# Patient Record
Sex: Female | Born: 1955
Health system: Southern US, Community
[De-identification: ages and names within clinical notes are randomized; demographics above are authoritative.]

## PROBLEM LIST (undated history)

## (undated) DIAGNOSIS — H409 Unspecified glaucoma: Secondary | ICD-10-CM

## (undated) DIAGNOSIS — E785 Hyperlipidemia, unspecified: Secondary | ICD-10-CM

## (undated) DIAGNOSIS — G4730 Sleep apnea, unspecified: Secondary | ICD-10-CM

## (undated) DIAGNOSIS — M2550 Pain in unspecified joint: Secondary | ICD-10-CM

## (undated) DIAGNOSIS — K219 Gastro-esophageal reflux disease without esophagitis: Secondary | ICD-10-CM

## (undated) DIAGNOSIS — I469 Cardiac arrest, cause unspecified: Secondary | ICD-10-CM

## (undated) DIAGNOSIS — R011 Cardiac murmur, unspecified: Secondary | ICD-10-CM

## (undated) DIAGNOSIS — F329 Major depressive disorder, single episode, unspecified: Secondary | ICD-10-CM

## (undated) DIAGNOSIS — Z9289 Personal history of other medical treatment: Secondary | ICD-10-CM

## (undated) DIAGNOSIS — I509 Heart failure, unspecified: Secondary | ICD-10-CM

## (undated) DIAGNOSIS — M1990 Unspecified osteoarthritis, unspecified site: Secondary | ICD-10-CM

## (undated) DIAGNOSIS — K5900 Constipation, unspecified: Secondary | ICD-10-CM

## (undated) DIAGNOSIS — R002 Palpitations: Secondary | ICD-10-CM

## (undated) DIAGNOSIS — I219 Acute myocardial infarction, unspecified: Secondary | ICD-10-CM

## (undated) DIAGNOSIS — Z8674 Personal history of sudden cardiac arrest: Secondary | ICD-10-CM

## (undated) DIAGNOSIS — F32A Depression, unspecified: Secondary | ICD-10-CM

## (undated) DIAGNOSIS — R609 Edema, unspecified: Secondary | ICD-10-CM

## (undated) DIAGNOSIS — I10 Essential (primary) hypertension: Secondary | ICD-10-CM

## (undated) DIAGNOSIS — M549 Dorsalgia, unspecified: Secondary | ICD-10-CM

## (undated) HISTORY — PX: BACK SURGERY: SHX140

## (undated) HISTORY — DX: Palpitations: R00.2

## (undated) HISTORY — PX: FOOT SURGERY: SHX648

## (undated) HISTORY — DX: Dorsalgia, unspecified: M54.9

## (undated) HISTORY — DX: Edema, unspecified: R60.9

## (undated) HISTORY — DX: Constipation, unspecified: K59.00

## (undated) HISTORY — PX: KNEE SURGERY: SHX244

## (undated) HISTORY — PX: HERNIA REPAIR: SHX51

## (undated) HISTORY — DX: Cardiac arrest, cause unspecified: I46.9

## (undated) HISTORY — DX: Pain in unspecified joint: M25.50

## (undated) HISTORY — DX: Personal history of other medical treatment: Z92.89

## (undated) HISTORY — DX: Heart failure, unspecified: I50.9

## (undated) HISTORY — PX: PARTIAL HYSTERECTOMY: SHX80

---

## 1998-02-01 ENCOUNTER — Encounter: Admission: RE | Admit: 1998-02-01 | Discharge: 1998-02-01 | Payer: Self-pay | Admitting: Hematology and Oncology

## 1998-05-24 ENCOUNTER — Emergency Department (HOSPITAL_COMMUNITY): Admission: EM | Admit: 1998-05-24 | Discharge: 1998-05-24 | Payer: Self-pay | Admitting: Emergency Medicine

## 1999-09-10 ENCOUNTER — Emergency Department (HOSPITAL_COMMUNITY): Admission: EM | Admit: 1999-09-10 | Discharge: 1999-09-10 | Payer: Self-pay | Admitting: Emergency Medicine

## 2004-03-07 ENCOUNTER — Encounter (HOSPITAL_COMMUNITY): Admission: RE | Admit: 2004-03-07 | Discharge: 2004-06-05 | Payer: Self-pay | Admitting: *Deleted

## 2004-04-16 ENCOUNTER — Emergency Department (HOSPITAL_COMMUNITY): Admission: EM | Admit: 2004-04-16 | Discharge: 2004-04-16 | Payer: Self-pay | Admitting: Emergency Medicine

## 2004-05-11 ENCOUNTER — Encounter: Admission: RE | Admit: 2004-05-11 | Discharge: 2004-07-19 | Payer: Self-pay | Admitting: Orthopaedic Surgery

## 2004-06-23 ENCOUNTER — Encounter: Admission: RE | Admit: 2004-06-23 | Discharge: 2004-06-23 | Payer: Self-pay | Admitting: Family Medicine

## 2004-07-04 ENCOUNTER — Encounter: Admission: RE | Admit: 2004-07-04 | Discharge: 2004-07-04 | Payer: Self-pay | Admitting: Gastroenterology

## 2004-07-12 ENCOUNTER — Encounter: Admission: RE | Admit: 2004-07-12 | Discharge: 2004-07-12 | Payer: Self-pay | Admitting: Gastroenterology

## 2004-08-14 ENCOUNTER — Observation Stay (HOSPITAL_COMMUNITY): Admission: RE | Admit: 2004-08-14 | Discharge: 2004-08-15 | Payer: Self-pay | Admitting: General Surgery

## 2004-08-14 ENCOUNTER — Encounter (INDEPENDENT_AMBULATORY_CARE_PROVIDER_SITE_OTHER): Payer: Self-pay | Admitting: *Deleted

## 2004-10-06 ENCOUNTER — Encounter: Admission: RE | Admit: 2004-10-06 | Discharge: 2005-01-04 | Payer: Self-pay | Admitting: Family Medicine

## 2004-11-07 ENCOUNTER — Ambulatory Visit (HOSPITAL_COMMUNITY): Admission: RE | Admit: 2004-11-07 | Discharge: 2004-11-07 | Payer: Self-pay | Admitting: *Deleted

## 2005-01-16 ENCOUNTER — Ambulatory Visit (HOSPITAL_COMMUNITY): Admission: RE | Admit: 2005-01-16 | Discharge: 2005-01-16 | Payer: Self-pay | Admitting: Family Medicine

## 2005-06-07 ENCOUNTER — Encounter: Admission: RE | Admit: 2005-06-07 | Discharge: 2005-06-07 | Payer: Self-pay | Admitting: Family Medicine

## 2005-06-27 ENCOUNTER — Encounter: Admission: RE | Admit: 2005-06-27 | Discharge: 2005-06-27 | Payer: Self-pay | Admitting: Family Medicine

## 2005-08-10 ENCOUNTER — Encounter (HOSPITAL_COMMUNITY): Admission: RE | Admit: 2005-08-10 | Discharge: 2005-08-30 | Payer: Self-pay | Admitting: *Deleted

## 2005-08-15 ENCOUNTER — Encounter: Admission: RE | Admit: 2005-08-15 | Discharge: 2005-08-15 | Payer: Self-pay | Admitting: *Deleted

## 2005-08-20 ENCOUNTER — Ambulatory Visit (HOSPITAL_COMMUNITY): Admission: RE | Admit: 2005-08-20 | Discharge: 2005-08-21 | Payer: Self-pay | Admitting: *Deleted

## 2006-04-25 ENCOUNTER — Encounter (INDEPENDENT_AMBULATORY_CARE_PROVIDER_SITE_OTHER): Payer: Self-pay | Admitting: Specialist

## 2006-04-25 ENCOUNTER — Ambulatory Visit (HOSPITAL_COMMUNITY): Admission: RE | Admit: 2006-04-25 | Discharge: 2006-04-25 | Payer: Self-pay | Admitting: Gastroenterology

## 2006-09-19 ENCOUNTER — Encounter: Admission: RE | Admit: 2006-09-19 | Discharge: 2006-09-19 | Payer: Self-pay | Admitting: Family Medicine

## 2008-02-05 ENCOUNTER — Ambulatory Visit (HOSPITAL_COMMUNITY): Admission: RE | Admit: 2008-02-05 | Discharge: 2008-02-05 | Payer: Self-pay | Admitting: Family Medicine

## 2008-02-05 ENCOUNTER — Encounter (INDEPENDENT_AMBULATORY_CARE_PROVIDER_SITE_OTHER): Payer: Self-pay | Admitting: Family Medicine

## 2008-02-05 ENCOUNTER — Ambulatory Visit: Payer: Self-pay | Admitting: Vascular Surgery

## 2008-09-04 ENCOUNTER — Emergency Department (HOSPITAL_COMMUNITY): Admission: EM | Admit: 2008-09-04 | Discharge: 2008-09-04 | Payer: Self-pay | Admitting: Family Medicine

## 2009-04-22 ENCOUNTER — Emergency Department (HOSPITAL_COMMUNITY): Admission: EM | Admit: 2009-04-22 | Discharge: 2009-04-23 | Payer: Self-pay | Admitting: Emergency Medicine

## 2009-09-02 ENCOUNTER — Emergency Department (HOSPITAL_COMMUNITY): Admission: EM | Admit: 2009-09-02 | Discharge: 2009-09-02 | Payer: Self-pay | Admitting: Emergency Medicine

## 2010-10-21 ENCOUNTER — Encounter: Payer: Self-pay | Admitting: *Deleted

## 2011-01-07 LAB — POCT CARDIAC MARKERS
CKMB, poc: 1.1 ng/mL (ref 1.0–8.0)
CKMB, poc: <1 ng/mL — ABNORMAL LOW (ref 1.0–8.0)
Myoglobin, poc: 22.4 ng/mL (ref 12–200)
Myoglobin, poc: 33.6 ng/mL (ref 12–200)
Troponin i, poc: <0.05 ng/mL (ref 0.00–0.09)
Troponin i, poc: <0.05 ng/mL (ref 0.00–0.09)

## 2011-01-07 LAB — POCT I-STAT, CHEM 8
BUN: 10 mg/dL (ref 6–23)
Calcium, Ion: 1.15 mmol/L (ref 1.12–1.32)
Chloride: 105 mEq/L (ref 96–112)
Creatinine, Ser: 0.5 mg/dL (ref 0.4–1.2)
Glucose, Bld: 149 mg/dL — ABNORMAL HIGH (ref 70–99)
HCT: 38 % (ref 36.0–46.0)
Hemoglobin: 12.9 g/dL (ref 12.0–15.0)
Potassium: 3.7 mEq/L (ref 3.5–5.1)
Sodium: 139 mEq/L (ref 135–145)
TCO2: 26 mmol/L (ref 0–100)

## 2011-02-16 NOTE — Op Note (Signed)
Whitney [name], Whitney [name]                ACCOUNT NO.:  [id number]   MEDICAL RECORD NO.:  [id number]          PATIENT TYPE:  AMB   LOCATION:  ENDO                         FACILITY:  [facility]   PHYSICIAN:  Petra Kuba, M.D.    DATE OF BIRTH:  24-Nov-1955   DATE OF PROCEDURE:  04/25/2006  DATE OF DISCHARGE:                                 OPERATIVE REPORT   PROCEDURE:  Colonoscopy with biopsy.   INDICATIONS:  Patient due for colonic screening.  Abdominal pain,  questionable etiology.  Mild anemia.  Consent was signed after risks,  benefits, methods, options were thoroughly discussed in the office on  multiple occasions.   MEDICINES USED PER ANESTHESIA:  1.  Diprivan in low dose.  2.  Fentanyl.  3.  Versed.   PROCEDURE:  Rectal inspection was pertinent for external hemorrhoids, small.  Digital exam was negative.  Video colonoscope was inserted, easily advanced  around the colon to the cecum.  This did not require any abdominal pressure  or any position changes.  No obvious abnormality was seen on insertion.  The  cecum was identified by the appendiceal orifice and the ileocecal valve.  In  fact, the scope was inserted a short ways in the terminal ileum, which was  normal.  Photodocumentation was obtained.  The scope was slowly withdrawn.  Prep was adequate.  There was some liquid stool that required suctioning  only.  On slow withdraw through the colon, other than a tiny descending  polyp which was cold biopsied x2 and put in the first container, and another  tiny polyp seen on retroflexion which was cold biopsied x1 and put in a  separate container, and the internal hemorrhoids seen on retroflexion, no  other abnormalities were seen.  Anorectal pullthrough and retroflexion did  confirm the small hemorrhoids.  The scope was straightened, readvanced a  short ways up the left side of the colon, air was suctioned, and the scope  removed.  The patient tolerated the procedure well.  There was no  obvious  immediate complication.   ENDOSCOPIC FINDINGS AND ASSESSMENT:  1.  Internal and external hemorrhoids, small.  2.  Two tiny polyps, cold biopsied, one in the rectum on retroflexion, one      in the descending mid.  3.  Otherwise within normal limits to the terminal ileum.   PLAN:  Await pathology to determine future colonic screening.  Continue  workup with a one-time EGD.  Please see that dictation for further workup,  recommendation, and plans.           ______________________________  Petra Kuba, M.D.     MEM/MEDQ  D:  04/25/2006  T:  04/25/2006  Job:  [id number]   cc:   Duncan Dull, M.D.  Fax: [phone]   Meade Maw, M.D.  Fax: [phone]

## 2011-02-16 NOTE — Op Note (Signed)
NAMELILLI, [name] NO.:  [id number]   MEDICAL RECORD NO.:  [id number]          PATIENT TYPE:  OBV   LOCATION:  0457                         [facility]ACILITY:  [facility]   PHYSICIAN:  Anselm Pancoast. Weatherly, M.D.DATE OF BIRTH:  December 11, 1955   DATE OF PROCEDURE:  08/14/2004  DATE OF DISCHARGE:                                 OPERATIVE REPORT   PREOPERATIVE DIAGNOSES:  1.  Umbilical hernia.  2.  Exogenous obesity.  3.  History of mechanical back problems.   POSTOPERATIVE DIAGNOSES:  1.  Umbilical hernia.  2.  Exogenous obesity.  3.  History of mechanical back problems.   PROCEDURE:  Repair of umbilical hernia.   ANESTHESIA:  General.   SURGEON:  Anselm Pancoast. Zachery Dakins, M.D.   ASSISTANT:  Gita Kudo, M.D.   INDICATIONS FOR PROCEDURE:  Whitney [name] is a 55 year old extremely  overweight female who weighs about 350 and has had an abdominal hysterectomy  through a  lower midline incision years ago but has an umbilical hernia that  is becoming more symptomatic and is not reducible.  I saw her in the office,  and she is medically disabled because of her obesity and back problems and  has limited activity.  I could not reduce the hernia.  Preoperatively her  lab studies are unremarkable.  Her O2 is at about 90% on room air.  Her EKG  and chest x-rays were unremarkable.  She does have a problem with sleep  apnea secondary to her obesity.  I recommended that we repair the hernia and  possibly use mesh which will be decided at surgery.   DESCRIPTION OF PROCEDURE:  Preoperatively, she was taken to the operative  suite for induction of general anesthesia and endotracheal tube.  PAS  stockings were not placed.  A Foley catheter.  The abdomen was then prepped  with Betadine surgical solution and draped in a sterile manner.   I opened up the top part of her lower midline incision and extended the  little curved portion around to the left and then above the umbilicus, and  then with the __________ for retraction, dissected out the hernia sac  circumferentially.  The hernia sac was about the size of an orange, but  fortunately, the fascia defect was only about a 50 cent size.  We basically  opened the hernia sac and freed up the chronic adhesions that were  preventing it from being reducible.  The little pedicles were ligated with 2-  0 Vicryl, and we then reduced the fatty contents and then basically closed  the peritoneum.  There was a little area that was incarcerated up just above  the fascia to the left that was probably what was giving her the discomfort,  and this was freed.  I then elected not to put the mesh in but just  basically closed with large #1 Novofil sutures, taking big bites starting  lateral on both sides.  About eight or nine sutures all total were placed.  Since the defect was not all that large, the fascia came together without  any excessive  tension.  The subcutaneous tissue was thoroughly irrigated.  I  placed a little Betadine solution in it and washed this out and then  actually closed the subcutaneous tissue vertically with 3-0 Vicryl and then  closed the skin with staples.   The patient tolerated the procedure nicely.  I did put about 15 cc of  Marcaine in the fascia immediately to hopefully minimize the postoperative  pain.  We will have her up in a chair this afternoon in the PAS stockings,  and hopefully she will be able to void in the morning after removal of the  Foley and then hopefully discharged tomorrow.  I am planning on giving her  another two doses of antibiotics, as the deep umbilicus possibly was not  cleansed as well as possible because of its depth.  A sterile occlusive  dressing was applied.      WJW/MEDQ  D:  08/14/2004  T:  08/14/2004  Job:  [id number]   cc:   Duncan Dull, M.D.  [address]  [place]  [place] [address]  Fax: [phone]

## 2011-02-16 NOTE — Op Note (Signed)
Whitney [name], Whitney [name]                ACCOUNT NO.:  [id number]   MEDICAL RECORD NO.:  [id number]          PATIENT TYPE:  AMB   LOCATION:  ENDO                         [facility]AC[facility]   PHYSICIAN:  Petra Kuba, M.D.    DATE OF BIRTH:  Jun 16, 1956   DATE OF PROCEDURE:  04/25/2006  DATE OF DISCHARGE:                                 OPERATIVE REPORT   PROCEDURE:  Esophagogastroduodenoscopy.   INDICATIONS:  Abdominal pain and anemia, nondiagnostic colonoscopy. Consent  was signed after risks, benefits, methods and options were thoroughly  discussed in the office and prior to any premeds given.   MEDICATIONS USE PER ANESTHESIA:  Diprivan, low dose fentanyl and Versed.   DESCRIPTION OF PROCEDURE:  The video endoscope was inserted by direct  vision.  Esophagus was normal.  She did have a small hiatal hernia with a  widely patent fibrous ring.  Scope passed into the stomach and advanced  through normal antrum, normal pylorus into a normal duodenal bulb and around  the C loop to normal second portion of the duodenum.  Scope was withdrawn  back into the bulb and a good look there ruled out abnormalities in that  location.  Scope was withdrawn back into the stomach and retroflexed.  Cardia, fundus, angularis, lesser and greater curve were evaluated on  retroflexed and then straight visualization without any obvious  abnormalities seen.  Air was suctioned, scope was slowly withdrawn again, a  good look at the esophagus was normal.  Scope was removed.  The patient  tolerated the procedure well.  There were no obvious immediate  complications.   ENDOSCOPIC DIAGNOSES:  1.  Small hiatal hernia with a widely patent ring.  2.  Otherwise normal esophagogastroduodenoscopy.   PLAN:  Per Dr. Kevan Ny.  Consider CAT scan or small bowel series to continue  workup if pain continues.  Happy to see back p.r.n..  Otherwise, see colon  dictation for other recommendations, workup and plans.     ______________________________  Petra Kuba, M.D.     MEM/MEDQ  D:  04/25/2006  T:  04/25/2006  Job:  [id number]   cc:   Duncan Dull, M.D.  Fax: [phone]   Meade Maw, M.D.  Fax: [phone]

## 2011-02-16 NOTE — Cardiovascular Report (Signed)
Whitney, [name] NO.:  [id number]   MEDICAL RECORD NO.:  [id number]          PATIENT TYPE:  OIB   LOCATION:  6524                         FACILITY:  [facility]   PHYSICIAN:  Meade Maw, M.D.    DATE OF BIRTH:  [birth year]   DATE OF PROCEDURE:  08/20/2005  DATE OF DISCHARGE:                              CARDIAC CATHETERIZATION   REFERRING PHYSICIAN:  [facility].   INDICATIONS FOR PROCEDURE:  Ongoing chest pain.   HISTORY:  Whitney [name] is a very morbidly obese female who has been difficult to  obtain a noninvasive study. Recent stress Cardiolite demonstrating possible  anterior ischemia versus breast artifact.   PROCEDURE:  After obtaining written informed consent, the patient was  brought to the cardiac catheterization lab in the post absorptive state.  Preoperative sedation was achieved using Versed 2 milligrams IV. The patient  was also given Solu-Medrol 125 milligrams IV x1 and Benadryl 25 milligrams  p.o. for possible IV contrast allergies.   The right groin was prepped and draped in the usual sterile fashion. Local  anesthesia was achieved using 1% Xylocaine. A 6-French hemostasis sheath was  placed into the right femoral artery using modified Seldinger technique.  Selective coronary angiography was performed using JL-4, JR-4 Judkins  catheter. All catheter exchanges were made over guidewire. The hemostasis  sheath was flushed following each catheter exchange. Of note, a Smart needle  was used to obtain access into the right femoral artery. There was no  immediate complications. The patient was given labetalol 10 milligrams IV x2  for blood pressure control.   FINDINGS:  The aortic pressure was 186/90, LV pressure is 188/9. The EDP was  12. Single-plane ventriculogram revealed mild hypokinesis, ejection fraction  of 50-55%.   CORONARY ANGIOGRAPHY:  1.  Left main coronary artery bifurcates into the left anterior descending      and  circumflex vessel. There was no disease noted in the left main      coronary artery.  2.  Left anterior descending:  Left anterior descending gives rise to a      small to moderate D1, small D2, large D3. It goes on to end as an apical      branch. There was no disease noted in the left anterior descending or      its branches.  3.  Circumflex vessel:  Circumflex vessel was large-caliber vessel giving      rise to a large OM-1 and a large OM-2. There was no disease noted in the      circumflex or its branches.  4.  The right coronary artery:  The right coronary artery is a large artery      dominant for the posterior circulation. It gives rise to two RV      marginals, PDA and PL branch. There is no significant disease noted in      the right coronary artery or its branches.   FINAL IMPRESSION:  Normal coronary angiography and normal single-plane  ventriculogram.   RECOMMENDATIONS:  To consider other etiologies for her chest pain.  Meade Maw, M.D.  Electronically Signed     HP/MEDQ  D:  08/20/2005  T:  08/21/2005  Job:  [id number]   cc:   Duncan Dull, M.D.  Fax: [phone]

## 2011-02-16 NOTE — H&P (Signed)
NAMEIMONI, [name]:  [id number]   MEDICAL RECORD NO.:  [id number]          PATIENT TYPE:  OBV   LOCATION:  0098                         [facility]ACILITY:  [facility]   PHYSICIAN:  Anselm Pancoast. Weatherly, M.D.DATE OF BIRTH:  [birth year]   DATE OF ADMISSION:  08/14/2004  DATE OF DISCHARGE:                                HISTORY & PHYSICAL   CHIEF COMPLAINT:  Umbilical hernia.   HISTORY OF PRESENT ILLNESS:  Whitney [name] is a 55 year old medically  disabled because of obesity and back problems female who was referred to our  office by Shaune Pollack for a symptomatic umbilical hernia.  The patient has  had a previous lower abdominal incision for hysterectomy years ago and has  had previous back surgeries for herniated disks.  She has a history of  hypertension.  She used to work in data entry but would have episodes of  kind of falling asleep which was thought to be related to sleep apnea.  For  this reason, her back problems and knees, she was medically disable.  She  has tried to loose weight.  She possibly has lost about 20 pounds.   CURRENT MEDICATIONS:  1.  Lipitor 10 mg a day.  2.  Reserpine 1 mg a day.  3.  Hydrochlorothiazide 25 mg.  4.  Protonix 50 mg.  5.  Baby aspirin.   FAMILY HISTORY:  Her father is deceased.  Her mother is living with diabetes  and is also overweight.  She has brothers and sisters who have had strokes  and cardiac problems and then one sister who is living and in good health.   SOCIAL HISTORY:  The patient is separated from her husband.  She has no  children.  She is actually living now with her mother.   REVIEW OF SYSTEMS:  Please refer to the history and physical from the office  on the nurse's records for other review of symptoms.   ALLERGIES:  NONE NOTED.   PHYSICAL EXAMINATION:  GENERAL:  She is a pleasant but extremely overweight  female, about 5 feet 3 inches, in no acute distress.  VITAL SIGNS:  Blood pressure today is 146/63, 5  feet 3 inches, weight 320  pounds, heart rate 77, temperature 97.2, respirations 20.  HEENT:  No acute problems, well-hydrated.  NECK:  No cervical or supraclavicular adenopathy.  LUNGS:  Good breath sounds bilaterally.  CARDIAC:  Normal sinus rhythm.  BREASTS:  Negative.  ABDOMEN:  Very protuberant abdomen.  Lower midline incision.  About an  orange-size mass at the umbilicus that protrudes when she strains.  Rectal  examination was unremarkable in the office.  EXTREMITIES:  Extremely overweight but no evidence of any obvious pedal  edema or tenderness in the legs.  BACK:  I did not do a complete neurologic exam.  She is ambulatory.  Whether  it is her back or her weight that makes her limited to activity is in  question.  SKIN:  Unremarkable.   ADMISSION IMPRESSION:  1.  Umbilical hernia.  2.  Obesity.  3.  History of hypertension.  4.  Mechanical back problems.   PLAN:  The patient will have repair of this umbilical hernia under general  anesthesia and will be kept overnight because of her sleep apnea, etc., but  hopefully she can be released in the morning.  We will resume her blood  pressure medications postoperatively.      WJW/MEDQ  D:  08/14/2004  T:  08/14/2004  Job:  [id number]

## 2011-07-06 LAB — POCT INFECTIOUS MONO SCREEN: Mono Screen: NEGATIVE

## 2011-10-02 DIAGNOSIS — I219 Acute myocardial infarction, unspecified: Secondary | ICD-10-CM

## 2011-10-02 HISTORY — DX: Acute myocardial infarction, unspecified: I21.9

## 2011-10-25 DIAGNOSIS — I8310 Varicose veins of unspecified lower extremity with inflammation: Secondary | ICD-10-CM | POA: Diagnosis not present

## 2011-11-02 DIAGNOSIS — Z8674 Personal history of sudden cardiac arrest: Secondary | ICD-10-CM

## 2011-11-02 DIAGNOSIS — H4011X0 Primary open-angle glaucoma, stage unspecified: Secondary | ICD-10-CM | POA: Diagnosis not present

## 2011-11-02 DIAGNOSIS — H409 Unspecified glaucoma: Secondary | ICD-10-CM | POA: Diagnosis not present

## 2011-11-02 HISTORY — DX: Personal history of sudden cardiac arrest: Z86.74

## 2011-11-05 ENCOUNTER — Inpatient Hospital Stay (HOSPITAL_COMMUNITY)
Admission: EM | Admit: 2011-11-05 | Discharge: 2011-11-15 | DRG: 208 | Disposition: A | Discharge status: Other Acute Inpatient Hospital | Payer: Medicare Other | Attending: Internal Medicine | Admitting: Internal Medicine

## 2011-11-05 ENCOUNTER — Emergency Department (HOSPITAL_COMMUNITY): Payer: Medicare Other

## 2011-11-05 ENCOUNTER — Other Ambulatory Visit: Payer: Self-pay

## 2011-11-05 ENCOUNTER — Encounter (HOSPITAL_COMMUNITY)
Admission: EM | Disposition: A | Discharge status: Nursing Home (Includes ICF) | Payer: Self-pay | Source: Home / Self Care | Attending: Pulmonary Disease

## 2011-11-05 ENCOUNTER — Ambulatory Visit (HOSPITAL_COMMUNITY): Admit: 2011-11-05 | Payer: Self-pay | Admitting: Cardiology

## 2011-11-05 ENCOUNTER — Encounter (HOSPITAL_COMMUNITY): Payer: Self-pay | Admitting: *Deleted

## 2011-11-05 DIAGNOSIS — E872 Acidosis, unspecified: Secondary | ICD-10-CM | POA: Diagnosis not present

## 2011-11-05 DIAGNOSIS — D72829 Elevated white blood cell count, unspecified: Secondary | ICD-10-CM

## 2011-11-05 DIAGNOSIS — R079 Chest pain, unspecified: Secondary | ICD-10-CM | POA: Diagnosis not present

## 2011-11-05 DIAGNOSIS — E876 Hypokalemia: Secondary | ICD-10-CM | POA: Diagnosis not present

## 2011-11-05 DIAGNOSIS — IMO0001 Reserved for inherently not codable concepts without codable children: Secondary | ICD-10-CM | POA: Diagnosis present

## 2011-11-05 DIAGNOSIS — L89899 Pressure ulcer of other site, unspecified stage: Secondary | ICD-10-CM | POA: Diagnosis present

## 2011-11-05 DIAGNOSIS — I469 Cardiac arrest, cause unspecified: Secondary | ICD-10-CM | POA: Diagnosis not present

## 2011-11-05 DIAGNOSIS — L8992 Pressure ulcer of unspecified site, stage 2: Secondary | ICD-10-CM | POA: Diagnosis present

## 2011-11-05 DIAGNOSIS — IMO0002 Reserved for concepts with insufficient information to code with codable children: Secondary | ICD-10-CM | POA: Diagnosis present

## 2011-11-05 DIAGNOSIS — I5023 Acute on chronic systolic (congestive) heart failure: Secondary | ICD-10-CM | POA: Diagnosis present

## 2011-11-05 DIAGNOSIS — R0902 Hypoxemia: Secondary | ICD-10-CM | POA: Diagnosis present

## 2011-11-05 DIAGNOSIS — R5381 Other malaise: Secondary | ICD-10-CM | POA: Diagnosis not present

## 2011-11-05 DIAGNOSIS — Z09 Encounter for follow-up examination after completed treatment for conditions other than malignant neoplasm: Secondary | ICD-10-CM | POA: Diagnosis not present

## 2011-11-05 DIAGNOSIS — K7200 Acute and subacute hepatic failure without coma: Secondary | ICD-10-CM | POA: Diagnosis not present

## 2011-11-05 DIAGNOSIS — L89009 Pressure ulcer of unspecified elbow, unspecified stage: Secondary | ICD-10-CM | POA: Diagnosis present

## 2011-11-05 DIAGNOSIS — I2789 Other specified pulmonary heart diseases: Secondary | ICD-10-CM | POA: Diagnosis present

## 2011-11-05 DIAGNOSIS — E662 Morbid (severe) obesity with alveolar hypoventilation: Secondary | ICD-10-CM | POA: Diagnosis present

## 2011-11-05 DIAGNOSIS — J810 Acute pulmonary edema: Secondary | ICD-10-CM | POA: Diagnosis not present

## 2011-11-05 DIAGNOSIS — L8991 Pressure ulcer of unspecified site, stage 1: Secondary | ICD-10-CM | POA: Diagnosis present

## 2011-11-05 DIAGNOSIS — I10 Essential (primary) hypertension: Secondary | ICD-10-CM | POA: Diagnosis present

## 2011-11-05 DIAGNOSIS — G4733 Obstructive sleep apnea (adult) (pediatric): Secondary | ICD-10-CM | POA: Diagnosis present

## 2011-11-05 DIAGNOSIS — G9349 Other encephalopathy: Secondary | ICD-10-CM | POA: Diagnosis present

## 2011-11-05 DIAGNOSIS — J90 Pleural effusion, not elsewhere classified: Secondary | ICD-10-CM | POA: Diagnosis not present

## 2011-11-05 DIAGNOSIS — J9692 Respiratory failure, unspecified with hypercapnia: Secondary | ICD-10-CM | POA: Diagnosis present

## 2011-11-05 DIAGNOSIS — Z7982 Long term (current) use of aspirin: Secondary | ICD-10-CM

## 2011-11-05 DIAGNOSIS — Z79899 Other long term (current) drug therapy: Secondary | ICD-10-CM

## 2011-11-05 DIAGNOSIS — R918 Other nonspecific abnormal finding of lung field: Secondary | ICD-10-CM | POA: Diagnosis not present

## 2011-11-05 DIAGNOSIS — J9620 Acute and chronic respiratory failure, unspecified whether with hypoxia or hypercapnia: Principal | ICD-10-CM | POA: Diagnosis present

## 2011-11-05 DIAGNOSIS — E1165 Type 2 diabetes mellitus with hyperglycemia: Secondary | ICD-10-CM | POA: Diagnosis present

## 2011-11-05 DIAGNOSIS — Z6841 Body Mass Index (BMI) 40.0 and over, adult: Secondary | ICD-10-CM

## 2011-11-05 DIAGNOSIS — I509 Heart failure, unspecified: Secondary | ICD-10-CM | POA: Diagnosis present

## 2011-11-05 HISTORY — DX: Essential (primary) hypertension: I10

## 2011-11-05 LAB — POCT I-STAT, CHEM 8
BUN: 15 mg/dL (ref 6–23)
Calcium, Ion: 1.26 mmol/L (ref 1.12–1.32)
Creatinine, Ser: 0.6 mg/dL (ref 0.50–1.10)
Glucose, Bld: 369 mg/dL — ABNORMAL HIGH (ref 70–99)
Glucose, Bld: 388 mg/dL — ABNORMAL HIGH (ref 70–99)
HCT: 45 % (ref 36.0–46.0)
Hemoglobin: 15.3 g/dL — ABNORMAL HIGH (ref 12.0–15.0)
Hemoglobin: 15.6 g/dL — ABNORMAL HIGH (ref 12.0–15.0)
Potassium: 3.8 mEq/L (ref 3.5–5.1)
TCO2: 22 mmol/L (ref 0–100)

## 2011-11-05 LAB — URINALYSIS, ROUTINE W REFLEX MICROSCOPIC
Bilirubin Urine: NEGATIVE
Ketones, ur: NEGATIVE mg/dL
Nitrite: NEGATIVE
Specific Gravity, Urine: 1.021 (ref 1.005–1.030)
Urobilinogen, UA: 1 mg/dL (ref 0.0–1.0)

## 2011-11-05 LAB — POCT I-STAT 3, ART BLOOD GAS (G3+)
Acid-base deficit: 18 mmol/L — ABNORMAL HIGH (ref 0.0–2.0)
Patient temperature: 98

## 2011-11-05 LAB — CBC
HCT: 34.7 % — ABNORMAL LOW (ref 36.0–46.0)
Hemoglobin: 11.2 g/dL — ABNORMAL LOW (ref 12.0–15.0)
MCV: 81.8 fL (ref 78.0–100.0)
WBC: 14.9 10*3/uL — ABNORMAL HIGH (ref 4.0–10.5)

## 2011-11-05 LAB — COMPREHENSIVE METABOLIC PANEL
AST: 45 U/L — ABNORMAL HIGH (ref 0–37)
BUN: 14 mg/dL (ref 6–23)
CO2: 29 mEq/L (ref 19–32)
Chloride: 105 mEq/L (ref 96–112)
Creatinine, Ser: 0.52 mg/dL (ref 0.50–1.10)
GFR calc Af Amer: >90 mL/min (ref >90)
GFR calc non Af Amer: >90 mL/min (ref >90)
Glucose, Bld: 201 mg/dL — ABNORMAL HIGH (ref 70–99)
Total Bilirubin: 0.8 mg/dL (ref 0.3–1.2)

## 2011-11-05 LAB — DIFFERENTIAL
Basophils Absolute: 0 10*3/uL (ref 0.0–0.1)
Lymphocytes Relative: 15 % (ref 12–46)
Lymphs Abs: 2.2 10*3/uL (ref 0.7–4.0)
Monocytes Absolute: 0.8 10*3/uL (ref 0.1–1.0)
Monocytes Relative: 5 % (ref 3–12)
Neutro Abs: 11.8 10*3/uL — ABNORMAL HIGH (ref 1.7–7.7)

## 2011-11-05 LAB — POCT I-STAT TROPONIN I: Troponin i, poc: 0.05 ng/mL (ref 0.00–0.08)

## 2011-11-05 LAB — CK TOTAL AND CKMB (NOT AT ARMC): CK, MB: 1.7 ng/mL (ref 0.3–4.0)

## 2011-11-05 LAB — APTT: aPTT: 34 seconds (ref 24–37)

## 2011-11-05 SURGERY — LEFT HEART CATHETERIZATION WITH CORONARY ANGIOGRAM
Anesthesia: LOCAL

## 2011-11-05 MED ORDER — IOHEXOL 300 MG/ML  SOLN
100.0000 mL | Freq: Once | INTRAMUSCULAR | Status: AC | PRN
  Administered 2011-11-05: 100 mL via INTRAVENOUS

## 2011-11-05 MED ORDER — FUROSEMIDE 10 MG/ML IJ SOLN
80.0000 mg | Freq: Once | INTRAMUSCULAR | Status: AC
  Administered 2011-11-05: 40 mg via INTRAVENOUS

## 2011-11-05 MED ORDER — ASPIRIN 300 MG RE SUPP
300.0000 mg | Freq: Once | RECTAL | Status: AC
  Administered 2011-11-05: 300 mg via RECTAL

## 2011-11-05 MED ORDER — VECURONIUM BROMIDE 10 MG IV SOLR
INTRAVENOUS | Status: AC
  Filled 2011-11-05: qty 10

## 2011-11-05 MED ORDER — MIDAZOLAM HCL 5 MG/ML IJ SOLN
1.0000 mg/h | INTRAMUSCULAR | Status: DC
  Administered 2011-11-05: 2 mg/h via INTRAVENOUS
  Filled 2011-11-05: qty 10

## 2011-11-05 MED ORDER — NITROGLYCERIN IN D5W 200-5 MCG/ML-% IV SOLN
INTRAVENOUS | Status: AC
  Administered 2011-11-05: 10 ug/min
  Filled 2011-11-05: qty 250

## 2011-11-05 MED ORDER — FUROSEMIDE 10 MG/ML IJ SOLN
INTRAMUSCULAR | Status: AC
  Administered 2011-11-05: 40 mg via INTRAVENOUS
  Filled 2011-11-05: qty 8

## 2011-11-05 MED ORDER — MIDAZOLAM HCL 2 MG/2ML IJ SOLN: 2.0000 mg | Freq: Once | INTRAMUSCULAR | Status: DC

## 2011-11-05 MED ORDER — FENTANYL CITRATE 0.05 MG/ML IJ SOLN: 50.0000 ug | INTRAMUSCULAR | Status: DC | PRN

## 2011-11-05 MED ORDER — FUROSEMIDE 10 MG/ML IJ SOLN
40.0000 mg | Freq: Once | INTRAMUSCULAR | Status: AC
  Administered 2011-11-05: 40 mg via INTRAVENOUS

## 2011-11-05 MED ORDER — ASPIRIN 300 MG RE SUPP
325.0000 mg | Freq: Once | RECTAL | Status: DC
  Filled 2011-11-05: qty 1

## 2011-11-05 MED ORDER — EPINEPHRINE HCL 0.1 MG/ML IJ SOLN
INTRAMUSCULAR | Status: DC | PRN
  Administered 2011-11-05 (×2): 1 mg via INTRAVENOUS

## 2011-11-05 MED ORDER — FENTANYL CITRATE 0.05 MG/ML IJ SOLN
10.0000 ug/h | INTRAMUSCULAR | Status: DC
  Administered 2011-11-05: 40 ug/h via INTRAVENOUS
  Filled 2011-11-05: qty 50

## 2011-11-05 MED ORDER — MIDAZOLAM HCL 2 MG/2ML IJ SOLN: 2.0000 mg | INTRAMUSCULAR | Status: DC | PRN

## 2011-11-05 MED ORDER — SODIUM CHLORIDE 0.9 % IV SOLN
Freq: Once | INTRAVENOUS | Status: AC
  Administered 2011-11-05: 23:00:00 via INTRAVENOUS

## 2011-11-05 MED ORDER — SODIUM CHLORIDE 0.9 % IV SOLN
0.5000 ug/kg/min | INTRAVENOUS | Status: DC
  Administered 2011-11-05: 1 ug/kg/min via INTRAVENOUS
  Filled 2011-11-05: qty 20

## 2011-11-05 MED ORDER — MIDAZOLAM HCL 2 MG/2ML IJ SOLN
INTRAMUSCULAR | Status: AC
  Filled 2011-11-05: qty 2

## 2011-11-05 MED ORDER — VECURONIUM BROMIDE 10 MG IV SOLR
10.0000 mg | Freq: Once | INTRAVENOUS | Status: AC
  Administered 2011-11-05: 10 mg via INTRAVENOUS

## 2011-11-05 NOTE — ED Notes (Signed)
Pt c/o increasing SOB over the past couple days.  States today noticed increasing SOB, unable to walk far without feeling SOB.  Was seen at clinic today and states O2 sats were 81% on RA.  Sent over for evaluation of PE.  Also states has had vein surgery in left leg in the "past couple months".  C/o some pain in left leg and in chest currently.

## 2011-11-05 NOTE — ED Provider Notes (Addendum)
History     CSN: [id number]  Arrival date & time 11/05/11  2035   First MD Initiated Contact with Patient 11/05/11 2056      Chief Complaint  Patient presents with  . r/o pe     HPI Patient presents to emergency room with complaints of chest pain and shortness of breath. Patient states she had a pain injection on January 24 2 left leg. Few days after that she started noticing she was having some congestion in her chest and difficulty with her breathing. She is also having some pain in her chest as well that would increase with deep breathing. Patient noted that whenever she tried to exert herself she would begin feeling short of breath and felt like she couldn't even do her simple activities of going to the store without getting very dyspneic. Patient has not had any fevers. She has had a cough. She has no known history of heart disease. She does have borderline  diabetes associated with her obesity but does not take any medication. Patient saw her doctor today and had a chest x-ray. She was told to come emergently make sure she does not have a pulmonary embolism. Past Medical History  Diagnosis Date  . Diabetes mellitus   . Hypertension     History reviewed. No pertinent past surgical history.  History reviewed. No pertinent family history.  History  Substance Use Topics  . Smoking status: Never Smoker   . Smokeless tobacco: Not on file  . Alcohol Use: No    OB History    Grav Para Term Preterm Abortions TAB SAB Ect Mult Living                  Review of Systems  All other systems reviewed and are negative.    Allergies  Review of patient's allergies indicates not on file.  Home Medications  No current outpatient prescriptions on file.  BP 163/108  Pulse 107  Resp 28  SpO2 96%  Physical Exam  Nursing note and vitals reviewed. Constitutional: No distress.       Obese  HENT:  Head: Normocephalic and atraumatic.  Right Ear: External ear normal.  Left Ear:  External ear normal.  Eyes: Conjunctivae are normal. Right eye exhibits no discharge. Left eye exhibits no discharge. No scleral icterus.  Neck: Neck supple. No tracheal deviation present.  Cardiovascular: Normal rate, regular rhythm and intact distal pulses.   Pulmonary/Chest: Effort normal. No stridor. No respiratory distress. She has wheezes. She has rales (few wheezes and rales on the right side).  Abdominal: Soft. Bowel sounds are normal. She exhibits no distension. There is no tenderness. There is no rebound and no guarding.  Musculoskeletal: She exhibits edema. She exhibits no tenderness.  Neurological: She is alert. She has normal strength. No cranial nerve deficit ( no gross defecits noted) or sensory deficit. She exhibits normal muscle tone. She displays no seizure activity. Coordination normal.  Skin: Skin is warm and dry. No rash noted.  Psychiatric: She has a normal mood and affect.    ED Course  Procedures (including critical care time)  Date: 11/05/2011  Rate: 99  Rhythm: normal sinus rhythm  QRS Axis: normal  Intervals: normal except prolonged QT  ST/T Wave abnormalities: nonspecific T wave changes  Conduction Disutrbances:none  Narrative Interpretation: Left ventricular hypertrophy  Old EKG Reviewed: changes noted nonspecific T wave changes and prolonged QT are new   Labs Reviewed  CBC - Abnormal; Notable for the following:  WBC 14.9 (*)    Hemoglobin 11.2 (*)    HCT 34.7 (*)    All other components within normal limits  DIFFERENTIAL - Abnormal; Notable for the following:    Neutrophils Relative 79 (*)    Neutro Abs 11.8 (*)    All other components within normal limits  COMPREHENSIVE METABOLIC PANEL - Abnormal; Notable for the following:    Glucose, Bld 201 (*)    AST 45 (*)    ALT 59 (*)    Alkaline Phosphatase 150 (*)    All other components within normal limits  POCT I-STAT, CHEM 8 - Abnormal; Notable for the following:    Glucose, Bld 388 (*)     Hemoglobin 15.3 (*)    All other components within normal limits  POCT I-STAT 3, BLOOD GAS (G3+) - Abnormal; Notable for the following:    pH, Arterial 6.882 (*)    pCO2 arterial 93.9 (*)    pO2, Arterial 58.0 (*)    Bicarbonate 17.8 (*)    Acid-base deficit 18.0 (*)    All other components within normal limits  POCT I-STAT, CHEM 8 - Abnormal; Notable for the following:    Potassium 3.4 (*)    Glucose, Bld 369 (*)    Hemoglobin 15.6 (*)    All other components within normal limits  CK TOTAL AND CKMB  PROTIME-INR  APTT  TROPONIN I  POCT I-STAT TROPONIN I  PRO B NATRIURETIC PEPTIDE  URINALYSIS, ROUTINE W REFLEX MICROSCOPIC   Ct Angio Chest W/cm &/or Wo Cm  11/05/2011  *RADIOLOGY REPORT*  Clinical Data: Chest pain, shortness of breath  CT ANGIOGRAPHY CHEST  Technique:  Multidetector CT imaging of the chest using the standard protocol during bolus administration of intravenous contrast. Multiplanar reconstructed images including MIPs were obtained and reviewed to evaluate the vascular anatomy.  Contrast: OMNIPAQUE IOHEXOL 300 MG/ML IV SOLN . After contrast administration the patient became   severely short of breath, dizzy, code called, CPR started, the patient transferred to the ED.  Comparison: 01/16/2005  Findings: There is fairly good contrast opacification of pulmonary artery branches.  Images are degraded by body habitus and patient breathing during the acquisition.  No convincing filling defects are identified to suggest acute PE.  There is minimal contrast opacification of the thoracic aorta.  There are small pleural effusions, right greater than left.  No pericardial effusion. There is reflux of contrast from the right atrium into the hepatic veins suggesting right heart failure.  No hilar or mediastinal adenopathy.  Calcified left thyroid lesion is noted.  There are extensive ground-glass and airspace opacities throughout both lungs in a predominately perihilar distribution.   Flowing osteophytes in the mid and lower thoracic spine.  IMPRESSION:  1.  Extensive bilateral airspace edema or infiltrates. 2.  Small bilateral pleural effusions. 3.  No definite pulmonary emboli, with the exam limitations as above.  I telephoned the critical test results to Dr. Lynelle Doctor at the time of interpretation.  Original Report Authenticated By: Osa Craver, M.D.   Dg Chest Port 1 View  11/05/2011  *RADIOLOGY REPORT*  Clinical Data: CPR, intubated  PORTABLE CHEST - 1 VIEW  Comparison: Early exam the same day  Findings: An endotracheal tube has been placed 2 cm into the right mainstem bronchus.  There are extensive bilateral airspace opacities with air bronchograms seen centrally.  Cannot exclude pleural effusions.  Heart size difficult to assess due to adjacent opacities.  Regional bones unremarkable.  IMPRESSION:  1.  Low position of the endotracheal tube into the right mainstem bronchus, should be retracted. I telephoned the critical test results to Dr. Lynelle Doctor at the time of interpretation. 2.  Extensive bilateral airspace edema or infiltrates.  Original Report Authenticated By: Osa Craver, M.D.     1. Cardiac arrest      MDM  Pt sent over to rule out PE.  Pt had been having shortness of breath.  While getting her CT the patient went into cardiac arrest.  Pt underwent 10 minutes of CPR.  She was given 2 doses of epi and intubated.   Intubation peformed by Dr Loretha Stapler.  I was at the bedside.  She now is in a sinus rhythm.  Please see flow sheet for additional info.  Cardiology and critical care have been called.  We will initiate cooling protocol.  Preliminary CT report no PE.    11:37 PM Dr. [place] has been down to the emergency room in to evaluate the patient. Her EKG following arrest showed ST elevation in lead 1 and aVL but the followup EKG shows resolution of that. This time we'll continue to manage her and stabilize her foot there is no plans for emergent cardiac cath  at this time.  Patient continues significant pulmonary edema an ET tube.   Patient has been given Lasix IV and a nitroglycerin drip has been initiated. Patient admitted to pulmonary critical care.  CRITICAL CARE Performed by: Celene Kras   Total critical care time: 45  Critical care time was exclusive of separately billable procedures and treating other patients.  Critical care was necessary to treat or prevent imminent or life-threatening deterioration.  Critical care was time spent personally by me on the following activities: development of treatment plan with patient and/or surrogate as well as nursing, discussions with consultants, evaluation of patient's response to treatment, examination of patient, obtaining history from patient or surrogate, ordering and performing treatments and interventions, ordering and review of laboratory studies, ordering and review of radiographic studies, pulse oximetry and re-evaluation of patient's condition.    11:42 PM the ET tube was retracted following the portable chest x-ray    Celene Kras, MD 11/05/11 7846  Celene Kras, MD 11/05/11 2342

## 2011-11-05 NOTE — Code Documentation (Signed)
Chaplain called.  

## 2011-11-05 NOTE — Consult Note (Signed)
Cardiology Consult Note  No ref. provider found Reason for consult: s/p cardiopulmonary arrest  History of Present Illness (and review of medical records): Whitney [name] is a 56 y.o. female who presents for evaluation of shortness of breath.  She initally presented to urgent care center and was transferred here for further evaluation.  There was an initial concern for PE.  She was able to undergo CT.  While in ED patient had witnessed arrest.  Appeared to be PEA and ACLS was initated.  This continued for approx until ROSC.  She was intubated at that time.  As there was concern for ekg changes at the time of arrest, cardiology was consulted for further assistance with management.   Past Medical History  Diagnosis Date  . Diabetes mellitus   . Hypertension     History reviewed. No pertinent past surgical history.   (Not in a hospital admission) No Known Allergies  History  Substance Use Topics  . Smoking status: Never Smoker   . Smokeless tobacco: Not on file  . Alcohol Use: No    History reviewed. No pertinent family history.   Review of Systems Unobtainable secondary to patient mental status  Objective:  Patient Vitals for the past 8 hrs:  BP Temp Temp src Pulse Resp SpO2 Weight  11/05/11 2325 - - - 130  28  27 % -  11/05/11 2316 181/91 mmHg - - 135  28  52 % -  11/05/11 2300 - - - 123  26  46 % 100 kg (220 lb 7.4 oz)  11/05/11 2215 166/96 mmHg - - 89  - 96 % -  11/05/11 2200 169/112 mmHg - - 79  24  99 % -  11/05/11 2155 110/59 mmHg - - 82  29  98 % -  11/05/11 2145 64/42 mmHg - - 84  33  97 % -  11/05/11 2139 - - - - - 98 % -  11/05/11 2117 173/92 mmHg 98 F (36.7 C) Oral - 15  95 % -  11/05/11 2050 163/108 mmHg - - - - 96 % -  11/05/11 2046 - - - 107  28  91 % -  11/05/11 2043 - - - - 70  - -   General Appearance:    Morbidly obese female, intubated   Head:    Normocephalic, without obvious abnormality, atraumatic  Eyes:     PERRL, EOMI, anicteric sclerae       Lungs:     Coarse breath sounds  Heart:    Distant heart sounds, tachycardic  Abdomen:     Large, obese,  Extremities:   Chronic venous statis changes  Pulses:   1+     Neurologic:   sedated   Results for orders placed during the hospital encounter of 11/05/11 (from the past 48 hour(s))  CBC     Status: Abnormal   Collection Time   11/05/11  9:01 PM      Component Value Range Comment   WBC 14.9 (*) 4.0 - 10.5 (K/uL)    RBC 4.24  3.87 - 5.11 (MIL/uL)    Hemoglobin 11.2 (*) 12.0 - 15.0 (g/dL)    HCT 16.1 (*) 09.6 - 46.0 (%)    MCV 81.8  78.0 - 100.0 (fL)    MCH 26.4  26.0 - 34.0 (pg)    MCHC 32.3  30.0 - 36.0 (g/dL)    RDW 04.5  40.9 - 81.1 (%)    Platelets 299  150 - 400 (K/uL)   DIFFERENTIAL     Status: Abnormal   Collection Time   11/05/11  9:01 PM      Component Value Range Comment   Neutrophils Relative 79 (*) 43 - 77 (%)    Neutro Abs 11.8 (*) 1.7 - 7.7 (K/uL)    Lymphocytes Relative 15  12 - 46 (%)    Lymphs Abs 2.2  0.7 - 4.0 (K/uL)    Monocytes Relative 5  3 - 12 (%)    Monocytes Absolute 0.8  0.1 - 1.0 (K/uL)    Eosinophils Relative 0  0 - 5 (%)    Eosinophils Absolute 0.0  0.0 - 0.7 (K/uL)    Basophils Relative 0  0 - 1 (%)    Basophils Absolute 0.0  0.0 - 0.1 (K/uL)   COMPREHENSIVE METABOLIC PANEL     Status: Abnormal   Collection Time   11/05/11  9:01 PM      Component Value Range Comment   Sodium 141  135 - 145 (mEq/L)    Potassium 3.8  3.5 - 5.1 (mEq/L)    Chloride 105  96 - 112 (mEq/L)    CO2 29  19 - 32 (mEq/L)    Glucose, Bld 201 (*) 70 - 99 (mg/dL)    BUN 14  6 - 23 (mg/dL)    Creatinine, Ser 1.61  0.50 - 1.10 (mg/dL)    Calcium 9.5  8.4 - 10.5 (mg/dL)    Total Protein 7.3  6.0 - 8.3 (g/dL)    Albumin 3.5  3.5 - 5.2 (g/dL)    AST 45 (*) 0 - 37 (U/L)    ALT 59 (*) 0 - 35 (U/L)    Alkaline Phosphatase 150 (*) 39 - 117 (U/L)    Total Bilirubin 0.8  0.3 - 1.2 (mg/dL)    GFR calc non Af Amer >90  >90 (mL/min)    GFR calc Af Amer >90  >90 (mL/min)    PROTIME-INR     Status: Normal   Collection Time   11/05/11  9:01 PM      Component Value Range Comment   Prothrombin Time 13.5  11.6 - 15.2 (seconds)    INR 1.01  0.00 - 1.49    APTT     Status: Normal   Collection Time   11/05/11  9:01 PM      Component Value Range Comment   aPTT 34  24 - 37 (seconds)   CK TOTAL AND CKMB     Status: Normal   Collection Time   11/05/11  9:02 PM      Component Value Range Comment   Total CK 63  7 - 177 (U/L)    CK, MB 1.7  0.3 - 4.0 (ng/mL)    Relative Index RELATIVE INDEX IS INVALID  0.0 - 2.5    TROPONIN I     Status: Normal   Collection Time   11/05/11  9:02 PM      Component Value Range Comment   Troponin I <0.30  <0.30 (ng/mL)   POCT I-STAT TROPONIN I     Status: Normal   Collection Time   11/05/11  9:17 PM      Component Value Range Comment   Troponin i, poc 0.01  0.00 - 0.08 (ng/mL)    Comment 3            POCT I-STAT, CHEM 8     Status: Abnormal   Collection Time  11/05/11 11:03 PM      Component Value Range Comment   Sodium 142  135 - 145 (mEq/L)    Potassium 3.8  3.5 - 5.1 (mEq/L)    Chloride 106  96 - 112 (mEq/L)    BUN 15  6 - 23 (mg/dL)    Creatinine, Ser 1.61  0.50 - 1.10 (mg/dL)    Glucose, Bld 096 (*) 70 - 99 (mg/dL)    Calcium, Ion 0.45  1.12 - 1.32 (mmol/L)    TCO2 20  0 - 100 (mmol/L)    Hemoglobin 15.3 (*) 12.0 - 15.0 (g/dL)    HCT 40.9  81.1 - 91.4 (%)   POCT I-STAT 3, BLOOD GAS (G3+)     Status: Abnormal   Collection Time   11/05/11 11:03 PM      Component Value Range Comment   pH, Arterial 6.882 (*) 7.350 - 7.400     pCO2 arterial 93.9 (*) 35.0 - 45.0 (mmHg)    pO2, Arterial 58.0 (*) 80.0 - 100.0 (mmHg)    Bicarbonate 17.8 (*) 20.0 - 24.0 (mEq/L)    TCO2 21  0 - 100 (mmol/L)    O2 Saturation 65.0      Acid-base deficit 18.0 (*) 0.0 - 2.0 (mmol/L)    Patient temperature 98.0 F      Sample type ARTERIAL      Comment NOTIFIED PHYSICIAN     POCT I-STAT, CHEM 8     Status: Abnormal   Collection Time   11/05/11 11:30 PM       Component Value Range Comment   Sodium 141  135 - 145 (mEq/L)    Potassium 3.4 (*) 3.5 - 5.1 (mEq/L)    Chloride 105  96 - 112 (mEq/L)    BUN 15  6 - 23 (mg/dL)    Creatinine, Ser 7.82  0.50 - 1.10 (mg/dL)    Glucose, Bld 956 (*) 70 - 99 (mg/dL)    Calcium, Ion 2.13  1.12 - 1.32 (mmol/L)    TCO2 22  0 - 100 (mmol/L)    Hemoglobin 15.6 (*) 12.0 - 15.0 (g/dL)    HCT 08.6  57.8 - 46.9 (%)    Ct Angio Chest W/cm &/or Wo Cm  11/05/2011  *RADIOLOGY REPORT*  Clinical Data: Chest pain, shortness of breath  CT ANGIOGRAPHY CHEST  Technique:  Multidetector CT imaging of the chest using the standard protocol during bolus administration of intravenous contrast. Multiplanar reconstructed images including MIPs were obtained and reviewed to evaluate the vascular anatomy.  Contrast: OMNIPAQUE IOHEXOL 300 MG/ML IV SOLN . After contrast administration the patient became   severely short of breath, dizzy, code called, CPR started, the patient transferred to the ED.  Comparison: 01/16/2005  Findings: There is fairly good contrast opacification of pulmonary artery branches.  Images are degraded by body habitus and patient breathing during the acquisition.  No convincing filling defects are identified to suggest acute PE.  There is minimal contrast opacification of the thoracic aorta.  There are small pleural effusions, right greater than left.  No pericardial effusion. There is reflux of contrast from the right atrium into the hepatic veins suggesting right heart failure.  No hilar or mediastinal adenopathy.  Calcified left thyroid lesion is noted.  There are extensive ground-glass and airspace opacities throughout both lungs in a predominately perihilar distribution.  Flowing osteophytes in the mid and lower thoracic spine.  IMPRESSION:  1.  Extensive bilateral airspace edema or infiltrates. 2.  Small bilateral pleural effusions. 3.  No definite pulmonary emboli, with the exam limitations as above.  I telephoned  the critical test results to Dr. Lynelle Doctor at the time of interpretation.  Original Report Authenticated By: Osa Craver, M.D.   Dg Chest Port 1 View  11/05/2011  *RADIOLOGY REPORT*  Clinical Data: CPR, intubated  PORTABLE CHEST - 1 VIEW  Comparison: Early exam the same day  Findings: An endotracheal tube has been placed 2 cm into the right mainstem bronchus.  There are extensive bilateral airspace opacities with air bronchograms seen centrally.  Cannot exclude pleural effusions.  Heart size difficult to assess due to adjacent opacities.  Regional bones unremarkable.  IMPRESSION:  1.  Low position of the endotracheal tube into the right mainstem bronchus, should be retracted. I telephoned the critical test results to Dr. Lynelle Doctor at the time of interpretation. 2.  Extensive bilateral airspace edema or infiltrates.  Original Report Authenticated By: Osa Craver, M.D.    ECG:  1. Sinus rhythm, LVH, nonspecific twave changes  2. Post arrest, ST elevation in I, AVL, diffuse ST depression 3. Follow up shows ST elevation resolved with diffuse ST depression   Impression: S/p cardiopulmonary arrest Acute Respiratory Failure Respiratory Acidosis Pulmonary edema Hypertension, uncontrolled Patient did have ecg changes post arrest, which resolved shortly thereafter.  Code Stemi was cancelled.  No immediate invasive evaluation planned at this time as not felt to be acute coronary syndrome.  Recommendations/Plan: --Agree with Hypothermia protocol --Daily ecg, continous telemetry --Serial biomarkers --ASA 325mg  --Agree with IV Lasix bolus and Nitro gtt for elevated blood pressure and pulmonary edema --Strict I/Os, daily weights --Consider TTE in am assess LV function, wall motion --Further recommendations to be made depending on clinical course, prognosis guarded at this time.

## 2011-11-05 NOTE — Progress Notes (Signed)
Brought family to the family room and was present with them when the doctor came in to tell them that pt was in critical condition.  Pt's cousin and her husband were present.  They called pt's mother who is in fragile health and have gone over to be with her in person.  They also called her sister who lives in [place] and who is also not in good health.  Family will come back to see her this evening.  Provided emotional support and prayer for family.  Thornell Sartorius Pager, [phone] 11:47 PM   11/05/11 2300  Clinical Encounter Type  Visited With Family  Visit Type Critical Care;Spiritual support  Spiritual Encounters  Spiritual Needs Prayer

## 2011-11-05 NOTE — Code Documentation (Signed)
X-ray at bedside

## 2011-11-05 NOTE — ED Notes (Signed)
The pt was sent here from [facility] walk-in to r/o a pe.  The pt is currently hyperventilating.  She has had sob since last pm  And she has sle chest pain and some lt leg pain.   She had a vein injection on the 24th of January to her lt leg

## 2011-11-05 NOTE — ED Notes (Signed)
The pt respirations slowed down almost immediately when the nasal cannula was placed on her nose

## 2011-11-06 ENCOUNTER — Inpatient Hospital Stay (HOSPITAL_COMMUNITY): Payer: Medicare Other

## 2011-11-06 ENCOUNTER — Other Ambulatory Visit: Payer: Self-pay

## 2011-11-06 DIAGNOSIS — Z8674 Personal history of sudden cardiac arrest: Secondary | ICD-10-CM | POA: Diagnosis not present

## 2011-11-06 DIAGNOSIS — I5021 Acute systolic (congestive) heart failure: Secondary | ICD-10-CM | POA: Diagnosis not present

## 2011-11-06 DIAGNOSIS — L8991 Pressure ulcer of unspecified site, stage 1: Secondary | ICD-10-CM | POA: Diagnosis present

## 2011-11-06 DIAGNOSIS — J811 Chronic pulmonary edema: Secondary | ICD-10-CM | POA: Diagnosis not present

## 2011-11-06 DIAGNOSIS — R109 Unspecified abdominal pain: Secondary | ICD-10-CM | POA: Diagnosis not present

## 2011-11-06 DIAGNOSIS — J810 Acute pulmonary edema: Secondary | ICD-10-CM

## 2011-11-06 DIAGNOSIS — E876 Hypokalemia: Secondary | ICD-10-CM | POA: Diagnosis not present

## 2011-11-06 DIAGNOSIS — H409 Unspecified glaucoma: Secondary | ICD-10-CM | POA: Diagnosis not present

## 2011-11-06 DIAGNOSIS — L89899 Pressure ulcer of other site, unspecified stage: Secondary | ICD-10-CM | POA: Diagnosis present

## 2011-11-06 DIAGNOSIS — Z09 Encounter for follow-up examination after completed treatment for conditions other than malignant neoplasm: Secondary | ICD-10-CM | POA: Diagnosis not present

## 2011-11-06 DIAGNOSIS — Z9119 Patient's noncompliance with other medical treatment and regimen: Secondary | ICD-10-CM | POA: Diagnosis not present

## 2011-11-06 DIAGNOSIS — L8992 Pressure ulcer of unspecified site, stage 2: Secondary | ICD-10-CM | POA: Diagnosis present

## 2011-11-06 DIAGNOSIS — E662 Morbid (severe) obesity with alveolar hypoventilation: Secondary | ICD-10-CM | POA: Diagnosis present

## 2011-11-06 DIAGNOSIS — Z6841 Body Mass Index (BMI) 40.0 and over, adult: Secondary | ICD-10-CM | POA: Diagnosis not present

## 2011-11-06 DIAGNOSIS — J9600 Acute respiratory failure, unspecified whether with hypoxia or hypercapnia: Secondary | ICD-10-CM

## 2011-11-06 DIAGNOSIS — D72829 Elevated white blood cell count, unspecified: Secondary | ICD-10-CM | POA: Diagnosis present

## 2011-11-06 DIAGNOSIS — L89009 Pressure ulcer of unspecified elbow, unspecified stage: Secondary | ICD-10-CM | POA: Diagnosis present

## 2011-11-06 DIAGNOSIS — I5023 Acute on chronic systolic (congestive) heart failure: Secondary | ICD-10-CM | POA: Diagnosis not present

## 2011-11-06 DIAGNOSIS — J9620 Acute and chronic respiratory failure, unspecified whether with hypoxia or hypercapnia: Secondary | ICD-10-CM | POA: Diagnosis present

## 2011-11-06 DIAGNOSIS — I2789 Other specified pulmonary heart diseases: Secondary | ICD-10-CM | POA: Diagnosis present

## 2011-11-06 DIAGNOSIS — R0902 Hypoxemia: Secondary | ICD-10-CM | POA: Diagnosis present

## 2011-11-06 DIAGNOSIS — I10 Essential (primary) hypertension: Secondary | ICD-10-CM | POA: Diagnosis not present

## 2011-11-06 DIAGNOSIS — IMO0001 Reserved for inherently not codable concepts without codable children: Secondary | ICD-10-CM | POA: Diagnosis not present

## 2011-11-06 DIAGNOSIS — I469 Cardiac arrest, cause unspecified: Secondary | ICD-10-CM | POA: Diagnosis not present

## 2011-11-06 DIAGNOSIS — I2510 Atherosclerotic heart disease of native coronary artery without angina pectoris: Secondary | ICD-10-CM | POA: Diagnosis not present

## 2011-11-06 DIAGNOSIS — K7200 Acute and subacute hepatic failure without coma: Secondary | ICD-10-CM | POA: Diagnosis present

## 2011-11-06 DIAGNOSIS — J9610 Chronic respiratory failure, unspecified whether with hypoxia or hypercapnia: Secondary | ICD-10-CM | POA: Diagnosis not present

## 2011-11-06 DIAGNOSIS — G9349 Other encephalopathy: Secondary | ICD-10-CM | POA: Diagnosis present

## 2011-11-06 DIAGNOSIS — I509 Heart failure, unspecified: Secondary | ICD-10-CM | POA: Diagnosis present

## 2011-11-06 DIAGNOSIS — M6281 Muscle weakness (generalized): Secondary | ICD-10-CM | POA: Diagnosis not present

## 2011-11-06 DIAGNOSIS — R5381 Other malaise: Secondary | ICD-10-CM | POA: Diagnosis not present

## 2011-11-06 DIAGNOSIS — I517 Cardiomegaly: Secondary | ICD-10-CM | POA: Diagnosis not present

## 2011-11-06 DIAGNOSIS — E872 Acidosis: Secondary | ICD-10-CM | POA: Diagnosis not present

## 2011-11-06 DIAGNOSIS — G894 Chronic pain syndrome: Secondary | ICD-10-CM | POA: Diagnosis not present

## 2011-11-06 DIAGNOSIS — G4733 Obstructive sleep apnea (adult) (pediatric): Secondary | ICD-10-CM | POA: Diagnosis not present

## 2011-11-06 DIAGNOSIS — R918 Other nonspecific abnormal finding of lung field: Secondary | ICD-10-CM | POA: Diagnosis not present

## 2011-11-06 DIAGNOSIS — Z79899 Other long term (current) drug therapy: Secondary | ICD-10-CM | POA: Diagnosis not present

## 2011-11-06 DIAGNOSIS — E785 Hyperlipidemia, unspecified: Secondary | ICD-10-CM | POA: Diagnosis not present

## 2011-11-06 DIAGNOSIS — I5022 Chronic systolic (congestive) heart failure: Secondary | ICD-10-CM | POA: Diagnosis not present

## 2011-11-06 DIAGNOSIS — R0602 Shortness of breath: Secondary | ICD-10-CM | POA: Diagnosis not present

## 2011-11-06 DIAGNOSIS — J90 Pleural effusion, not elsewhere classified: Secondary | ICD-10-CM | POA: Diagnosis not present

## 2011-11-06 DIAGNOSIS — Z7982 Long term (current) use of aspirin: Secondary | ICD-10-CM | POA: Diagnosis not present

## 2011-11-06 DIAGNOSIS — R079 Chest pain, unspecified: Secondary | ICD-10-CM | POA: Diagnosis not present

## 2011-11-06 LAB — GLUCOSE, CAPILLARY
Glucose-Capillary: 293 mg/dL — ABNORMAL HIGH (ref 70–99)
Glucose-Capillary: 176 mg/dL — ABNORMAL HIGH (ref 70–99)
Glucose-Capillary: 153 mg/dL — ABNORMAL HIGH (ref 70–99)
Glucose-Capillary: 158 mg/dL — ABNORMAL HIGH (ref 70–99)
Glucose-Capillary: 164 mg/dL — ABNORMAL HIGH (ref 70–99)
Glucose-Capillary: 177 mg/dL — ABNORMAL HIGH (ref 70–99)
Glucose-Capillary: 149 mg/dL — ABNORMAL HIGH (ref 70–99)

## 2011-11-06 LAB — BASIC METABOLIC PANEL
BUN: 17 mg/dL (ref 6–23)
BUN: 17 mg/dL (ref 6–23)
CO2: 23 mEq/L (ref 19–32)
CO2: 20 mEq/L (ref 19–32)
CO2: 20 mEq/L (ref 19–32)
CO2: 20 mEq/L (ref 19–32)
Calcium: 8.9 mg/dL (ref 8.4–10.5)
Chloride: 105 mEq/L (ref 96–112)
Chloride: 106 mEq/L (ref 96–112)
Chloride: 105 mEq/L (ref 96–112)
Creatinine, Ser: 0.48 mg/dL — ABNORMAL LOW (ref 0.50–1.10)
GFR calc Af Amer: >90 mL/min (ref >90)
GFR calc non Af Amer: >90 mL/min (ref >90)
GFR calc non Af Amer: >90 mL/min (ref >90)
Glucose, Bld: 224 mg/dL — ABNORMAL HIGH (ref 70–99)
Glucose, Bld: 213 mg/dL — ABNORMAL HIGH (ref 70–99)
Glucose, Bld: 234 mg/dL — ABNORMAL HIGH (ref 70–99)
Potassium: 3 mEq/L — ABNORMAL LOW (ref 3.5–5.1)
Potassium: 2.9 mEq/L — ABNORMAL LOW (ref 3.5–5.1)
Potassium: 3.8 mEq/L (ref 3.5–5.1)
Potassium: 4 mEq/L (ref 3.5–5.1)
Sodium: 140 mEq/L (ref 135–145)
Sodium: 140 mEq/L (ref 135–145)
Sodium: 141 mEq/L (ref 135–145)

## 2011-11-06 LAB — CARDIAC PANEL(CRET KIN+CKTOT+MB+TROPI)
Relative Index: INVALID (ref 0.0–2.5)
Relative Index: INVALID (ref 0.0–2.5)
Total CK: 89 U/L (ref 7–177)
Total CK: 87 U/L (ref 7–177)
Troponin I: 0.85 ng/mL (ref <0.30)
Troponin I: 0.46 ng/mL (ref <0.30)

## 2011-11-06 LAB — PROTIME-INR
INR: 1.17 (ref 0.00–1.49)
Prothrombin Time: 14.5 seconds (ref 11.6–15.2)
Prothrombin Time: 15.1 seconds (ref 11.6–15.2)

## 2011-11-06 LAB — POCT I-STAT 3, ART BLOOD GAS (G3+)
Acid-base deficit: 2 mmol/L (ref 0.0–2.0)
Bicarbonate: 19.8 mEq/L — ABNORMAL LOW (ref 20.0–24.0)
Bicarbonate: 21.5 mEq/L (ref 20.0–24.0)
Bicarbonate: 23.5 mEq/L (ref 20.0–24.0)
O2 Saturation: 89 %
TCO2: 23 mmol/L (ref 0–100)
pCO2 arterial: 26.5 mmHg — ABNORMAL LOW (ref 35.0–45.0)
pCO2 arterial: 32.7 mmHg — ABNORMAL LOW (ref 35.0–45.0)
pCO2 arterial: 42.1 mmHg (ref 35.0–45.0)
pH, Arterial: 7.113 — CL (ref 7.350–7.400)
pH, Arterial: 7.449 — ABNORMAL HIGH (ref 7.350–7.400)
pH, Arterial: 7.485 — ABNORMAL HIGH (ref 7.350–7.400)
pO2, Arterial: 33 mmHg — CL (ref 80.0–100.0)
pO2, Arterial: 426 mmHg — ABNORMAL HIGH (ref 80.0–100.0)
pO2, Arterial: 60 mmHg — ABNORMAL LOW (ref 80.0–100.0)
pO2, Arterial: 64 mmHg — ABNORMAL LOW (ref 80.0–100.0)

## 2011-11-06 LAB — POCT I-STAT, CHEM 8
Calcium, Ion: 1.08 mmol/L — ABNORMAL LOW (ref 1.12–1.32)
Chloride: 111 mEq/L (ref 96–112)
HCT: 41 % (ref 36.0–46.0)
Sodium: 143 mEq/L (ref 135–145)

## 2011-11-06 LAB — LEGIONELLA ANTIGEN, URINE

## 2011-11-06 LAB — PRO B NATRIURETIC PEPTIDE: Pro B Natriuretic peptide (BNP): 1797 pg/mL — ABNORMAL HIGH (ref 0–125)

## 2011-11-06 LAB — COMPREHENSIVE METABOLIC PANEL
ALT: 89 U/L — ABNORMAL HIGH (ref 0–35)
AST: 69 U/L — ABNORMAL HIGH (ref 0–37)
Albumin: 2.9 g/dL — ABNORMAL LOW (ref 3.5–5.2)
Alkaline Phosphatase: 156 U/L — ABNORMAL HIGH (ref 39–117)
CO2: 17 mEq/L — ABNORMAL LOW (ref 19–32)
Chloride: 105 mEq/L (ref 96–112)
GFR calc non Af Amer: >90 mL/min (ref >90)
Potassium: 3.8 mEq/L (ref 3.5–5.1)
Total Bilirubin: 1.2 mg/dL (ref 0.3–1.2)

## 2011-11-06 LAB — MRSA PCR SCREENING: MRSA by PCR: NEGATIVE

## 2011-11-06 LAB — APTT: aPTT: 29 seconds (ref 24–37)

## 2011-11-06 MED ORDER — ASPIRIN 300 MG RE SUPP
300.0000 mg | RECTAL | Status: AC
  Administered 2011-11-06: 300 mg via RECTAL
  Filled 2011-11-06: qty 1

## 2011-11-06 MED ORDER — LEVOFLOXACIN IN D5W 750 MG/150ML IV SOLN
750.0000 mg | INTRAVENOUS | Status: DC
  Administered 2011-11-06 – 2011-11-08 (×3): 750 mg via INTRAVENOUS
  Filled 2011-11-06 (×3): qty 150

## 2011-11-06 MED ORDER — DEXTROSE 5 % IV SOLN
0.5000 ug/min | INTRAVENOUS | Status: DC
  Administered 2011-11-06: 5 ug/min via INTRAVENOUS
  Filled 2011-11-06 (×4): qty 4

## 2011-11-06 MED ORDER — POTASSIUM CHLORIDE 10 MEQ/50ML IV SOLN
10.0000 meq | INTRAVENOUS | Status: AC
  Administered 2011-11-06 (×4): 10 meq via INTRAVENOUS

## 2011-11-06 MED ORDER — BUMETANIDE 0.25 MG/ML IJ SOLN
4.0000 mg | INTRAMUSCULAR | Status: AC
  Administered 2011-11-06: 4 mg via INTRAVENOUS
  Filled 2011-11-06: qty 16

## 2011-11-06 MED ORDER — SODIUM CHLORIDE 0.9 % IV SOLN
INTRAVENOUS | Status: DC
  Administered 2011-11-06: 20 mL/h via INTRAVENOUS
  Administered 2011-11-06 – 2011-11-07 (×2): via INTRAVENOUS

## 2011-11-06 MED ORDER — FENTANYL CITRATE 0.05 MG/ML IJ SOLN: 100.0000 ug | Freq: Once | INTRAMUSCULAR | Status: DC

## 2011-11-06 MED ORDER — ACETAMINOPHEN 160 MG/5ML PO SOLN
650.0000 mg | Freq: Four times a day (QID) | ORAL | Status: DC | PRN
  Administered 2011-11-06 – 2011-11-08 (×3): 650 mg
  Filled 2011-11-06 (×4): qty 20.3

## 2011-11-06 MED ORDER — SODIUM CHLORIDE 0.9 % IV SOLN: 2000.0000 mL | Freq: Once | INTRAVENOUS | Status: DC

## 2011-11-06 MED ORDER — INSULIN ASPART 100 UNIT/ML ~~LOC~~ SOLN
0.0000 [IU] | SUBCUTANEOUS | Status: DC
  Administered 2011-11-06: 4 [IU] via SUBCUTANEOUS
  Administered 2011-11-06: 3 [IU] via SUBCUTANEOUS
  Administered 2011-11-07 – 2011-11-08 (×8): 1 [IU] via SUBCUTANEOUS
  Administered 2011-11-08 – 2011-11-10 (×8): 3 [IU] via SUBCUTANEOUS
  Administered 2011-11-10: 4 [IU] via SUBCUTANEOUS
  Administered 2011-11-10 (×2): 1 [IU] via SUBCUTANEOUS
  Administered 2011-11-10 (×2): 3 [IU] via SUBCUTANEOUS
  Administered 2011-11-11 (×2): 1 [IU] via SUBCUTANEOUS
  Filled 2011-11-06: qty 3

## 2011-11-06 MED ORDER — SODIUM CHLORIDE 0.9 % IV SOLN
INTRAVENOUS | Status: DC
  Administered 2011-11-06: 20 mL/h via INTRAVENOUS
  Administered 2011-11-07 – 2011-11-11 (×3): via INTRAVENOUS

## 2011-11-06 MED ORDER — DEXTROSE 5 % IV SOLN
4.0000 mg | INTRAVENOUS | Status: DC
  Filled 2011-11-06: qty 16

## 2011-11-06 MED ORDER — MIDAZOLAM BOLUS VIA INFUSION
2.0000 mg | INTRAVENOUS | Status: DC | PRN
  Filled 2011-11-06 (×2): qty 2

## 2011-11-06 MED ORDER — PANTOPRAZOLE SODIUM 40 MG IV SOLR
40.0000 mg | INTRAVENOUS | Status: DC
  Administered 2011-11-06 – 2011-11-11 (×6): 40 mg via INTRAVENOUS
  Filled 2011-11-06 (×7): qty 40

## 2011-11-06 MED ORDER — SODIUM CHLORIDE 0.9 % IV SOLN
25.0000 ug/h | INTRAVENOUS | Status: DC
  Administered 2011-11-06: 200 ug/h via INTRAVENOUS
  Administered 2011-11-06: 350 ug/h via INTRAVENOUS
  Administered 2011-11-06: 400 ug/h via INTRAVENOUS
  Administered 2011-11-07: 350 ug/h via INTRAVENOUS
  Administered 2011-11-07: 300 ug/h via INTRAVENOUS
  Administered 2011-11-07: 350 ug/h via INTRAVENOUS
  Filled 2011-11-06 (×6): qty 50

## 2011-11-06 MED ORDER — SODIUM CHLORIDE 0.9 % IV SOLN
INTRAVENOUS | Status: DC
  Filled 2011-11-06: qty 1

## 2011-11-06 MED ORDER — SODIUM CHLORIDE 0.9 % IV SOLN
1.0000 mg/h | INTRAVENOUS | Status: DC
  Administered 2011-11-06: 6 mg/h via INTRAVENOUS
  Administered 2011-11-06: 4 mg/h via INTRAVENOUS
  Administered 2011-11-06: 6 mg/h via INTRAVENOUS
  Administered 2011-11-06: 2 mg/h via INTRAVENOUS
  Administered 2011-11-07 (×3): 6 mg/h via INTRAVENOUS
  Filled 2011-11-06 (×6): qty 10

## 2011-11-06 MED ORDER — FENTANYL BOLUS VIA INFUSION
50.0000 ug | INTRAVENOUS | Status: DC | PRN
  Filled 2011-11-06: qty 50

## 2011-11-06 MED ORDER — SODIUM CHLORIDE 0.9 % IV SOLN
1.0000 ug/kg/min | INTRAVENOUS | Status: DC
  Administered 2011-11-06: 1.5 ug/kg/min via INTRAVENOUS
  Filled 2011-11-06 (×4): qty 20

## 2011-11-06 MED ORDER — MIDAZOLAM HCL 2 MG/2ML IJ SOLN: 2.0000 mg | Freq: Once | INTRAMUSCULAR | Status: DC | PRN

## 2011-11-06 MED ORDER — SODIUM CHLORIDE 0.9 % IV SOLN
1.0000 g | Freq: Once | INTRAVENOUS | Status: AC
  Administered 2011-11-06: 1 g via INTRAVENOUS
  Filled 2011-11-06: qty 10

## 2011-11-06 MED ORDER — CISATRACURIUM BOLUS VIA INFUSION
0.0500 mg/kg | Freq: Once | INTRAVENOUS | Status: DC | PRN
  Filled 2011-11-06: qty 8

## 2011-11-06 MED ORDER — ARTIFICIAL TEARS OP OINT
1.0000 "application " | TOPICAL_OINTMENT | Freq: Three times a day (TID) | OPHTHALMIC | Status: DC
  Administered 2011-11-06 – 2011-11-07 (×5): 1 via OPHTHALMIC
  Filled 2011-11-06: qty 3.5

## 2011-11-06 MED ORDER — HEPARIN SODIUM (PORCINE) 5000 UNIT/ML IJ SOLN
5000.0000 [IU] | Freq: Three times a day (TID) | INTRAMUSCULAR | Status: DC
  Administered 2011-11-06 – 2011-11-15 (×29): 5000 [IU] via SUBCUTANEOUS
  Filled 2011-11-06 (×31): qty 1

## 2011-11-06 MED ORDER — POTASSIUM CHLORIDE 20 MEQ/15ML (10%) PO LIQD
ORAL | Status: AC
  Filled 2011-11-06: qty 30

## 2011-11-06 MED ORDER — DEXTROSE 10 % IV SOLN: INTRAVENOUS | Status: DC

## 2011-11-06 MED ORDER — FENTANYL CITRATE 0.05 MG/ML IJ SOLN: 100.0000 ug | Freq: Once | INTRAMUSCULAR | Status: DC | PRN

## 2011-11-06 MED ORDER — CHLORHEXIDINE GLUCONATE 0.12 % MT SOLN
15.0000 mL | Freq: Two times a day (BID) | OROMUCOSAL | Status: DC
  Administered 2011-11-06 – 2011-11-10 (×9): 15 mL via OROMUCOSAL
  Filled 2011-11-06 (×9): qty 15

## 2011-11-06 MED ORDER — BIOTENE DRY MOUTH MT LIQD
15.0000 mL | Freq: Four times a day (QID) | OROMUCOSAL | Status: DC
  Administered 2011-11-06 – 2011-11-12 (×23): 15 mL via OROMUCOSAL

## 2011-11-06 MED ORDER — CISATRACURIUM BESYLATE 2 MG/ML IV SOLN
0.1000 mg/kg | Freq: Once | INTRAVENOUS | Status: DC
  Filled 2011-11-06: qty 7.5

## 2011-11-06 MED ORDER — FUROSEMIDE 10 MG/ML IJ SOLN
40.0000 mg | Freq: Four times a day (QID) | INTRAMUSCULAR | Status: AC
  Administered 2011-11-06 (×2): 40 mg via INTRAVENOUS
  Filled 2011-11-06 (×2): qty 4

## 2011-11-06 MED ORDER — POTASSIUM CHLORIDE 10 MEQ/50ML IV SOLN
INTRAVENOUS | Status: AC
  Filled 2011-11-06: qty 200

## 2011-11-06 MED ORDER — POTASSIUM CHLORIDE 10 MEQ/50ML IV SOLN
10.0000 meq | INTRAVENOUS | Status: AC
  Administered 2011-11-06 (×4): 10 meq via INTRAVENOUS
  Filled 2011-11-06: qty 200

## 2011-11-06 MED ORDER — CISATRACURIUM BESYLATE 2 MG/ML IV SOLN
0.1000 mg/kg | Freq: Once | INTRAVENOUS | Status: DC | PRN
  Filled 2011-11-06: qty 7.5

## 2011-11-06 MED ORDER — DEXTROSE 5 % IV SOLN
2.0000 g | INTRAVENOUS | Status: DC
  Administered 2011-11-06 – 2011-11-08 (×3): 2 g via INTRAVENOUS
  Filled 2011-11-06 (×3): qty 2

## 2011-11-06 MED ORDER — POTASSIUM CHLORIDE 20 MEQ/15ML (10%) PO LIQD
40.0000 meq | Freq: Once | ORAL | Status: AC
  Administered 2011-11-06: 40 meq
  Filled 2011-11-06: qty 30

## 2011-11-06 MED ORDER — MIDAZOLAM HCL 2 MG/2ML IJ SOLN: 2.0000 mg | Freq: Once | INTRAMUSCULAR | Status: DC

## 2011-11-06 MED FILL — Medication: Qty: 1 | Status: AC

## 2011-11-06 NOTE — Progress Notes (Signed)
Pt received from ED sedated and paralyzed. Dr Herma Carson @ bedside to place central line. Arctic pads not sticking well to pt's body surface and open body surface areas noted and pt also not cooling , pads rearranged for total of 6pads (x multiple attempts). Ice added to under arms. Elink MD notified, new order obtained. [employer] Rep Misty Stanley) notified, gave some suggestions and elink MD re notified, new orders received. Will monitor.  Pt's sister and Brother in law educated on [place] therapy and pertinent information. They vu, saw [pt and went home.  Misty Stanley [facility] Rep) called to recheck 1hr later and update given re cooling machine. Cooling blanket applied and pt temp trending towards cooling with temp < 36.5.  Unable to complete Pt's history @ this time as family has gone home to get some rest. Will endorse to am RN for completion.

## 2011-11-06 NOTE — Progress Notes (Signed)
Subjective:  56 year old with witnessed PEA arrest in ED with acute respiratory failure, HTN, DM on hypothermia protocol.  Sedate on vent. ECHO is being done. EF appears severely decreased at ~30%. CXR and CT shows diffuse bilateral patchy airspace disease (possible edema).  ECG's on EPIC were reviewed and latest at 8:15 show no ST changes or Q waves.  Objective:  Vital Signs in the last 24 hours: Temp:  [96.3 F (35.7 C)-99.9 F (37.7 C)] 96.3 F (35.7 C) (02/05 0800) Pulse Rate:  [70-135] 73  (02/05 0800) Resp:  [0-70] 31  (02/05 0800) BP: (60-197)/(42-112) 128/98 mmHg (02/05 0800) SpO2:  [27 %-100 %] 97 % (02/05 0800) Arterial Line BP: (105-162)/(61-128) 136/128 mmHg (02/05 0800) FiO2 (%):  [100 %] 100 % (02/05 0800) Weight:  [100 kg (220 lb 7.4 oz)-170 kg (374 lb 12.5 oz)] 150.11 kg (330 lb 14.9 oz) (02/05 0123)  Intake/Output from previous day: 02/04 0701 - 02/05 0700 In: 469.8 [I.V.:269.8; IV Piggyback:200] Out: 2250 [Urine:2250]   Physical Exam: General: Well developed, well nourished, on vent. Head:  Normocephalic and atraumatic. Lungs: Diffuse rhonci. Heart: Normal S1 and S2.  No murmur, rubs or gallops.  Pulses: Pulses normal in all 4 extremities. Abdomen: obese, soft, non-tender, positive bowel sounds. Extremities: Chronic edema. Neurologic: Sedate    Lab Results:  Basename 11/05/11 2330 11/05/11 2303 11/05/11 2101  WBC -- -- 14.9*  HGB 15.6* 15.3* --  PLT -- -- 299    Basename 11/06/11 0751 11/06/11 0505  NA 141 140  K 3.8 2.9*  CL 106 105  CO2 23 20  GLUCOSE 213* 224*  BUN 17 17  CREATININE 0.51 0.54    Basename 11/05/11 2102  TROPONINI <0.30   Hepatic Function Panel  Basename 11/05/11 2101  PROT 7.3  ALBUMIN 3.5  AST 45*  ALT 59*  ALKPHOS 150*  BILITOT 0.8  BILIDIR --  IBILI --   Imaging: Ct Angio Chest W/cm &/or Wo Cm  11/05/2011  *RADIOLOGY REPORT*  Clinical Data: Chest pain, shortness of breath  CT ANGIOGRAPHY CHEST  Technique:   Multidetector CT imaging of the chest using the standard protocol during bolus administration of intravenous contrast. Multiplanar reconstructed images including MIPs were obtained and reviewed to evaluate the vascular anatomy.  Contrast: OMNIPAQUE IOHEXOL 300 MG/ML IV SOLN . After contrast administration the patient became   severely short of breath, dizzy, code called, CPR started, the patient transferred to the ED.  Comparison: 01/16/2005  Findings: There is fairly good contrast opacification of pulmonary artery branches.  Images are degraded by body habitus and patient breathing during the acquisition.  No convincing filling defects are identified to suggest acute PE.  There is minimal contrast opacification of the thoracic aorta.  There are small pleural effusions, right greater than left.  No pericardial effusion. There is reflux of contrast from the right atrium into the hepatic veins suggesting right heart failure.  No hilar or mediastinal adenopathy.  Calcified left thyroid lesion is noted.  There are extensive ground-glass and airspace opacities throughout both lungs in a predominately perihilar distribution.  Flowing osteophytes in the mid and lower thoracic spine.  IMPRESSION:  1.  Extensive bilateral airspace edema or infiltrates. 2.  Small bilateral pleural effusions. 3.  No definite pulmonary emboli, with the exam limitations as above.  I telephoned the critical test results to Dr. Lynelle Doctor at the time of interpretation.  Original Report Authenticated By: Osa Craver, M.D.   Dg Chest  Port 1 View  11/06/2011  *RADIOLOGY REPORT*  Clinical Data: Central line placement  PORTABLE CHEST - 1 VIEW  Comparison: 11/06/2011  Findings: Interval right IJ catheter placement with tip projecting over the mid SVC.  No pneumothorax.  Otherwise no interval change.  IMPRESSION: Interval right IJ catheter placement, tip projecting over the mid SVC.  No pneumothorax.  Original Report Authenticated By: Waneta Martins, M.D.   Dg Chest Port 1 View  11/06/2011  *RADIOLOGY REPORT*  Clinical Data: Central line placement  PORTABLE CHEST - 1 VIEW  Comparison: 11/06/2011  Findings: Interval retraction of the left IJ catheter, with tip now projecting over the left brachiocephalic vein.  Otherwise, no significant interval change.  Endotracheal tube tip is positioned 1.5 cm proximal the carina.  NG tube descends into the abdomen, tip not visualized.  Cardiomegaly.  Central vascular congestion. Bilateral airspace opacities and likely layering pleural effusions. No pneumothorax.  No acute osseous abnormality.  IMPRESSION: Interval left IJ catheter retraction with tip now projecting over the left brachiocephalic vein.  Endotracheal tube tip positioned 1.5 cm proximal to the carina.  Cardiomegaly.  Bilateral airspace opacities/advanced edema pattern and probable effusions, similar to prior.  Original Report Authenticated By: Waneta Martins, M.D.   Dg Chest Port 1 View  11/06/2011  *RADIOLOGY REPORT*  Clinical Data: Central line placement  PORTABLE CHEST - 1 VIEW  Comparison: 11/05/2011  Findings: Interval left IJ catheter placement, with catheter crosses midline and tip projecting over the right brachiocephalic vein.  Endotracheal tube tip is positioned 1 cm proximal to the carina.  Cardiomegaly.  Bilateral opacities and effusions.  No pneumothorax. NG tube descends into the abdomen, tip not visualized.  IMPRESSION: Left IJ catheter crosses midline, with tip positioned over the right brachiocephalic vein.  Endotracheal tube tip positioned 1 cm proximal to the carina.  Bilateral airspace opacities and effusions.  Original Report Authenticated By: Waneta Martins, M.D.   Dg Chest Port 1 View  11/05/2011  *RADIOLOGY REPORT*  Clinical Data: CPR, intubated  PORTABLE CHEST - 1 VIEW  Comparison: Early exam the same day  Findings: An endotracheal tube has been placed 2 cm into the right mainstem bronchus.  There are extensive  bilateral airspace opacities with air bronchograms seen centrally.  Cannot exclude pleural effusions.  Heart size difficult to assess due to adjacent opacities.  Regional bones unremarkable.  IMPRESSION:  1.  Low position of the endotracheal tube into the right mainstem bronchus, should be retracted. I telephoned the critical test results to Dr. Lynelle Doctor at the time of interpretation. 2.  Extensive bilateral airspace edema or infiltrates.  Original Report Authenticated By: Osa Craver, M.D.   Personally viewed.   Telemetry: No VT Personally viewed.   EKG:  This am, no ST changes. Last evening, NSSTW changes.   Cardiac Studies:  ECHO prelim - EF 25-30%  Assessment/Plan:   56 year old with witnessed PEA arrest in ED with acute respiratory failure, HTN, DM, morbid obesity on hypothermia protocol with cardiomyopathy EF 25-30% with LVH likely hypertensive cardiomyopathy.   -Continue aggressive IV lasix (so far -1.7liters) -Control BP, when able ACE-I, Bb. Bidil may be helpful in future. NTG gtt ok if needed.  -Keep K >4, Mag > 2 -Cardiac biomarkers are reasonable but are expected to be elevated in this setting.  -Further cardiac testing based on neurologic status in future.  -On broad spectrum abx per CCM.  -Hypothermia protocol.   Critical care time -  discussed case with nursing, critical care team (who spoke to brother while I was present) review of records, labs.   Nickolis Diel 11/06/2011, 9:05 AM

## 2011-11-06 NOTE — Procedures (Signed)
Name:  KESHONNA VALVO MRN:  [id number] DOB:  03-19-1956  PROCEDURE NOTE  Procedure:  Ultrasound-guided central venous catheter placement.  Indications:  Need for intravenous access and hemodynamic monitoring.  Consent:  Consent was implied due to the emergency nature of the procedure.  Anesthesia:  A total of 10 mL of 1% Lidocaine was used for local infiltration anesthesia.  Procedure summary:  Appropriate equipment was assembled.  The patient was identified as Virgel Manifold and safety timeout was performed. The patient was placed in Trendelenburg position.  Sterile technique was used. The patient's left neck region was prepped using chlorhexidine / alcohol scrub and the field was draped in usual sterile fashion with full body drape. The left internal jugular vein and the left carotid artery were identified by ultrasound, the patency was evaluated and images were documented. After the adequate anesthesia was achieved, the vein was cannulated with the introducer needle under sonographic guidance with minor difficulty. A guide wire was advanced through the introducer needle, which was then withdrawn. A small skin incision was made at the point of wire entry, the dilator was inserted over the guide wire and appropriate dilation was obtained. The dilator was removed and a triple-lumen catheter was advanced over the guide wire, which was then removed.  All ports were aspirated and flushed with normal saline without difficulty. The catheter was secured into place with sutures with sutures.  Antibiotic patch was placed and sterile dressing was applied. Post-procedure chest x-ray was ordered.  Complications:  No immediate complications were noted.  Hemodynamic parameters and oxygenation remained stable throughout the procedure.  Estimated blood loss:  Less then 5 mL.  Orlean Bradford, M.D. Pulmonary and Critical Care Medicine [facility] Cell: [phone] Pager: [phone]  11/06/2011,  3:24 AM

## 2011-11-06 NOTE — Progress Notes (Signed)
eLink Physician-Brief Progress Note Patient Name: Whitney [name] DOB: 06-03-1956 MRN: [id number]  Date of Service  11/06/2011   HPI/Events of Note  Currently receiving potassium for K of 3.0 now with K of 2.9 in the setting of K runs   eICU Interventions  Plan: Additional potassium ordered   Intervention Category Intermediate Interventions: Electrolyte abnormality - evaluation and management  Calliope Delangel 11/06/2011, 6:33 AM

## 2011-11-06 NOTE — Procedures (Signed)
Arterial Catheter Insertion Procedure Note Whitney [name] [id number] 01-Nov-1955  Procedure: Insertion of Arterial Catheter  Indications: Blood pressure monitoring and Frequent blood sampling  Procedure Details Consent: Unable to obtain consent because of emergent medical necessity. Time Out: Verified patient identification, verified procedure, site/side was marked, verified correct patient position, special equipment/implants available, medications/allergies/relevent history reviewed, required imaging and test results available.  Performed  Maximum sterile technique was used including antiseptics, cap, gloves, gown, hand hygiene and mask. Skin prep: Chlorhexidine; local anesthetic administered 22 gauge catheter was inserted into left radial artery using the Seldinger technique.  Evaluation Blood flow good; BP tracing good. Complications: No apparent complications.   Fredrich Birks 11/06/2011

## 2011-11-06 NOTE — H&P (Signed)
Name: Whitney [name] MRN: [id number] DOB: [birth year]    LOS: 1  PCCM ADMISSION NOTE  History of Present Illness: 56 yo AAF with recent history of dyspnea, chest pain and left leg pain sent to [facility] ED to r/o PE.  Suffered PEA cardiac arrest in CT scanner, was intubated and successfully resuscitated.  Subsequently developed hypertensive emergency associated with pulmonary edema and severe hypoxia.  Improved with Lasix / Nitroglycerine.  Brought to 2900 for hypothermia protocol.  Lines / Drains: 2/4  OETT 2/4  OGT 2/4  Foley 2/5  R IJ TLC  Cultures: 2/5  Blood 2/5  Sputum  Antibiotics: 2/5  Ceftriaxone 2/5  Levofloxacin  Tests / Events: 2/4  Chest CTA>>>Bilateral airspace disease, bilateral effusions, no PE  The patient is sedated, intubated and unable to provide history, which was obtained for available medical records.    Past Medical History  Diagnosis Date  . Diabetes mellitus   . Hypertension    History reviewed. No pertinent past surgical history. Prior to Admission medications   Medication Sig Start Date End Date Taking? Authorizing Provider  acetaminophen (TYLENOL) 500 MG tablet Take 500 mg by mouth every 6 (six) hours as needed. For pain   Yes Historical Provider, MD  aspirin 325 MG EC tablet Take 325 mg by mouth every 6 (six) hours as needed. For pain   Yes Historical Provider, MD  lisinopril (PRINIVIL,ZESTRIL) 20 MG tablet Take 20 mg by mouth daily.   Yes Historical Provider, MD  omeprazole (PRILOSEC) 40 MG capsule Take 40 mg by mouth 2 (two) times daily.   Yes Historical Provider, MD  oxybutynin (DITROPAN-XL) 5 MG 24 hr tablet Take 5 mg by mouth daily.   Yes Historical Provider, MD  simvastatin (ZOCOR) 10 MG tablet Take 10 mg by mouth at bedtime.   Yes Historical Provider, MD  traMADol (ULTRAM) 50 MG tablet Take 50 mg by mouth every 6 (six) hours as needed. For pain   Yes Historical Provider, MD   Allergies No Known Allergies  Family History History reviewed. No  pertinent family history.  Social History  reports that she has never smoked. She does not have any smokeless tobacco history on file. She reports that she does not drink alcohol. Her drug history not on file.  Review Of Systems  Patient unable to provide  Vital Signs: Temp:  [98 F (36.7 C)-99.9 F (37.7 C)] 99.9 F (37.7 C) (02/05 0244) Pulse Rate:  [79-135] 84  (02/05 0244) Resp:  [15-70] 35  (02/05 0244) BP: (60-197)/(42-112) 83/67 mmHg (02/05 0216) SpO2:  [27 %-99 %] 91 % (02/05 0244) FiO2 (%):  [100 %] 100 % (02/05 0244) Weight:  [100 kg (220 lb 7.4 oz)-170 kg (374 lb 12.5 oz)] 150.11 kg (330 lb 14.9 oz) (02/05 0123)    Physical Examination: General:  Intubated, mechanically ventilated, morbidly obese, no distress Neuro:  Sedated, synchronous, nonfocal HEENT:  PERRL, pink conjunctivae, moist membranes Neck:  Cannot assess JVD Cardiovascular:  RRR, no M/R/G Lungs:  Bilateral very diminished air entry, no W/R/R, pink frothy secretions via ETT  Abdomen:  Obese, bowel sounds present Musculoskeletal:  Chronic stasis  / edema  Skin:  No rash  Ventilator settings: Vent Mode:  [-] PRVC FiO2 (%):  [100 %] 100 % Set Rate:  [35 bmp] 35 bmp Vt Set:  [460 mL] 460 mL PEEP:  [16 cmH20-20 cmH20] 20 cmH20 Plateau Pressure:  [39 cmH20] 39 cmH20  Labs and Imaging:  Reviewed.  Please refer  to the Assessment and Plan section for relevant results.  ASSESSMENT AND PLAN  NEUROLOGIC A:  Encephalopathy secondary to sedation.  Possible anoxia.   P: -->  Reassess neurological status while off hypothermia protocol -->  Versed / Fentanyl / Nimbex  PULMONARY  Lab 11/06/11 11/05/11 2303  PHART 7.113* 6.882*  PCO2ART 62.0* 93.9*  PO2ART 33.0* 58.0*  HCO3 19.8* 17.8*  O2SAT 43.0 65.0   A:  Hypercarbic hypoxemic acute respiratory failure, secondary to acute pulmonary edema.  Suspect baseline OSA / OHS. P: -->  Full mechanical support -->  Repeat ABG  CARDIOVASCULAR  Lab 11/05/11  2102  TROPONINI <0.30  LATICACIDVEN --   A:  Status post PEA arrest, etiology is not clear, possibly severe hypoxia.  Hypertensive emergency with flash pulmonary edema.  Responded to Nitroglycerine / Lasix / Bumex.  Now hypotensive. P: -->  Hold preadmission PO medications and diuretics due to hypotension -->  Levophed per hypothermia protocol -->  TTE  RENAL  Lab 11/05/11 2330 11/05/11 2303 11/05/11 2101  NA 141 142 141  K 3.4* 3.8 --  CL 105 106 105  CO2 -- -- 29  GLUCOSE 369* 388* 201*  BUN 15 15 14   CREATININE 0.60 0.60 0.52  CALCIUM -- -- 9.5  MG -- -- --  PHOS -- -- --   A:  Normal renal function.  Hypokalemia. P: -->  Replace -->  Repeat BMP  GASTROINTESTINAL  Lab 11/05/11 2101  AST 45*  ALT 59*  ALKPHOS 150*  BILITOT 0.8  PROT 7.3  ALBUMIN 3.5   A:  Mildly elevated liver enzymes, possible shocked liver. P: -->  Recheck LFTs  HEMATOLOGIC  Lab 11/05/11 2330 11/05/11 2303 11/05/11 2101  HGB 15.6* 15.3* 11.2*  HCT 46.0 45.0 34.7*  PLT -- -- 299  INR -- -- 1.01  APTT -- -- 34   A:  No active issues P: -->  Monitor  INFECTIOUS  Lab 11/05/11 2101  WBC 14.9*  PROCALCITON --   A:  Mildly elevated WBC.  No clear source of infection.  Pneumonia is a possibility though cardiogenic edema is more likely. P: -->  Ceftriaxone / Levofloxacin -->  Sputum / blood cultures -->  Urine Strep / Legionella, PCT -->  Repeat CBC  ENDOCRINE No results found for this basename: GLUCAP:5 in the last 168 hours A:  Diabetes / Hyperglycemia P: -->  Insulin gtt  BEST PRACTICE / DISPOSITION -->  ICU status under PCCM -->  Full code -->  NPO -->  Heparin [place] for DVT Px -->  Protonix IV for GI Px -->  Ventilator bundle -->  Family updated  The patient is critically ill with multiple organ systems failure and requires high complexity decision making for assessment and support, frequent evaluation and titration of therapies, application of advanced monitoring  technologies and extensive interpretation of multiple databases. Critical Care Time devoted to patient care services described in this note is 60 minutes.  Orlean Bradford, M.D. Pulmonary and Critical Care Medicine [facility] Cell: [phone] Pager: [phone]  11/06/2011, 2:56 AM

## 2011-11-06 NOTE — Progress Notes (Signed)
  Echocardiogram 2D Echocardiogram has been performed.  Jorje Guild [facility] 11/06/2011, 9:51 AM

## 2011-11-06 NOTE — Progress Notes (Signed)
Name: Whitney [name] MRN: [id number] DOB: September 25, 1956    LOS: 1  PCCM ADMISSION NOTE  History of Present Illness: 56 yo AAF with recent history of dyspnea, chest pain and left leg pain sent to [facility] ED to r/o PE.  Suffered PEA cardiac arrest in CT scanner, was intubated and successfully resuscitated.  Subsequently developed hypertensive emergency associated with pulmonary edema and severe hypoxia.  Improved with Lasix / Nitroglycerine.  Brought to 2900 for hypothermia protocol.  Lines / Drains: 2/4  OETT 2/4  OGT 2/4  Foley 2/5  R IJ TLC  Cultures: 2/5  Blood 2/5  Sputum  Antibiotics: 2/5  Ceftriaxone 2/5  Levofloxacin  Tests / Events: 2/4  Chest CTA>>>Bilateral airspace disease, bilateral effusions, no PE  OVERNIGHT: Difficult to cool, multiple ice packs and cooling blanket applied  Vital Signs: Temp:  [96.3 F (35.7 C)-99.9 F (37.7 C)] 96.3 F (35.7 C) (02/05 0800) Pulse Rate:  [70-135] 73  (02/05 0800) Resp:  [0-70] 31  (02/05 0800) BP: (60-197)/(42-112) 128/98 mmHg (02/05 0800) SpO2:  [27 %-100 %] 97 % (02/05 0800) Arterial Line BP: (105-162)/(61-128) 136/128 mmHg (02/05 0800) FiO2 (%):  [100 %] 100 % (02/05 0800) Weight:  [100 kg (220 lb 7.4 oz)-170 kg (374 lb 12.5 oz)] 150.11 kg (330 lb 14.9 oz) (02/05 0123) I/O last 3 completed shifts: In: 469.8 [I.V.:269.8; IV Piggyback:200] Out: 2250 [Urine:2250]  Physical Examination: General:  Intubated, mechanically ventilated, morbidly obese, no distress Neuro:  Sedated, synchronous, nonfocal HEENT:  PERRL, pink conjunctivae, moist membranes Neck:  Cannot assess JVD Cardiovascular:  RRR, no M/R/G Lungs:  Bilateral very diminished air entry, no W/R/R, pink frothy secretions via ETT  Abdomen:  Obese, bowel sounds present Musculoskeletal:  Chronic stasis  / edema  Skin:  No rash  Ventilator settings: Vent Mode:  [-] PRVC FiO2 (%):  [100 %] 100 % Set Rate:  [35 bmp] 35 bmp Vt Set:  [460 mL] 460 mL PEEP:  [16 cmH20-20  cmH20] 20 cmH20 Plateau Pressure:  [39 cmH20-42 cmH20] 42 cmH20  Labs and Imaging:    CBC    Component Value Date/Time   WBC 14.9* 11/05/2011 2101   RBC 4.24 11/05/2011 2101   HGB 15.6* 11/05/2011 2330   HCT 46.0 11/05/2011 2330   PLT 299 11/05/2011 2101   MCV 81.8 11/05/2011 2101   MCH 26.4 11/05/2011 2101   MCHC 32.3 11/05/2011 2101   RDW 14.6 11/05/2011 2101   LYMPHSABS 2.2 11/05/2011 2101   MONOABS 0.8 11/05/2011 2101   EOSABS 0.0 11/05/2011 2101   BASOSABS 0.0 11/05/2011 2101    BMET    Component Value Date/Time   NA 141 11/06/2011 0751   K 3.8 11/06/2011 0751   CL 106 11/06/2011 0751   CO2 23 11/06/2011 0751   GLUCOSE 213* 11/06/2011 0751   BUN 17 11/06/2011 0751   CREATININE 0.51 11/06/2011 0751   CALCIUM 8.7 11/06/2011 0751   GFRNONAA >90 11/06/2011 0751   GFRAA >90 11/06/2011 0751      ASSESSMENT AND PLAN  56 y/o female s/p respiratory arrest after coming to the [facility] ED for evaluation of dyspnea, chest pain and leg pain.  Had PEA arrest requiring CPR for 10 minutes, hypertensive afterwards.    NEUROLOGIC A:  Encephalopathy secondary to sedation.  Possible anoxia.   P: -->  Reassess neurological status while off hypothermia protocol -->  Versed / Fentanyl / Nimbex  PULMONARY  Lab 11/06/11 0347 11/06/11 11/05/11 2303  PHART 7.320* 7.113*  6.882*  PCO2ART 42.1 62.0* 93.9*  PO2ART 64.0* 33.0* 58.0*  HCO3 21.5 19.8* 17.8*  O2SAT 89.0 43.0 65.0   A:  Hypercarbic hypoxemic acute respiratory failure, most likely due to acute pulmonary edema.  Suspect baseline OSA / OHS. P: -->  Full mechanical support -->  Repeat ABG -->  ARDS protocol ordered this morning  CARDIOVASCULAR  Lab 11/05/11 2102  TROPONINI <0.30  LATICACIDVEN --   A:  Status post PEA arrest, etiology is not clear, possibly severe hypoxia.  Hypertensive emergency with flash pulmonary edema.  Responded to Nitroglycerine / Lasix / Bumex.  Briefly hypotensive overnight. UOP excellent.  P: -->  Hold preadmission PO medications and  diuretics due to hypotension -->  Monitor BP , goal SBP <150 -->  TTE --> appreciate cardiology consult --> cycle enzymes --> lasix x2 today now that normotensive  RENAL  Lab 11/06/11 0505 11/06/11 0244 11/05/11 2330 11/05/11 2303 11/05/11 2101  NA 140 140 141 142 141  K 2.9* 3.0* -- -- --  CL 105 105 105 106 105  CO2 20 23 -- -- 29  GLUCOSE 224* 231* 369* 388* 201*  BUN 17 18 15 15 14   CREATININE 0.54 0.62 0.60 0.60 0.52  CALCIUM 8.9 8.6 -- -- 9.5  MG -- -- -- -- --  PHOS -- -- -- -- --   A:  Normal renal function.  Hypokalemia. P: -->  Replace K -->  Repeat BMP at 1400 today  GASTROINTESTINAL  Lab 11/05/11 2101  AST 45*  ALT 59*  ALKPHOS 150*  BILITOT 0.8  PROT 7.3  ALBUMIN 3.5   A:  Mildly elevated liver enzymes, possible shocked liver. P: -->  Recheck LFTs today with 1400 BMET  HEMATOLOGIC  Lab 11/06/11 0244 11/05/11 2330 11/05/11 2303 11/05/11 2101  HGB -- 15.6* 15.3* 11.2*  HCT -- 46.0 45.0 34.7*  PLT -- -- -- 299  INR 1.11 -- -- 1.01  APTT 29 -- -- 34   A:  No active issues P: -->  Monitor cbc  INFECTIOUS  Lab 11/06/11 0329 11/05/11 2101  WBC -- 14.9*  PROCALCITON 3.76 --   A:  Mildly elevated WBC.  No clear source of infection.  Pneumonia is a possibility though cardiogenic edema is more likely. P: -->  Ceftriaxone / Levofloxacin until cultures back, likely d/c on 2/6 -->  Sputum / blood cultures -->  Urine Strep / Legionella, PCT -->  Repeat CBC  ENDOCRINE  Lab 11/06/11 0718 11/06/11 0617 11/06/11 0510 11/06/11 0335 11/06/11 0308  GLUCAP 192* 176* 181* 190* 261*   A:  Diabetes / Hyperglycemia P: -->  Insulin gtt  BEST PRACTICE / DISPOSITION -->  ICU status under PCCM -->  Full code -->  NPO -->  Heparin [place] for DVT Px -->  Protonix IV for GI Px -->  Ventilator bundle -->  Family Lucius Conn, brother) notified by phone by me this morning.  The patient is critically ill with multiple organ systems failure and requires high  complexity decision making for assessment and support, frequent evaluation and titration of therapies, application of advanced monitoring technologies and extensive interpretation of multiple databases. Critical Care Time devoted to patient care services described in this note is 45 minutes.  Max Fickle Pulmonary and Critical Care Medicine [facility] Pager: [phone]  11/06/2011, 8:19 AM

## 2011-11-06 NOTE — Procedures (Signed)
Arterial Catheter Insertion Procedure Note Whitney [name] [id number] 1956/01/08  Procedure: Insertion of Arterial Catheter  Indications: Blood pressure monitoring and Frequent blood sampling  Procedure Details Consent: Unable to obtain consent because of emergent medical necessity. Time Out: Verified patient identification, verified procedure, site/side was marked, verified correct patient position, special equipment/implants available, medications/allergies/relevent history reviewed, required imaging and test results available.  Performed  Maximum sterile technique was used including antiseptics, cap, gloves, gown, hand hygiene, mask and sheet. Skin prep: Chlorhexidine; local anesthetic administered 22 gauge catheter was inserted into right radial artery using the Seldinger technique.  Evaluation Blood flow good; BP tracing good. Complications: No apparent complications .   Shelia Media [facility] 11/06/2011

## 2011-11-06 NOTE — ED Provider Notes (Signed)
I was present the entire time with the resident for direct supervision of the intubation procedure.  Celene Kras, MD 11/06/11 1044

## 2011-11-06 NOTE — Progress Notes (Signed)
eLink Physician-Brief Progress Note Patient Name: Whitney [name] DOB: 11/01/1955 MRN: [id number]  Date of Service  11/06/2011   HPI/Events of Note  Hypokalemia   eICU Interventions  Potassium replaced   Intervention Category Intermediate Interventions: Electrolyte abnormality - evaluation and management  DETERDING,ELIZABETH 11/06/2011, 4:26 AM

## 2011-11-06 NOTE — ED Notes (Signed)
Report called pt. To be transferred via stretcher with vent. RT, cardiac monitor and RN,

## 2011-11-06 NOTE — ED Notes (Signed)
ArcticSun started at this time

## 2011-11-06 NOTE — Plan of Care (Signed)
Problem: Phase I Progression Outcomes Goal: Cool target temp reached 2 hrs from start Outcome: Not Met (add Reason) Poss. Arctic pads not sticking well to body, ? fever

## 2011-11-06 NOTE — ED Provider Notes (Signed)
History     CSN: [id number]  Arrival date & time 11/05/11  2035   First MD Initiated Contact with Patient 11/05/11 2056      Chief Complaint  Patient presents with  . r/o pe     (Consider location/radiation/quality/duration/timing/severity/associated sxs/prior treatment) HPI  Past Medical History  Diagnosis Date  . Diabetes mellitus   . Hypertension     History reviewed. No pertinent past surgical history.  History reviewed. No pertinent family history.  History  Substance Use Topics  . Smoking status: Never Smoker   . Smokeless tobacco: Not on file  . Alcohol Use: No    OB History    Grav Para Term Preterm Abortions TAB SAB Ect Mult Living                  Review of Systems  Allergies  Review of patient's allergies indicates no known allergies.  Home Medications   Current Outpatient Rx  Name Route Sig Dispense Refill  . ACETAMINOPHEN 500 MG PO TABS Oral Take 500 mg by mouth every 6 (six) hours as needed. For pain    . ASPIRIN 325 MG PO TBEC Oral Take 325 mg by mouth every 6 (six) hours as needed. For pain    . LISINOPRIL 20 MG PO TABS Oral Take 20 mg by mouth daily.    Marland Kitchen OMEPRAZOLE 40 MG PO CPDR Oral Take 40 mg by mouth 2 (two) times daily.    . OXYBUTYNIN CHLORIDE ER 5 MG PO TB24 Oral Take 5 mg by mouth daily.    Marland Kitchen SIMVASTATIN 10 MG PO TABS Oral Take 10 mg by mouth at bedtime.    . TRAMADOL HCL 50 MG PO TABS Oral Take 50 mg by mouth every 6 (six) hours as needed. For pain      BP 173/93  Pulse 113  Temp(Src) 98 F (36.7 C) (Oral)  Resp 34  Wt 220 lb 7.4 oz (100 kg)  SpO2 74%  Physical Exam  ED Course  INTUBATION Date/Time: 11/06/2011 11:00 PM Performed by: Warnell Forester Authorized by: Linwood Dibbles R Consent: The procedure was performed in an emergent situation. Indications: respiratory failure and airway protection (Cardiopulmonary arrest) Intubation method: video-assisted Patient status: unconscious Preoxygenation: BVM Tube size: 7.5  mm Tube type: cuffed Number of attempts: 1 Cords visualized: yes Post-procedure assessment: chest rise and CO2 detector Breath sounds: equal Cuff inflated: yes Tube secured with: ETT holder Chest x-ray findings: endotracheal tube too low Tube repositioned successfully: repositioned. Patient tolerance: Patient tolerated the procedure well with no immediate complications.   (including critical care time)  Labs Reviewed  CBC - Abnormal; Notable for the following:    WBC 14.9 (*)    Hemoglobin 11.2 (*)    HCT 34.7 (*)    All other components within normal limits  DIFFERENTIAL - Abnormal; Notable for the following:    Neutrophils Relative 79 (*)    Neutro Abs 11.8 (*)    All other components within normal limits  COMPREHENSIVE METABOLIC PANEL - Abnormal; Notable for the following:    Glucose, Bld 201 (*)    AST 45 (*)    ALT 59 (*)    Alkaline Phosphatase 150 (*)    All other components within normal limits  URINALYSIS, ROUTINE W REFLEX MICROSCOPIC - Abnormal; Notable for the following:    APPearance CLOUDY (*)    Glucose, UA 100 (*)    Protein, ur 100 (*)    Leukocytes, UA TRACE (*)  All other components within normal limits  POCT I-STAT, CHEM 8 - Abnormal; Notable for the following:    Glucose, Bld 388 (*)    Hemoglobin 15.3 (*)    All other components within normal limits  POCT I-STAT 3, BLOOD GAS (G3+) - Abnormal; Notable for the following:    pH, Arterial 6.882 (*)    pCO2 arterial 93.9 (*)    pO2, Arterial 58.0 (*)    Bicarbonate 17.8 (*)    Acid-base deficit 18.0 (*)    All other components within normal limits  POCT I-STAT, CHEM 8 - Abnormal; Notable for the following:    Potassium 3.4 (*)    Glucose, Bld 369 (*)    Hemoglobin 15.6 (*)    All other components within normal limits  URINE MICROSCOPIC-ADD ON - Abnormal; Notable for the following:    Squamous Epithelial / LPF MANY (*)    Bacteria, UA FEW (*)    All other components within normal limits  POCT  I-STAT 3, BLOOD GAS (G3+) - Abnormal; Notable for the following:    pH, Arterial 7.113 (*)    pCO2 arterial 62.0 (*)    pO2, Arterial 33.0 (*)    Bicarbonate 19.8 (*)    Acid-base deficit 11.0 (*)    All other components within normal limits  CK TOTAL AND CKMB  PROTIME-INR  APTT  TROPONIN I  POCT I-STAT TROPONIN I  POCT I-STAT TROPONIN I  PRO B NATRIURETIC PEPTIDE   Ct Angio Chest W/cm &/or Wo Cm  11/05/2011  *RADIOLOGY REPORT*  Clinical Data: Chest pain, shortness of breath  CT ANGIOGRAPHY CHEST  Technique:  Multidetector CT imaging of the chest using the standard protocol during bolus administration of intravenous contrast. Multiplanar reconstructed images including MIPs were obtained and reviewed to evaluate the vascular anatomy.  Contrast: OMNIPAQUE IOHEXOL 300 MG/ML IV SOLN . After contrast administration the patient became   severely short of breath, dizzy, code called, CPR started, the patient transferred to the ED.  Comparison: 01/16/2005  Findings: There is fairly good contrast opacification of pulmonary artery branches.  Images are degraded by body habitus and patient breathing during the acquisition.  No convincing filling defects are identified to suggest acute PE.  There is minimal contrast opacification of the thoracic aorta.  There are small pleural effusions, right greater than left.  No pericardial effusion. There is reflux of contrast from the right atrium into the hepatic veins suggesting right heart failure.  No hilar or mediastinal adenopathy.  Calcified left thyroid lesion is noted.  There are extensive ground-glass and airspace opacities throughout both lungs in a predominately perihilar distribution.  Flowing osteophytes in the mid and lower thoracic spine.  IMPRESSION:  1.  Extensive bilateral airspace edema or infiltrates. 2.  Small bilateral pleural effusions. 3.  No definite pulmonary emboli, with the exam limitations as above.  I telephoned the critical test results  to Dr. Lynelle Doctor at the time of interpretation.  Original Report Authenticated By: Osa Craver, M.D.   Dg Chest Port 1 View  11/05/2011  *RADIOLOGY REPORT*  Clinical Data: CPR, intubated  PORTABLE CHEST - 1 VIEW  Comparison: Early exam the same day  Findings: An endotracheal tube has been placed 2 cm into the right mainstem bronchus.  There are extensive bilateral airspace opacities with air bronchograms seen centrally.  Cannot exclude pleural effusions.  Heart size difficult to assess due to adjacent opacities.  Regional bones unremarkable.  IMPRESSION:  1.  Low position of the endotracheal tube into the right mainstem bronchus, should be retracted. I telephoned the critical test results to Dr. Lynelle Doctor at the time of interpretation. 2.  Extensive bilateral airspace edema or infiltrates.  Original Report Authenticated By: Osa Craver, M.D.     1. Cardiac arrest   2. Flash pulmonary edema       MDM          Warnell Forester, MD 11/06/11 [phone]

## 2011-11-06 NOTE — Progress Notes (Signed)
UR Completed.  Whitney [name] [name] [phone] 11/06/2011  

## 2011-11-06 NOTE — Progress Notes (Signed)
Guyla's sister and her sister's husband came to hospital just as pt was being transferred up to 2900.  Waited with them while they were getting pt situated. Family was anxious to be able to see Bernetta, but they were grateful for updates from the staff and for all that the staff were doing to care for [facility].  I provided emotional and spiritual support and pastoral presence while we waited.  Thornell Sartorius Pager, [phone] 3:07 AM   11/06/11 0300  Clinical Encounter Type  Visited With Family  Visit Type Critical Care

## 2011-11-06 NOTE — Progress Notes (Signed)
eLink Physician-Brief Progress Note Patient Name: Whitney [name] DOB: 1956/06/05 MRN: [id number]  Date of Service  11/06/2011   HPI/Events of Note   k 3.0 ionnized calcium 10.08 Sugars - not on ICU hyoperglycemia protocol  eICU Interventions  Correct K Correct calciuim Check mag and phos 11/07/11 Start ICU hyperglycemia protocol     Intervention Category Intermediate Interventions: Other:;Electrolyte abnormality - evaluation and management  [name] 11/06/2011, 3:50 PM

## 2011-11-06 NOTE — Progress Notes (Signed)
RT removed patients Rt radial aline because it would draw back and was pleth was flat. RT placed new aline RT radial per T.V.O.  DR Bard Herbert

## 2011-11-06 NOTE — Procedures (Signed)
Name:  KALIANNE FETTING MRN:  [id number] DOB:  August 11, 1956  PROCEDURE NOTE  Procedure:  Ultrasound-guided central venous catheter placement.  Indications:  Need for intravenous access and hemodynamic monitoring.  Consent:  Consent was implied due to the emergency nature of the procedure.  Anesthesia:  A total of 10 mL of 1% Lidocaine was used for local infiltration anesthesia.  Procedure summary:  Appropriate equipment was assembled.  The patient was identified as Virgel Manifold and safety timeout was performed. The patient was placed in Trendelenburg position.  Sterile technique was used. The patient's right neck region was prepped using chlorhexidine / alcohol scrub and the field was draped in usual sterile fashion with full body drape. The right internal jugular vein and the right carotid artery were identified by ultrasound, the patency was evaluated and images were documented. After the adequate anesthesia was achieved, the vein was cannulated with the introducer needle under sonographic guidance without difficulty. A guide wire was advanced through the introducer needle, which was then withdrawn. A small skin incision was made at the point of wire entry, the dilator was inserted over the guide wire and appropriate dilation was obtained. The dilator was removed and a triple-lumen catheter was advanced over the guide wire, which was then removed.  All ports were aspirated and flushed with normal saline without difficulty. The catheter was secured into place with sutures. Antibiotic patch was placed and sterile dressing was applied. Post-procedure chest x-ray was ordered.  Complications:  No immediate complications were noted.  Hemodynamic parameters and oxygenation remained stable throughout the procedure.  Estimated blood loss:  Less then 5 mL.  Orlean Bradford, M.D. Pulmonary and Critical Care Medicine [facility] Cell: [phone] Pager: [phone]  11/06/2011, 3:26 AM

## 2011-11-06 NOTE — Progress Notes (Signed)
RT called to room about Whitney [name] not reading correctly. RT attempted to zero and flush with no success. Removed tubing from catheter to see return of blood flow. Unsuccessful at attempts to save Whitney [name]. Order to re-insert Whitney [name] at this time.

## 2011-11-07 ENCOUNTER — Inpatient Hospital Stay (HOSPITAL_COMMUNITY): Payer: Medicare Other

## 2011-11-07 DIAGNOSIS — J9600 Acute respiratory failure, unspecified whether with hypoxia or hypercapnia: Secondary | ICD-10-CM | POA: Diagnosis not present

## 2011-11-07 DIAGNOSIS — I469 Cardiac arrest, cause unspecified: Secondary | ICD-10-CM | POA: Diagnosis not present

## 2011-11-07 DIAGNOSIS — Z09 Encounter for follow-up examination after completed treatment for conditions other than malignant neoplasm: Secondary | ICD-10-CM | POA: Diagnosis not present

## 2011-11-07 DIAGNOSIS — R918 Other nonspecific abnormal finding of lung field: Secondary | ICD-10-CM | POA: Diagnosis not present

## 2011-11-07 DIAGNOSIS — J810 Acute pulmonary edema: Secondary | ICD-10-CM | POA: Diagnosis not present

## 2011-11-07 DIAGNOSIS — R0602 Shortness of breath: Secondary | ICD-10-CM | POA: Diagnosis not present

## 2011-11-07 DIAGNOSIS — I5021 Acute systolic (congestive) heart failure: Secondary | ICD-10-CM | POA: Diagnosis not present

## 2011-11-07 DIAGNOSIS — I10 Essential (primary) hypertension: Secondary | ICD-10-CM | POA: Diagnosis not present

## 2011-11-07 LAB — POCT I-STAT 3, ART BLOOD GAS (G3+)
Acid-base deficit: 3 mmol/L — ABNORMAL HIGH (ref 0.0–2.0)
Bicarbonate: 21.4 mEq/L (ref 20.0–24.0)
pCO2 arterial: 30.3 mmHg — ABNORMAL LOW (ref 35.0–45.0)
pO2, Arterial: 67 mmHg — ABNORMAL LOW (ref 80.0–100.0)

## 2011-11-07 LAB — MAGNESIUM
Magnesium: 1.6 mg/dL (ref 1.5–2.5)
Magnesium: 1.6 mg/dL (ref 1.5–2.5)

## 2011-11-07 LAB — BASIC METABOLIC PANEL
BUN: 14 mg/dL (ref 6–23)
BUN: 17 mg/dL (ref 6–23)
BUN: 17 mg/dL (ref 6–23)
BUN: 18 mg/dL (ref 6–23)
BUN: 18 mg/dL (ref 6–23)
CO2: 22 mEq/L (ref 19–32)
CO2: 22 mEq/L (ref 19–32)
CO2: 21 mEq/L (ref 19–32)
CO2: 22 mEq/L (ref 19–32)
CO2: 24 mEq/L (ref 19–32)
Calcium: 8.9 mg/dL (ref 8.4–10.5)
Calcium: 9 mg/dL (ref 8.4–10.5)
Chloride: 107 mEq/L (ref 96–112)
Chloride: 109 mEq/L (ref 96–112)
Chloride: 110 mEq/L (ref 96–112)
Chloride: 110 mEq/L (ref 96–112)
Chloride: 110 mEq/L (ref 96–112)
Chloride: 112 mEq/L (ref 96–112)
Creatinine, Ser: 0.51 mg/dL (ref 0.50–1.10)
Creatinine, Ser: 0.52 mg/dL (ref 0.50–1.10)
Creatinine, Ser: 0.53 mg/dL (ref 0.50–1.10)
GFR calc Af Amer: >90 mL/min (ref >90)
GFR calc Af Amer: >90 mL/min (ref >90)
GFR calc Af Amer: >90 mL/min (ref >90)
GFR calc non Af Amer: >90 mL/min (ref >90)
Glucose, Bld: 168 mg/dL — ABNORMAL HIGH (ref 70–99)
Glucose, Bld: 174 mg/dL — ABNORMAL HIGH (ref 70–99)
Glucose, Bld: 176 mg/dL — ABNORMAL HIGH (ref 70–99)
Glucose, Bld: 208 mg/dL — ABNORMAL HIGH (ref 70–99)
Glucose, Bld: 212 mg/dL — ABNORMAL HIGH (ref 70–99)
Potassium: 3.5 mEq/L (ref 3.5–5.1)
Potassium: 3.6 mEq/L (ref 3.5–5.1)
Potassium: 3.6 mEq/L (ref 3.5–5.1)
Potassium: 3.6 mEq/L (ref 3.5–5.1)
Potassium: 3.6 mEq/L (ref 3.5–5.1)
Potassium: 3.8 mEq/L (ref 3.5–5.1)
Potassium: 3.8 mEq/L (ref 3.5–5.1)
Sodium: 142 mEq/L (ref 135–145)
Sodium: 142 mEq/L (ref 135–145)
Sodium: 144 mEq/L (ref 135–145)
Sodium: 143 mEq/L (ref 135–145)
Sodium: 143 mEq/L (ref 135–145)

## 2011-11-07 LAB — GLUCOSE, CAPILLARY
Glucose-Capillary: 193 mg/dL — ABNORMAL HIGH (ref 70–99)
Glucose-Capillary: 159 mg/dL — ABNORMAL HIGH (ref 70–99)
Glucose-Capillary: 132 mg/dL — ABNORMAL HIGH (ref 70–99)
Glucose-Capillary: 126 mg/dL — ABNORMAL HIGH (ref 70–99)

## 2011-11-07 LAB — PHOSPHORUS: Phosphorus: 3.3 mg/dL (ref 2.3–4.6)

## 2011-11-07 MED ORDER — NITROGLYCERIN IN D5W 200-5 MCG/ML-% IV SOLN: 2.0000 ug/min | INTRAVENOUS | Status: DC

## 2011-11-07 MED ORDER — FUROSEMIDE 10 MG/ML IJ SOLN
40.0000 mg | Freq: Three times a day (TID) | INTRAMUSCULAR | Status: DC
  Filled 2011-11-07 (×2): qty 4

## 2011-11-07 MED ORDER — FUROSEMIDE 10 MG/ML IJ SOLN
80.0000 mg | Freq: Three times a day (TID) | INTRAMUSCULAR | Status: AC
  Administered 2011-11-07 (×2): 80 mg via INTRAVENOUS
  Filled 2011-11-07 (×2): qty 8

## 2011-11-07 MED ORDER — FENTANYL CITRATE 0.05 MG/ML IJ SOLN
50.0000 ug | INTRAMUSCULAR | Status: DC | PRN
  Administered 2011-11-08 – 2011-11-10 (×8): 100 ug via INTRAVENOUS
  Filled 2011-11-07 (×8): qty 2

## 2011-11-07 MED ORDER — MIDAZOLAM HCL 2 MG/2ML IJ SOLN
2.0000 mg | INTRAMUSCULAR | Status: DC | PRN
  Administered 2011-11-08 (×2): 2 mg via INTRAVENOUS
  Filled 2011-11-07 (×3): qty 2

## 2011-11-07 NOTE — Progress Notes (Signed)
Pt core temp 33.5 celsius. Tylenol given per OG tube for possible underlying fever. Will continue to monitor pt.  Perkins, [place] Elizabeth

## 2011-11-07 NOTE — Progress Notes (Signed)
Name: Whitney [name] MRN: [id number] DOB: 24-Jan-1956    LOS: 2  PCCM ADMISSION NOTE  History of Present Illness: 56 yo AAF with recent history of dyspnea, chest pain and left leg pain sent to [facility] ED to r/o PE.  Suffered PEA cardiac arrest in CT scanner, was intubated and successfully resuscitated.  Subsequently developed hypertensive emergency associated with pulmonary edema and severe hypoxia.  Improved with Lasix / Nitroglycerine.  Brought to 2900 for hypothermia protocol.  Lines / Drains: 2/4  OETT 2/4  OGT 2/4  Foley 2/5  R IJ TLC  Cultures: 2/5  Blood 2/5  Sputum 2/5 Ur str/leg ag >> neg  Antibiotics: 2/5  Ceftriaxone 2/5  Levofloxacin  Tests / Events: 2/4  Chest CTA>>>Bilateral airspace disease, bilateral effusions, no PE 2/4 2D TTE >> LVEF 30-35%, LVH, RV mildly dilated, RAE   OVERNIGHT: Blood pressure has been very labile, off and on levophed and intermittently hypertensive  Vital Signs: Temp:  [90.1 F (32.3 C)-96.3 F (35.7 C)] 91.6 F (33.1 C) (02/06 0700) Pulse Rate:  [40-84] 45  (02/06 0700) Resp:  [4-35] 35  (02/06 0700) BP: (128-145)/(63-98) 134/76 mmHg (02/06 0343) SpO2:  [92 %-100 %] 100 % (02/06 0700) Arterial Line BP: (108-163)/(65-128) 128/65 mmHg (02/06 0700) FiO2 (%):  [49.9 %-100 %] 50 % (02/06 0700) Weight:  [149.1 kg (328 lb 11.3 oz)] 149.1 kg (328 lb 11.3 oz) (02/06 0600) I/O last 3 completed shifts: In: 3287.4 [I.V.:2577.4; IV Piggyback:710] Out: 6615 [Urine:6615]  Physical Examination: General:  Intubated, mechanically ventilated, morbidly obese, no distress Neuro:  Sedated, synchronous, nonfocal HEENT:  PERRL, pink conjunctivae, moist membranes Neck:  Cannot assess JVD Cardiovascular:  RRR, no M/R/G Lungs:  Bilateral very diminished air entry, no W/R/R, pink frothy secretions via ETT  Abdomen:  Obese, bowel sounds present Musculoskeletal:  Chronic stasis  / edema  Skin:  No rash  Ventilator settings: Vent Mode:  [-] PRVC FiO2 (%):   [49.9 %-100 %] 50 % Set Rate:  [35 bmp] 35 bmp Vt Set:  [320 mL-460 mL] 320 mL PEEP:  [8 cmH20-20 cmH20] 8 cmH20 Plateau Pressure:  [26 cmH20-40 cmH20] 26 cmH20  Labs and Imaging:    CBC    Component Value Date/Time   WBC 14.9* 11/05/2011 2101   RBC 4.24 11/05/2011 2101   HGB 13.9 11/06/2011 1536   HCT 41.0 11/06/2011 1536   PLT 299 11/05/2011 2101   MCV 81.8 11/05/2011 2101   MCH 26.4 11/05/2011 2101   MCHC 32.3 11/05/2011 2101   RDW 14.6 11/05/2011 2101   LYMPHSABS 2.2 11/05/2011 2101   MONOABS 0.8 11/05/2011 2101   EOSABS 0.0 11/05/2011 2101   BASOSABS 0.0 11/05/2011 2101    BMET    Component Value Date/Time   NA 141 11/07/2011 0355   K 4.0 11/07/2011 0355   CL 110 11/07/2011 0355   CO2 22 11/07/2011 0355   GLUCOSE 211* 11/07/2011 0355   BUN 18 11/07/2011 0355   CREATININE 0.52 11/07/2011 0355   CALCIUM 8.9 11/07/2011 0355   GFRNONAA >90 11/07/2011 0355   GFRAA >90 11/07/2011 0355      ASSESSMENT AND PLAN  56 y/o female s/p respiratory arrest after coming to the [facility] ED for evaluation of dyspnea, chest pain and leg pain.  Had PEA arrest requiring CPR for 10 minutes, hypertensive afterwards.    NEUROLOGIC A:  High risk for anoxic brain injury.   P: -->  Start re-warming today at 1000 -->  Versed /  Fentanyl -->  wua tomorrow  PULMONARY  Lab 11/07/11 0349 11/06/11 2356 11/06/11 1441 11/06/11 0347 11/06/11  PHART 7.436* 7.449* 7.485* 7.320* 7.113*  PCO2ART 30.3* 32.7* 26.5* 42.1 62.0*  PO2ART 67.0* 60.0* 426.0* 64.0* 33.0*  HCO3 21.4 23.5 20.5 21.5 19.8*  O2SAT 97.0 95.0 100.0 89.0 43.0   A:  Hypercarbic hypoxemic acute respiratory failure, most likely due to acute pulmonary edema.  Suspect baseline OSA / OHS. Improved oxygenation today with diuresis P: -->  Full mechanical support -->  Daily ABG --> retract ETT 3cm -->  ARDS protocol  --> continue diuresis  CARDIOVASCULAR  Lab 11/06/11 1955 11/06/11 1345 11/06/11 0935 11/05/11 2102  TROPONINI 0.46* 0.60* 0.85* <0.30  LATICACIDVEN -- -- --  --   A:  Status post PEA arrest, etiology is not clear, likely hypertensive emergency with flash pulmonary edema.  Initially hypertensive post arrest.  Pressure has been labile on hypothermia protocol but renal function stable and oxygenation improving with diuresis.  P: -->  Consider nitroglycerine if BP remains elevated today -->  suspect hypotension is related to CHF and hypothermia, rewarming starts this morning -->  Monitor BP , goal SBP <150 --> appreciate cardiology consult -->diurese again today --> start lisinopril 10mg  today if persistently hypertensive  RENAL  Lab 11/07/11 0355 11/07/11 0110 11/06/11 1955 11/06/11 1536 11/06/11 1345 11/06/11 0935  NA 141 142 141 143 139 --  K 4.0 3.6 -- -- -- --  CL 110 112 110 111 105 --  CO2 22 22 20  -- 17* 20  GLUCOSE 211* 206* 234* 268* 245* --  BUN 18 17 17 16 18  --  CREATININE 0.52 0.51 0.55 0.60 0.49* --  CALCIUM 8.9 8.6 8.9 -- 8.6 8.5  MG 1.6 -- -- -- -- --  PHOS 3.3 -- -- -- -- --   A:  Normal renal function.  Hypokalemia. P: -->  Replace K --> check Mg -->  Repeat BMP at 1400 today  GASTROINTESTINAL  Lab 11/06/11 1345 11/05/11 2101  AST 69* 45*  ALT 89* 59*  ALKPHOS 156* 150*  BILITOT 1.2 0.8  PROT 6.7 7.3  ALBUMIN 2.9* 3.5   A:  Mildly elevated liver enzymes, possible shocked liver.  Improving. P: -->  monitor  HEMATOLOGIC  Lab 11/06/11 1536 11/06/11 1230 11/06/11 0244 11/05/11 2330 11/05/11 2303 11/05/11 2101  HGB 13.9 -- -- 15.6* 15.3* 11.2*  HCT 41.0 -- -- 46.0 45.0 34.7*  PLT -- -- -- -- -- 299  INR -- 1.17 1.11 -- -- 1.01  APTT -- 28 29 -- -- 34   A:  No active issues P: -->  Monitor cbc  INFECTIOUS  Lab 11/06/11 0329 11/05/11 2101  WBC -- 14.9*  PROCALCITON 3.76 --   A:  Mildly elevated WBC.  No clear source of infection.  Pneumonia is a possibility though cardiogenic edema is more likely. P: -->  Ceftriaxone / Levofloxacin until cultures back, likely d/c on 2/7 (no WBC this AM) -->   Sputum / blood cultures -->  Repeat CBC  ENDOCRINE  Lab 11/07/11 0717 11/07/11 0354 11/06/11 2326 11/06/11 2233 11/06/11 1942  GLUCAP 159* 193* 149* 168* 190*   A:  Diabetes / Hyperglycemia P: -->  Insulin gtt  BEST PRACTICE / DISPOSITION -->  ICU status under PCCM -->  Full code -->  Start tube feeding after rewarming -->  Heparin [place] for DVT Px -->  Protonix IV for GI Px -->  Ventilator bundle -->  Family Lucius Conn, brother)  notified by phone by me this morning.  The patient is critically ill with multiple organ systems failure and requires high complexity decision making for assessment and support, frequent evaluation and titration of therapies, application of advanced monitoring technologies and extensive interpretation of multiple databases. Critical Care Time devoted to patient care services described in this note is 30 minutes.  Max Fickle Pulmonary and Critical Care Medicine [facility] Pager: [phone]  11/07/2011, 7:48 AM

## 2011-11-07 NOTE — Progress Notes (Signed)
Midnight BMET drawn, signed, dated, sent to lab on schedule. Tube was misplaced by lab. Notified at 0100 that tube was missing. Lab re-drawn and re-sent.  Perkins, [place] Elizabeth

## 2011-11-07 NOTE — Progress Notes (Signed)
Subjective:  55 year with PEA arrest, acute systolic heart failure.  -BP's are labile.  -Sedate on vent.  Objective:  Vital Signs in the last 24 hours: Temp:  [90.1 F (32.3 C)-94 F (34.4 C)] 91.2 F (32.9 C) (02/06 0800) Pulse Rate:  [40-84] 57  (02/06 0800) Resp:  [4-35] 35  (02/06 0800) BP: (134-145)/(63-80) 134/76 mmHg (02/06 0343) SpO2:  [92 %-100 %] 100 % (02/06 0800) Arterial Line BP: (108-174)/(65-96) 174/93 mmHg (02/06 0800) FiO2 (%):  [49.9 %-100 %] 50.2 % (02/06 0800) Weight:  [149.1 kg (328 lb 11.3 oz)] 149.1 kg (328 lb 11.3 oz) (02/06 0600)  Intake/Output from previous day: 02/05 0701 - 02/06 0700 In: 2967.6 [I.V.:2307.6; IV Piggyback:660] Out: 4400 [Urine:4400]   Physical Exam: GEN: sedate on vent CV: RRR, no M LUNGS: scattered rhonchi ABD: soft, obese EXT: chronic edema   Lab Results:  Basename 11/06/11 1536 11/05/11 2330 11/05/11 2101  WBC -- -- 14.9*  HGB 13.9 15.6* --  PLT -- -- 299    Basename 11/07/11 0355 11/07/11 0110  NA 141 142  K 4.0 3.6  CL 110 112  CO2 22 22  GLUCOSE 211* 206*  BUN 18 17  CREATININE 0.52 0.51    Basename 11/06/11 1955 11/06/11 1345  TROPONINI 0.46* 0.60*   Hepatic Function Panel  Basename 11/06/11 1345  PROT 6.7  ALBUMIN 2.9*  AST 69*  ALT 89*  ALKPHOS 156*  BILITOT 1.2  BILIDIR --  IBILI --   No results found for this basename: CHOL in the last 72 hours No results found for this basename: PROTIME in the last 72 hours  Imaging: Ct Angio Chest W/cm &/or Wo Cm  11/05/2011  *RADIOLOGY REPORT*  Clinical Data: Chest pain, shortness of breath  CT ANGIOGRAPHY CHEST  Technique:  Multidetector CT imaging of the chest using the standard protocol during bolus administration of intravenous contrast. Multiplanar reconstructed images including MIPs were obtained and reviewed to evaluate the vascular anatomy.  Contrast: OMNIPAQUE IOHEXOL 300 MG/ML IV SOLN . After contrast administration the patient became    severely short of breath, dizzy, code called, CPR started, the patient transferred to the ED.  Comparison: 01/16/2005  Findings: There is fairly good contrast opacification of pulmonary artery branches.  Images are degraded by body habitus and patient breathing during the acquisition.  No convincing filling defects are identified to suggest acute PE.  There is minimal contrast opacification of the thoracic aorta.  There are small pleural effusions, right greater than left.  No pericardial effusion. There is reflux of contrast from the right atrium into the hepatic veins suggesting right heart failure.  No hilar or mediastinal adenopathy.  Calcified left thyroid lesion is noted.  There are extensive ground-glass and airspace opacities throughout both lungs in a predominately perihilar distribution.  Flowing osteophytes in the mid and lower thoracic spine.  IMPRESSION:  1.  Extensive bilateral airspace edema or infiltrates. 2.  Small bilateral pleural effusions. 3.  No definite pulmonary emboli, with the exam limitations as above.  I telephoned the critical test results to Dr. Lynelle Doctor at the time of interpretation.  Original Report Authenticated By: Osa Craver, M.D.   Dg Chest Port 1 View  11/07/2011  *RADIOLOGY REPORT*  Clinical Data: Evaluate endotracheal tube placement.  Shortness of breath.  PORTABLE CHEST - 1 VIEW  Comparison: Multiple priors, most recently 11/06/2011.  Findings: Previously noted left internal jugular central venous catheters been removed.  Endotracheal tube tip has  been advanced, now only 6 mm above the carina.  There continues to be diffuse interstitial and airspace opacities throughout all aspects the lungs bilaterally.  Mild-moderate cardiomegaly is unchanged. The patient is rotated to the left on today's exam, resulting in distortion of the mediastinal contours and reduced diagnostic sensitivity and specificity for mediastinal pathology.  .  IMPRESSION: 1.  Support apparatus, as  above.  Please take note of low-lying endotracheal tube (6 mm above the carina), and consider withdrawal approximate 3-4 cm for more optimal placement. 2.  Otherwise, the radiographic appearance of chest is essentially unchanged, likely indicative of severe pulmonary edema (possibly with small bilateral pleural effusions).  This was made a call report.  Original Report Authenticated By: Florencia Reasons, M.D.   Dg Chest Port 1 View  11/06/2011  *RADIOLOGY REPORT*  Clinical Data: Central line placement  PORTABLE CHEST - 1 VIEW  Comparison: 11/06/2011  Findings: Interval right IJ catheter placement with tip projecting over the mid SVC.  No pneumothorax.  Otherwise no interval change.  IMPRESSION: Interval right IJ catheter placement, tip projecting over the mid SVC.  No pneumothorax.  Original Report Authenticated By: Waneta Martins, M.D.   Dg Chest Port 1 View  11/06/2011  *RADIOLOGY REPORT*  Clinical Data: Central line placement  PORTABLE CHEST - 1 VIEW  Comparison: 11/06/2011  Findings: Interval retraction of the left IJ catheter, with tip now projecting over the left brachiocephalic vein.  Otherwise, no significant interval change.  Endotracheal tube tip is positioned 1.5 cm proximal the carina.  NG tube descends into the abdomen, tip not visualized.  Cardiomegaly.  Central vascular congestion. Bilateral airspace opacities and likely layering pleural effusions. No pneumothorax.  No acute osseous abnormality.  IMPRESSION: Interval left IJ catheter retraction with tip now projecting over the left brachiocephalic vein.  Endotracheal tube tip positioned 1.5 cm proximal to the carina.  Cardiomegaly.  Bilateral airspace opacities/advanced edema pattern and probable effusions, similar to prior.  Original Report Authenticated By: Waneta Martins, M.D.   Dg Chest Port 1 View  11/06/2011  *RADIOLOGY REPORT*  Clinical Data: Central line placement  PORTABLE CHEST - 1 VIEW  Comparison: 11/05/2011  Findings:  Interval left IJ catheter placement, with catheter crosses midline and tip projecting over the right brachiocephalic vein.  Endotracheal tube tip is positioned 1 cm proximal to the carina.  Cardiomegaly.  Bilateral opacities and effusions.  No pneumothorax. NG tube descends into the abdomen, tip not visualized.  IMPRESSION: Left IJ catheter crosses midline, with tip positioned over the right brachiocephalic vein.  Endotracheal tube tip positioned 1 cm proximal to the carina.  Bilateral airspace opacities and effusions.  Original Report Authenticated By: Waneta Martins, M.D.   Dg Chest Port 1 View  11/05/2011  *RADIOLOGY REPORT*  Clinical Data: CPR, intubated  PORTABLE CHEST - 1 VIEW  Comparison: Early exam the same day  Findings: An endotracheal tube has been placed 2 cm into the right mainstem bronchus.  There are extensive bilateral airspace opacities with air bronchograms seen centrally.  Cannot exclude pleural effusions.  Heart size difficult to assess due to adjacent opacities.  Regional bones unremarkable.  IMPRESSION:  1.  Low position of the endotracheal tube into the right mainstem bronchus, should be retracted. I telephoned the critical test results to Dr. Lynelle Doctor at the time of interpretation. 2.  Extensive bilateral airspace edema or infiltrates.  Original Report Authenticated By: Osa Craver, M.D.   Personally viewed.   Telemetry: Normal,  no VT Personally viewed.   EKG:  No changes  Cardiac Studies:  EF 30%  Assessment/Plan:   PEA arrest, hypothermia protocol, acute systolic heart failure, acute resp failure  -Continue with aggressive diuresis, increase to 80IV -Labile BP likely secondary to hypothermia. If BP remains elevated, start lisinopril 10mg  for afterload reduction. Beta blocker when more euvolemic. NTG gtt OK for support.  -CXR - edema -Rewarm today -Mild Troponin ( in setting of arrest)     Jahmai Finelli 11/07/2011, 8:12 AM

## 2011-11-07 NOTE — Progress Notes (Signed)
INITIAL ADULT NUTRITION ASSESSMENT Date: 11/07/2011   Time: 10:00 AM  Reason for Assessment: TF consult  ASSESSMENT: Female 56 y.o.  Dx: PEA cardiac arrest  Hx:  Past Medical History  Diagnosis Date  . Diabetes mellitus   . Hypertension     Related Meds:     . antiseptic oral rinse  15 mL Mouth Rinse QID  . artificial tears  1 application Both Eyes Q8H  . calcium gluconate  1 g Intravenous Once  . cefTRIAXone (ROCEPHIN)  IV  2 g Intravenous Q24H  . chlorhexidine  15 mL Mouth Rinse BID  . furosemide  40 mg Intravenous Q6H  . furosemide  80 mg Intravenous Q8H  . heparin  5,000 Units Subcutaneous Q8H  . insulin aspart  0-4 Units Subcutaneous Q4H  . levofloxacin (LEVAQUIN) IV  750 mg Intravenous Q24H  . midazolam      . pantoprazole (PROTONIX) IV  40 mg Intravenous Q24H  . potassium chloride  10 mEq Intravenous Q1 Hr x 4  . potassium chloride      . potassium chloride  40 mEq Per Tube Once  . potassium chloride      . vecuronium      . DISCONTD: cisatracurium  0.1 mg/kg Intravenous Once  . DISCONTD: fentaNYL  100 mcg Intravenous Once  . DISCONTD: furosemide  40 mg Intravenous Q8H  . DISCONTD: midazolam  2 mg Intravenous Once     Ht: 5\' 3"  (160 cm)  Wt: 328 lb 11.3 oz (149.1 kg)  Ideal Wt: 52.3 kg  % Ideal Wt: 285%  Usual Wt: Unable to attain- weight has 100 lbs difference throughout admission. RN confirmed 149 kg as actual weight. % Usual Wt: unable to attain  Body mass index is 58.23 kg/(m^2). Class III Obesity  Food/Nutrition Related Hx: Patient is sedated, intubated, and unable to provide history. No family members are present  Patient has stage II pressure ulcer on right breast, stage I pressure ulcer on left breast, and stage II pressure ulcer on right elbow  Labs:  CMP     Component Value Date/Time   NA 142 11/07/2011 0745   NA 142 11/07/2011 0745   K 3.8 11/07/2011 0745   K 3.8 11/07/2011 0745   CL 110 11/07/2011 0745   CL 109 11/07/2011 0745   CO2 22  11/07/2011 0745   CO2 21 11/07/2011 0745   GLUCOSE 208* 11/07/2011 0745   GLUCOSE 205* 11/07/2011 0745   BUN 18 11/07/2011 0745   BUN 18 11/07/2011 0745   CREATININE 0.50 11/07/2011 0745   CREATININE 0.55 11/07/2011 0745   CALCIUM 9.0 11/07/2011 0745   CALCIUM 9.0 11/07/2011 0745   PROT 6.7 11/06/2011 1345   ALBUMIN 2.9* 11/06/2011 1345   AST 69* 11/06/2011 1345   ALT 89* 11/06/2011 1345   ALKPHOS 156* 11/06/2011 1345   BILITOT 1.2 11/06/2011 1345   GFRNONAA >90 11/07/2011 0745   GFRNONAA >90 11/07/2011 0745   GFRAA >90 11/07/2011 0745   GFRAA >90 11/07/2011 0745   CBG (last 3)   Basename 11/07/11 0717 11/07/11 0354 11/06/11 2326  GLUCAP 159* 193* 149*    Intake:   Intake/Output Summary (Last 24 hours) at 11/07/11 1003 Last data filed at 11/07/11 0900  Gross per 24 hour  Intake 2678.3 ml  Output   2800 ml  Net -121.7 ml   Diet Order: NPO  Supplements/Tube Feeding:  IVF:    sodium chloride Last Rate: 10 mL/hr at 11/06/11 2000  sodium  chloride Last Rate: 10 mL/hr at 11/06/11 2000  cisatracurium (NIMBEX) infusion Last Rate: 1.5 mcg/kg/min (11/07/11 0700)  dextrose   fentaNYL infusion INTRAVENOUS Last Rate: 350 mcg/hr (11/07/11 0547)  insulin (NOVOLIN-R) infusion Last Rate: 1 mL/hr at 11/06/11 1400  midazolam (VERSED) infusion Last Rate: 6 mg/hr (11/07/11 0855)  nitroGLYCERIN   norepinephrine (LEVOPHED) Adult infusion Last Rate: 3 mcg/min (11/07/11 0911)    Estimated Nutritional Needs:   Kcal:2280-2380   Underfeeding kcal goal per ASPEN guidelines: 1500 - 1600 Protein:120 -130 gm Fluid:> 2.2 L  To follow ASPEN guidelines for permissively underfeeding the obese, the goal of EN regimen should not exceed 60 - 70 % of target energy requirements (22-25 kcal/kg IBW or 11-14 kcal/kg ABW). Protein should be provided at 100% estimated needs.  Noted pt currently on hypothermia protocol and is not rewarmed at this time. RD consult for initiation of TF once pt is rewarmed. Discussed TF plan with  RN>  NUTRITION DIAGNOSIS: -Inadequate oral intake (NI-2.1).  Status: Ongoing  RELATED TO: inability to eat  AS EVIDENCE BY: NPO status  MONITORING/EVALUATION(Goals): Goal: Provide 60-70% of estimated needs, and 100% protein needs per ASPEN guidelines for permissive underfeeding Monitor: TF tolerance, weights, labs  EDUCATION NEEDS: -No education needs identified at this time  INTERVENTION:  Once patient is re-warmed, initiate Osmolite 1.2 at 20 ml/hr and advance 10 ml/hr every four hours to goal rate of 40 ml/hr via enteral tube. This will provide 1152 kcal, 53.3 grams protein, and 787 ml H20  Add six 30 ml Pro-Stat to provide 360 kcal, and 75 gram protein  Overall this will provide 1512 kcal (66% estimated needs), 128 grams protein (100% of protein needs), and 787 ml H20  RD to follow nutrition care plan  Dietitian #: [phone]  DOCUMENTATION CODES Per approved criteria  -Morbid Obesity    Lloyd Huger 11/07/2011, 10:00 AM  Adair Laundry RD Pager #: [phone]

## 2011-11-07 NOTE — Progress Notes (Signed)
eLink Physician-Brief Progress Note Patient Name: Whitney [name] DOB: 07-Jan-1956 MRN: [id number]  Date of Service  11/07/2011   HPI/Events of Note   RN says patient will need prn sedation and map goal changed when patient comes off arctic sun  eICU Interventions  Prn sedation and map >65 ordered   Intervention Category Intermediate Interventions: Communication with other healthcare providers and/or family  [name] 11/07/2011, 11:03 PM

## 2011-11-08 ENCOUNTER — Inpatient Hospital Stay (HOSPITAL_COMMUNITY): Payer: Medicare Other

## 2011-11-08 DIAGNOSIS — R918 Other nonspecific abnormal finding of lung field: Secondary | ICD-10-CM | POA: Diagnosis not present

## 2011-11-08 DIAGNOSIS — J810 Acute pulmonary edema: Secondary | ICD-10-CM

## 2011-11-08 DIAGNOSIS — J811 Chronic pulmonary edema: Secondary | ICD-10-CM | POA: Diagnosis not present

## 2011-11-08 DIAGNOSIS — I469 Cardiac arrest, cause unspecified: Secondary | ICD-10-CM | POA: Diagnosis not present

## 2011-11-08 DIAGNOSIS — J9600 Acute respiratory failure, unspecified whether with hypoxia or hypercapnia: Secondary | ICD-10-CM

## 2011-11-08 DIAGNOSIS — I10 Essential (primary) hypertension: Secondary | ICD-10-CM

## 2011-11-08 DIAGNOSIS — Z09 Encounter for follow-up examination after completed treatment for conditions other than malignant neoplasm: Secondary | ICD-10-CM | POA: Diagnosis not present

## 2011-11-08 DIAGNOSIS — I5021 Acute systolic (congestive) heart failure: Secondary | ICD-10-CM | POA: Diagnosis not present

## 2011-11-08 LAB — BLOOD GAS, ARTERIAL
Acid-Base Excess: 2.7 mmol/L — ABNORMAL HIGH (ref 0.0–2.0)
Drawn by: 225631
MECHVT: 400 mL
Patient temperature: 98.9
RATE: 18 resp/min
pCO2 arterial: 40.4 mmHg (ref 35.0–45.0)
pH, Arterial: 7.435 — ABNORMAL HIGH (ref 7.350–7.400)

## 2011-11-08 LAB — BASIC METABOLIC PANEL
BUN: 15 mg/dL (ref 6–23)
CO2: 26 mEq/L (ref 19–32)
Calcium: 9.1 mg/dL (ref 8.4–10.5)
Chloride: 107 mEq/L (ref 96–112)
GFR calc Af Amer: >90 mL/min (ref >90)
GFR calc non Af Amer: >90 mL/min (ref >90)
Glucose, Bld: 150 mg/dL — ABNORMAL HIGH (ref 70–99)
Potassium: 3.6 mEq/L (ref 3.5–5.1)
Potassium: 3.7 mEq/L (ref 3.5–5.1)
Sodium: 143 mEq/L (ref 135–145)
Sodium: 143 mEq/L (ref 135–145)

## 2011-11-08 LAB — GLUCOSE, CAPILLARY
Glucose-Capillary: 146 mg/dL — ABNORMAL HIGH (ref 70–99)
Glucose-Capillary: 166 mg/dL — ABNORMAL HIGH (ref 70–99)

## 2011-11-08 LAB — CULTURE, RESPIRATORY W GRAM STAIN

## 2011-11-08 LAB — POCT I-STAT 3, ART BLOOD GAS (G3+)
Bicarbonate: 25.7 mEq/L — ABNORMAL HIGH (ref 20.0–24.0)
O2 Saturation: 95 %
Patient temperature: 99.5
pCO2 arterial: 43.8 mmHg (ref 35.0–45.0)
pO2, Arterial: 82 mmHg (ref 80.0–100.0)

## 2011-11-08 LAB — CBC
Hemoglobin: 11.4 g/dL — ABNORMAL LOW (ref 12.0–15.0)
Platelets: 248 10*3/uL (ref 150–400)
RBC: 4.31 MIL/uL (ref 3.87–5.11)
WBC: 13.7 10*3/uL — ABNORMAL HIGH (ref 4.0–10.5)

## 2011-11-08 LAB — MAGNESIUM: Magnesium: 1.7 mg/dL (ref 1.5–2.5)

## 2011-11-08 MED ORDER — METOPROLOL SUCCINATE ER 25 MG PO TB24
25.0000 mg | ORAL_TABLET | Freq: Every day | ORAL | Status: DC
  Administered 2011-11-08: 25 mg via ORAL
  Filled 2011-11-08 (×2): qty 1

## 2011-11-08 MED ORDER — POTASSIUM CHLORIDE 20 MEQ/15ML (10%) PO LIQD
ORAL | Status: AC
  Administered 2011-11-08: 20 meq
  Filled 2011-11-08: qty 15

## 2011-11-08 MED ORDER — PRO-STAT SUGAR FREE PO LIQD
30.0000 mL | Freq: Every day | ORAL | Status: DC
  Administered 2011-11-08 – 2011-11-09 (×5): 30 mL
  Filled 2011-11-08 (×12): qty 30

## 2011-11-08 MED ORDER — LISINOPRIL 5 MG PO TABS
5.0000 mg | ORAL_TABLET | Freq: Every day | ORAL | Status: DC
  Administered 2011-11-08: 5 mg via ORAL
  Filled 2011-11-08 (×2): qty 1

## 2011-11-08 MED ORDER — OSMOLITE 1.2 CAL PO LIQD
1000.0000 mL | ORAL | Status: DC
  Administered 2011-11-08: 1000 mL
  Filled 2011-11-08 (×3): qty 1000

## 2011-11-08 MED ORDER — POTASSIUM CHLORIDE 20 MEQ/15ML (10%) PO LIQD
40.0000 meq | Freq: Two times a day (BID) | ORAL | Status: DC
  Administered 2011-11-08: 20 meq
  Administered 2011-11-08: 40 meq
  Filled 2011-11-08 (×4): qty 30

## 2011-11-08 MED ORDER — FUROSEMIDE 10 MG/ML IJ SOLN
80.0000 mg | Freq: Three times a day (TID) | INTRAMUSCULAR | Status: AC
  Administered 2011-11-08 – 2011-11-09 (×3): 80 mg via INTRAVENOUS
  Filled 2011-11-08 (×3): qty 8

## 2011-11-08 NOTE — Progress Notes (Signed)
WIS - fentanyl ; versed 30mL witness by Tereso Newcomer, RN.  [name], Whitney Elizabeth

## 2011-11-08 NOTE — Progress Notes (Signed)
Brief Nutrition Note  This RD spoke with RN on duty. Pt is currently re-warmed. RD ordered TF per consult; see full Nutrition Assessment on 11/07/11.  Plan:   Initiate Osmolite 1.2 at 20 ml/hr and advance 10 ml/hr every four hours to goal rate of 40 ml/hr via enteral tube. Add six 30 ml Pro-Stat to provide 360 kcal, and 75 gram protein.  Overall this will provide 1512 kcal (66% estimated needs), 128 grams protein (100% of protein needs), and 787 ml H20   RD to follow nutrition care plan  Adair Laundry Pager#: [phone]

## 2011-11-08 NOTE — Progress Notes (Signed)
Subjective:  56 year old PEA arrest, acute systolic HF, resp failure s/p hpothermia protocol.   Follows commands, looking at me. PEEP 5.   Objective:  Vital Signs in the last 24 hours: Temp:  [91.2 F (32.9 C)-100 F (37.8 C)] 99.9 F (37.7 C) (02/07 0700) Pulse Rate:  [45-114] 114  (02/07 0700) Resp:  [0-35] 29  (02/07 0700) BP: (123-182)/(66-93) 148/73 mmHg (02/07 0421) SpO2:  [95 %-100 %] 100 % (02/07 0700) Arterial Line BP: (116-180)/(62-94) 127/65 mmHg (02/07 0700) FiO2 (%):  [39.8 %-40.3 %] 40 % (02/07 0700)  Intake/Output from previous day: 02/06 0701 - 02/07 0700 In: 1551.5 [I.V.:1351.5; IV Piggyback:200] Out: 4200 [Urine:4200]   Physical Exam: More alert (off sedation), follows commands Tachy RR, no m CVP - 6 Lungs - scattered rhonchi Abd - obese, soft Ext - chronic edema HEENT - ETT GU foley    Lab Results:  Basename 11/08/11 0430 11/06/11 1536 11/05/11 2101  WBC 13.7* -- 14.9*  HGB 11.4* 13.9 --  PLT 248 -- 299    Basename 11/08/11 0430 11/08/11 0105  NA 143 143  K 3.7 3.6  CL 107 107  CO2 26 26  GLUCOSE 152* 150*  BUN 15 15  CREATININE 0.64 0.60    Basename 11/06/11 1955 11/06/11 1345  TROPONINI 0.46* 0.60*   Hepatic Function Panel  Basename 11/06/11 1345  PROT 6.7  ALBUMIN 2.9*  AST 69*  ALT 89*  ALKPHOS 156*  BILITOT 1.2  BILIDIR --  IBILI --    Imaging: Dg Chest Port 1 View  11/08/2011  *RADIOLOGY REPORT*  Clinical Data: Endotracheal tube  PORTABLE CHEST - 1 VIEW  Comparison: 11/07/2011; 11/06/2011; 11/05/2011  Findings: Examination is again degraded secondary to portable technique and patient motion artifact and body habitus.  Grossly unchanged cardiac silhouette and mediastinal contours grossly unchanged given slightly decreased lung volumes.   Interval retraction of endotracheal tube with tip overlying tracheal air column, approximate 3 cm superior to the carina.  Otherwise stable positioning of support apparatus.  Grossly  unchanged layering small bilateral effusions and bibasilar heterogeneous opacities. No definite pneumothorax, though evaluation is slightly limited secondary to overlying chin.  Grossly unchanged bones.  IMPRESSION: 1. Interval retraction of endotracheal tube with tip now approximately 3 cm superior to the carina.  Otherwise, stable position of support apparatus.  No definite pneumothorax.  2.  Grossly unchanged findings of pulmonary edema, layering bilateral effusions and bibasilar opacities, atelectasis versus infiltrate.  Original Report Authenticated By: Waynard Reeds, M.D.   Dg Chest Port 1 View  11/07/2011  *RADIOLOGY REPORT*  Clinical Data: Evaluate endotracheal tube placement.  Shortness of breath.  PORTABLE CHEST - 1 VIEW  Comparison: Multiple priors, most recently 11/06/2011.  Findings: Previously noted left internal jugular central venous catheters been removed.  Endotracheal tube tip has been advanced, now only 6 mm above the carina.  There continues to be diffuse interstitial and airspace opacities throughout all aspects the lungs bilaterally.  Mild-moderate cardiomegaly is unchanged. The patient is rotated to the left on today's exam, resulting in distortion of the mediastinal contours and reduced diagnostic sensitivity and specificity for mediastinal pathology.  .  IMPRESSION: 1.  Support apparatus, as above.  Please take note of low-lying endotracheal tube (6 mm above the carina), and consider withdrawal approximate 3-4 cm for more optimal placement. 2.  Otherwise, the radiographic appearance of chest is essentially unchanged, likely indicative of severe pulmonary edema (possibly with small bilateral pleural effusions).  This  was made a call report.  Original Report Authenticated By: Florencia Reasons, M.D.   Personally viewed.   Telemetry: Now sinus tachycardia 116, gradual increase in HR as rewarming took place.  Personally viewed.   Cardiac Studies:  EF 25%  Assessment/Plan:   PEA  arrest - no arrhytmias, no VT, no pauses. Improved Neurologic exam is hopeful.  Acute systolic HF - CVP 6, out net 2.6 L yest -5.8 for total admit. Continue lasix 80 IV Q 8. Appears more euvolemic - will start low dose Toprol 25mg . BP able to tolerate. Will also add low dose lisinopril 5mg  QD for after load reduction. Creat. Is normal.   Resp failure - CXR may be slightly better but still shows B edema/infiltrates. On ABX and aggressive diuresis.   SKAINS, MARK 11/08/2011, 8:29 AM

## 2011-11-08 NOTE — Progress Notes (Signed)
Name: Whitney [name] MRN: [id number] DOB: 01/20/1956    LOS: 3  PCCM ADMISSION NOTE  History of Present Illness: 56 yo AAF with recent history of dyspnea, chest pain and left leg pain sent to [facility] ED to r/o PE.  Suffered PEA cardiac arrest in CT scanner, was intubated and successfully resuscitated.  Subsequently developed hypertensive emergency associated with pulmonary edema and severe hypoxia.  Improved with Lasix / Nitroglycerine.  Brought to 2900 for hypothermia protocol.  Lines / Drains: 2/4  OETT 2/4  OGT 2/4  Foley 2/5  R IJ TLC  Cultures: 2/5  Blood 2/5  Sputum 2/5 Ur str/leg ag >> neg  Antibiotics: 2/5  Ceftriaxone >> 2/7 2/5  Levofloxacin >>2/7  Tests / Events: 2/4  Chest CTA>>>Bilateral airspace disease, bilateral effusions, no PE 2/4 2D TTE >> LVEF 30-35%, LVH, RV mildly dilated, RAE   OVERNIGHT: following commands, tachypnic, shallow resp on PSV 5/5 today  Vital Signs: Temp:  [91.2 F (32.9 C)-100 F (37.8 C)] 99.9 F (37.7 C) (02/07 0700) Pulse Rate:  [45-114] 114  (02/07 0700) Resp:  [0-35] 29  (02/07 0700) BP: (148-182)/(69-93) 148/73 mmHg (02/07 0421) SpO2:  [95 %-100 %] 100 % (02/07 0700) Arterial Line BP: (116-180)/(62-94) 127/65 mmHg (02/07 0700) FiO2 (%):  [39.8 %-40.3 %] 40 % (02/07 0700) I/O last 3 completed shifts: In: 2762.3 [I.V.:2412.3; IV Piggyback:350] Out: 4800 [Urine:4800]  Physical Examination: General:  Intubated, following commands Neuro:  Awake, following commands, interactive, synchronous, nonfocal HEENT:  PERRL, pink conjunctivae, moist membranes Neck:  Cannot assess JVD Cardiovascular:  RRR, no M/R/G Lungs:  insp crackles in bases, scattered rhonchi Abdomen:  Obese, bowel sounds present Musculoskeletal:  Chronic stasis  / edema  Skin:  No rash  Ventilator settings: Vent Mode:  [-] PSV;CPAP FiO2 (%):  [39.8 %-40.3 %] 40 % Set Rate:  [35 bmp] 35 bmp Vt Set:  [320 mL] 320 mL PEEP:  [4.4 cmH20-5 cmH20] 5 cmH20 Pressure  Support:  [5 cmH20] 5 cmH20 Plateau Pressure:  [20 cmH20-21 cmH20] 21 cmH20  Labs and Imaging:    CBC    Component Value Date/Time   WBC 13.7* 11/08/2011 0430   RBC 4.31 11/08/2011 0430   HGB 11.4* 11/08/2011 0430   HCT 35.0* 11/08/2011 0430   PLT 248 11/08/2011 0430   MCV 81.2 11/08/2011 0430   MCH 26.5 11/08/2011 0430   MCHC 32.6 11/08/2011 0430   RDW 15.1 11/08/2011 0430   LYMPHSABS 2.2 11/05/2011 2101   MONOABS 0.8 11/05/2011 2101   EOSABS 0.0 11/05/2011 2101   BASOSABS 0.0 11/05/2011 2101    BMET    Component Value Date/Time   NA 143 11/08/2011 0430   K 3.7 11/08/2011 0430   CL 107 11/08/2011 0430   CO2 26 11/08/2011 0430   GLUCOSE 152* 11/08/2011 0430   BUN 15 11/08/2011 0430   CREATININE 0.64 11/08/2011 0430   CALCIUM 9.1 11/08/2011 0430   GFRNONAA >90 11/08/2011 0430   GFRAA >90 11/08/2011 0430      ASSESSMENT AND PLAN  56 y/o female s/p respiratory arrest after coming to the [facility] ED for evaluation of dyspnea, chest pain and leg pain.  Had PEA arrest requiring CPR for 10 minutes, hypertensive afterwards.    NEUROLOGIC A:  This morning's exam is very encouraging. P: -->  Versed / Fentanyl for RASS -1 -->  wua daily  PULMONARY  Lab 11/08/11 0432 11/07/11 0349 11/06/11 2356 11/06/11 1441 11/06/11 0347  PHART 7.378 7.436* 7.449* 7.485*  7.320*  PCO2ART 43.8 30.3* 32.7* 26.5* 42.1  PO2ART 82.0 67.0* 60.0* 426.0* 64.0*  HCO3 25.7* 21.4 23.5 20.5 21.5  O2SAT 95.0 97.0 95.0 100.0 89.0   A:  Hypercarbic hypoxemic acute respiratory failure, most likely due to acute pulmonary edema.  Suspect baseline OSA / OHS. Improved oxygenation daily, but failed SBT 2/7 AM, likely due to pulm edema.  P: -->  D/C ARDS protocol, start mech vent -->  Dec sedation and set RR on vent, repeat ABG later today -->  Daily ABG --> continue aggressive diuresis   CARDIOVASCULAR  Lab 11/06/11 1955 11/06/11 1345 11/06/11 0935 11/05/11 2102  TROPONINI 0.46* 0.60* 0.85* <0.30  LATICACIDVEN -- -- -- --   A:  Status post PEA  arrest due to hypertensive emergency with flash pulmonary edema.  Initially hypertensive post arrest.  Pressure has been labile on hypothermia protocol but renal function stable and oxygenation improving with diuresis. Still massively volume up by my exam.  P: -->  Consider nitroglycerine if BP remains elevated today -->  suspect hypotension is related to CHF and hypothermia, rewarming starts this morning -->  Monitor BP , goal SBP <150 --> appreciate cardiology consult --> diurese again today --> lisinopril started --> metop started today  RENAL  Lab 11/08/11 0430 11/08/11 0105 11/07/11 1945 11/07/11 1545 11/07/11 1130 11/07/11 0745 11/07/11 0355  NA 143 143 143 143 144 -- --  K 3.7 3.6 -- -- -- -- --  CL 107 107 107 107 111 -- --  CO2 26 26 24 24 22  -- --  GLUCOSE 152* 150* 168* 174* 176* -- --  BUN 15 15 14 15 17  -- --  CREATININE 0.64 0.60 0.57 0.55 0.50 -- --  CALCIUM 9.1 8.9 9.2 9.3 9.0 -- --  MG 1.7 -- -- -- -- 1.6 1.6  PHOS -- -- -- -- -- -- 3.3   A:  Normal renal function.  Hypokalemia. P: -->  Start daily K replacement -->  check Mg -->  Repeat BMP at 1400 today  GASTROINTESTINAL  Lab 11/06/11 1345 11/05/11 2101  AST 69* 45*  ALT 89* 59*  ALKPHOS 156* 150*  BILITOT 1.2 0.8  PROT 6.7 7.3  ALBUMIN 2.9* 3.5   A:  Mildly elevated liver enzymes, possible shocked liver.  Improving. P: -->  monitor  HEMATOLOGIC  Lab 11/08/11 0430 11/06/11 1536 11/06/11 1230 11/06/11 0244 11/05/11 2330 11/05/11 2303 11/05/11 2101  HGB 11.4* 13.9 -- -- 15.6* 15.3* 11.2*  HCT 35.0* 41.0 -- -- 46.0 45.0 34.7*  PLT 248 -- -- -- -- -- 299  INR -- -- 1.17 1.11 -- -- 1.01  APTT -- -- 28 29 -- -- 34   A:  No active issues P: -->  Monitor cbc  INFECTIOUS  Lab 11/08/11 0430 11/06/11 0329 11/05/11 2101  WBC 13.7* -- 14.9*  PROCALCITON -- 3.76 --   A:  Mildly elevated WBC.  No clear source of infection.  Pneumonia is a possibility though cardiogenic edema is more likely. P: -->   D/c antibiotics today -->  F/u Sputum / blood cultures -->  Repeat CBC  ENDOCRINE  Lab 11/08/11 0755 11/08/11 0445 11/08/11 0008 11/07/11 1948 11/07/11 1542  GLUCAP 125* 91 94 98 126*   A:  Diabetes / Hyperglycemia P: -->  Insulin gtt  BEST PRACTICE / DISPOSITION -->  ICU status under PCCM -->  Full code -->  Start tube feeding after rewarming -->  Heparin [place] for DVT Px -->  Protonix  IV for GI Px -->  Ventilator bundle -->  Hope to meet with family today  The patient is critically ill with multiple organ systems failure and requires high complexity decision making for assessment and support, frequent evaluation and titration of therapies, application of advanced monitoring technologies and extensive interpretation of multiple databases. Critical Care Time devoted to patient care services described in this note is 30 minutes.  Max Fickle Pulmonary and Critical Care Medicine [facility] Pager: [phone]  11/08/2011, 9:09 AM

## 2011-11-08 NOTE — Progress Notes (Signed)
Clinical Social Worker completed psychosocial assessment, please see shadow chart for details.  CSW provided emotional support and validated feelings.  Dispo not clear at this time.  CSW to continue to follow and assist as needed.  Angelia Mould, MSW, [place] [phone]

## 2011-11-09 ENCOUNTER — Inpatient Hospital Stay (HOSPITAL_COMMUNITY): Payer: Medicare Other

## 2011-11-09 DIAGNOSIS — Z09 Encounter for follow-up examination after completed treatment for conditions other than malignant neoplasm: Secondary | ICD-10-CM | POA: Diagnosis not present

## 2011-11-09 DIAGNOSIS — I10 Essential (primary) hypertension: Secondary | ICD-10-CM | POA: Diagnosis not present

## 2011-11-09 DIAGNOSIS — J810 Acute pulmonary edema: Secondary | ICD-10-CM | POA: Diagnosis not present

## 2011-11-09 DIAGNOSIS — I469 Cardiac arrest, cause unspecified: Secondary | ICD-10-CM | POA: Diagnosis not present

## 2011-11-09 DIAGNOSIS — J9600 Acute respiratory failure, unspecified whether with hypoxia or hypercapnia: Secondary | ICD-10-CM | POA: Diagnosis not present

## 2011-11-09 DIAGNOSIS — I5021 Acute systolic (congestive) heart failure: Secondary | ICD-10-CM | POA: Diagnosis not present

## 2011-11-09 LAB — BASIC METABOLIC PANEL
BUN: 25 mg/dL — ABNORMAL HIGH (ref 6–23)
Chloride: 107 mEq/L (ref 96–112)
GFR calc Af Amer: >90 mL/min (ref >90)
GFR calc non Af Amer: >90 mL/min (ref >90)
Potassium: 3.5 mEq/L (ref 3.5–5.1)
Sodium: 146 mEq/L — ABNORMAL HIGH (ref 135–145)

## 2011-11-09 LAB — BLOOD GAS, ARTERIAL
Acid-Base Excess: 4.3 mmol/L — ABNORMAL HIGH (ref 0.0–2.0)
Bicarbonate: 28.5 mEq/L — ABNORMAL HIGH (ref 20.0–24.0)
FIO2: 0.3 %
MECHVT: 400 mL
O2 Saturation: 97.1 %
Patient temperature: 98.6
TCO2: 29.9 mmol/L (ref 0–100)
pH, Arterial: 7.423 — ABNORMAL HIGH (ref 7.350–7.400)

## 2011-11-09 LAB — CBC
HCT: 37.3 % (ref 36.0–46.0)
MCHC: 31.4 g/dL (ref 30.0–36.0)
Platelets: 262 10*3/uL (ref 150–400)
RDW: 15.4 % (ref 11.5–15.5)
WBC: 7.1 10*3/uL (ref 4.0–10.5)

## 2011-11-09 LAB — GLUCOSE, CAPILLARY: Glucose-Capillary: 170 mg/dL — ABNORMAL HIGH (ref 70–99)

## 2011-11-09 MED ORDER — HYDRALAZINE HCL 20 MG/ML IJ SOLN
10.0000 mg | Freq: Four times a day (QID) | INTRAMUSCULAR | Status: DC
Start: 2011-11-09 — End: 2011-11-11
  Administered 2011-11-09 – 2011-11-11 (×8): 10 mg via INTRAVENOUS
  Filled 2011-11-09: qty 0.5
  Filled 2011-11-09: qty 1
  Filled 2011-11-09: qty 0.5
  Filled 2011-11-09: qty 1
  Filled 2011-11-09 (×2): qty 0.5
  Filled 2011-11-09: qty 1
  Filled 2011-11-09 (×4): qty 0.5
  Filled 2011-11-09: qty 1

## 2011-11-09 MED ORDER — ALUM & MAG HYDROXIDE-SIMETH 200-200-20 MG/5ML PO SUSP
15.0000 mL | Freq: Four times a day (QID) | ORAL | Status: DC | PRN
  Administered 2011-11-09 – 2011-11-10 (×2): 15 mL
  Filled 2011-11-09 (×2): qty 30

## 2011-11-09 MED ORDER — FUROSEMIDE 10 MG/ML IJ SOLN
80.0000 mg | Freq: Three times a day (TID) | INTRAMUSCULAR | Status: AC
  Administered 2011-11-09 – 2011-11-10 (×3): 80 mg via INTRAVENOUS
  Filled 2011-11-09 (×3): qty 8

## 2011-11-09 MED ORDER — POTASSIUM CHLORIDE 10 MEQ/100ML IV SOLN
10.0000 meq | INTRAVENOUS | Status: AC
  Administered 2011-11-09 (×4): 10 meq via INTRAVENOUS
  Filled 2011-11-09 (×2): qty 200

## 2011-11-09 MED ORDER — NITROGLYCERIN IN D5W 200-5 MCG/ML-% IV SOLN
2.0000 ug/min | INTRAVENOUS | Status: DC
  Administered 2011-11-09: 20 ug/min via INTRAVENOUS
  Filled 2011-11-09: qty 250

## 2011-11-09 MED ORDER — ONDANSETRON HCL 4 MG/2ML IJ SOLN
4.0000 mg | Freq: Three times a day (TID) | INTRAMUSCULAR | Status: DC | PRN
  Administered 2011-11-10: 4 mg via INTRAVENOUS
  Filled 2011-11-09: qty 2

## 2011-11-09 MED ORDER — METOPROLOL TARTRATE 1 MG/ML IV SOLN
5.0000 mg | Freq: Four times a day (QID) | INTRAVENOUS | Status: DC
  Administered 2011-11-09 – 2011-11-11 (×9): 5 mg via INTRAVENOUS
  Filled 2011-11-09 (×13): qty 5

## 2011-11-09 NOTE — Progress Notes (Signed)
Clinical Child psychotherapist met with pt, pt's brother, and pt's sister in law at bedside.  CSW provided emotional support.  CSW provided opportunity for pt/family to review questions and/or concerns. CSW to continue to follow and assist as needed.  Angelia Mould, MSW, [place] [phone]

## 2011-11-09 NOTE — Progress Notes (Signed)
Speech Language/Pathology Clinical/Bedside Swallow Evaluation Patient Details  Name: Whitney [name] MRN: [id number] DOB: April 20, 1956 Today's Date: 11/09/2011  Past Medical History:  Past Medical History  Diagnosis Date  . Diabetes mellitus   . Hypertension    Past Surgical History: History reviewed. No pertinent past surgical history. HPI:  Pt is a 56 year old female admitted with recent history of dyspnea, chest pain and left leg pain sent to [facility] ED to r/o PE.  Suffered PEA cardiac arrest in CT scanner, was intubated and successfully resuscitated.  Subsequently developed hypertensive emergency associated with pulmonary edema and severe hypoxia. Pt intubated from 2/4 to 2/8 at 11 am. Pt reports history of GERD   Assessment/Recommendations/Treatment Plan    SLP Assessment Clinical Impression Statement: Pt presents with dysphonia, wek cough response, poor posture and immediate cough with thin liquids all indicative of reduced airway protection 4 hours post extubation, in setting of poor respiratory health. Pt recommended to remain NPO except for ice chips and POs in applesauce for tonight with f/u in am for improved airway protection. Pt felt to exhibit acute reversible dysphagia with probable improvement over next 24-48 hours.  Risk for Aspiration: Moderate Other Related Risk Factors: History of GERD;Decreased respiratory status  Swallow Evaluation Recommendations General Recommendation: NPO except meds;Ice chips PRN after oral care (give meds in applesauce/pudding) Medication Administration: Whole meds with puree Supervision: Patient able to self feed;Full supervision/cueing for compensatory strategies Postural Changes and/or Swallow Maneuvers: Seated upright 90 degrees (Tilt bed with reverse trendelenberg to achieve upright postu) Oral Care Recommendations: Oral care QID Follow up Recommendations: None  Treatment Plan Treatment Plan Recommendations: Therapy as outlined in treatment plan  below Speech Therapy Frequency: min 2x/week Treatment Duration: 2 weeks Interventions: Aspiration precaution training;Trials of upgraded texture/liquids;Diet toleration management by SLP;Patient/family education  Prognosis Prognosis for Safe Diet Advancement: Good     Swallowing Goals  SLP Swallowing Goals Goal #3: Pt will consume thin liquid/ regular consistency trials with no overt s/s of aspiration or dysphagia Swallow Study Goal #3 - Progress: Progressing toward goal  [facility], MA CCC-SLP [phone]  Claudine Mouton 11/09/2011,4:02 PM

## 2011-11-09 NOTE — Progress Notes (Signed)
Speech Language/Pathology Received order for swallow eval, pt is not yet extubated. SLP will f/u in pm or next day when pt is ready. Harlon Ditty, MA CCC-SLP [phone].

## 2011-11-09 NOTE — Procedures (Signed)
Extubation Procedure Note  Patient Details:   Name: Whitney [name] DOB: 02-05-1956 MRN: [id number]   Airway Documentation:     Evaluation  O2 sats: stable throughout Complications: No apparent complications Patient did tolerate procedure well. Bilateral Breath Sounds: Coarse crackles Suctioning: Airway Yes  Dorethea Clan 11/09/2011, 11:00 AM

## 2011-11-09 NOTE — Progress Notes (Signed)
Subjective:  56 year old with acute resp failure, acute systolic heart failure newly discovered, PEA arrest in CT scanner status post hypothermia protocol. Doing better. No complaints. Still with ET tube this morning.  Objective:  Vital Signs in the last 24 hours: Temp:  [98.8 F (37.1 C)-100.3 F (37.9 C)] 98.8 F (37.1 C) (02/08 1126) Pulse Rate:  [89-117] 93  (02/08 1200) Resp:  [3-38] 19  (02/08 1200) BP: (147)/(82) 147/82 mmHg (02/08 0721) SpO2:  [93 %-97 %] 96 % (02/08 1200) Arterial Line BP: (90-157)/(63-83) 133/73 mmHg (02/08 1200) FiO2 (%):  [29.6 %-30.2 %] 30.1 % (02/08 1000) Weight:  [143.9 kg (317 lb 3.9 oz)] 143.9 kg (317 lb 3.9 oz) (02/08 0430)  Intake/Output from previous day: 02/07 0701 - 02/08 0700 In: 1118 [I.V.:460; NG/GT:640; IV Piggyback:18] Out: 5475 [Urine:5475]   Physical Exam: Obese on ventilator Scattered rhonchi bilaterally Tachycardia regular rhythm Chronic edema 3+ bilateral lower extremity     Lab Results:  Basename 11/09/11 0450 11/08/11 0430  WBC 7.1 13.7*  HGB 11.7* 11.4*  PLT 262 248    Basename 11/09/11 0450 11/08/11 0430  NA 146* 143  K 3.5 3.7  CL 107 107  CO2 28 26  GLUCOSE 199* 152*  BUN 25* 15  CREATININE 0.73 0.64    Basename 11/06/11 1955 11/06/11 1345  TROPONINI 0.46* 0.60*   Hepatic Function Panel  Basename 11/06/11 1345  PROT 6.7  ALBUMIN 2.9*  AST 69*  ALT 89*  ALKPHOS 156*  BILITOT 1.2  BILIDIR --  IBILI --   No results found for this basename: CHOL in the last 72 hours No results found for this basename: PROTIME in the last 72 hours  Imaging: Dg Chest Port 1 View  11/09/2011  *RADIOLOGY REPORT*  Clinical Data: Assess endotracheal tube.  PORTABLE CHEST - 1 VIEW  Comparison: 11/08/2011.  Findings: Lower lung volumes.  Endotracheal tube tip 26 mm from the carina.  Right IJ central line is present with the tip at the cavoatrial junction.  Pulmonary aeration grossly appears unchanged, with bilateral basilar  densities, likely atelectasis and airspace disease in combination.  The cardiopericardial silhouette unchanged.  The enteric tube has been retracted and is now within the upper thoracic esophagus.  IMPRESSION:  1.  Retraction of enteric tube with the tip in the upper to mid thoracic esophagus.  This should be advanced at least 30 cm. 2.  Lower lung volumes with otherwise unchanged pulmonary aeration. 3.  Other support apparatus stable.  This is a call report.  Original Report Authenticated By: Andreas Newport, M.D.   Dg Chest Port 1 View  11/08/2011  *RADIOLOGY REPORT*  Clinical Data: Endotracheal tube  PORTABLE CHEST - 1 VIEW  Comparison: 11/07/2011; 11/06/2011; 11/05/2011  Findings: Examination is again degraded secondary to portable technique and patient motion artifact and body habitus.  Grossly unchanged cardiac silhouette and mediastinal contours grossly unchanged given slightly decreased lung volumes.   Interval retraction of endotracheal tube with tip overlying tracheal air column, approximate 3 cm superior to the carina.  Otherwise stable positioning of support apparatus.  Grossly unchanged layering small bilateral effusions and bibasilar heterogeneous opacities. No definite pneumothorax, though evaluation is slightly limited secondary to overlying chin.  Grossly unchanged bones.  IMPRESSION: 1. Interval retraction of endotracheal tube with tip now approximately 3 cm superior to the carina.  Otherwise, stable position of support apparatus.  No definite pneumothorax.  2.  Grossly unchanged findings of pulmonary edema, layering bilateral effusions and bibasilar  opacities, atelectasis versus infiltrate.  Original Report Authenticated By: Waynard Reeds, M.D.   Personally viewed.   Telemetry: Sinus tach Personally viewed.   Assessment/Plan:  56 year old status post cardiac arrest/PEA arrest, acute systolic heart failure, respiratory failure, morbid obesity - Chest x-ray this morning appears to have  mildly decreased edema bilaterally. - I spoke with critical care medicine. I'm fine with her going on hydralazine as well as nitroglycerin drip. This will be until she is able to take her lisinopril orally. She has pulled out her NG tube. Continue with metoprolol IV.  - Aggressive diuresis. Good net negative output. Continue for now with Lasix 80 IV 3 times a day. - hopeful extubation.   SKAINS, MARK 11/09/2011, 1:43 PM

## 2011-11-09 NOTE — Progress Notes (Signed)
Name: Whitney [name] MRN: [id number] DOB: September 24, 1956    LOS: 4  PCCM PROGRESS NOTE  History of Present Illness: 56 yo AAF with recent history of dyspnea, chest pain and left leg pain sent to [facility] ED to r/o PE.  Suffered PEA cardiac arrest in CT scanner, was intubated and successfully resuscitated.  Subsequently developed hypertensive emergency associated with pulmonary edema and severe hypoxia.  Improved with Lasix / Nitroglycerine.  Brought to 2900 for hypothermia protocol.  Lines / Drains: 2/4  OETT 2/4  OGT 2/4  Foley 2/5  R IJ TLC  Cultures: 2/5  Blood 2/5  Sputum 2/5 Ur str/leg ag >> neg  Antibiotics: 2/5  Ceftriaxone >> 2/7 2/5  Levofloxacin >>2/7  Tests / Events: 2/4  Chest CTA>>>Bilateral airspace disease, bilateral effusions, no PE 2/4 2D TTE >> LVEF 30-35%, LVH, RV mildly dilated, RAE   OVERNIGHT: comfortable on vent, following commands  Vital Signs: Temp:  [98.8 F (37.1 C)-100.3 F (37.9 C)] 98.8 F (37.1 C) (02/08 0808) Pulse Rate:  [105-117] 117  (02/08 0800) Resp:  [3-38] 27  (02/08 0800) BP: (118-147)/(71-82) 147/82 mmHg (02/08 0721) SpO2:  [93 %-99 %] 97 % (02/08 0800) Arterial Line BP: (97-157)/(63-85) 146/81 mmHg (02/08 0800) FiO2 (%):  [29.6 %-39.8 %] 29.6 % (02/08 0800) Weight:  [143.9 kg (317 lb 3.9 oz)] 143.9 kg (317 lb 3.9 oz) (02/08 0430) I/O last 3 completed shifts: In: 1718 [I.V.:860; NG/GT:640; IV Piggyback:218] Out: 6480 [Urine:6480]  Physical Examination: General:  Intubated, following commands Neuro:  Awake, following commands, interactive, synchronous, nonfocal HEENT:  PERRL, pink conjunctivae, moist membranes Neck:  Cannot assess JVD Cardiovascular:  RRR, no M/R/G Lungs:  insp crackles in bases, scattered rhonchi Abdomen:  Obese, bowel sounds present Musculoskeletal:  Chronic stasis  / edema  Skin:  No rash  Ventilator settings: Vent Mode:  [-] CPAP FiO2 (%):  [29.6 %-39.8 %] 29.6 % Set Rate:  [18 bmp] 18 bmp Vt Set:  [400  mL] 400 mL PEEP:  [4.7 cmH20-5 cmH20] 4.7 cmH20 Pressure Support:  [5 cmH20] 5 cmH20 Plateau Pressure:  [20 cmH20-26 cmH20] 20 cmH20  Labs and Imaging:    CBC    Component Value Date/Time   WBC 7.1 11/09/2011 0450   RBC 4.54 11/09/2011 0450   HGB 11.7* 11/09/2011 0450   HCT 37.3 11/09/2011 0450   PLT 262 11/09/2011 0450   MCV 82.2 11/09/2011 0450   MCH 25.8* 11/09/2011 0450   MCHC 31.4 11/09/2011 0450   RDW 15.4 11/09/2011 0450   LYMPHSABS 2.2 11/05/2011 2101   MONOABS 0.8 11/05/2011 2101   EOSABS 0.0 11/05/2011 2101   BASOSABS 0.0 11/05/2011 2101    BMET    Component Value Date/Time   NA 146* 11/09/2011 0450   K 3.5 11/09/2011 0450   CL 107 11/09/2011 0450   CO2 28 11/09/2011 0450   GLUCOSE 199* 11/09/2011 0450   BUN 25* 11/09/2011 0450   CREATININE 0.73 11/09/2011 0450   CALCIUM 9.6 11/09/2011 0450   GFRNONAA >90 11/09/2011 0450   GFRAA >90 11/09/2011 0450      ASSESSMENT AND PLAN  56 y/o female s/p respiratory arrest after coming to the [facility] ED for evaluation of dyspnea, chest pain and leg pain.  Had PEA arrest requiring CPR for 10 minutes, hypertensive afterwards.    NEUROLOGIC A:  Has been following commands well since rewarming. P: -->  Extubate today -->  wua daily  PULMONARY  Lab 11/09/11 0310 11/08/11 0432 11/08/11 0206  11/07/11 0349 11/06/11 2356  PHART 7.423* 7.378 7.435* 7.436* 7.449*  PCO2ART 44.5 43.8 40.4 30.3* 32.7*  PO2ART 91.7 82.0 86.1 67.0* 60.0*  HCO3 28.5* 25.7* 26.6* 21.4 23.5  O2SAT 97.1 95.0 97.4 97.0 95.0   A:  Hypercarbic hypoxemic acute respiratory failure, most likely due to acute pulmonary edema.  Suspect baseline OSA / OHS. Improved oxygenation daily, but failed SBT 2/7 AM, likely due to pulm edema.  P: -->  Extubate today --> continue aggressive diuresis   CARDIOVASCULAR  Lab 11/06/11 1955 11/06/11 1345 11/06/11 0935 11/05/11 2102  TROPONINI 0.46* 0.60* 0.85* <0.30  LATICACIDVEN -- -- -- --   A:  Status post PEA arrest due to hypertensive emergency with flash  pulmonary edema.  Initially hypertensive post arrest.  Pressure has been labile on hypothermia protocol but renal function stable and oxygenation improving with diuresis. Still massively volume up by my exam.  P:  -->  Monitor BP , goal SBP <150 --> appreciate cardiology consult --> diurese again today --> 2/8: because will likely not be able to take po post extubation, will start IV metop and IV hydralazine and IV nitroglyerin to prevent hypertension/flash --> once able to take po restart metop 25 bid and lisinopril 10 daily  RENAL  Lab 11/09/11 0450 11/08/11 0430 11/08/11 0105 11/07/11 1945 11/07/11 1545 11/07/11 0745 11/07/11 0355  NA 146* 143 143 143 143 -- --  K 3.5 3.7 -- -- -- -- --  CL 107 107 107 107 107 -- --  CO2 28 26 26 24 24  -- --  GLUCOSE 199* 152* 150* 168* 174* -- --  BUN 25* 15 15 14 15  -- --  CREATININE 0.73 0.64 0.60 0.57 0.55 -- --  CALCIUM 9.6 9.1 8.9 9.2 9.3 -- --  MG -- 1.7 -- -- -- 1.6 1.6  PHOS -- -- -- -- -- -- 3.3   A:  Normal renal function.  Hypokalemia. P: -->  daily K replacement (change to IV for now)    GASTROINTESTINAL  Lab 11/06/11 1345 11/05/11 2101  AST 69* 45*  ALT 89* 59*  ALKPHOS 156* 150*  BILITOT 1.2 0.8  PROT 6.7 7.3  ALBUMIN 2.9* 3.5   A:  Mildly elevated liver enzymes, possible shocked liver.  Improving. P: -->  monitor  HEMATOLOGIC  Lab 11/09/11 0450 11/08/11 0430 11/06/11 1536 11/06/11 1230 11/06/11 0244 11/05/11 2330 11/05/11 2303 11/05/11 2101  HGB 11.7* 11.4* 13.9 -- -- 15.6* 15.3* --  HCT 37.3 35.0* 41.0 -- -- 46.0 45.0 --  PLT 262 248 -- -- -- -- -- 299  INR -- -- -- 1.17 1.11 -- -- 1.01  APTT -- -- -- 28 29 -- -- 34   A:  No active issues P: -->  Monitor cbc  INFECTIOUS  Lab 11/09/11 0450 11/08/11 0430 11/06/11 0329 11/05/11 2101  WBC 7.1 13.7* -- 14.9*  PROCALCITON -- -- 3.76 --   A:  Mildly elevated WBC.  No clear source of infection.  Pneumonia is a possibility though cardiogenic edema is more  likely. P:  -->  F/u Sputum / blood cultures -->  Repeat CBC  ENDOCRINE  Lab 11/09/11 0357 11/08/11 2340 11/08/11 1938 11/08/11 1703 11/08/11 1241  GLUCAP 170* 186* 146* 166* 146*   A:  Diabetes / Hyperglycemia P: -->  Insulin gtt  BEST PRACTICE / DISPOSITION -->  ICU status under PCCM -->  Full code -->  Speech eval, npo for now -->  Heparin [place] for DVT Px -->  Protonix IV for GI Px -->  Ventilator bundle -->  Hope to meet with family today  The patient is critically ill with multiple organ systems failure and requires high complexity decision making for assessment and support, frequent evaluation and titration of therapies, application of advanced monitoring technologies and extensive interpretation of multiple databases. Critical Care Time devoted to patient care services described in this note is 30 minutes.  Max Fickle Pulmonary and Critical Care Medicine [facility] Pager: [phone]  11/09/2011, 8:43 AM

## 2011-11-09 NOTE — Progress Notes (Signed)
eLink Physician-Brief Progress Note Patient Name: Whitney [name] DOB: [birth year] MRN: [id number]  Date of Service  11/09/2011   HPI/Events of Note   Nausea  eICU Interventions  PRN zofran ordered   Intervention Category Minor Interventions: Routine modifications to care plan (e.g. PRN medications for pain, fever)  Lorian Yaun 11/09/2011, 10:35 PM

## 2011-11-09 NOTE — Progress Notes (Signed)
UR Completed.  [name] [name] [phone] 11/09/2011  

## 2011-11-10 ENCOUNTER — Inpatient Hospital Stay (HOSPITAL_COMMUNITY): Payer: Medicare Other

## 2011-11-10 DIAGNOSIS — I469 Cardiac arrest, cause unspecified: Secondary | ICD-10-CM | POA: Diagnosis not present

## 2011-11-10 DIAGNOSIS — R109 Unspecified abdominal pain: Secondary | ICD-10-CM

## 2011-11-10 DIAGNOSIS — I5021 Acute systolic (congestive) heart failure: Secondary | ICD-10-CM | POA: Diagnosis not present

## 2011-11-10 DIAGNOSIS — Z09 Encounter for follow-up examination after completed treatment for conditions other than malignant neoplasm: Secondary | ICD-10-CM | POA: Diagnosis not present

## 2011-11-10 DIAGNOSIS — J810 Acute pulmonary edema: Secondary | ICD-10-CM | POA: Diagnosis not present

## 2011-11-10 DIAGNOSIS — R918 Other nonspecific abnormal finding of lung field: Secondary | ICD-10-CM | POA: Diagnosis not present

## 2011-11-10 DIAGNOSIS — J9600 Acute respiratory failure, unspecified whether with hypoxia or hypercapnia: Secondary | ICD-10-CM | POA: Diagnosis not present

## 2011-11-10 LAB — BASIC METABOLIC PANEL
BUN: 22 mg/dL (ref 6–23)
Creatinine, Ser: 0.57 mg/dL (ref 0.50–1.10)
GFR calc Af Amer: >90 mL/min (ref >90)
GFR calc non Af Amer: >90 mL/min (ref >90)
Glucose, Bld: 162 mg/dL — ABNORMAL HIGH (ref 70–99)
Potassium: 3.1 mEq/L — ABNORMAL LOW (ref 3.5–5.1)

## 2011-11-10 LAB — COMPREHENSIVE METABOLIC PANEL
ALT: 32 U/L (ref 0–35)
Alkaline Phosphatase: 151 U/L — ABNORMAL HIGH (ref 39–117)
BUN: 24 mg/dL — ABNORMAL HIGH (ref 6–23)
CO2: 31 mEq/L (ref 19–32)
GFR calc Af Amer: >90 mL/min (ref >90)
GFR calc non Af Amer: >90 mL/min (ref >90)
Glucose, Bld: 162 mg/dL — ABNORMAL HIGH (ref 70–99)
Potassium: 3.6 mEq/L (ref 3.5–5.1)
Sodium: 143 mEq/L (ref 135–145)
Total Bilirubin: 1.8 mg/dL — ABNORMAL HIGH (ref 0.3–1.2)
Total Protein: 8.3 g/dL (ref 6.0–8.3)

## 2011-11-10 LAB — GLUCOSE, CAPILLARY
Glucose-Capillary: 189 mg/dL — ABNORMAL HIGH (ref 70–99)
Glucose-Capillary: 160 mg/dL — ABNORMAL HIGH (ref 70–99)

## 2011-11-10 LAB — CBC
HCT: 39.4 % (ref 36.0–46.0)
Hemoglobin: 12.3 g/dL (ref 12.0–15.0)
MCH: 25.9 pg — ABNORMAL LOW (ref 26.0–34.0)
MCHC: 31.2 g/dL (ref 30.0–36.0)
MCV: 82.9 fL (ref 78.0–100.0)
RDW: 15.3 % (ref 11.5–15.5)

## 2011-11-10 LAB — CLOSTRIDIUM DIFFICILE BY PCR: Toxigenic C. Difficile by PCR: NEGATIVE

## 2011-11-10 LAB — LACTIC ACID, PLASMA: Lactic Acid, Venous: 1.1 mmol/L (ref 0.5–2.2)

## 2011-11-10 MED ORDER — POTASSIUM CHLORIDE 10 MEQ/100ML IV SOLN: 10.0000 meq | INTRAVENOUS | Status: DC

## 2011-11-10 MED ORDER — FENTANYL CITRATE 0.05 MG/ML IJ SOLN
25.0000 ug | INTRAMUSCULAR | Status: DC | PRN
  Administered 2011-11-10: 50 ug via INTRAVENOUS
  Filled 2011-11-10: qty 2

## 2011-11-10 MED ORDER — POTASSIUM CHLORIDE 10 MEQ/50ML IV SOLN
10.0000 meq | INTRAVENOUS | Status: AC
  Administered 2011-11-10 (×4): 10 meq via INTRAVENOUS
  Filled 2011-11-10 (×2): qty 100

## 2011-11-10 MED ORDER — METOCLOPRAMIDE HCL 5 MG/ML IJ SOLN
10.0000 mg | Freq: Three times a day (TID) | INTRAMUSCULAR | Status: DC | PRN
  Filled 2011-11-10: qty 2

## 2011-11-10 NOTE — Progress Notes (Signed)
Name: Whitney [name] MRN: [id number] DOB: 10-10-1955    LOS: 5  PCCM PROGRESS NOTE  History of Present Illness: 56 yo AAF with recent history of dyspnea, chest pain and left leg pain sent to [facility] ED to r/o PE.  Suffered PEA cardiac arrest in CT scanner, was intubated and successfully resuscitated.  Subsequently developed hypertensive emergency associated with pulmonary edema and severe hypoxia.  Improved with Lasix / Nitroglycerine.  Brought to 2900 for hypothermia protocol.  Lines / Drains: 2/4  OETT >>2/8 2/4  OGT >>2/8 2/4  Foley 2/5  R IJ TLC  Cultures: 2/5  Blood 2/5  Sputum 2/5 Ur str/leg ag >> neg  Results for orders placed during the hospital encounter of 11/05/11  MRSA PCR SCREENING     Status: Normal   Collection Time   11/06/11  2:52 AM      Component Value Range Status Comment   MRSA by PCR NEGATIVE  NEGATIVE  Final   CULTURE, RESPIRATORY     Status: Normal   Collection Time   11/06/11  4:10 AM      Component Value Range Status Comment   Specimen Description ENDOTRACHEAL ASPIRATE   Final    Special Requests NONE   Final    Gram Stain     Final    Value: FEW WBC PRESENT, PREDOMINANTLY PMN     NO SQUAMOUS EPITHELIAL CELLS SEEN     NO ORGANISMS SEEN   Culture Non-Pathogenic Oropharyngeal-type Flora Isolated.   Final    Report Status 11/08/2011 FINAL   Final   CULTURE, BLOOD (ROUTINE X 2)     Status: Normal (Preliminary result)   Collection Time   11/06/11  5:25 AM      Component Value Range Status Comment   Specimen Description BLOOD LEFT RADIAL A-LINE   Final    Special Requests BOTTLES DRAWN AEROBIC AND ANAEROBIC 10CC EA   Final    Culture  Setup Time [id number]   Final    Culture     Final    Value:        BLOOD CULTURE RECEIVED NO GROWTH TO DATE CULTURE WILL BE HELD FOR 5 DAYS BEFORE ISSUING A FINAL NEGATIVE REPORT   Report Status PENDING   Incomplete   CULTURE, BLOOD (ROUTINE X 2)     Status: Normal (Preliminary result)   Collection Time   11/06/11  7:31 AM    Component Value Range Status Comment   Specimen Description BLOOD RIGHT HAND   Final    Special Requests BOTTLES DRAWN AEROBIC ONLY 3CC   Final    Culture  Setup Time [id number]   Final    Culture     Final    Value:        BLOOD CULTURE RECEIVED NO GROWTH TO DATE CULTURE WILL BE HELD FOR 5 DAYS BEFORE ISSUING A FINAL NEGATIVE REPORT   Report Status PENDING   Incomplete      Antibiotics: 2/5  Ceftriaxone >> 2/7 2/5  Levofloxacin >>2/7  Anti-infectives     Start     Dose/Rate Route Frequency Ordered Stop   11/06/11 0500   Levofloxacin (LEVAQUIN) IVPB 750 mg  Status:  Discontinued        750 mg 100 mL/hr over 90 Minutes Intravenous Every 24 hours 11/06/11 0318 11/08/11 0923   11/06/11 0400   cefTRIAXone (ROCEPHIN) 2 g in dextrose 5 % 50 mL IVPB  Status:  Discontinued  2 g 100 mL/hr over 30 Minutes Intravenous Every 24 hours 11/06/11 0318 11/08/11 9528           Tests / Events: 2/4  Chest CTA>>>Bilateral airspace disease, bilateral effusions, no PE 2/4 2D TTE >> LVEF 30-35%, LVH, RV mildly dilated, RAE 2/8 - failed swallow, only meds and sips ok  OVERNIGHT: Extubated yesterday. No resp distress. C/o abdominal pain - all night, diffuse, moderate, Associated with nausea and diarrhea. Needed some fentanyl for pain.Marland Kitchen Has rectal patch per RN. Continues to be 4L negative x 24h. SStill on nitro gtt per cards. Failed swallow eval yesterday  Vital Signs: Temp:  [98.5 F (36.9 C)-100.1 F (37.8 C)] 98.5 F (36.9 C) (02/09 0400) Pulse Rate:  [89-120] 115  (02/09 0600) Resp:  [18-36] 20  (02/08 2200) SpO2:  [95 %-98 %] 96 % (02/09 0600) Arterial Line BP: (90-140)/(68-83) 104/81 mmHg (02/09 0600) FiO2 (%):  [29.9 %-30.1 %] 30.1 % (02/08 1000) Weight:  [140.4 kg (309 lb 8.4 oz)] 140.4 kg (309 lb 8.4 oz) (02/09 0500) I/O last 3 completed shifts: In: 1211 [I.V.:763; NG/GT:440; IV Piggyback:8] Out: 6700 [Urine:6700]  Physical Examination: General:  Obese, deconditioned,    Neuro: RASS -1. GCS 15. CAM-ICU negative for delirium. Moves all 4s HEENT:  PERRL, pink conjunctivae, moist membranes Neck:  Cannot assess JVD Cardiovascular:  RRR, no M/R/G Lungs:  insp crackles in bases, scattered rhonchi Abdomen:  Obese, bowel sounds present Musculoskeletal:  Chronic stasis  / edema  Skin:  No rash  Ventilator settings: Vent Mode:  [-]  FiO2 (%):  [29.9 %-30.1 %] 30.1 %  Labs and Imaging:    CBC    Component Value Date/Time   WBC 10.5 11/10/2011 0500   RBC 4.75 11/10/2011 0500   HGB 12.3 11/10/2011 0500   HCT 39.4 11/10/2011 0500   PLT 278 11/10/2011 0500   MCV 82.9 11/10/2011 0500   MCH 25.9* 11/10/2011 0500   MCHC 31.2 11/10/2011 0500   RDW 15.3 11/10/2011 0500   LYMPHSABS 2.2 11/05/2011 2101   MONOABS 0.8 11/05/2011 2101   EOSABS 0.0 11/05/2011 2101   BASOSABS 0.0 11/05/2011 2101    BMET    Component Value Date/Time   NA 144 11/10/2011 0500   K 3.1* 11/10/2011 0500   CL 103 11/10/2011 0500   CO2 29 11/10/2011 0500   GLUCOSE 162* 11/10/2011 0500   BUN 22 11/10/2011 0500   CREATININE 0.57 11/10/2011 0500   CALCIUM 9.5 11/10/2011 0500   GFRNONAA >90 11/10/2011 0500   GFRAA >90 11/10/2011 0500   Dg Chest Port 1 View  11/09/2011  *RADIOLOGY REPORT*  Clinical Data: Assess endotracheal tube.  PORTABLE CHEST - 1 VIEW  Comparison: 11/08/2011.  Findings: Lower lung volumes.  Endotracheal tube tip 26 mm from the carina.  Right IJ central line is present with the tip at the cavoatrial junction.  Pulmonary aeration grossly appears unchanged, with bilateral basilar densities, likely atelectasis and airspace disease in combination.  The cardiopericardial silhouette unchanged.  The enteric tube has been retracted and is now within the upper thoracic esophagus.  IMPRESSION:  1.  Retraction of enteric tube with the tip in the upper to mid thoracic esophagus.  This should be advanced at least 30 cm. 2.  Lower lung volumes with otherwise unchanged pulmonary aeration. 3.  Other support apparatus stable.  This  is a call report.  Original Report Authenticated By: Andreas Newport, M.D.        ASSESSMENT AND PLAN  56 y/o female s/p respiratory arrest after coming to the [facility] ED for evaluation of dyspnea, chest pain and leg pain.  Had PEA arrest requiring CPR for 10 minutes, hypertensive afterwards.    NEUROLOGIC A:  Has been following commands well since rewarming and extubation P: Monitor  PULMONARY  Lab 11/09/11 0310 11/08/11 0432 11/08/11 0206 11/07/11 0349 11/06/11 2356  PHART 7.423* 7.378 7.435* 7.436* 7.449*  PCO2ART 44.5 43.8 40.4 30.3* 32.7*  PO2ART 91.7 82.0 86.1 67.0* 60.0*  HCO3 28.5* 25.7* 26.6* 21.4 23.5  O2SAT 97.1 95.0 97.4 97.0 95.0   A:  Hypercarbic hypoxemic acute respiratory failure, most likely due to acute pulmonary edema.  Suspect baseline OSA / OHS.  S/p extubation 11/09/11. Doing well 11/10/11 P: -->  Extubate today --> continue aggressive diuresis  -> sgtart nocturnal auto cpap empiric  CARDIOVASCULAR  Lab 11/06/11 1955 11/06/11 1345 11/06/11 0935 11/05/11 2102  TROPONINI 0.46* 0.60* 0.85* <0.30  LATICACIDVEN -- -- -- --   A:  Status post PEA arrest due to hypertensive emergency with flash pulmonary edema.  Initially hypertensive post arrest.  Pressure has been labile on hypothermia protocol but renal function stable and oxygenation improving with diuresis. Still massively volume up by my exam; as of 11/10/11 as well  P:  -->  Monitor BP , goal SBP <150 --> appreciate cardiology consult --> diurese again today -->  Continue start IV metop and IV hydralazine and IV nitroglyerin to prevent hypertension/flash since 11/09/11 --> once able to take po restart metop 25 bid and lisinopril 10 dailyl; cards monitoring (on 11/10/11 failed swallow and on npo except meds)  RENAL  Lab 11/10/11 0500 11/09/11 0450 11/08/11 0430 11/08/11 0105 11/07/11 1945 11/07/11 0745 11/07/11 0355  NA 144 146* 143 143 143 -- --  K 3.1* 3.5 -- -- -- -- --  CL 103 107 107 107 107 -- --  CO2 29  28 26 26 24  -- --  GLUCOSE 162* 199* 152* 150* 168* -- --  BUN 22 25* 15 15 14  -- --  CREATININE 0.57 0.73 0.64 0.60 0.57 -- --  CALCIUM 9.5 9.6 9.1 8.9 9.2 -- --  MG -- -- 1.7 -- -- 1.6 1.6  PHOS -- -- -- -- -- -- 3.3   A:  Normal renal function.  Hypokalemia. P: -->  daily K replacement (change to IV for now); done by elink 11/10/11 = check mag and phos 09/09/12    GASTROINTESTINAL  Lab 11/06/11 1345 11/05/11 2101  AST 69* 45*  ALT 89* 59*  ALKPHOS 156* 150*  BILITOT 1.2 0.8  PROT 6.7 7.3  ALBUMIN 2.9* 3.5   A:  Mildly elevated liver enzymes, possible shocked liver.  Improving. Having new abdominal pain and diarrhea 11/10/11 P: -->  Monitor and recheck 11/11/11 -> check lft, amylase, lipase, pct, ck, c diff pcr and kub 11/10/11 -> place ng tube to LIS -> dc zofran, use reglan  HEMATOLOGIC  Lab 11/10/11 0500 11/09/11 0450 11/08/11 0430 11/06/11 1536 11/06/11 1230 11/06/11 0244 11/05/11 2330 11/05/11 2101  HGB 12.3 11.7* 11.4* 13.9 -- -- 15.6* --  HCT 39.4 37.3 35.0* 41.0 -- -- 46.0 --  PLT 278 262 248 -- -- -- -- 299  INR -- -- -- -- 1.17 1.11 -- 1.01  APTT -- -- -- -- 28 29 -- 34   A:  No active issues P: -->  Monitor cbc   INFECTIOUS  Lab 11/10/11 0500 11/09/11 0450 11/08/11 0430 11/06/11 0329 11/05/11 2101  WBC 10.5 7.1 13.7* -- 14.9*  PROCALCITON -- -- -- 3.76 --   A:  Mildly elevated WBC.  No clear source of infection.  Pneumonia is a possibility though cardiogenic edema is more likely. P:   --> All antibiotics ended 11/08/11  ENDOCRINE  Lab 11/10/11 0335 11/09/11 2326 11/09/11 1927 11/09/11 1614 11/09/11 1125  GLUCAP 189* 168* 163* 188* 169*   A:  Diabetes / Hyperglycemia P: -->  Dc icu hypergycemia protocol; insulin gtt  BEST PRACTICE / DISPOSITION -->  ICU status under PCCM -->  Full code -->  Speech eval, npo for now -->  Heparin [place] for DVT Px -->  Protonix IV for GI Px  The patient is critically ill with multiple organ systems failure and  requires high complexity decision making for assessment and support, frequent evaluation and titration of therapies, application of advanced monitoring technologies and extensive interpretation of multiple databases. Critical Care Time devoted to patient care services described in this note is 30 minutes.  Dr. Kalman Shan, M.D., [facility]C.P Pulmonary and Critical Care Medicine Staff Physician [place] [place] Pulmonary and Critical Care Pager: [phone], If no answer or between  15:00h - 7:00h: call [phone]  11/10/2011 8:32 AM

## 2011-11-10 NOTE — Progress Notes (Signed)
Speech Language/Pathology Speech Language Pathology Swallow Treatment Patient Details Name: Whitney [name] MRN: [id number] DOB: [birth year] Today's Date: 11/10/2011  SLP Assessment/Plan/Recommendation Assessment Clinical Impression Statement: Patient seen by ST to reassess the swallow for possible PO intake following results of initial BSE completed on 11-09-11.  Initial evaluation indicates patient presented with dysphonia, weak cough response, with immediate coughing after thin liquid trials. Recommendations made from BSE continued NPO status with exception of ice chips and medication administered in applesauce.  Patient improved this date from prior evaluation as she presents with clear vocal quality and strong volitional cough and throat clear.  Patient observed with puree, mechanical soft consistency, regular consistency, and thin liquid by cup with no clinical s/s of aspiration.  Minimal verbal cueing required for patient to take small cup sips.  Results indicate oropharyngeal swallow  functional for regular consistency and thin liquids. ST to follow for diet tolerance and to provide education to patient and caregivers on aspiration precautions to ensure safety due to recent intubation. Consulted and Agree with Results and Recommendations: Patient;Family member/caregiver  Swallowing Goals  SLP Swallowing Goals Patient will consume recommended diet without observed clinical signs of aspiration with: Supervision/safety Patient will utilize recommended strategies during swallow to increase swallowing safety with: Supervision/safety  General Patient observed directly with PO's: Yes Type of PO's observed: Regular;Dysphagia 3 (soft);Dysphagia 1 (puree);Thin liquids;Ice chips Feeding: Able to feed self;Needs set up Liquids provided via: Cup;Straw Non oral means: IV Respiratory Status: Supplemental O2 delivered via (comment) Behavior/Cognition: Alert;Cooperative;Pleasant mood Oral Cavity - Dentition:  Adequate natural dentition Patient Positioning: Partially reclined  Oral Cavity - Oral Hygiene Brush patient's teeth BID with toothbrush (using toothpaste with fluoride): Yes   Assessment of Diet Tolerance Clinical Impression Statement: Oropharyngeal swallow appears functional for regular consistency and thin liquids. ST to follow for diet tolerance and to provide education to patient and caregivers on aspiration precautions to ensure safety due to recent intubation.  Supervision/Assistance required: Yes Type of cueing: Verbal Amount of cueing: Minimal Recommendations: Advance diet to regular consistency ( prior dietary restrictions) with thin liquids with full supervision with all meals.   Moreen Fowler M.S., CCC-SLP [phone] [facility] 11/10/2011, 2:46 PM

## 2011-11-10 NOTE — Progress Notes (Signed)
11/10/11 2200 Pt refusing CPAP QHS at this time. RT will continue to monitor.

## 2011-11-10 NOTE — Progress Notes (Signed)
Subjective:  56 year old with morbid obesity, acute systolic HF, s/p PEA arrest (likley acute volume overload in CT), respiratory failure, hypothermia protocol  Doing better. No SOB. Talking well. No CP. Off vent.  Objective:  Vital Signs in the last 24 hours: Temp:  [98.3 F (36.8 C)-100.1 F (37.8 C)] 98.3 F (36.8 C) (02/09 0800) Pulse Rate:  [89-120] 115  (02/09 0600) Resp:  [18-36] 20  (02/08 2200) SpO2:  [95 %-98 %] 96 % (02/09 0600) Arterial Line BP: (90-140)/(68-83) 104/81 mmHg (02/09 0600) FiO2 (%):  [29.9 %-30.1 %] 30.1 % (02/08 1000) Weight:  [140.4 kg (309 lb 8.4 oz)] 140.4 kg (309 lb 8.4 oz) (02/09 0500)  Intake/Output from previous day: 02/08 0701 - 02/09 0700 In: 523 [I.V.:523] Out: 3900 [Urine:3900]   Physical Exam:  AAO x 3 in bed TACHY RR, no m. Distant HS Obese +BS NT Decreased edema BLE CTA ant. Foley   Lab Results:  Basename 11/10/11 0500 11/09/11 0450  WBC 10.5 7.1  HGB 12.3 11.7*  PLT 278 262    Basename 11/10/11 0500 11/09/11 0450  NA 144 146*  K 3.1* 3.5  CL 103 107  CO2 29 28  GLUCOSE 162* 199*  BUN 22 25*  CREATININE 0.57 0.73  Imaging: Dg Chest Port 1 View  11/10/2011  *RADIOLOGY REPORT*  Clinical Data: Pulmonary infiltrates.  PORTABLE CHEST - 1 VIEW  Comparison: 11/09/2011.  Findings: The endotracheal tube and NG tubes have been removed. The right IJ catheter is stable.  Low lung volumes with bibasilar atelectasis and/or infiltrates.  No effusions or edema.  IMPRESSION:  1.  Removal of ET and NG tubes. 2.  Low lung volumes with bibasilar atelectasis and/or infiltrates.  Original Report Authenticated By: P. Loralie Champagne, M.D.   Dg Chest Port 1 View  11/09/2011  *RADIOLOGY REPORT*  Clinical Data: Assess endotracheal tube.  PORTABLE CHEST - 1 VIEW  Comparison: 11/08/2011.  Findings: Lower lung volumes.  Endotracheal tube tip 26 mm from the carina.  Right IJ central line is present with the tip at the cavoatrial junction.  Pulmonary  aeration grossly appears unchanged, with bilateral basilar densities, likely atelectasis and airspace disease in combination.  The cardiopericardial silhouette unchanged.  The enteric tube has been retracted and is now within the upper thoracic esophagus.  IMPRESSION:  1.  Retraction of enteric tube with the tip in the upper to mid thoracic esophagus.  This should be advanced at least 30 cm. 2.  Lower lung volumes with otherwise unchanged pulmonary aeration. 3.  Other support apparatus stable.  This is a call report.  Original Report Authenticated By: Andreas Newport, M.D.   Personally viewed.   Telemetry: STach, no VT, no pauses Personally viewed.   Cardiac Studies:  EF 25%  Assessment/Plan:   56 year old status post cardiac arrest/PEA arrest, acute systolic heart failure, respiratory failure, morbid obesity  - Off vent - great - net out 13 liters - continue with lasix 80 IV Q 8. Creat normal - EF 25% - when able to swallow pills - lisinopril 10mg  and toprol 50 and wean NTG IV  - BP much improved with decreased intravascular volume, - weight loss - repeating speech eval.      SKAINS, MARK 11/10/2011, 8:55 AM

## 2011-11-10 NOTE — Progress Notes (Signed)
eLink Physician-Brief Progress Note Patient Name: Whitney [name] DOB: 30-Sep-1956 MRN: [id number]  Date of Service  11/10/2011   HPI/Events of Note  Hypokalemia   eICU Interventions  Potassium replaced   Intervention Category Intermediate Interventions: Electrolyte abnormality - evaluation and management  DETERDING,ELIZABETH 11/10/2011, 6:31 AM

## 2011-11-11 DIAGNOSIS — I10 Essential (primary) hypertension: Secondary | ICD-10-CM

## 2011-11-11 DIAGNOSIS — I5021 Acute systolic (congestive) heart failure: Secondary | ICD-10-CM | POA: Diagnosis not present

## 2011-11-11 DIAGNOSIS — R109 Unspecified abdominal pain: Secondary | ICD-10-CM

## 2011-11-11 DIAGNOSIS — I469 Cardiac arrest, cause unspecified: Secondary | ICD-10-CM | POA: Diagnosis not present

## 2011-11-11 DIAGNOSIS — J9600 Acute respiratory failure, unspecified whether with hypoxia or hypercapnia: Secondary | ICD-10-CM

## 2011-11-11 LAB — GLUCOSE, CAPILLARY
Glucose-Capillary: 139 mg/dL — ABNORMAL HIGH (ref 70–99)
Glucose-Capillary: 159 mg/dL — ABNORMAL HIGH (ref 70–99)
Glucose-Capillary: 184 mg/dL — ABNORMAL HIGH (ref 70–99)
Glucose-Capillary: 159 mg/dL — ABNORMAL HIGH (ref 70–99)

## 2011-11-11 LAB — COMPREHENSIVE METABOLIC PANEL
ALT: 27 U/L (ref 0–35)
Alkaline Phosphatase: 182 U/L — ABNORMAL HIGH (ref 39–117)
BUN: 24 mg/dL — ABNORMAL HIGH (ref 6–23)
CO2: 30 mEq/L (ref 19–32)
Chloride: 100 mEq/L (ref 96–112)
GFR calc Af Amer: >90 mL/min (ref >90)
Glucose, Bld: 151 mg/dL — ABNORMAL HIGH (ref 70–99)
Potassium: 3.9 mEq/L (ref 3.5–5.1)
Sodium: 141 mEq/L (ref 135–145)
Total Bilirubin: 1.9 mg/dL — ABNORMAL HIGH (ref 0.3–1.2)
Total Protein: 7.6 g/dL (ref 6.0–8.3)

## 2011-11-11 LAB — MAGNESIUM: Magnesium: 2.2 mg/dL (ref 1.5–2.5)

## 2011-11-11 MED ORDER — FUROSEMIDE 40 MG PO TABS
40.0000 mg | ORAL_TABLET | Freq: Every day | ORAL | Status: DC
  Administered 2011-11-11 – 2011-11-15 (×5): 40 mg via ORAL
  Filled 2011-11-11 (×5): qty 1

## 2011-11-11 MED ORDER — LISINOPRIL 20 MG PO TABS
20.0000 mg | ORAL_TABLET | Freq: Every day | ORAL | Status: DC
  Administered 2011-11-11 – 2011-11-15 (×5): 20 mg via ORAL
  Filled 2011-11-11 (×5): qty 1

## 2011-11-11 MED ORDER — INSULIN ASPART 100 UNIT/ML ~~LOC~~ SOLN
0.0000 [IU] | Freq: Three times a day (TID) | SUBCUTANEOUS | Status: DC
  Administered 2011-11-11 (×2): 4 [IU] via SUBCUTANEOUS
  Administered 2011-11-12: 7 [IU] via SUBCUTANEOUS
  Administered 2011-11-12: 4 [IU] via SUBCUTANEOUS
  Administered 2011-11-12: 3 [IU] via SUBCUTANEOUS
  Administered 2011-11-13: 4 [IU] via SUBCUTANEOUS
  Administered 2011-11-13: 7 [IU] via SUBCUTANEOUS
  Administered 2011-11-13 – 2011-11-14 (×2): 4 [IU] via SUBCUTANEOUS
  Administered 2011-11-14: 3 [IU] via SUBCUTANEOUS
  Administered 2011-11-14 – 2011-11-15 (×3): 4 [IU] via SUBCUTANEOUS

## 2011-11-11 MED ORDER — PANTOPRAZOLE SODIUM 40 MG PO TBEC
40.0000 mg | DELAYED_RELEASE_TABLET | Freq: Every day | ORAL | Status: DC
  Administered 2011-11-12 – 2011-11-15 (×4): 40 mg via ORAL
  Filled 2011-11-11 (×4): qty 1

## 2011-11-11 MED ORDER — METOPROLOL SUCCINATE ER 50 MG PO TB24
50.0000 mg | ORAL_TABLET | Freq: Every day | ORAL | Status: DC
  Administered 2011-11-11: 50 mg via ORAL
  Filled 2011-11-11 (×2): qty 1

## 2011-11-11 NOTE — Progress Notes (Addendum)
Subjective:  56 year old with morbid obesity, acute systolic HF, s/p PEA arrest (likley acute volume overload in CT), respiratory failure, hypothermia protocol  Doing better. No SOB. Talking well. No CP. Very thankful. Neck bothering her at IJ site. Wants to get out of bed. +HA (NTG)  Objective:  Vital Signs in the last 24 hours: Temp:  [97.4 F (36.3 C)-98.3 F (36.8 C)] 98 F (36.7 C) (02/09 1600) Pulse Rate:  [86-113] 88  (02/10 0615) Resp:  [15-31] 15  (02/10 0615) SpO2:  [95 %-97 %] 97 % (02/10 0530) Arterial Line BP: (90-155)/(62-115) 122/77 mmHg (02/10 0615) Weight:  [132.1 kg (291 lb 3.6 oz)] 132.1 kg (291 lb 3.6 oz) (02/10 0630)  Intake/Output from previous day: 02/09 0701 - 02/10 0700 In: 483 [I.V.:483] Out: 1600 [Urine:1600]   Physical Exam: AAO x 3 in bed  TACHY RR, no m. Distant HS  Obese +BS NT  Decreased edema BLE  CTA ant.  Foley     Lab Results:  Basename 11/10/11 0500 11/09/11 0450  WBC 10.5 7.1  HGB 12.3 11.7*  PLT 278 262    Basename 11/11/11 0630 11/10/11 0930  NA 141 143  K 3.9 3.6  CL 100 99  CO2 30 31  GLUCOSE 151* 162*  BUN 24* 24*  CREATININE 0.60 0.66   No results found for this basename: TROPONINI:2,CK,MB:2 in the last 72 hours Hepatic Function Panel  Basename 11/11/11 0630  PROT 7.6  ALBUMIN 3.3*  AST 22  ALT 27  ALKPHOS 182*  BILITOT 1.9*  BILIDIR --  IBILI --   Imaging: Dg Chest Port 1 View  11/10/2011  *RADIOLOGY REPORT*  Clinical Data: Pulmonary infiltrates.  PORTABLE CHEST - 1 VIEW  Comparison: 11/09/2011.  Findings: The endotracheal tube and NG tubes have been removed. The right IJ catheter is stable.  Low lung volumes with bibasilar atelectasis and/or infiltrates.  No effusions or edema.  IMPRESSION:  1.  Removal of ET and NG tubes. 2.  Low lung volumes with bibasilar atelectasis and/or infiltrates.  Original Report Authenticated By: P. Loralie Champagne, M.D.   Dg Abd Portable 1v  11/10/2011  *RADIOLOGY REPORT*   Clinical Data: Abdominal pain  PORTABLE ABDOMEN - 1 VIEW  Comparison: [place] [facility] CT abdomen pelvis dated 07/04/2004  Findings: A portion of the colon, likely transverse colon, is gas- filled but otherwise unremarkable.  No disproportionate dilatation of the small bowel to suggest small bowel obstruction.  Degenerative changes of the visualized thoracolumbar spine.  IMPRESSION: No findings to suggest small bowel obstruction.  Original Report Authenticated By: Charline Bills, M.D.   Personally viewed.   Telemetry: no pasues no VT Personally viewed.   Cardiac Studies:  EF 25%  Assessment/Plan:   56 year old status post cardiac arrest/PEA arrest, acute systolic heart failure, respiratory failure, morbid obesity  - Off vent - great  - net out 15 liters - was on lasix 80 IV Q 8. Must have been stopped. Not on active orders. Will change to PO. 40mg  QD Creat normal  - EF 25% - able to swallow pills - lisinopril 10mg  and toprol 50 and wean NTG IV. Will start PO meds. BNP 118. YES! - BP much improved with decreased intravascular volume,  - weight loss  - speech eval repeat done -start to mobilize, PT/OT -consider removal of IJ line.  -Creat stable. Will likely switch to PO lasix tomorrow.    Slayden Mennenga 11/11/2011, 7:26 AM

## 2011-11-11 NOTE — Progress Notes (Signed)
Name: Whitney [name] MRN: [id number] DOB: 02-06-1956    LOS: 6  PCCM PROGRESS NOTE  History of Present Illness: 56 yo AAF with recent history of dyspnea, chest pain and left leg pain sent to [facility] ED to r/o PE.  Suffered PEA cardiac arrest in CT scanner, was intubated and successfully resuscitated.  Subsequently developed hypertensive emergency associated with pulmonary edema and severe hypoxia.  Improved with Lasix / Nitroglycerine.  Brought to 2900 for hypothermia protocol.  Lines / Drains: 2/4  OETT >>2/8 2/4  OGT >>2/8 2/4  Foley 2/5  R IJ TLC  Cultures: 2/5  Blood 2/5  Sputum 2/5 Ur str/leg ag >> neg  Results for orders placed during the hospital encounter of 11/05/11  MRSA PCR SCREENING     Status: Normal   Collection Time   11/06/11  2:52 AM      Component Value Range Status Comment   MRSA by PCR NEGATIVE  NEGATIVE  Final   CULTURE, RESPIRATORY     Status: Normal   Collection Time   11/06/11  4:10 AM      Component Value Range Status Comment   Specimen Description ENDOTRACHEAL ASPIRATE   Final    Special Requests NONE   Final    Gram Stain     Final    Value: FEW WBC PRESENT, PREDOMINANTLY PMN     NO SQUAMOUS EPITHELIAL CELLS SEEN     NO ORGANISMS SEEN   Culture Non-Pathogenic Oropharyngeal-type Flora Isolated.   Final    Report Status 11/08/2011 FINAL   Final   CULTURE, BLOOD (ROUTINE X 2)     Status: Normal (Preliminary result)   Collection Time   11/06/11  5:25 AM      Component Value Range Status Comment   Specimen Description BLOOD LEFT RADIAL A-LINE   Final    Special Requests BOTTLES DRAWN AEROBIC AND ANAEROBIC 10CC EA   Final    Culture  Setup Time [id number]   Final    Culture     Final    Value:        BLOOD CULTURE RECEIVED NO GROWTH TO DATE CULTURE WILL BE HELD FOR 5 DAYS BEFORE ISSUING A FINAL NEGATIVE REPORT   Report Status PENDING   Incomplete   CULTURE, BLOOD (ROUTINE X 2)     Status: Normal (Preliminary result)   Collection Time   11/06/11  7:31 AM    Component Value Range Status Comment   Specimen Description BLOOD RIGHT HAND   Final    Special Requests BOTTLES DRAWN AEROBIC ONLY 3CC   Final    Culture  Setup Time [id number]   Final    Culture     Final    Value:        BLOOD CULTURE RECEIVED NO GROWTH TO DATE CULTURE WILL BE HELD FOR 5 DAYS BEFORE ISSUING A FINAL NEGATIVE REPORT   Report Status PENDING   Incomplete   CLOSTRIDIUM DIFFICILE BY PCR     Status: Normal   Collection Time   11/10/11  6:49 PM      Component Value Range Status Comment   C difficile by pcr NEGATIVE  NEGATIVE  Final      Antibiotics: 2/5  Ceftriaxone >> 2/7 2/5  Levofloxacin >>2/7  Anti-infectives     Start     Dose/Rate Route Frequency Ordered Stop   11/06/11 0500   Levofloxacin (LEVAQUIN) IVPB 750 mg  Status:  Discontinued  750 mg 100 mL/hr over 90 Minutes Intravenous Every 24 hours 11/06/11 0318 11/08/11 0923   11/06/11 0400   cefTRIAXone (ROCEPHIN) 2 g in dextrose 5 % 50 mL IVPB  Status:  Discontinued        2 g 100 mL/hr over 30 Minutes Intravenous Every 24 hours 11/06/11 0318 11/08/11 0923           Tests / Events: 2/4  Chest CTA>>>Bilateral airspace disease, bilateral effusions, no PE 2/4 2D TTE >> LVEF 30-35%, LVH, RV mildly dilated, RAE 2/8 - failed swallow, only meds and sips ok 2/9  - passed swallow. abd pain + 2/10 -  Off nitro gtt per cards. Po lasix started. Po ACe inhibitor started. ABd pain better  OVERNIGHT: Passed swallow eval. NEt negative. Stil with abdominal pain but improved. Bed bound and not motivated. Refused auto cpap last night. Off nitro gtt. Cards starting po meds today  Vital Signs: Temp:  [97.4 F (36.3 C)-98.7 F (37.1 C)] 98.7 F (37.1 C) (02/10 0700) Pulse Rate:  [86-113] 88  (02/10 0615) Resp:  [15-31] 15  (02/10 0615) BP: (131)/(68) 131/68 mmHg (02/10 0800) SpO2:  [95 %-99 %] 99 % (02/10 0800) Arterial Line BP: (90-155)/(62-115) 122/77 mmHg (02/10 0615) Weight:  [132.1 kg (291 lb 3.6 oz)]  132.1 kg (291 lb 3.6 oz) (02/10 0630) I/O last 3 completed shifts: In: 803 [I.V.:803] Out: 3350 [Urine:3350]  Physical Examination: General:  Obese, deconditioned,  Neuro: RASS -1. GCS 15. CAM-ICU negative for delirium. Moves all 4s HEENT:  PERRL, pink conjunctivae, moist membranes Neck:  Cannot assess JVD Cardiovascular:  RRR, no M/R/G Lungs:  insp crackles in bases, scattered rhonchi Abdomen:  Obese, bowel sounds present Musculoskeletal:  Chronic stasis  / edema  Skin:  No rash   Labs and Imaging:    CBC    Component Value Date/Time   WBC 10.5 11/10/2011 0500   RBC 4.75 11/10/2011 0500   HGB 12.3 11/10/2011 0500   HCT 39.4 11/10/2011 0500   PLT 278 11/10/2011 0500   MCV 82.9 11/10/2011 0500   MCH 25.9* 11/10/2011 0500   MCHC 31.2 11/10/2011 0500   RDW 15.3 11/10/2011 0500   LYMPHSABS 2.2 11/05/2011 2101   MONOABS 0.8 11/05/2011 2101   EOSABS 0.0 11/05/2011 2101   BASOSABS 0.0 11/05/2011 2101    BMET    Component Value Date/Time   NA 141 11/11/2011 0630   K 3.9 11/11/2011 0630   CL 100 11/11/2011 0630   CO2 30 11/11/2011 0630   GLUCOSE 151* 11/11/2011 0630   BUN 24* 11/11/2011 0630   CREATININE 0.60 11/11/2011 0630   CALCIUM 10.0 11/11/2011 0630   GFRNONAA >90 11/11/2011 0630   GFRAA >90 11/11/2011 0630   Dg Chest Port 1 View  11/10/2011  *RADIOLOGY REPORT*  Clinical Data: Pulmonary infiltrates.  PORTABLE CHEST - 1 VIEW  Comparison: 11/09/2011.  Findings: The endotracheal tube and NG tubes have been removed. The right IJ catheter is stable.  Low lung volumes with bibasilar atelectasis and/or infiltrates.  No effusions or edema.  IMPRESSION:  1.  Removal of ET and NG tubes. 2.  Low lung volumes with bibasilar atelectasis and/or infiltrates.  Original Report Authenticated By: P. Loralie Champagne, M.D.   Dg Abd Portable 1v  11/10/2011  *RADIOLOGY REPORT*  Clinical Data: Abdominal pain  PORTABLE ABDOMEN - 1 VIEW  Comparison: [facility] CT abdomen pelvis dated 07/04/2004  Findings: A portion of  the colon, likely transverse colon, is gas- filled  but otherwise unremarkable.  No disproportionate dilatation of the small bowel to suggest small bowel obstruction.  Degenerative changes of the visualized thoracolumbar spine.  IMPRESSION: No findings to suggest small bowel obstruction.  Original Report Authenticated By: Charline Bills, M.D.        ASSESSMENT AND PLAN  56 y/o female s/p respiratory arrest after coming to the [facility] ED for evaluation of dyspnea, chest pain and leg pain.  Had PEA arrest requiring CPR for 10 minutes, hypertensive afterwards.    NEUROLOGIC A:  Has been following commands well since rewarming and extubation P: Monitor  PULMONARY  Lab 11/09/11 0310 11/08/11 0432 11/08/11 0206 11/07/11 0349 11/06/11 2356  PHART 7.423* 7.378 7.435* 7.436* 7.449*  PCO2ART 44.5 43.8 40.4 30.3* 32.7*  PO2ART 91.7 82.0 86.1 67.0* 60.0*  HCO3 28.5* 25.7* 26.6* 21.4 23.5  O2SAT 97.1 95.0 97.4 97.0 95.0   A:  Hypercarbic hypoxemic acute respiratory failure, most likely due to acute pulmonary edema.  Suspect baseline OSA / OHS.  S/p extubation 11/09/11. Doing well 11/10/11 and 11/11/11 P: --> > continue diuresis  -> sgtart nocturnal auto cpap empiric - > out of bed to chair, get PT consult - > start IS 11/11/11  CARDIOVASCULAR  Lab 11/10/11 0930 11/06/11 1955 11/06/11 1345 11/06/11 0935 11/05/11 2102  TROPONINI -- 0.46* 0.60* 0.85* <0.30  LATICACIDVEN 1.1 -- -- -- --    Lab 11/11/11 0631 11/05/11 2307  PROBNP 118.6 1797.0*     A:  Status post PEA arrest due to hypertensive emergency with flash pulmonary edema.  Initially hypertensive post arrest.  Pressure has been labile on hypothermia protocol but renal function stable and oxygenation improving with diuresis. Still massively volume up by my exam; as of 11/10/11 and 11/11/11 as well. BNP 118 on 2/18  P:  -->  Monitor BP , goal SBP <150 --> appreciate cardiology consult --> diurese again today - started on PO --> PO meds  started 11/11/11  RENAL  Lab 11/11/11 0630 11/10/11 0930 11/10/11 0500 11/09/11 0450 11/08/11 0430 11/07/11 0745 11/07/11 0355  NA 141 143 144 146* 143 -- --  K 3.9 3.6 -- -- -- -- --  CL 100 99 103 107 107 -- --  CO2 30 31 29 28 26  -- --  GLUCOSE 151* 162* 162* 199* 152* -- --  BUN 24* 24* 22 25* 15 -- --  CREATININE 0.60 0.66 0.57 0.73 0.64 -- --  CALCIUM 10.0 10.4 9.5 9.6 9.1 -- --  MG 2.2 -- -- -- 1.7 1.6 1.6  PHOS 3.6 -- -- -- -- -- 3.3   A:  Normal renal function.  Hypokalemia resolved. P: -monitor   GASTROINTESTINAL  Lab 11/11/11 0630 11/10/11 0930 11/06/11 1345 11/05/11 2101  AST 22 17 69* 45*  ALT 27 32 89* 59*  ALKPHOS 182* 151* 156* 150*  BILITOT 1.9* 1.8* 1.2 0.8  PROT 7.6 8.3 6.7 7.3  ALBUMIN 3.3* 3.5 2.9* 3.5   A:  Mildly elevated liver enzymes, possible shocked liver.  Improving. Having new abdominal pain and diarrhea 11/10/11. AXR and labd negative including c dif negative 11/10/11. Improved pain 11/11/11 - she thinks this is due to gas P: --> prn reglan -> prn maalox -> dc fentanyl prn  HEMATOLOGIC  Lab 11/10/11 0500 11/09/11 0450 11/08/11 0430 11/06/11 1536 11/06/11 1230 11/06/11 0244 11/05/11 2330 11/05/11 2101  HGB 12.3 11.7* 11.4* 13.9 -- -- 15.6* --  HCT 39.4 37.3 35.0* 41.0 -- -- 46.0 --  PLT 278 262 248 -- -- -- --  299  INR -- -- -- -- 1.17 1.11 -- 1.01  APTT -- -- -- -- 28 29 -- 34   A:  No active issues P: -->  Monitor cbc   INFECTIOUS  Lab 11/10/11 0500 11/09/11 0450 11/08/11 0430 11/06/11 0329 11/05/11 2101  WBC 10.5 7.1 13.7* -- 14.9*  PROCALCITON -- -- -- 3.76 --   A:  Mildly elevated WBC.  No clear source of infection.  Pneumonia is a possibility though cardiogenic edema is more likely. P:   --> All antibiotics ended 11/08/11  ENDOCRINE  Lab 11/11/11 0802 11/11/11 0359 11/11/11 0045 11/10/11 2021 11/10/11 1627  GLUCAP 159* 160* 98 139* 202*   A:  Diabetes / Hyperglycemia P: -->  Dc icu hypergycemia protocol; insulin gtt ->  use non--pregnant ssi  BEST PRACTICE / DISPOSITION -->  ICU status under PCCM, consider SDU/Tele transfer 11/12/11  -->  Full code -->  -->  Heparin [place] for DVT Px -->  Protonix IV for GI Px -> PT.OT consult  .  Dr. Kalman Shan, M.D., [facility].C.P Pulmonary and Critical Care Medicine Staff Physician [facility] [place] [facility] Pager: [phone], If no answer or between  15:00h - 7:00h: call [phone]  11/11/2011 9:06 AM

## 2011-11-12 DIAGNOSIS — I469 Cardiac arrest, cause unspecified: Secondary | ICD-10-CM | POA: Diagnosis not present

## 2011-11-12 DIAGNOSIS — I10 Essential (primary) hypertension: Secondary | ICD-10-CM | POA: Diagnosis not present

## 2011-11-12 DIAGNOSIS — I5021 Acute systolic (congestive) heart failure: Secondary | ICD-10-CM | POA: Diagnosis not present

## 2011-11-12 DIAGNOSIS — J810 Acute pulmonary edema: Secondary | ICD-10-CM

## 2011-11-12 DIAGNOSIS — R109 Unspecified abdominal pain: Secondary | ICD-10-CM | POA: Diagnosis not present

## 2011-11-12 DIAGNOSIS — J9600 Acute respiratory failure, unspecified whether with hypoxia or hypercapnia: Secondary | ICD-10-CM | POA: Diagnosis not present

## 2011-11-12 LAB — BASIC METABOLIC PANEL
BUN: 24 mg/dL — ABNORMAL HIGH (ref 6–23)
GFR calc Af Amer: >90 mL/min (ref >90)
GFR calc non Af Amer: >90 mL/min (ref >90)
Potassium: 3.7 mEq/L (ref 3.5–5.1)

## 2011-11-12 LAB — CULTURE, BLOOD (ROUTINE X 2)
Culture  Setup Time: 201302050819
Culture  Setup Time: 201302051357
Culture: NO GROWTH
Culture: NO GROWTH

## 2011-11-12 LAB — GLUCOSE, CAPILLARY: Glucose-Capillary: 203 mg/dL — ABNORMAL HIGH (ref 70–99)

## 2011-11-12 MED ORDER — POTASSIUM CHLORIDE CRYS ER 20 MEQ PO TBCR
40.0000 meq | EXTENDED_RELEASE_TABLET | Freq: Once | ORAL | Status: AC
  Administered 2011-11-12: 40 meq via ORAL
  Filled 2011-11-12: qty 2

## 2011-11-12 MED ORDER — SIMVASTATIN 10 MG PO TABS
10.0000 mg | ORAL_TABLET | Freq: Every day | ORAL | Status: DC
  Administered 2011-11-12 – 2011-11-14 (×3): 10 mg via ORAL
  Filled 2011-11-12 (×4): qty 1

## 2011-11-12 MED ORDER — ASPIRIN EC 81 MG PO TBEC
81.0000 mg | DELAYED_RELEASE_TABLET | Freq: Every day | ORAL | Status: DC
  Administered 2011-11-12 – 2011-11-15 (×4): 81 mg via ORAL
  Filled 2011-11-12 (×4): qty 1

## 2011-11-12 MED ORDER — METOPROLOL SUCCINATE ER 100 MG PO TB24
100.0000 mg | ORAL_TABLET | Freq: Every day | ORAL | Status: DC
  Administered 2011-11-12 – 2011-11-15 (×4): 100 mg via ORAL
  Filled 2011-11-12 (×4): qty 1

## 2011-11-12 MED ORDER — OXYBUTYNIN CHLORIDE ER 5 MG PO TB24
5.0000 mg | ORAL_TABLET | Freq: Every day | ORAL | Status: DC
  Administered 2011-11-12 – 2011-11-15 (×4): 5 mg via ORAL
  Filled 2011-11-12 (×4): qty 1

## 2011-11-12 MED ORDER — BIOTENE DRY MOUTH MT LIQD
15.0000 mL | Freq: Two times a day (BID) | OROMUCOSAL | Status: DC
  Administered 2011-11-12 – 2011-11-15 (×6): 15 mL via OROMUCOSAL

## 2011-11-12 NOTE — Evaluation (Signed)
Whitney [name],PT Acute Rehabilitation [phone] (pager)  

## 2011-11-12 NOTE — Progress Notes (Addendum)
Subjective:  Great improvement. Chest "hurts/sore". Likely from prolonged bed rest/CPR  No SOB  Objective:  Vital Signs in the last 24 hours: Temp:  [97.7 F (36.5 C)-98.9 F (37.2 C)] 98.7 F (37.1 C) (02/11 0400) Pulse Rate:  [86-109] 88  (02/11 0500) Resp:  [18-33] 21  (02/11 0500) BP: (91-131)/(46-71) 124/64 mmHg (02/11 0500) SpO2:  [96 %-99 %] 96 % (02/11 0500) FiO2 (%):  [4 %] 4 % (02/11 0010) Weight:  [130.9 kg (288 lb 9.3 oz)] 130.9 kg (288 lb 9.3 oz) (02/11 0500)  Intake/Output from previous day: 02/10 0701 - 02/11 0700 In: 600 [P.O.:580; I.V.:20] Out: 1145 [Urine:1145]   Physical Exam: General: Well developed, well nourished, in no acute distress. Head:  Normocephalic and atraumatic. Lungs: Clear to auscultation ant Heart: Normal S1 and S2.  2/6 S murmur apex Abdomen: soft, non-tender, positive bowel sounds. Obese Extremities: No clubbing or cyanosis.Much decreased edema. Neurologic: Alert and oriented x 3.    Lab Results:  Basename 11/10/11 0500  WBC 10.5  HGB 12.3  PLT 278    Basename 11/12/11 0435 11/11/11 0630  NA 137 141  K 3.7 3.9  CL 100 100  CO2 32 30  GLUCOSE 150* 151*  BUN 24* 24*  CREATININE 0.59 0.60   No results found for this basename: TROPONINI:2,CK,MB:2 in the last 72 hours Hepatic Function Panel  Basename 11/11/11 0630  PROT 7.6  ALBUMIN 3.3*  AST 22  ALT 27  ALKPHOS 182*  BILITOT 1.9*  BILIDIR --  IBILI --  Imaging: Dg Abd Portable 1v  11/10/2011  *RADIOLOGY REPORT*  Clinical Data: Abdominal pain  PORTABLE ABDOMEN - 1 VIEW  Comparison: [facility] CT abdomen pelvis dated 07/04/2004  Findings: A portion of the colon, likely transverse colon, is gas- filled but otherwise unremarkable.  No disproportionate dilatation of the small bowel to suggest small bowel obstruction.  Degenerative changes of the visualized thoracolumbar spine.  IMPRESSION: No findings to suggest small bowel obstruction.  Original Report Authenticated  By: Charline Bills, M.D.   Personally viewed.   Telemetry: No VTonally viewed.   Cardiac Studies:  EF 25% Assessment/Plan:   56 year old status post cardiac arrest/PEA arrest, acute systolic heart failure, respiratory failure, morbid obesity   HF - continue lasix 40 PO QD. Increase metoprolol ER to 100mg  QD. Keep lisinopril 20 QD. Net out 16L.   PT/OT- OOB PIV - dc central line Case man.   Much improved.   Will continue to follow in consultation. Deconditioning needs.   Whitney [name] 11/12/2011, 7:54 AM

## 2011-11-12 NOTE — Progress Notes (Signed)
Speech Pathology: Dysphagia Treatment Note  Patient was observed with : Mechanical Soft / Ground and Thin liquids.  Patient was noted to have s/s of aspiration : No  Lung Sounds:  Clear, diminished Temperature: 98.6  Patient required: Independently followed precautions/strategies consistently.  Clinical Impression: Pt observed by SLP for current diet tolerance. Pt does not express concerns regarding swallowing po intake, and no overt s/s of aspiration were noted during observation. Clinician provided pt education regarding swallowing precautions as related to mild dysphagia noted after extubation. At this time, pt appears to be tolerating current diet. No further SLP services needed at this time.  Recommendations:  1. Continue current diet 2. No SLP f/u needed at this time  Pain:   none Intervention Required:   No  Goals: All Goals Met

## 2011-11-12 NOTE — Progress Notes (Signed)
Nutrition Follow-up  Previously receiving TF, discontinued with extubation and swallow evaluation. Pt extubated 2/8. BSE completed 2/8 recommending NPO 2/2 dysphonia. Swallowing observed again on 2/9 recommending Regular diet consistency. Pt eating well at this time, 50 - 100% of meals. Pt denies any current issues at this time, states she enjoyed her salad at lunch.  Diet Order:  Heart Healthy  Meds: Scheduled Meds:   . antiseptic oral rinse  15 mL Mouth Rinse BID  . furosemide  40 mg Oral Daily  . heparin  5,000 Units Subcutaneous Q8H  . insulin aspart  0-20 Units Subcutaneous TID WC  . lisinopril  20 mg Oral Daily  . metoprolol succinate  100 mg Oral Daily  . pantoprazole  40 mg Oral Q1200  . potassium chloride  40 mEq Oral Once  . DISCONTD: antiseptic oral rinse  15 mL Mouth Rinse QID  . DISCONTD: metoprolol succinate  50 mg Oral Daily   Continuous Infusions:  PRN Meds:.alum & mag hydroxide-simeth, DISCONTD: metoCLOPramide (REGLAN) injection  Labs:  CMP     Component Value Date/Time   NA 137 11/12/2011 0435   K 3.7 11/12/2011 0435   CL 100 11/12/2011 0435   CO2 32 11/12/2011 0435   GLUCOSE 150* 11/12/2011 0435   BUN 24* 11/12/2011 0435   CREATININE 0.59 11/12/2011 0435   CALCIUM 9.7 11/12/2011 0435   PROT 7.6 11/11/2011 0630   ALBUMIN 3.3* 11/11/2011 0630   AST 22 11/11/2011 0630   ALT 27 11/11/2011 0630   ALKPHOS 182* 11/11/2011 0630   BILITOT 1.9* 11/11/2011 0630   GFRNONAA >90 11/12/2011 0435   GFRAA >90 11/12/2011 0435     Intake/Output Summary (Last 24 hours) at 11/12/11 1241 Last data filed at 11/12/11 1000  Gross per 24 hour  Intake    880 ml  Output   1270 ml  Net   -390 ml    Weight Status:  130.9 kg, wt trending down 2/2 fluid. BMI 51.1.  Re-estimated needs:  1866 - 2052 kcal, 80 - 95 grams protein  Nutrition Dx:  Inadequate oral intake r/t inability to eat AEB NPO status. Discontinue this goal. New Nutrition Dx: none  Goal:  Provide 60-70% of estimated needs,  and 100% protein needs per ASPEN guidelines for permissive underfeeding. Discontinue this goal. New Goal: Pt to consume >/= 75% of meals. Met.  Intervention:  1. Continue current interventions at this time 2. RD to follow nutrition care plan  Monitor:  Weights, labs, PO intake, I/O's  [facility] Pager #:  [phone]

## 2011-11-12 NOTE — Evaluation (Signed)
Physical Therapy Evaluation Patient Details Name: Whitney [name] MRN: [id number] DOB: [birth year] Today's Date: 11/12/2011  Problem List: There is no problem list on file for this patient.   Past Medical History:  Past Medical History  Diagnosis Date  . Diabetes mellitus   . Hypertension    Past Surgical History: History reviewed. No pertinent past surgical history.  PT Assessment/Plan/Recommendation PT Assessment Clinical Impression Statement: PT post cardiac arrest. Pt needed a little more time with bed mobility today but was able to do a majority of it herself with min assist and minimally cueing. Pt had decreased strength and needed 2+ help with sit to stand transfers. After pt completed stand pivot transfer, pt reported being too tired to walk. PT will assess ambulation next visit. Pt would benefit from skilled PT services to increase endurance, strength and to work on ambulation and transfers. 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  PT Recommendation/Assessment: Patient will need skilled PT in the acute care venue PT Problem List: Decreased strength;Decreased range of motion;Decreased activity tolerance;Decreased balance;Decreased mobility;Decreased coordination;Decreased knowledge of precautions;Decreased safety awareness;Decreased knowledge of use of DME;Pain Barriers to Discharge: Decreased caregiver support PT Therapy Diagnosis : Difficulty walking;Abnormality of gait;Generalized weakness;Acute pain PT Plan PT Frequency: Min 3X/week PT Treatment/Interventions: DME instruction;Gait training;Stair training;Functional mobility training;Therapeutic activities;Therapeutic exercise;Balance training;Patient/family education PT Recommendation Follow Up Recommendations: Skilled nursing facility Equipment Recommended: Defer to next venue PT Goals  Acute Rehab PT Goals PT Goal Formulation:  With patient Time For Goal Achievement: 7 days Pt will go Supine/Side to Sit: with modified independence;with rail;with HOB not 0 degrees (comment degree) (45 deg; has adjustable bed at home) PT Goal: Supine/Side to Sit - Progress: Goal set today Pt will go Sit to Supine/Side: with modified independence;with rail;with HOB not 0 degrees (comment degree) (45 deg) PT Goal: Sit to Supine/Side - Progress: Goal set today Pt will go Sit to Stand: with supervision;with upper extremity assist PT Goal: Sit to Stand - Progress: Goal set today Pt will go Stand to Sit: with supervision;with upper extremity assist PT Goal: Stand to Sit - Progress: Goal set today Pt will Transfer Bed to Chair/Chair to Bed: with supervision PT Transfer Goal: Bed to Chair/Chair to Bed - Progress: Goal set today Pt will Ambulate: 16 - 50 feet;with min assist;with least restrictive assistive device PT Goal: Ambulate - Progress: Goal set today Pt will Go Up / Down Stairs: 3-5 stairs;with min assist;with rail(s);with least restrictive assistive device PT Goal: Up/Down Stairs - Progress: Goal set today Pt will Perform Home Exercise Program: Independently PT Goal: Perform Home Exercise Program - Progress: Goal set today  PT Evaluation Precautions/Restrictions  Precautions Precautions: Fall Required Braces or Orthoses: No Restrictions Weight Bearing Restrictions: No Prior Functioning  Home Living Lives With: Family (takes care of mother) Receives Help From: Family (brothers/sister but not all the time) Type of Home: House Home Layout: One level Home Access: Stairs to enter;Ramped entrance (ramp in back) Entrance Stairs-Number of Steps: 4 Bathroom Shower/Tub: Tub/shower unit (but hasnt taken a shower in a while due to [employer]) Firefighter: Standard Bathroom Accessibility: Yes How Accessible: Accessible via walker Home Adaptive Equipment: Straight cane;Walker - rolling Prior Function Level of Independence:  Independent with basic ADLs;Independent with homemaking with ambulation;Independent with gait;Independent with transfers Driving: Yes Vocation: On disability Cognition Cognition Arousal/Alertness: Awake/alert Overall Cognitive Status: Appears within functional limits for tasks  assessed Orientation Level: Oriented X4 Sensation/Coordination Sensation Light Touch: Appears Intact Stereognosis: Not tested Hot/Cold: Not tested Proprioception: Not tested Coordination Gross Motor Movements are Fluid and Coordinated: Yes Fine Motor Movements are Fluid and Coordinated: Yes Extremity Assessment RUE Strength RUE Overall Strength: Deficits RUE Overall Strength Comments: overall 3/5 strength in RUE LUE Strength LUE Overall Strength: Deficits LUE Overall Strength Comments: overall weak in LUE; 3/5  RLE Strength RLE Overall Strength: Deficits RLE Overall Strength Comments: overall weak in RLE; 3+/5  LLE Strength LLE Overall Strength: Deficits LLE Overall Strength Comments: weak in LLE and painful during testing; 3/5 Mobility (including Balance) Bed Mobility Bed Mobility: Yes Supine to Sit: 4: Min assist Supine to Sit Details (indicate cue type and reason): needing a little help getting COM over BOS and cueing for hand placement; needed extended amount of time to do transfer Sitting - Scoot to Edge of Bed: 4: Min assist Sitting - Scoot to [place] of Bed Details (indicate cue type and reason): needed a little assistance scooting legs to EOBand for progression; was a slow progression Transfers Transfers: Yes Sit to Stand: 1: +2 Total assist;From bed;Without upper extremity assist Sit to Stand Details (indicate cue type and reason): pt = 60%; pt has decreased strength in LE and needed more assistance for sit to stand then for stand pivot; needed cueing to stand up straight and for hand placement on walker  Stand to Sit: 3: Mod assist;To chair/3-in-1;With armrests;With upper extremity assist Stand  to Sit Details: needed help controlling decent into chair and cueing for hand placement  Stand Pivot Transfers: 4: Min assist Stand Pivot Transfer Details (indicate cue type and reason): needed cueing for progression and hand placement; used a RW Ambulation/Gait Ambulation/Gait: No Stairs: No Wheelchair Mobility Wheelchair Mobility: No  Posture/Postural Control Posture/Postural Control: No significant limitations Balance Balance Assessed: Yes Static Sitting Balance Static Sitting - Balance Support: Bilateral upper extremity supported;Feet supported Static Sitting - Level of Assistance: 5: Stand by assistance Exercise  General Exercises - Lower Extremity Ankle Circles/Pumps: AROM;Strengthening;Both;5 reps;Seated Long Arc Quad: AROM;Strengthening;Both;10 reps;Seated Hip Flexion/Marching: AROM;Strengthening;Both;10 reps;Seated End of Session PT - End of Session Equipment Utilized During Treatment: Gait belt Activity Tolerance: Patient tolerated treatment well;Patient limited by fatigue Patient left: in chair;with call bell in reach Nurse Communication: Mobility status for transfers General Behavior During Session: [facility] for tasks performed Cognition: [facility] for tasks performed  Elvera Bicker 11/12/2011, 10:28 AM

## 2011-11-12 NOTE — Progress Notes (Signed)
Name: Whitney [name] MRN: [id number] DOB: 12-21-1955    LOS: 7  PCCM PROGRESS NOTE  History of Present Illness: 56 yo AAF with recent history of dyspnea, chest pain and left leg pain sent to [facility] ED to r/o PE.  Suffered PEA cardiac arrest in CT scanner, was intubated and successfully resuscitated.  Subsequently developed hypertensive emergency associated with pulmonary edema and severe hypoxia.  Improved with Lasix / Nitroglycerine.  Brought to 2900 for hypothermia protocol.  Lines / Drains: 2/4  OETT >>2/8 2/4  OGT >>2/8 2/4  Foley 2/5  R IJ TLC > 11/12/2011 (ordered)    Cultures: 2/5  Blood 2/5  Sputum 2/5 Ur str/leg ag >> neg  Results for orders placed during the hospital encounter of 11/05/11  MRSA PCR SCREENING     Status: Normal   Collection Time   11/06/11  2:52 AM      Component Value Range Status Comment   MRSA by PCR NEGATIVE  NEGATIVE  Final   CULTURE, RESPIRATORY     Status: Normal   Collection Time   11/06/11  4:10 AM      Component Value Range Status Comment   Specimen Description ENDOTRACHEAL ASPIRATE   Final    Special Requests NONE   Final    Gram Stain     Final    Value: FEW WBC PRESENT, PREDOMINANTLY PMN     NO SQUAMOUS EPITHELIAL CELLS SEEN     NO ORGANISMS SEEN   Culture Non-Pathogenic Oropharyngeal-type Flora Isolated.   Final    Report Status 11/08/2011 FINAL   Final   CULTURE, BLOOD (ROUTINE X 2)     Status: Normal (Preliminary result)   Collection Time   11/06/11  5:25 AM      Component Value Range Status Comment   Specimen Description BLOOD LEFT RADIAL A-LINE   Final    Special Requests BOTTLES DRAWN AEROBIC AND ANAEROBIC 10CC EA   Final    Culture  Setup Time [id number]   Final    Culture     Final    Value:        BLOOD CULTURE RECEIVED NO GROWTH TO DATE CULTURE WILL BE HELD FOR 5 DAYS BEFORE ISSUING A FINAL NEGATIVE REPORT   Report Status PENDING   Incomplete   CULTURE, BLOOD (ROUTINE X 2)     Status: Normal (Preliminary result)   Collection  Time   11/06/11  7:31 AM      Component Value Range Status Comment   Specimen Description BLOOD RIGHT HAND   Final    Special Requests BOTTLES DRAWN AEROBIC ONLY 3CC   Final    Culture  Setup Time [id number]   Final    Culture     Final    Value:        BLOOD CULTURE RECEIVED NO GROWTH TO DATE CULTURE WILL BE HELD FOR 5 DAYS BEFORE ISSUING A FINAL NEGATIVE REPORT   Report Status PENDING   Incomplete   CLOSTRIDIUM DIFFICILE BY PCR     Status: Normal   Collection Time   11/10/11  6:49 PM      Component Value Range Status Comment   C difficile by pcr NEGATIVE  NEGATIVE  Final      Antibiotics: 2/5  Ceftriaxone >> 2/7 2/5  Levofloxacin >>2/7  Anti-infectives     Start     Dose/Rate Route Frequency Ordered Stop   11/06/11 0500   Levofloxacin (LEVAQUIN) IVPB 750 mg  Status:  Discontinued        750 mg 100 mL/hr over 90 Minutes Intravenous Every 24 hours 11/06/11 0318 11/08/11 0923   11/06/11 0400   cefTRIAXone (ROCEPHIN) 2 g in dextrose 5 % 50 mL IVPB  Status:  Discontinued        2 g 100 mL/hr over 30 Minutes Intravenous Every 24 hours 11/06/11 0318 11/08/11 0923           Tests / Events: 2/4  Chest CTA>>>Bilateral airspace disease, bilateral effusions, no PE 2/4 2D TTE >> LVEF 30-35%, LVH, RV mildly dilated, RAE 2/8 - failed swallow, only meds and sips ok 2/9  - passed swallow. abd pain + 2/10 -  Off nitro gtt per cards. Po lasix started. Po ACe inhibitor started. ABd pain better   OVERNIGHT: Well. Not motivated - just lying in bed. Did not get out of bed to chair. Eating. Denies abdominal pain. Cards ok with transfer to tele   Vital Signs: Temp:  [97.7 F (36.5 C)-98.9 F (37.2 C)] 98.7 F (37.1 C) (02/11 0400) Pulse Rate:  [86-109] 95  (02/11 0800) Resp:  [18-33] 20  (02/11 0800) BP: (91-131)/(46-71) 121/65 mmHg (02/11 0800) SpO2:  [96 %-99 %] 96 % (02/11 0800) FiO2 (%):  [4 %] 4 % (02/11 0010) Weight:  [130.9 kg (288 lb 9.3 oz)] 130.9 kg (288 lb 9.3 oz) (02/11  0500) I/O last 3 completed shifts: In: 853 [P.O.:580; I.V.:273] Out: 1495 [Urine:1495]  Physical Examination: General:  Obese, deconditioned,  Neuro: RASS -1. GCS 15. CAM-ICU negative for delirium. Moves all 4s HEENT:  PERRL, pink conjunctivae, moist membranes Neck:  Cannot assess JVD Cardiovascular:  RRR, no M/R/G Lungs:  insp crackles in bases, scattered rhonchi Abdomen:  Obese, bowel sounds present Musculoskeletal:  Chronic stasis  / edema  Skin:  No rash   Labs and Imaging:    CBC    Component Value Date/Time   WBC 10.5 11/10/2011 0500   RBC 4.75 11/10/2011 0500   HGB 12.3 11/10/2011 0500   HCT 39.4 11/10/2011 0500   PLT 278 11/10/2011 0500   MCV 82.9 11/10/2011 0500   MCH 25.9* 11/10/2011 0500   MCHC 31.2 11/10/2011 0500   RDW 15.3 11/10/2011 0500   LYMPHSABS 2.2 11/05/2011 2101   MONOABS 0.8 11/05/2011 2101   EOSABS 0.0 11/05/2011 2101   BASOSABS 0.0 11/05/2011 2101    BMET    Component Value Date/Time   NA 137 11/12/2011 0435   K 3.7 11/12/2011 0435   CL 100 11/12/2011 0435   CO2 32 11/12/2011 0435   GLUCOSE 150* 11/12/2011 0435   BUN 24* 11/12/2011 0435   CREATININE 0.59 11/12/2011 0435   CALCIUM 9.7 11/12/2011 0435   GFRNONAA >90 11/12/2011 0435   GFRAA >90 11/12/2011 0435   Dg Abd Portable 1v  11/10/2011  *RADIOLOGY REPORT*  Clinical Data: Abdominal pain  PORTABLE ABDOMEN - 1 VIEW  Comparison: [facility] CT abdomen pelvis dated 07/04/2004  Findings: A portion of the colon, likely transverse colon, is gas- filled but otherwise unremarkable.  No disproportionate dilatation of the small bowel to suggest small bowel obstruction.  Degenerative changes of the visualized thoracolumbar spine.  IMPRESSION: No findings to suggest small bowel obstruction.  Original Report Authenticated By: Charline Bills, M.D.        ASSESSMENT AND PLAN  56 y/o female s/p respiratory arrest after coming [facility] ED for evaluation of dyspnea, chest pain and leg pain.  Had PEA arrest requiring CPR  for  10 minutes, hypertensive afterwards.    NEUROLOGIC A:  Has been following commands well since rewarming and extubation P: Monitor  PULMONARY  Lab 11/09/11 0310 11/08/11 0432 11/08/11 0206 11/07/11 0349 11/06/11 2356  PHART 7.423* 7.378 7.435* 7.436* 7.449*  PCO2ART 44.5 43.8 40.4 30.3* 32.7*  PO2ART 91.7 82.0 86.1 67.0* 60.0*  HCO3 28.5* 25.7* 26.6* 21.4 23.5  O2SAT 97.1 95.0 97.4 97.0 95.0   A:  Hypercarbic hypoxemic acute respiratory failure, most likely due to acute pulmonary edema.  Suspect baseline OSA / OHS.  S/p extubation 11/09/11. Doing well 11/10/11 and 11/11/11 and 11/12/11. REfusing CPAP 2/9 and 2/10 night P: --> > continue diuresis  -> sgtart nocturnal auto cpap empiric - > out of bed to chair, get PT consult -> needs opd sleep eval - > start IS 11/11/11  CARDIOVASCULAR  Lab 11/10/11 0930 11/06/11 1955 11/06/11 1345 11/06/11 0935 11/05/11 2102  TROPONINI -- 0.46* 0.60* 0.85* <0.30  LATICACIDVEN 1.1 -- -- -- --    Lab 11/11/11 0631 11/05/11 2307  PROBNP 118.6 1797.0*     A:  Status post PEA arrest due to hypertensive emergency with flash pulmonary edema.  Initially hypertensive post arrest.  Pressure has been labile on hypothermia protocol but renal function stable and oxygenation improving with diuresis. Still massively volume up by my exam; as of 11/10/11 and 11/11/11 as well. BNP 118 on 2/18. ECHO 11/06/11 - acute systolic CHF  P:  -->  Monitor BP , goal SBP <150 --> appreciate cardiology consult --> diurese per cards - started on PO --> PO meds started 11/11/11  RENAL  Lab 11/12/11 0435 11/11/11 0630 11/10/11 0930 11/10/11 0500 11/09/11 0450 11/08/11 0430 11/07/11 0745 11/07/11 0355  NA 137 141 143 144 146* -- -- --  K 3.7 3.9 -- -- -- -- -- --  CL 100 100 99 103 107 -- -- --  CO2 32 30 31 29 28  -- -- --  GLUCOSE 150* 151* 162* 162* 199* -- -- --  BUN 24* 24* 24* 22 25* -- -- --  CREATININE 0.59 0.60 0.66 0.57 0.73 -- -- --  CALCIUM 9.7 10.0 10.4 9.5 9.6 --  -- --  MG -- 2.2 -- -- -- 1.7 1.6 1.6  PHOS -- 3.6 -- -- -- -- -- 3.3   A:  Normal renal function.  Hypokalemia -K 3.7 11/12/2011  P: -monitor - replete K on 11/12/2011 - goal >4    GASTROINTESTINAL  Lab 11/11/11 0630 11/10/11 0930 11/06/11 1345 11/05/11 2101  AST 22 17 69* 45*  ALT 27 32 89* 59*  ALKPHOS 182* 151* 156* 150*  BILITOT 1.9* 1.8* 1.2 0.8  PROT 7.6 8.3 6.7 7.3  ALBUMIN 3.3* 3.5 2.9* 3.5   A:  Mildly elevated liver enzymes, possible shocked liver.  Improving. Having new abdominal pain and diarrhea 11/10/11. AXR and labd negative including c dif negative 11/10/11. Improved pain 11/11/11 - she thinks this is due to gas and given maalox. Pain resolved 11/12/11 P: --> prn reglan -> prn maalox - off opioids since 11/11/11  HEMATOLOGIC  Lab 11/10/11 0500 11/09/11 0450 11/08/11 0430 11/06/11 1536 11/06/11 1230 11/06/11 0244 11/05/11 2330 11/05/11 2101  HGB 12.3 11.7* 11.4* 13.9 -- -- 15.6* --  HCT 39.4 37.3 35.0* 41.0 -- -- 46.0 --  PLT 278 262 248 -- -- -- -- 299  INR -- -- -- -- 1.17 1.11 -- 1.01  APTT -- -- -- -- 28 29 -- 34   A:  No active issues P: -->  Monitor cbc   INFECTIOUS  Lab 11/10/11 0500 11/09/11 0450 11/08/11 0430 11/06/11 0329 11/05/11 2101  WBC 10.5 7.1 13.7* -- 14.9*  PROCALCITON -- -- -- 3.76 --   A:  Mildly elevated WBC.  No clear source of infection.  Pneumonia is unlikely possibility. Cardiogenic edema is more likely. P:   --> All antibiotics ended 11/08/11  ENDOCRINE  Lab 11/11/11 2119 11/11/11 1635 11/11/11 1151 11/11/11 0802 11/11/11 0359  GLUCAP 148* 159* 184* 159* 160*   A:  Diabetes / Hyperglycemia P: -->  Dc icu hypergycemia protocol; insulin gtt on 11/11/11 -> use non--pregnant ssi since 11/12/11  BEST PRACTICE / DISPOSITION -->  Tele transfer 11/12/11 ; wil ask triad to assume primary -->  Full code -->  -->  Heparin [place] for DVT Px -->  Protonix IV for GI Px -> PT.OT consult -> dc cvl 11/12/11 -needs opd sleep eval  .  Dr.  Kalman Shan, M.D., [facility].C.P Pulmonary and Critical Care Medicine Staff Physician [facility] Pager: [phone], If no answer or between  15:00h - 7:00h: call [phone]  11/12/2011 8:27 AM

## 2011-11-13 DIAGNOSIS — I5023 Acute on chronic systolic (congestive) heart failure: Secondary | ICD-10-CM | POA: Diagnosis present

## 2011-11-13 DIAGNOSIS — G4733 Obstructive sleep apnea (adult) (pediatric): Secondary | ICD-10-CM | POA: Diagnosis not present

## 2011-11-13 DIAGNOSIS — D72829 Elevated white blood cell count, unspecified: Secondary | ICD-10-CM

## 2011-11-13 DIAGNOSIS — IMO0002 Reserved for concepts with insufficient information to code with codable children: Secondary | ICD-10-CM | POA: Diagnosis present

## 2011-11-13 DIAGNOSIS — K7200 Acute and subacute hepatic failure without coma: Secondary | ICD-10-CM | POA: Diagnosis not present

## 2011-11-13 DIAGNOSIS — J9692 Respiratory failure, unspecified with hypercapnia: Secondary | ICD-10-CM | POA: Diagnosis present

## 2011-11-13 DIAGNOSIS — E1165 Type 2 diabetes mellitus with hyperglycemia: Secondary | ICD-10-CM | POA: Diagnosis present

## 2011-11-13 DIAGNOSIS — I469 Cardiac arrest, cause unspecified: Secondary | ICD-10-CM | POA: Diagnosis not present

## 2011-11-13 DIAGNOSIS — I5021 Acute systolic (congestive) heart failure: Secondary | ICD-10-CM | POA: Diagnosis not present

## 2011-11-13 DIAGNOSIS — J9600 Acute respiratory failure, unspecified whether with hypoxia or hypercapnia: Secondary | ICD-10-CM | POA: Diagnosis not present

## 2011-11-13 DIAGNOSIS — R5381 Other malaise: Secondary | ICD-10-CM | POA: Diagnosis not present

## 2011-11-13 LAB — GLUCOSE, CAPILLARY
Glucose-Capillary: 162 mg/dL — ABNORMAL HIGH (ref 70–99)
Glucose-Capillary: 175 mg/dL — ABNORMAL HIGH (ref 70–99)

## 2011-11-13 LAB — BASIC METABOLIC PANEL
CO2: 26 mEq/L (ref 19–32)
Calcium: 9.9 mg/dL (ref 8.4–10.5)
Creatinine, Ser: 0.54 mg/dL (ref 0.50–1.10)
Glucose, Bld: 165 mg/dL — ABNORMAL HIGH (ref 70–99)

## 2011-11-13 MED ORDER — ACETAMINOPHEN 325 MG PO TABS: 650.0000 mg | ORAL_TABLET | Freq: Once | ORAL | Status: DC

## 2011-11-13 MED ORDER — ACETAMINOPHEN 325 MG PO TABS
650.0000 mg | ORAL_TABLET | Freq: Once | ORAL | Status: AC
  Administered 2011-11-13: 650 mg via ORAL

## 2011-11-13 NOTE — Progress Notes (Signed)
OT Cancellation Note  Treatment cancelled today due to pt was fatigued s/p PT.  OT explained role of OT and pt agreed to work with OT next day.   Thanks,  Alba Cory 11/13/2011, 3:13 PM

## 2011-11-13 NOTE — Progress Notes (Signed)
Whitney [name],PT Acute Rehabilitation [phone] (pager)  

## 2011-11-13 NOTE — Progress Notes (Signed)
Subjective: Patient very pleasant morbidly obese African American female with a family member home with physical therapy just prior to my examination. The patient reports that she has been prescribed BiPAP at night uses it for years and she stopped it she was doing well due to the fact that she checked her oxygen saturations at night and they were up in the 90s. I spoke with the patient about the importance of using BiPAP and I will ask respiratory to give the patient titrated to BiPAP while here in the hospital. The patient was living at home with her mother prior to hospitalization. Her mother is unable to assist with ADLs and she recognizes that she will likely need short-term rehabilitation prior to returning home. Objective: Filed Vitals:   11/12/11 2100 11/13/11 0500 11/13/11 1026 11/13/11 1429  BP: 139/79 111/74 109/69 101/63  Pulse: 100 98 94 86  Temp: 98 F (36.7 C) 98.1 F (36.7 C)  98.2 F (36.8 C)  TempSrc: Oral Oral  Oral  Resp: 22 22  21   Height:      Weight:      SpO2: 99% 98%  96%   Weight change:   Intake/Output Summary (Last 24 hours) at 11/13/11 1934 Last data filed at 11/13/11 1700  Gross per 24 hour  Intake   1200 ml  Output    550 ml  Net    650 ml    General: Morbidly obese female Alert, awake, oriented x3, in no acute distress. Please note that the examination is limited secondary to the patient's body habitus HEENT: [place]/AT PEERL, EOMI Neck: Trachea midline,  no masses, no thyromegal,y no JVD, no carotid bruit. Central line in place at the neck. OROPHARYNX:  Moist, No exudate/ erythema/lesions.  Heart: Regular rate and rhythm, without murmurs, rubs, gallops.  Lungs: Clear to auscultation, no wheezing or rhonchi noted.   Abdomen: Obese, Soft, nontender, nondistended, positive bowel sounds, no masses no hepatosplenomegaly noted..  Neuro: No focal neurological deficits noted cranial nerves II through XII grossly intact.  Musculoskeletal: No warm swelling or  erythema around joints. Psychiatric: Patient alert and oriented x3, good insight and cognition, good recent to remote recall.      Lab Results:  Basename 11/13/11 0605 11/12/11 0435 11/11/11 0630  NA 139 137 --  K 3.8 3.7 --  CL 101 100 --  CO2 26 32 --  GLUCOSE 165* 150* --  BUN 24* 24* --  CREATININE 0.54 0.59 --  CALCIUM 9.9 9.7 --  MG -- -- 2.2  PHOS -- -- 3.6    Basename 11/11/11 0630  AST 22  ALT 27  ALKPHOS 182*  BILITOT 1.9*  PROT 7.6  ALBUMIN 3.3*   No results found for this basename: LIPASE:2,AMYLASE:2 in the last 72 hours No results found for this basename: WBC:2,NEUTROABS:2,HGB:2,HCT:2,MCV:2,PLT:2 in the last 72 hours No results found for this basename: CKTOTAL:3,CKMB:3,CKMBINDEX:3,TROPONINI:3 in the last 72 hours No components found with this basename: POCBNP:3 No results found for this basename: DDIMER:2 in the last 72 hours No results found for this basename: HGBA1C:2 in the last 72 hours No results found for this basename: CHOL:2,HDL:2,LDLCALC:2,TRIG:2,CHOLHDL:2,LDLDIRECT:2 in the last 72 hours No results found for this basename: TSH,T4TOTAL,FREET3,T3FREE,THYROIDAB in the last 72 hours No results found for this basename: VITAMINB12:2,FOLATE:2,FERRITIN:2,TIBC:2,IRON:2,RETICCTPCT:2 in the last 72 hours  Micro Results: Recent Results (from the past 240 hour(s))  MRSA PCR SCREENING     Status: Normal   Collection Time   11/06/11  2:52 AM  Component Value Range Status Comment   MRSA by PCR NEGATIVE  NEGATIVE  Final   CULTURE, RESPIRATORY     Status: Normal   Collection Time   11/06/11  4:10 AM      Component Value Range Status Comment   Specimen Description ENDOTRACHEAL ASPIRATE   Final    Special Requests NONE   Final    Gram Stain     Final    Value: FEW WBC PRESENT, PREDOMINANTLY PMN     NO SQUAMOUS EPITHELIAL CELLS SEEN     NO ORGANISMS SEEN   Culture Non-Pathogenic Oropharyngeal-type Flora Isolated.   Final    Report Status 11/08/2011 FINAL    Final   CULTURE, BLOOD (ROUTINE X 2)     Status: Normal   Collection Time   11/06/11  5:25 AM      Component Value Range Status Comment   Specimen Description BLOOD LEFT RADIAL A-LINE   Final    Special Requests BOTTLES DRAWN AEROBIC AND ANAEROBIC 10CC EA   Final    Culture  Setup Time [id number]   Final    Culture NO GROWTH 5 DAYS   Final    Report Status 11/12/2011 FINAL   Final   CULTURE, BLOOD (ROUTINE X 2)     Status: Normal   Collection Time   11/06/11  7:31 AM      Component Value Range Status Comment   Specimen Description BLOOD RIGHT HAND   Final    Special Requests BOTTLES DRAWN AEROBIC ONLY 3CC   Final    Culture  Setup Time [id number]   Final    Culture NO GROWTH 5 DAYS   Final    Report Status 11/12/2011 FINAL   Final   CLOSTRIDIUM DIFFICILE BY PCR     Status: Normal   Collection Time   11/10/11  6:49 PM      Component Value Range Status Comment   C difficile by pcr NEGATIVE  NEGATIVE  Final     Studies/Results: Ct Angio Chest W/cm &/or Wo Cm  11/05/2011  *RADIOLOGY REPORT*  Clinical Data: Chest pain, shortness of breath  CT ANGIOGRAPHY CHEST  Technique:  Multidetector CT imaging of the chest using the standard protocol during bolus administration of intravenous contrast. Multiplanar reconstructed images including MIPs were obtained and reviewed to evaluate the vascular anatomy.  Contrast: OMNIPAQUE IOHEXOL 300 MG/ML IV SOLN . After contrast administration the patient became   severely short of breath, dizzy, code called, CPR started, the patient transferred to the ED.  Comparison: 01/16/2005  Findings: There is fairly good contrast opacification of pulmonary artery branches.  Images are degraded by body habitus and patient breathing during the acquisition.  No convincing filling defects are identified to suggest acute PE.  There is minimal contrast opacification of the thoracic aorta.  There are small pleural effusions, right greater than left.  No pericardial effusion.  There is reflux of contrast from the right atrium into the hepatic veins suggesting right heart failure.  No hilar or mediastinal adenopathy.  Calcified left thyroid lesion is noted.  There are extensive ground-glass and airspace opacities throughout both lungs in a predominately perihilar distribution.  Flowing osteophytes in the mid and lower thoracic spine.  IMPRESSION:  1.  Extensive bilateral airspace edema or infiltrates. 2.  Small bilateral pleural effusions. 3.  No definite pulmonary emboli, with the exam limitations as above.  I telephoned the critical test results to Dr. Lynelle Doctor at the time of interpretation.  Original Report Authenticated By: Osa Craver, M.D.   Dg Chest Port 1 View  11/10/2011  *RADIOLOGY REPORT*  Clinical Data: Pulmonary infiltrates.  PORTABLE CHEST - 1 VIEW  Comparison: 11/09/2011.  Findings: The endotracheal tube and NG tubes have been removed. The right IJ catheter is stable.  Low lung volumes with bibasilar atelectasis and/or infiltrates.  No effusions or edema.  IMPRESSION:  1.  Removal of ET and NG tubes. 2.  Low lung volumes with bibasilar atelectasis and/or infiltrates.  Original Report Authenticated By: P. Loralie Champagne, M.D.   Dg Chest Port 1 View  11/09/2011  *RADIOLOGY REPORT*  Clinical Data: Assess endotracheal tube.  PORTABLE CHEST - 1 VIEW  Comparison: 11/08/2011.  Findings: Lower lung volumes.  Endotracheal tube tip 26 mm from the carina.  Right IJ central line is present with the tip at the cavoatrial junction.  Pulmonary aeration grossly appears unchanged, with bilateral basilar densities, likely atelectasis and airspace disease in combination.  The cardiopericardial silhouette unchanged.  The enteric tube has been retracted and is now within the upper thoracic esophagus.  IMPRESSION:  1.  Retraction of enteric tube with the tip in the upper to mid thoracic esophagus.  This should be advanced at least 30 cm. 2.  Lower lung volumes with otherwise unchanged  pulmonary aeration. 3.  Other support apparatus stable.  This is a call report.  Original Report Authenticated By: Andreas Newport, M.D.   Dg Chest Port 1 View  11/08/2011  *RADIOLOGY REPORT*  Clinical Data: Endotracheal tube  PORTABLE CHEST - 1 VIEW  Comparison: 11/07/2011; 11/06/2011; 11/05/2011  Findings: Examination is again degraded secondary to portable technique and patient motion artifact and body habitus.  Grossly unchanged cardiac silhouette and mediastinal contours grossly unchanged given slightly decreased lung volumes.   Interval retraction of endotracheal tube with tip overlying tracheal air column, approximate 3 cm superior to the carina.  Otherwise stable positioning of support apparatus.  Grossly unchanged layering small bilateral effusions and bibasilar heterogeneous opacities. No definite pneumothorax, though evaluation is slightly limited secondary to overlying chin.  Grossly unchanged bones.  IMPRESSION: 1. Interval retraction of endotracheal tube with tip now approximately 3 cm superior to the carina.  Otherwise, stable position of support apparatus.  No definite pneumothorax.  2.  Grossly unchanged findings of pulmonary edema, layering bilateral effusions and bibasilar opacities, atelectasis versus infiltrate.  Original Report Authenticated By: Waynard Reeds, M.D.   Dg Chest Port 1 View  11/07/2011  *RADIOLOGY REPORT*  Clinical Data: Evaluate endotracheal tube placement.  Shortness of breath.  PORTABLE CHEST - 1 VIEW  Comparison: Multiple priors, most recently 11/06/2011.  Findings: Previously noted left internal jugular central venous catheters been removed.  Endotracheal tube tip has been advanced, now only 6 mm above the carina.  There continues to be diffuse interstitial and airspace opacities throughout all aspects the lungs bilaterally.  Mild-moderate cardiomegaly is unchanged. The patient is rotated to the left on today's exam, resulting in distortion of the mediastinal contours  and reduced diagnostic sensitivity and specificity for mediastinal pathology.  .  IMPRESSION: 1.  Support apparatus, as above.  Please take note of low-lying endotracheal tube (6 mm above the carina), and consider withdrawal approximate 3-4 cm for more optimal placement. 2.  Otherwise, the radiographic appearance of chest is essentially unchanged, likely indicative of severe pulmonary edema (possibly with small bilateral pleural effusions).  This was made a call report.  Original Report Authenticated By: Florencia Reasons, M.D.  Dg Chest Port 1 View  11/06/2011  *RADIOLOGY REPORT*  Clinical Data: Central line placement  PORTABLE CHEST - 1 VIEW  Comparison: 11/06/2011  Findings: Interval right IJ catheter placement with tip projecting over the mid SVC.  No pneumothorax.  Otherwise no interval change.  IMPRESSION: Interval right IJ catheter placement, tip projecting over the mid SVC.  No pneumothorax.  Original Report Authenticated By: Waneta Martins, M.D.   Dg Chest Port 1 View  11/06/2011  *RADIOLOGY REPORT*  Clinical Data: Central line placement  PORTABLE CHEST - 1 VIEW  Comparison: 11/06/2011  Findings: Interval retraction of the left IJ catheter, with tip now projecting over the left brachiocephalic vein.  Otherwise, no significant interval change.  Endotracheal tube tip is positioned 1.5 cm proximal the carina.  NG tube descends into the abdomen, tip not visualized.  Cardiomegaly.  Central vascular congestion. Bilateral airspace opacities and likely layering pleural effusions. No pneumothorax.  No acute osseous abnormality.  IMPRESSION: Interval left IJ catheter retraction with tip now projecting over the left brachiocephalic vein.  Endotracheal tube tip positioned 1.5 cm proximal to the carina.  Cardiomegaly.  Bilateral airspace opacities/advanced edema pattern and probable effusions, similar to prior.  Original Report Authenticated By: Waneta Martins, M.D.   Dg Chest Port 1 View  11/06/2011   *RADIOLOGY REPORT*  Clinical Data: Central line placement  PORTABLE CHEST - 1 VIEW  Comparison: 11/05/2011  Findings: Interval left IJ catheter placement, with catheter crosses midline and tip projecting over the right brachiocephalic vein.  Endotracheal tube tip is positioned 1 cm proximal to the carina.  Cardiomegaly.  Bilateral opacities and effusions.  No pneumothorax. NG tube descends into the abdomen, tip not visualized.  IMPRESSION: Left IJ catheter crosses midline, with tip positioned over the right brachiocephalic vein.  Endotracheal tube tip positioned 1 cm proximal to the carina.  Bilateral airspace opacities and effusions.  Original Report Authenticated By: Waneta Martins, M.D.   Dg Chest Port 1 View  11/05/2011  *RADIOLOGY REPORT*  Clinical Data: CPR, intubated  PORTABLE CHEST - 1 VIEW  Comparison: Early exam the same day  Findings: An endotracheal tube has been placed 2 cm into the right mainstem bronchus.  There are extensive bilateral airspace opacities with air bronchograms seen centrally.  Cannot exclude pleural effusions.  Heart size difficult to assess due to adjacent opacities.  Regional bones unremarkable.  IMPRESSION:  1.  Low position of the endotracheal tube into the right mainstem bronchus, should be retracted. I telephoned the critical test results to Dr. Lynelle Doctor at the time of interpretation. 2.  Extensive bilateral airspace edema or infiltrates.  Original Report Authenticated By: Osa Craver, M.D.   Dg Abd Portable 1v  11/10/2011  *RADIOLOGY REPORT*  Clinical Data: Abdominal pain  PORTABLE ABDOMEN - 1 VIEW  Comparison: [facility] CT abdomen pelvis dated 07/04/2004  Findings: A portion of the colon, likely transverse colon, is gas- filled but otherwise unremarkable.  No disproportionate dilatation of the small bowel to suggest small bowel obstruction.  Degenerative changes of the visualized thoracolumbar spine.  IMPRESSION: No findings to suggest small bowel  obstruction.  Original Report Authenticated By: Charline Bills, M.D.    Medications: I have reviewed the patient's current medications. Scheduled Meds:   . acetaminophen  650 mg Oral Once  . antiseptic oral rinse  15 mL Mouth Rinse BID  . aspirin  81 mg Oral Daily  . furosemide  40 mg Oral Daily  . heparin  5,000 Units Subcutaneous Q8H  . insulin aspart  0-20 Units Subcutaneous TID WC  . lisinopril  20 mg Oral Daily  . metoprolol succinate  100 mg Oral Daily  . oxybutynin  5 mg Oral Daily  . pantoprazole  40 mg Oral Q1200  . simvastatin  10 mg Oral QHS  . DISCONTD: acetaminophen  650 mg Oral Once   Continuous Infusions:  PRN Meds:.alum & mag hydroxide-simeth Assessment/Plan: Patient Active Hospital Problem List: Cardiac arrest (11/13/2011)   Assessment: Patient status post hypothermic therapy appears to have recovered well from this. The patient having occasional pain in the chest area CPR was performed. This is relieved with Tylenol   Respiratory failure with hypercapnia (11/13/2011)   Assessment: Patient has chronic hypercapnic respiratory failure which is likely in some part due to obesity hypoventilation syndrome and also obstructive sleep apnea. I was asked to do therapy to the psychiatry of the BiPAP the patient. The patient will need a polysomnogram at the time of discharge and re\re titration of her CPAP.    Acute on chronic systolic congestive heart failure (11/13/2011)   Assessment: This is being managed by cardiology. He has been the recommendation to change her diuretics.patient continues on lisinopril.     Shock liver (11/13/2011)   Assessment: Patient's liver function appears to be stable at this time. I suspect she'll have some degree of transaminitis due to some degree of pulmonary hypertension    Leukocytosis (11/13/2011)   Assessment: This is felt to be noninfectious. Patient presently not on any antibiotics     Diabetes mellitus type 2, uncontrolled (11/13/2011)    Assessment: Blood sugars in the hospital relatively well controlled. However will obtain a hemoglobin A1c to establish degree of long-term control.    Patient medically stable for discharge.   LOS: 8 days

## 2011-11-13 NOTE — Progress Notes (Signed)
Subjective:  No complaints. In chair, no SOB, noCP.   Objective:  Vital Signs in the last 24 hours: Temp:  [98 F (36.7 C)-98.4 F (36.9 C)] 98.1 F (36.7 C) (02/12 0500) Pulse Rate:  [87-100] 94  (02/12 1026) Resp:  [21-31] 22  (02/12 0500) BP: (97-139)/(42-84) 109/69 mmHg (02/12 1026) SpO2:  [97 %-99 %] 98 % (02/12 0500)  Intake/Output from previous day: 02/11 0701 - 02/12 0700 In: 1200 [P.O.:1200] Out: 775 [Urine:775]   Physical Exam: General: Well developed, well nourished, in no acute distress. Head:  Normocephalic and atraumatic. Lungs: Clear to auscultation and percussion. Heart: Normal/ tachy S1 and S2.  No murmur, rubs or gallops.  Abdomen: soft, non-tender, positive bowel sounds. Obese Extremities: Dry skin, much improved edema. Neurologic: Alert and oriented x 3.    Lab Results: No results found for this basename: WBC:2,HGB:2,PLT:2 in the last 72 hours  Basename 11/13/11 0605 11/12/11 0435  NA 139 137  K 3.8 3.7  CL 101 100  CO2 26 32  GLUCOSE 165* 150*  BUN 24* 24*  CREATININE 0.54 0.59   No results found for this basename: TROPONINI:2,CK,MB:2 in the last 72 hours Hepatic Function Panel  Basename 11/11/11 0630  PROT 7.6  ALBUMIN 3.3*  AST 22  ALT 27  ALKPHOS 182*  BILITOT 1.9*  BILIDIR --  IBILI --   Telemetry: No VT Personally viewed.   Assessment/Plan:  Active Problems:  Cardiac arrest  Respiratory failure with hypercapnia  Acute on chronic systolic congestive heart failure  Shock liver  Leukocytosis  Diabetes mellitus type 2, uncontrolled  -Lasix QD 40.  -Toprol 100 -Lisinopril 20  Improved. Mobilize.   When she leaves, will have one week f/u.   Shahzad Thomann 11/13/2011, 10:35 AM

## 2011-11-13 NOTE — Progress Notes (Signed)
Physical Therapy Treatment Patient Details Name: Whitney [name] MRN: [id number] DOB: [birth year] Today's Date: 11/13/2011  PT Assessment/Plan  PT - Assessment/Plan Comments on Treatment Session: Pt s/p cardiac arrest. Pt was feeling much better today. She was able to walk 80 feet with one sitting rest break. The first walk the pt was not on supplement O2 and her SPO2 went down to 82. Put pt on O2 for second walk and her stats stayed up. Pt reports that getting up and walking feels good but she is very tired. Pt overall tolerated treatment well.  PT Plan: Discharge plan remains appropriate PT Frequency: Min 3X/week Follow Up Recommendations: Skilled nursing facility Equipment Recommended: Defer to next venue PT Goals  Acute Rehab PT Goals PT Goal: Sit to Stand - Progress: Progressing toward goal PT Goal: Stand to Sit - Progress: Progressing toward goal Pt will Ambulate: >150 feet;with supervision;with least restrictive assistive device PT Goal: Ambulate - Progress: Updated due to goal met PT Goal: Perform Home Exercise Program - Progress: Progressing toward goal  PT Treatment Precautions/Restrictions  Precautions Precautions: Fall Required Braces or Orthoses: No Restrictions Weight Bearing Restrictions: No Mobility (including Balance) Bed Mobility Bed Mobility: No Supine to Sit: Not tested (comment) Sitting - Scoot to Edge of Bed: Not tested (comment) Transfers Transfers: Yes Sit to Stand: 3: Mod assist;From chair/3-in-1 Sit to Stand Details (indicate cue type and reason): pt was able to rock back and forth and stand with mod assist; she needed cueing on progression of exercise and hand placement; x2  Stand to Sit: 4: Min assist;To chair/3-in-1 Stand to Sit Details: pt needing a little assistance with controlling decent into chair; x2  Stand Pivot Transfers: Not tested (comment) Ambulation/Gait Ambulation/Gait: Yes Ambulation/Gait Assistance: 4: Min assist Ambulation/Gait  Assistance Details (indicate cue type and reason): needing cueing on progression and hand placement; pt was able to walk 50 feet then take a rest break and walk another 30 feet  Ambulation Distance (Feet): 80 Feet Assistive device: Rolling walker Gait Pattern: Step-through pattern;Decreased stride length;Decreased weight shift to left;Decreased weight shift to right;Trunk flexed Stairs: No Wheelchair Mobility Wheelchair Mobility: No  Posture/Postural Control Posture/Postural Control: No significant limitations Balance Balance Assessed: No End of Session PT - End of Session Equipment Utilized During Treatment: Gait belt Activity Tolerance: Patient tolerated treatment well Patient left: in chair;with call bell in reach Nurse Communication: Mobility status for transfers;Mobility status for ambulation General Behavior During Session: [facility] for tasks performed Cognition: [facility] for tasks performed  Elvera Bicker 11/13/2011, 2:13 PM

## 2011-11-13 NOTE — Plan of Care (Signed)
Problem: Phase III Progression Outcomes Goal: Other Phase III Outcomes/Goals Outcome: Progressing Gave pt heart failure packet; went over the green, yellow, red s/s with pt

## 2011-11-13 NOTE — Progress Notes (Signed)
Pt refuses Cpap@[facility]  for now. Alerted to page Resp care if she changes her mind.

## 2011-11-14 DIAGNOSIS — I5021 Acute systolic (congestive) heart failure: Secondary | ICD-10-CM | POA: Diagnosis not present

## 2011-11-14 DIAGNOSIS — I469 Cardiac arrest, cause unspecified: Secondary | ICD-10-CM | POA: Diagnosis not present

## 2011-11-14 DIAGNOSIS — G4733 Obstructive sleep apnea (adult) (pediatric): Secondary | ICD-10-CM | POA: Diagnosis not present

## 2011-11-14 DIAGNOSIS — I5023 Acute on chronic systolic (congestive) heart failure: Secondary | ICD-10-CM | POA: Diagnosis not present

## 2011-11-14 DIAGNOSIS — J9600 Acute respiratory failure, unspecified whether with hypoxia or hypercapnia: Secondary | ICD-10-CM | POA: Diagnosis not present

## 2011-11-14 DIAGNOSIS — R5381 Other malaise: Secondary | ICD-10-CM | POA: Diagnosis not present

## 2011-11-14 LAB — BASIC METABOLIC PANEL
CO2: 23 mEq/L (ref 19–32)
Calcium: 9.9 mg/dL (ref 8.4–10.5)
Chloride: 102 mEq/L (ref 96–112)
Glucose, Bld: 177 mg/dL — ABNORMAL HIGH (ref 70–99)
Sodium: 138 mEq/L (ref 135–145)

## 2011-11-14 LAB — GLUCOSE, CAPILLARY
Glucose-Capillary: 166 mg/dL — ABNORMAL HIGH (ref 70–99)
Glucose-Capillary: 178 mg/dL — ABNORMAL HIGH (ref 70–99)
Glucose-Capillary: 136 mg/dL — ABNORMAL HIGH (ref 70–99)

## 2011-11-14 MED ORDER — TRAMADOL HCL 50 MG PO TABS
50.0000 mg | ORAL_TABLET | Freq: Four times a day (QID) | ORAL | Status: DC | PRN
  Administered 2011-11-14: 50 mg via ORAL
  Filled 2011-11-14: qty 1

## 2011-11-14 MED ORDER — FUROSEMIDE 40 MG PO TABS: 40.0000 mg | ORAL_TABLET | Freq: Every day | ORAL | Status: DC

## 2011-11-14 MED ORDER — PANTOPRAZOLE SODIUM 40 MG PO TBEC: 40.0000 mg | DELAYED_RELEASE_TABLET | Freq: Every day | ORAL | Status: DC

## 2011-11-14 MED ORDER — METOPROLOL SUCCINATE ER 100 MG PO TB24: 100.0000 mg | ORAL_TABLET | Freq: Every day | ORAL | Status: DC

## 2011-11-14 NOTE — Evaluation (Signed)
Occupational Therapy Evaluation Patient Details Name: Whitney [name] MRN: [id number] DOB: [birth year] Today's Date: 11/14/2011  Problem List:  Patient Active Problem List  Diagnoses  . Cardiac arrest  . Respiratory failure with hypercapnia  . Acute on chronic systolic congestive heart failure  . Shock liver  . Leukocytosis  . Diabetes mellitus type 2, uncontrolled    Past Medical History:  Past Medical History  Diagnosis Date  . Diabetes mellitus   . Hypertension    Past Surgical History: History reviewed. No pertinent past surgical history.  OT Assessment/Plan/Recommendation OT Assessment Clinical Impression Statement: Pt is a 56 y.o. woman admitted after cardiac arrest.  Pt was independent PTA and the caregiver of her mother.  Pt is now dependent in all aspects of bathing and dressing and able to perform grooming and eating from a seated position.  Pt has low endurance.  Will plan to follow acutely.  Recommend rehab in SNF upon d/c, pt in agreement. OT Recommendation/Assessment: Patient will need skilled OT in the acute care venue OT Problem List: Decreased strength;Impaired balance (sitting and/or standing);Decreased activity tolerance;Decreased knowledge of use of DME or AE;Obesity;Impaired UE functional use;Cardiopulmonary status limiting activity;Pain OT Therapy Diagnosis : Generalized weakness;Acute pain OT Plan OT Frequency: Min 1X/week OT Treatment/Interventions: Self-care/ADL training;Therapeutic activities;Therapeutic exercise;Patient/family education;DME and/or AE instruction OT Recommendation Follow Up Recommendations: Skilled nursing facility Equipment Recommended: Defer to next venue Individuals Consulted Consulted and Agree with Results and Recommendations: Patient OT Goals Acute Rehab OT Goals OT Goal Formulation: With patient Time For Goal Achievement: 2 weeks ADL Goals Pt Will Perform Grooming: Standing at sink;with min assist ADL Goal: Grooming -  Progress: Goal set today Pt Will Perform Upper Body Bathing: with min assist;Sitting, chair ADL Goal: Upper Body Bathing - Progress: Goal set today Pt Will Perform Lower Body Bathing: with mod assist;Sit to stand from chair;Sitting, chair;with adaptive equipment ADL Goal: Lower Body Bathing - Progress: Goal set today Pt Will Perform Upper Body Dressing: with supervision;Sitting, chair ADL Goal: Upper Body Dressing - Progress: Goal set today Pt Will Perform Lower Body Dressing: with min assist;Sitting, chair;Sit to stand from chair;with adaptive equipment ADL Goal: Lower Body Dressing - Progress: Goal set today Pt Will Transfer to Toilet: with min assist;Regular height toilet;Grab bars ADL Goal: Toilet Transfer - Progress: Goal set today Pt Will Perform Toileting - Hygiene: with adaptive equipment;Sit to stand from 3-in-1/toilet;with min assist ADL Goal: Toileting - Hygiene - Progress: Goal set today Arm Goals Pt Will Perform AROM: Independently;10 reps;Bilateral upper extremities (x 2, 3 times a day, AROM all areas to increase endurance) Arm Goal: AROM - Progress: Goal set today  OT Evaluation Precautions/Restrictions  Precautions Precautions: Fall Required Braces or Orthoses: No Restrictions Weight Bearing Restrictions: No Prior Functioning Home Living Lives With: Other (Comment) (elderly mother) Type of Home: House Home Layout: One level Home Access: Stairs to enter;Ramped entrance Entrance Stairs-Number of Steps: 4 Bathroom Shower/Tub: Engineer, manufacturing systems: Standard Bathroom Accessibility: Yes How Accessible: Accessible via walker Home Adaptive Equipment: Walker - rolling;Straight cane;Tub transfer bench;Other (comment) (electric bed, lift chair) Additional Comments: Pt was not able to shower PTA. Prior Function Level of Independence: Independent with basic ADLs;Independent with homemaking with ambulation;Independent with gait;Independent with transfers Driving:  Yes Vocation: On disability ADL ADL Eating/Feeding: Simulated;Independent Where Assessed - Eating/Feeding: Chair Grooming: Performed;Wash/dry hands;Wash/dry face;Set up Where Assessed - Grooming: Sitting, chair Upper Body Bathing: Simulated;Moderate assistance Where Assessed - Upper Body Bathing: Sitting, chair Lower Body Bathing: Performed;+1 Total assistance Where  Assessed - Lower Body Bathing: Sitting, chair;Sit to stand from chair Upper Body Dressing: Simulated;Moderate assistance Where Assessed - Upper Body Dressing: Sitting, chair Lower Body Dressing: Performed;+1 Total assistance Where Assessed - Lower Body Dressing: Sitting, chair;Sit to stand from chair Toileting - Hygiene: Simulated;+1 Total assistance Equipment Used: Rolling walker Ambulation Related to ADLs: Pt is ambulating with 2 people to and from bathroom with min assist and RW. ADL Comments: Pt is limited by difficulty with sit to stand (was an issue at baseline), low endurance, decreased balance and obesity. Vision/Perception  Vision - History Baseline Vision: Wears glasses only for reading Patient Visual Report: No change from baseline Cognition Cognition Arousal/Alertness: Awake/alert Overall Cognitive Status: Appears within functional limits for tasks assessed Orientation Level: Oriented X4 Sensation/Coordination Sensation Light Touch: Appears Intact Hot/Cold: Appears Intact Proprioception: Appears Intact Coordination Gross Motor Movements are Fluid and Coordinated: Yes Fine Motor Movements are Fluid and Coordinated: Yes Extremity Assessment RUE Assessment RUE Assessment: Exceptions to [facility] RUE Strength RUE Overall Strength: Deficits;Other (Comment) (due to endurance) LUE Assessment LUE Assessment: Exceptions to [facility] LUE Strength LUE Overall Strength: Deficits;Other (Comment) (due to low endurance) Mobility  Bed Mobility Bed Mobility: No Transfers Transfers: Yes Sit to Stand: 2: Max assist;Other  (comment);From chair/3-in-1;With armrests (second person for safety, uses momentum) Stand to Sit: 4: Min assist;To chair/3-in-1 End of Session OT - End of Session Equipment Utilized During Treatment: Gait belt Activity Tolerance: Patient limited by fatigue Patient left: in chair;with call bell in reach General Behavior During Session: [facility] for tasks performed Cognition: [facility] for tasks performed   Evern Bio 11/14/2011, 11:24 AM [phone]

## 2011-11-14 NOTE — Progress Notes (Signed)
Pt. Refused cpap. Pt. States she just wants to wear her oxygen for now.

## 2011-11-14 NOTE — Progress Notes (Signed)
Subjective:  In Chair. No SOB. Difficult to get   Objective:  Vital Signs in the last 24 hours: Temp:  [97.5 F (36.4 C)-98.2 F (36.8 C)] 97.9 F (36.6 C) (02/13 0815) Pulse Rate:  [80-94] 94  (02/13 1005) Resp:  [20-22] 22  (02/13 0815) BP: (100-120)/(62-71) 106/62 mmHg (02/13 1005) SpO2:  [96 %-100 %] 100 % (02/13 0815) Weight:  [139.209 kg (306 lb 14.4 oz)] 139.209 kg (306 lb 14.4 oz) (02/13 0500)  Intake/Output from previous day: 02/12 0701 - 02/13 0700 In: 840 [P.O.:840] Out: 400 [Urine:400]   Physical Exam: General: Well developed, well nourished, in no acute distress.  Head: Normocephalic and atraumatic.  Lungs: Clear to auscultation and percussion.  Heart: Normal S1 and S2. No murmur, rubs or gallops.  Abdomen: soft, non-tender, positive bowel sounds. Obese  Extremities: Dry skin, much improved edema.  Neurologic: Alert and oriented x 3.  Lab Results: No results found for this basename: WBC:2,HGB:2,PLT:2 in the last 72 hours  Basename 11/14/11 0700 11/13/11 0605  NA 138 139  K 4.2 3.8  CL 102 101  CO2 23 26  GLUCOSE 177* 165*  BUN 21 24*  CREATININE 0.53 0.54    Telemetry: No VT Personally viewed.   Assessment/Plan:  Active Problems:  Cardiac arrest  Respiratory failure with hypercapnia  Acute on chronic systolic congestive heart failure  Shock liver  Leukocytosis  Diabetes mellitus type 2, uncontrolled  - Doing well -Continue current meds -OK from my standpoint for DC in am if rehab available -Will get follow up in one week in clinic. -In 2 months will reassess EF. If still low..cath.     Tonette Koehne 11/14/2011, 12:39 PM

## 2011-11-14 NOTE — Progress Notes (Signed)
Utilization Review Completed.England Greb T2/13/2013   

## 2011-11-14 NOTE — Discharge Summary (Signed)
Physician Discharge Summary  Patient ID: Whitney [name] MRN: [id number] DOB/AGE: 58-Jul-1957 58 y.o.  Admit date: 11/05/2011 Discharge date: 11/14/2011  Primary Care Physician:  Hollice Espy, MD, MD   Discharge Diagnoses:    Active Problems:  Cardiac arrest  Respiratory failure with hypercapnia  Acute on chronic systolic congestive heart failure  Shock liver  Leukocytosis  Diabetes mellitus type 2, uncontrolled    Medication List  As of 11/14/2011  1:56 PM   TAKE these medications         acetaminophen 500 MG tablet   Commonly known as: TYLENOL   Take 500 mg by mouth every 6 (six) hours as needed. For pain      aspirin 325 MG EC tablet   Take 325 mg by mouth every 6 (six) hours as needed. For pain      furosemide 40 MG tablet   Commonly known as: LASIX   Take 1 tablet (40 mg total) by mouth daily.      lisinopril 20 MG tablet   Commonly known as: PRINIVIL,ZESTRIL   Take 20 mg by mouth daily.      metoprolol succinate 100 MG 24 hr tablet   Commonly known as: TOPROL-XL   Take 1 tablet (100 mg total) by mouth daily. Take with or immediately following a meal.      omeprazole 40 MG capsule   Commonly known as: PRILOSEC   Take 40 mg by mouth 2 (two) times daily.      oxybutynin 5 MG 24 hr tablet   Commonly known as: DITROPAN-XL   Take 5 mg by mouth daily.      pantoprazole 40 MG tablet   Commonly known as: PROTONIX   Take 1 tablet (40 mg total) by mouth daily at 12 noon.      simvastatin 10 MG tablet   Commonly known as: ZOCOR   Take 10 mg by mouth at bedtime.      traMADol 50 MG tablet   Commonly known as: ULTRAM   Take 50 mg by mouth every 6 (six) hours as needed. For pain             Disposition and Follow-up:  Patient will be discharged to skilled nursing facility today in stable and improved condition.  Consults:  cardiology Dr. Anne Fu   Significant Diagnostic Studies:  Ct Angio Chest W/cm &/or Wo Cm  11/05/2011  *RADIOLOGY REPORT*  Clinical  Data: Chest pain, shortness of breath  CT ANGIOGRAPHY CHEST  Technique:  Multidetector CT imaging of the chest using the standard protocol during bolus administration of intravenous contrast. Multiplanar reconstructed images including MIPs were obtained and reviewed to evaluate the vascular anatomy.  Contrast: OMNIPAQUE IOHEXOL 300 MG/ML IV SOLN . After contrast administration the patient became   severely short of breath, dizzy, code called, CPR started, the patient transferred to the ED.  Comparison: 01/16/2005  Findings: There is fairly good contrast opacification of pulmonary artery branches.  Images are degraded by body habitus and patient breathing during the acquisition.  No convincing filling defects are identified to suggest acute PE.  There is minimal contrast opacification of the thoracic aorta.  There are small pleural effusions, right greater than left.  No pericardial effusion. There is reflux of contrast from the right atrium into the hepatic veins suggesting right heart failure.  No hilar or mediastinal adenopathy.  Calcified left thyroid lesion is noted.  There are extensive ground-glass and airspace opacities throughout both lungs in a  predominately perihilar distribution.  Flowing osteophytes in the mid and lower thoracic spine.  IMPRESSION:  1.  Extensive bilateral airspace edema or infiltrates. 2.  Small bilateral pleural effusions. 3.  No definite pulmonary emboli, with the exam limitations as above.  I telephoned the critical test results to Dr. Lynelle Doctor at the time of interpretation.  Original Report Authenticated By: Osa Craver, M.D.   Dg Chest Port 1 View  11/06/2011  *RADIOLOGY REPORT*  Clinical Data: Central line placement  PORTABLE CHEST - 1 VIEW  Comparison: 11/06/2011  Findings: Interval right IJ catheter placement with tip projecting over the mid SVC.  No pneumothorax.  Otherwise no interval change.  IMPRESSION: Interval right IJ catheter placement, tip projecting over  the mid SVC.  No pneumothorax.  Original Report Authenticated By: Waneta Martins, M.D.   Dg Chest Port 1 View  11/06/2011  *RADIOLOGY REPORT*  Clinical Data: Central line placement  PORTABLE CHEST - 1 VIEW  Comparison: 11/06/2011  Findings: Interval retraction of the left IJ catheter, with tip now projecting over the left brachiocephalic vein.  Otherwise, no significant interval change.  Endotracheal tube tip is positioned 1.5 cm proximal the carina.  NG tube descends into the abdomen, tip not visualized.  Cardiomegaly.  Central vascular congestion. Bilateral airspace opacities and likely layering pleural effusions. No pneumothorax.  No acute osseous abnormality.  IMPRESSION: Interval left IJ catheter retraction with tip now projecting over the left brachiocephalic vein.  Endotracheal tube tip positioned 1.5 cm proximal to the carina.  Cardiomegaly.  Bilateral airspace opacities/advanced edema pattern and probable effusions, similar to prior.  Original Report Authenticated By: Waneta Martins, M.D.   Dg Chest Port 1 View  11/06/2011  *RADIOLOGY REPORT*  Clinical Data: Central line placement  PORTABLE CHEST - 1 VIEW  Comparison: 11/05/2011  Findings: Interval left IJ catheter placement, with catheter crosses midline and tip projecting over the right brachiocephalic vein.  Endotracheal tube tip is positioned 1 cm proximal to the carina.  Cardiomegaly.  Bilateral opacities and effusions.  No pneumothorax. NG tube descends into the abdomen, tip not visualized.  IMPRESSION: Left IJ catheter crosses midline, with tip positioned over the right brachiocephalic vein.  Endotracheal tube tip positioned 1 cm proximal to the carina.  Bilateral airspace opacities and effusions.  Original Report Authenticated By: Waneta Martins, M.D.   Dg Chest Port 1 View  11/05/2011  *RADIOLOGY REPORT*  Clinical Data: CPR, intubated  PORTABLE CHEST - 1 VIEW  Comparison: Early exam the same day  Findings: An endotracheal tube has  been placed 2 cm into the right mainstem bronchus.  There are extensive bilateral airspace opacities with air bronchograms seen centrally.  Cannot exclude pleural effusions.  Heart size difficult to assess due to adjacent opacities.  Regional bones unremarkable.  IMPRESSION:  1.  Low position of the endotracheal tube into the right mainstem bronchus, should be retracted. I telephoned the critical test results to Dr. Lynelle Doctor at the time of interpretation. 2.  Extensive bilateral airspace edema or infiltrates.  Original Report Authenticated By: Thora Lance III, M.D.    Brief H and P: For complete details please refer to admission H and P, but in brief 56 yo AAF with recent history of dyspnea, chest pain and left leg pain sent to [facility] ED to r/o PE. Suffered PEA cardiac arrest in CT scanner, was intubated and successfully resuscitated. Subsequently developed hypertensive emergency associated with pulmonary edema and severe hypoxia. Improved with Lasix / Nitroglycerine.  Brought to 2900 for hypothermia protocol. Subsequently transferred to the hospitalist service for further evaluation and management.     Hospital Course:  Active Problems:  Cardiac arrest  Respiratory failure with hypercapnia  Acute on chronic systolic congestive heart failure  Shock liver  Leukocytosis  Diabetes mellitus type 2, uncontrolled   #1 cardiac arrest: Patient was on the hypothermia protocol. Appears to have recovered well.  #2 chronic hypercarbic respiratory failure: Multifactorial, likely secondary to sleep apnea, obesity hypoventilation syndrome. Has been instructed on importance of compliance with BiPAP. At some point after discharge will require a sleep study to continue her BiPAP titration.  #3 systolic CHF: 2-D echo shows an ejection fraction of 30-35%. Management as per cardiology. Is not short of breath and does not appear volume overloaded on exam.  #4 transaminitis: Likely secondary to shock liver.  Stabilizing.  #5 pulmonary hypertension: Secondary to morbid obesity and sleep apnea. Stable.  #6 type 2 diabetes: Relatively well controlled.  #7 disposition: Patient medically ready for discharge to skilled nursing facility today pending bed availability.   Time spent on Discharge: Greater than 30 minutes.  SignedChaya Jan [facility] Pager: [phone] 11/14/2011, 1:56 PM

## 2011-11-15 ENCOUNTER — Inpatient Hospital Stay
Admission: AD | Admit: 2011-11-15 | Discharge: 2011-11-16 | Disposition: A | Discharge status: Skilled Nursing Facility | Payer: Self-pay | Source: Ambulatory Visit | Attending: Internal Medicine | Admitting: Internal Medicine

## 2011-11-15 DIAGNOSIS — G894 Chronic pain syndrome: Secondary | ICD-10-CM | POA: Diagnosis not present

## 2011-11-15 DIAGNOSIS — I2789 Other specified pulmonary heart diseases: Secondary | ICD-10-CM | POA: Diagnosis not present

## 2011-11-15 DIAGNOSIS — H409 Unspecified glaucoma: Secondary | ICD-10-CM | POA: Diagnosis not present

## 2011-11-15 DIAGNOSIS — R5383 Other fatigue: Secondary | ICD-10-CM | POA: Diagnosis not present

## 2011-11-15 DIAGNOSIS — I10 Essential (primary) hypertension: Secondary | ICD-10-CM | POA: Diagnosis not present

## 2011-11-15 DIAGNOSIS — Z8674 Personal history of sudden cardiac arrest: Secondary | ICD-10-CM | POA: Diagnosis not present

## 2011-11-15 DIAGNOSIS — I5023 Acute on chronic systolic (congestive) heart failure: Secondary | ICD-10-CM | POA: Diagnosis not present

## 2011-11-15 DIAGNOSIS — E785 Hyperlipidemia, unspecified: Secondary | ICD-10-CM | POA: Diagnosis not present

## 2011-11-15 DIAGNOSIS — I2510 Atherosclerotic heart disease of native coronary artery without angina pectoris: Secondary | ICD-10-CM | POA: Diagnosis not present

## 2011-11-15 DIAGNOSIS — I5022 Chronic systolic (congestive) heart failure: Secondary | ICD-10-CM | POA: Diagnosis not present

## 2011-11-15 DIAGNOSIS — I5021 Acute systolic (congestive) heart failure: Secondary | ICD-10-CM | POA: Diagnosis not present

## 2011-11-15 DIAGNOSIS — M6281 Muscle weakness (generalized): Secondary | ICD-10-CM | POA: Diagnosis not present

## 2011-11-15 DIAGNOSIS — J9610 Chronic respiratory failure, unspecified whether with hypoxia or hypercapnia: Secondary | ICD-10-CM | POA: Diagnosis not present

## 2011-11-15 DIAGNOSIS — E662 Morbid (severe) obesity with alveolar hypoventilation: Secondary | ICD-10-CM | POA: Diagnosis not present

## 2011-11-15 DIAGNOSIS — J9600 Acute respiratory failure, unspecified whether with hypoxia or hypercapnia: Secondary | ICD-10-CM | POA: Diagnosis not present

## 2011-11-15 DIAGNOSIS — I469 Cardiac arrest, cause unspecified: Secondary | ICD-10-CM | POA: Diagnosis not present

## 2011-11-15 DIAGNOSIS — I509 Heart failure, unspecified: Secondary | ICD-10-CM | POA: Diagnosis not present

## 2011-11-15 DIAGNOSIS — Z9119 Patient's noncompliance with other medical treatment and regimen: Secondary | ICD-10-CM | POA: Diagnosis not present

## 2011-11-15 DIAGNOSIS — G4733 Obstructive sleep apnea (adult) (pediatric): Secondary | ICD-10-CM | POA: Diagnosis not present

## 2011-11-15 LAB — BASIC METABOLIC PANEL
Calcium: 9.5 mg/dL (ref 8.4–10.5)
Creatinine, Ser: 0.55 mg/dL (ref 0.50–1.10)
GFR calc Af Amer: >90 mL/min (ref >90)
GFR calc non Af Amer: >90 mL/min (ref >90)
Sodium: 139 mEq/L (ref 135–145)

## 2011-11-15 NOTE — Progress Notes (Signed)
Subjective:  Overall she is doing well. Sitting up in chair. Comfortable, no shortness of breath.  Objective:  Vital Signs in the last 24 hours: Temp:  [98.2 F (36.8 C)-98.5 F (36.9 C)] 98.2 F (36.8 C) (02/14 0500) Pulse Rate:  [78-94] 84  (02/14 0500) Resp:  [18-20] 20  (02/14 0500) BP: (96-117)/(59-69) 117/69 mmHg (02/14 0500) SpO2:  [93 %-94 %] 93 % (02/14 0500) Weight:  [139 kg (306 lb 7 oz)] 139 kg (306 lb 7 oz) (02/14 0500)  Intake/Output from previous day: 02/13 0701 - 02/14 0700 In: 960 [P.O.:960] Out: 5 [Urine:4; Stool:1]   Physical Exam: General: Well developed, well nourished, in no acute distress. Head:  Normocephalic and atraumatic. Lungs: Clear to auscultation and percussion. Heart: Normal S1 and S2.  No murmur, rubs or gallops.  Abdomen: soft, non-tender, positive bowel sounds. obese Extremities: much improved edema Neurologic: Alert and oriented x 3.    Lab Results: No results found for this basename: WBC:2,HGB:2,PLT:2 in the last 72 hours  Basename 11/15/11 0542 11/14/11 0700  NA 139 138  K 4.0 4.2  CL 102 102  CO2 27 23  GLUCOSE 161* 177*  BUN 21 21  CREATININE 0.55 0.53  Assessment/Plan:  Active Problems:  Cardiac arrest  Respiratory failure with hypercapnia  Acute on chronic systolic congestive heart failure  Shock liver  Leukocytosis  Diabetes mellitus type 2, uncontrolled   -waiting for rehabilitation facility. Okay from my standpoint for discharge. Continue current medications.  Whitney [name] 11/15/2011, 9:10 AM

## 2011-11-15 NOTE — Discharge Summary (Signed)
This addendum is to update patient's discharge disposition. She was evaluated by [facility], and she qualifies for admission to their center. Patient eager to go to [facility]. Discharge diagnosis and medications remain the same as dictated yesterday.

## 2011-11-15 NOTE — Progress Notes (Signed)
Physical Therapy Treatment Patient Details Name: Whitney [name] MRN: [id number] DOB: Jul 22, 1956 Today's Date: 11/15/2011  PT Assessment/Plan  PT - Assessment/Plan Comments on Treatment Session: Pt amb on 3L O2 and saO2 decreased to 90%.  Pt had amb to BR with nursing without O2 and SaO2 decreased to 84% but recovered quickly. PT Plan: Discharge plan remains appropriate PT Frequency: Min 3X/week Follow Up Recommendations: Skilled nursing facility Equipment Recommended: Defer to next venue PT Goals  Acute Rehab PT Goals PT Goal: Sit to Stand - Progress: Progressing toward goal PT Goal: Stand to Sit - Progress: Progressing toward goal PT Goal: Ambulate - Progress: Progressing toward goal  PT Treatment Precautions/Restrictions  Precautions Precautions: Fall Required Braces or Orthoses: No Restrictions Weight Bearing Restrictions: No Mobility (including Balance) Bed Mobility Bed Mobility: No Supine to Sit: Not tested (comment) Sitting - Scoot to Edge of Bed: Not tested (comment) Transfers Transfers: Yes Sit to Stand: 4: Min assist;From chair/3-in-1;From toilet Sit to Stand Details (indicate cue type and reason): cues for hand placement Stand to Sit: 4: Min assist;To chair/3-in-1 Stand to Sit Details: cues for hand placement Stand Pivot Transfers: Not tested (comment) Ambulation/Gait Ambulation/Gait: Yes Ambulation/Gait Assistance: 4: Min assist Ambulation/Gait Assistance Details (indicate cue type and reason): cues to pace ambulation and for rest breaks Ambulation Distance (Feet): 90 Feet Assistive device: Rolling walker Gait Pattern: Step-through pattern;Decreased stride length Stairs: No Wheelchair Mobility Wheelchair Mobility: No  Posture/Postural Control Posture/Postural Control: No significant limitations Balance Balance Assessed: No Exercise    End of Session PT - End of Session Equipment Utilized During Treatment: Gait belt Activity Tolerance: Patient  tolerated treatment well;Patient limited by fatigue Patient left: in chair;with call bell in reach Nurse Communication: Mobility status for transfers;Mobility status for ambulation General Behavior During Session: [facility] for tasks performed Cognition: [facility] for tasks performed  Newell Coral 11/15/2011, 12:53 PM  Newell Coral, PTA  [phone]

## 2011-11-16 ENCOUNTER — Other Ambulatory Visit (HOSPITAL_COMMUNITY): Payer: Self-pay

## 2011-11-16 DIAGNOSIS — I5022 Chronic systolic (congestive) heart failure: Secondary | ICD-10-CM | POA: Diagnosis not present

## 2011-11-16 DIAGNOSIS — R0602 Shortness of breath: Secondary | ICD-10-CM | POA: Diagnosis not present

## 2011-11-16 DIAGNOSIS — G894 Chronic pain syndrome: Secondary | ICD-10-CM | POA: Diagnosis not present

## 2011-11-16 DIAGNOSIS — J9600 Acute respiratory failure, unspecified whether with hypoxia or hypercapnia: Secondary | ICD-10-CM | POA: Diagnosis not present

## 2011-11-16 DIAGNOSIS — E662 Morbid (severe) obesity with alveolar hypoventilation: Secondary | ICD-10-CM | POA: Diagnosis not present

## 2011-11-16 DIAGNOSIS — I517 Cardiomegaly: Secondary | ICD-10-CM | POA: Diagnosis not present

## 2011-11-16 DIAGNOSIS — J9819 Other pulmonary collapse: Secondary | ICD-10-CM | POA: Diagnosis not present

## 2011-11-16 DIAGNOSIS — E119 Type 2 diabetes mellitus without complications: Secondary | ICD-10-CM | POA: Diagnosis not present

## 2011-11-16 DIAGNOSIS — G4733 Obstructive sleep apnea (adult) (pediatric): Secondary | ICD-10-CM | POA: Diagnosis not present

## 2011-11-16 DIAGNOSIS — J681 Pulmonary edema due to chemicals, gases, fumes and vapors: Secondary | ICD-10-CM | POA: Diagnosis not present

## 2011-11-16 DIAGNOSIS — E785 Hyperlipidemia, unspecified: Secondary | ICD-10-CM | POA: Diagnosis not present

## 2011-11-16 DIAGNOSIS — M6281 Muscle weakness (generalized): Secondary | ICD-10-CM | POA: Diagnosis not present

## 2011-11-16 DIAGNOSIS — J9610 Chronic respiratory failure, unspecified whether with hypoxia or hypercapnia: Secondary | ICD-10-CM | POA: Diagnosis not present

## 2011-11-16 DIAGNOSIS — IMO0001 Reserved for inherently not codable concepts without codable children: Secondary | ICD-10-CM | POA: Diagnosis not present

## 2011-11-16 DIAGNOSIS — I509 Heart failure, unspecified: Secondary | ICD-10-CM | POA: Diagnosis not present

## 2011-11-16 DIAGNOSIS — I10 Essential (primary) hypertension: Secondary | ICD-10-CM | POA: Diagnosis not present

## 2011-11-16 DIAGNOSIS — R5381 Other malaise: Secondary | ICD-10-CM | POA: Diagnosis not present

## 2011-11-16 DIAGNOSIS — J90 Pleural effusion, not elsewhere classified: Secondary | ICD-10-CM | POA: Diagnosis not present

## 2011-11-16 LAB — RETICULOCYTES
RBC.: 4.33 MIL/uL (ref 3.87–5.11)
Retic Count, Absolute: 99.6 10*3/uL (ref 19.0–186.0)
Retic Ct Pct: 2.3 % (ref 0.4–3.1)

## 2011-11-16 LAB — DIFFERENTIAL
Lymphocytes Relative: 18 % (ref 12–46)
Lymphs Abs: 2.5 10*3/uL (ref 0.7–4.0)
Monocytes Relative: 5 % (ref 3–12)
Neutro Abs: 10.1 10*3/uL — ABNORMAL HIGH (ref 1.7–7.7)
Neutrophils Relative %: 73 % (ref 43–77)

## 2011-11-16 LAB — FERRITIN: Ferritin: 253 ng/mL (ref 10–291)

## 2011-11-16 LAB — MAGNESIUM: Magnesium: 1.9 mg/dL (ref 1.5–2.5)

## 2011-11-16 LAB — CBC
Hemoglobin: 11.3 g/dL — ABNORMAL LOW (ref 12.0–15.0)
MCHC: 31.4 g/dL (ref 30.0–36.0)
Platelets: 324 10*3/uL (ref 150–400)

## 2011-11-16 LAB — HEMOGLOBIN A1C: Mean Plasma Glucose: 171 mg/dL — ABNORMAL HIGH (ref <117)

## 2011-11-16 LAB — BASIC METABOLIC PANEL
Calcium: 9.7 mg/dL (ref 8.4–10.5)
Creatinine, Ser: 0.57 mg/dL (ref 0.50–1.10)
GFR calc Af Amer: >90 mL/min (ref >90)

## 2011-11-16 LAB — VITAMIN B12: Vitamin B-12: 724 pg/mL (ref 211–911)

## 2011-11-16 LAB — PROCALCITONIN: Procalcitonin: 0.16 ng/mL

## 2011-11-16 LAB — TSH: TSH: 3.348 u[IU]/mL (ref 0.350–4.500)

## 2011-11-16 LAB — IRON AND TIBC: Saturation Ratios: 23 % (ref 20–55)

## 2011-11-16 LAB — HEPATIC FUNCTION PANEL
Indirect Bilirubin: 0.6 mg/dL (ref 0.3–0.9)
Total Protein: 7.1 g/dL (ref 6.0–8.3)

## 2011-11-28 DIAGNOSIS — R0602 Shortness of breath: Secondary | ICD-10-CM | POA: Diagnosis not present

## 2011-11-28 DIAGNOSIS — I10 Essential (primary) hypertension: Secondary | ICD-10-CM | POA: Diagnosis not present

## 2011-11-28 DIAGNOSIS — Z79899 Other long term (current) drug therapy: Secondary | ICD-10-CM | POA: Diagnosis not present

## 2011-11-28 DIAGNOSIS — I509 Heart failure, unspecified: Secondary | ICD-10-CM | POA: Diagnosis not present

## 2011-11-30 ENCOUNTER — Encounter: Payer: Self-pay | Admitting: Pulmonary Disease

## 2011-11-30 ENCOUNTER — Ambulatory Visit (INDEPENDENT_AMBULATORY_CARE_PROVIDER_SITE_OTHER): Payer: Medicare Other | Admitting: Pulmonary Disease

## 2011-11-30 VITALS — BP 110/70 | HR 74 | Temp 98.2°F | Ht 63.0 in | Wt 320.4 lb

## 2011-11-30 DIAGNOSIS — G4733 Obstructive sleep apnea (adult) (pediatric): Secondary | ICD-10-CM

## 2011-11-30 NOTE — Patient Instructions (Signed)
Schedule sleep study We will get you a CPAP machine after the study based on the results

## 2011-11-30 NOTE — Assessment & Plan Note (Signed)
Given excessive daytime somnolence, narrow pharyngeal exam, witnessed apneas & loud snoring, obstructive sleep apnea is very likely & an overnight polysomnogram will be scheduled as a split study. The pathophysiology of obstructive sleep apnea , it's cardiovascular consequences & modes of treatment including CPAP were discused with the patient in detail & they evidenced understanding. May obtian a baseline abg in the future to document hypercarbia - if high pressures required, will use bipap, otherwise cpap may be adequate

## 2011-11-30 NOTE — Progress Notes (Signed)
  Subjective:    Patient ID: Whitney [name], female    DOB: 10-Apr-1956, 56 y.o.   MRN: [id number]  HPI  PCP - Shaune Pollack Cards- [place]  56 yo AAF with recent history of dyspnea, chest pain and left leg pain sent to [facility] ED to r/o PE. Suffered PEA cardiac arrest in CT scanner, was intubated and successfully resuscitated. Subsequently developed hypertensive emergency associated with pulmonary edema and severe hypoxia. Improved with Lasix / Nitroglycerine. UNderwent hypothermia protocol. CT angio was neg for PE. Review of ABGs does not show significant resp acidosis She appears to have recovered well. Chronic hypercarbic respiratory failure felt to be secondary to sleep apnea, obesity hypoventilation syndrome.  BiPAP was used during hospitalisation but not discharged on this.She ambulates with a walker 2-D echo showed  an ejection fraction of 30-35%. ESS 10/24 Bedtime 1-1.30 a, lives with mother, latency <30 mins, 2 awakenings, sleeps with her bed upright 45 deg & 1 pillow, oob at 0800, no nocturia, had sleep studies in the remote past but not available, never used cpap prior to this episode. There is no history suggestive of cataplexy, sleep paralysis or parasomnias   Review of Systems  Constitutional: Negative for fever and unexpected weight change.  HENT: Negative for ear pain, nosebleeds, congestion, sore throat, rhinorrhea, sneezing, trouble swallowing, dental problem, postnasal drip and sinus pressure.   Eyes: Negative for redness and itching.  Respiratory: Positive for shortness of breath. Negative for cough, chest tightness and wheezing.   Cardiovascular: Negative for palpitations and leg swelling.  Gastrointestinal: Negative for nausea and vomiting.  Genitourinary: Negative for dysuria.  Musculoskeletal: Negative for joint swelling.  Skin: Negative for rash.  Neurological: Negative for headaches.  Hematological: Does not bruise/bleed easily.  Psychiatric/Behavioral: Negative for  dysphoric mood. The patient is not nervous/anxious.        Objective:   Physical Exam  Gen. Pleasant, obese, in no distress, normal affect ENT - no lesions, no post nasal drip, class 2-3 airway Neck: No JVD, no thyromegaly, no carotid bruits Lungs: no use of accessory muscles, no dullness to percussion, decreased without rales or rhonchi  Cardiovascular: Rhythm regular, heart sounds  normal, no murmurs or gallops, no peripheral edema Abdomen: soft and non-tender, no hepatosplenomegaly, BS normal. Musculoskeletal: No deformities, no cyanosis or clubbing Neuro:  alert, non focal, no tremors       Assessment & Plan:

## 2011-11-30 DEATH — deceased

## 2011-12-03 DIAGNOSIS — I10 Essential (primary) hypertension: Secondary | ICD-10-CM | POA: Diagnosis not present

## 2011-12-03 DIAGNOSIS — Z23 Encounter for immunization: Secondary | ICD-10-CM | POA: Diagnosis not present

## 2011-12-03 DIAGNOSIS — K219 Gastro-esophageal reflux disease without esophagitis: Secondary | ICD-10-CM | POA: Diagnosis not present

## 2011-12-03 DIAGNOSIS — E119 Type 2 diabetes mellitus without complications: Secondary | ICD-10-CM | POA: Diagnosis not present

## 2011-12-03 DIAGNOSIS — E7800 Pure hypercholesterolemia, unspecified: Secondary | ICD-10-CM | POA: Diagnosis not present

## 2011-12-07 DIAGNOSIS — I509 Heart failure, unspecified: Secondary | ICD-10-CM | POA: Diagnosis not present

## 2011-12-07 DIAGNOSIS — I5023 Acute on chronic systolic (congestive) heart failure: Secondary | ICD-10-CM | POA: Diagnosis not present

## 2011-12-07 DIAGNOSIS — E119 Type 2 diabetes mellitus without complications: Secondary | ICD-10-CM | POA: Diagnosis not present

## 2011-12-07 DIAGNOSIS — M25559 Pain in unspecified hip: Secondary | ICD-10-CM | POA: Diagnosis not present

## 2011-12-07 DIAGNOSIS — I10 Essential (primary) hypertension: Secondary | ICD-10-CM | POA: Diagnosis not present

## 2011-12-07 DIAGNOSIS — G4733 Obstructive sleep apnea (adult) (pediatric): Secondary | ICD-10-CM | POA: Diagnosis not present

## 2011-12-10 DIAGNOSIS — I5023 Acute on chronic systolic (congestive) heart failure: Secondary | ICD-10-CM | POA: Diagnosis not present

## 2011-12-10 DIAGNOSIS — E119 Type 2 diabetes mellitus without complications: Secondary | ICD-10-CM | POA: Diagnosis not present

## 2011-12-10 DIAGNOSIS — G4733 Obstructive sleep apnea (adult) (pediatric): Secondary | ICD-10-CM | POA: Diagnosis not present

## 2011-12-10 DIAGNOSIS — M25559 Pain in unspecified hip: Secondary | ICD-10-CM | POA: Diagnosis not present

## 2011-12-10 DIAGNOSIS — I10 Essential (primary) hypertension: Secondary | ICD-10-CM | POA: Diagnosis not present

## 2011-12-10 DIAGNOSIS — I509 Heart failure, unspecified: Secondary | ICD-10-CM | POA: Diagnosis not present

## 2011-12-14 DIAGNOSIS — G4733 Obstructive sleep apnea (adult) (pediatric): Secondary | ICD-10-CM | POA: Diagnosis not present

## 2011-12-14 DIAGNOSIS — E119 Type 2 diabetes mellitus without complications: Secondary | ICD-10-CM | POA: Diagnosis not present

## 2011-12-14 DIAGNOSIS — I509 Heart failure, unspecified: Secondary | ICD-10-CM | POA: Diagnosis not present

## 2011-12-14 DIAGNOSIS — I5023 Acute on chronic systolic (congestive) heart failure: Secondary | ICD-10-CM | POA: Diagnosis not present

## 2011-12-14 DIAGNOSIS — M25559 Pain in unspecified hip: Secondary | ICD-10-CM | POA: Diagnosis not present

## 2011-12-14 DIAGNOSIS — I10 Essential (primary) hypertension: Secondary | ICD-10-CM | POA: Diagnosis not present

## 2011-12-19 ENCOUNTER — Ambulatory Visit (HOSPITAL_BASED_OUTPATIENT_CLINIC_OR_DEPARTMENT_OTHER): Payer: Medicare Other | Attending: Pulmonary Disease | Admitting: Radiology

## 2011-12-19 VITALS — Ht 63.0 in | Wt 320.0 lb

## 2011-12-19 DIAGNOSIS — Z6841 Body Mass Index (BMI) 40.0 and over, adult: Secondary | ICD-10-CM | POA: Insufficient documentation

## 2011-12-19 DIAGNOSIS — I5023 Acute on chronic systolic (congestive) heart failure: Secondary | ICD-10-CM | POA: Diagnosis not present

## 2011-12-19 DIAGNOSIS — I10 Essential (primary) hypertension: Secondary | ICD-10-CM | POA: Diagnosis not present

## 2011-12-19 DIAGNOSIS — E119 Type 2 diabetes mellitus without complications: Secondary | ICD-10-CM | POA: Diagnosis not present

## 2011-12-19 DIAGNOSIS — I509 Heart failure, unspecified: Secondary | ICD-10-CM | POA: Diagnosis not present

## 2011-12-19 DIAGNOSIS — G4733 Obstructive sleep apnea (adult) (pediatric): Secondary | ICD-10-CM

## 2011-12-19 DIAGNOSIS — Z7982 Long term (current) use of aspirin: Secondary | ICD-10-CM | POA: Insufficient documentation

## 2011-12-19 DIAGNOSIS — Z79899 Other long term (current) drug therapy: Secondary | ICD-10-CM | POA: Insufficient documentation

## 2011-12-19 DIAGNOSIS — M25559 Pain in unspecified hip: Secondary | ICD-10-CM | POA: Diagnosis not present

## 2011-12-21 DIAGNOSIS — Z79899 Other long term (current) drug therapy: Secondary | ICD-10-CM

## 2011-12-21 DIAGNOSIS — Z7982 Long term (current) use of aspirin: Secondary | ICD-10-CM

## 2011-12-21 DIAGNOSIS — G4733 Obstructive sleep apnea (adult) (pediatric): Secondary | ICD-10-CM

## 2011-12-21 DIAGNOSIS — Z6841 Body Mass Index (BMI) 40.0 and over, adult: Secondary | ICD-10-CM

## 2011-12-22 NOTE — Procedures (Signed)
NAMEADNA, NOFZIGER NO.:  [id number]  MEDICAL RECORD NO.:  [id number]          PATIENT TYPE:  OUT  LOCATION:  SLEEP CENTER                 FACILITY:  [facility]  PHYSICIAN:  Oretha Milch, MD      DATE OF BIRTH:  Aug 14, 1956  DATE OF STUDY:  12/21/2011                           NOCTURNAL POLYSOMNOGRAM  REFERRING PHYSICIAN:  Oretha Milch, MD  INDICATION FOR STUDY:  Ms. Clavin is a morbidly obese 56 year old woman with a recent episode of cardiac arrest and hypertensive emergency.  At the time of this study, she weighed 320 pounds with a height of 5 feet 3 inches, BMI of 57.  EPWORTH SLEEPINESS SCORE:  4.  MEDICATIONS:  Bedtime medications included pravastatin and aspirin.  This interventional polysomnogram was performed with a sleep technologist in attendance.  EEG, EOG, EMG, EKG, and respiratory parameters were recorded.  Sleep stages, arousal, limb movements, and respiratory data were scored according to criteria laid out by the American Academy of Sleep Medicine.  SLEEP ARCHITECTURE:  Lights off was at 10:21 p.m. Lights on was at 4:52 a.m. CPAP was initiated at 2:19 a.m.  During the diagnostic portion, total sleep time was 203 minutes with a sleep period time of 228 minutes and a sleep efficiency of 86%.  Sleep latency was 9 minutes. Latency to REM sleep was 112 minutes.  Sleep stages of the percentage of total sleep time was N1 3%, N2 86%, N3 0%, and REM sleep 11%. Supine sleep accounted for 213 minutes and supine REM sleep was noted for 22 minutes. During the titration portion, REM sleep accounted for 22 minutes and supine REM sleep for the same duration. Supine REM sleep was noted around 3:30 a.m.  RESPIRATORY DATA:  During the diagnostic portion, there were 33 hypopneas with an apnea-hypopnea index of 10 events per hour, lowest desaturation of 81%.  Longest hypopnea was 51 seconds.  About half of these events were noted during REM sleep.  Due to this  degree of respiratory disturbance, CPAP was initiated at 5 cm and titrated to a final level of 7 cm with humidity and a small full-face mask.  At a level of 7 cm for 53 minutes including 10 minutes of REM sleep, no events were noted with a lowest desaturation of 87%.  This appears to be the optimal level used during the study.  AROUSAL DATA:  During the baseline portion, the arousal index was 6 events per hour.  During the titration event portion, this was 3 events per hour.  LIMB MOVEMENT DATA:  No significant limb movements were noted.  OXYGEN DATA:  The desaturation index during the diagnostic portion was 10 events per hour with the lowest saturation of 78%.  She spent 5 minutes with a saturation less than 88% during the titration portion.  CARDIAC DATA:  The low heart rate was 35 beats per minute.  The high heart rate was 103 beats per minute.  No arrhythmias were noted.  DISCUSSION:  She was desensitized with a small full-face Quattro mask and seemed to tolerate CPAP quite well.  MOVEMENT-PARASOMNIA:none noted  IMPRESSIONS-RECOMMENDATIONS: 1. Mild obstructive sleep apnea with predominantly hypopneas during  REM sleep with moderate oxygen desaturation. 2. This was corrected by CPAP of 7 cm with a small full-face mask. 3. No evidence of cardiac arrhythmias, limb movements, or behavioral     disturbance during sleep.  1. The treatment option for this degree of sleep-disordered breathing     include weight loss and CPAP therapy. 2. CPAP should be initiated at 7 cm with a small full-face mask with     heated humidity.  Compliance should be monitored at this level. 3. She should be advised against medications with sedative side effects.     She should be cautioned against driving when sleepy.     Oretha Milch, MD    RVA/MEDQ  D:  12/21/2011 16:09:23  T:  12/22/2011 02:55:27  Job:  [id number]

## 2011-12-23 ENCOUNTER — Telehealth: Payer: Self-pay | Admitting: Pulmonary Disease

## 2011-12-23 DIAGNOSIS — G4733 Obstructive sleep apnea (adult) (pediatric): Secondary | ICD-10-CM

## 2011-12-23 NOTE — Telephone Encounter (Signed)
psg showed obstructive sleep apnea - stopped breathing few times/ hour If agreeable, sen dorder to dme for cpap 7 cm with small quattro mask, humidity, download  & fu in 4 wks

## 2011-12-24 DIAGNOSIS — I10 Essential (primary) hypertension: Secondary | ICD-10-CM | POA: Diagnosis not present

## 2011-12-24 DIAGNOSIS — E119 Type 2 diabetes mellitus without complications: Secondary | ICD-10-CM | POA: Diagnosis not present

## 2011-12-24 DIAGNOSIS — I5023 Acute on chronic systolic (congestive) heart failure: Secondary | ICD-10-CM | POA: Diagnosis not present

## 2011-12-24 DIAGNOSIS — G4733 Obstructive sleep apnea (adult) (pediatric): Secondary | ICD-10-CM | POA: Diagnosis not present

## 2011-12-24 DIAGNOSIS — I509 Heart failure, unspecified: Secondary | ICD-10-CM | POA: Diagnosis not present

## 2011-12-24 DIAGNOSIS — M25559 Pain in unspecified hip: Secondary | ICD-10-CM | POA: Diagnosis not present

## 2011-12-24 NOTE — Telephone Encounter (Signed)
I spoke with patient about results and she verbalized understanding and had no questions. She agree'd to be set up on cpap machine and i have placed order. Pt is aware someone will be contacting her to get this set up.

## 2011-12-26 DIAGNOSIS — M25559 Pain in unspecified hip: Secondary | ICD-10-CM | POA: Diagnosis not present

## 2011-12-26 DIAGNOSIS — I5023 Acute on chronic systolic (congestive) heart failure: Secondary | ICD-10-CM | POA: Diagnosis not present

## 2011-12-26 DIAGNOSIS — G4733 Obstructive sleep apnea (adult) (pediatric): Secondary | ICD-10-CM | POA: Diagnosis not present

## 2011-12-26 DIAGNOSIS — I509 Heart failure, unspecified: Secondary | ICD-10-CM | POA: Diagnosis not present

## 2011-12-26 DIAGNOSIS — E119 Type 2 diabetes mellitus without complications: Secondary | ICD-10-CM | POA: Diagnosis not present

## 2011-12-26 DIAGNOSIS — I10 Essential (primary) hypertension: Secondary | ICD-10-CM | POA: Diagnosis not present

## 2011-12-27 DIAGNOSIS — I10 Essential (primary) hypertension: Secondary | ICD-10-CM | POA: Diagnosis not present

## 2011-12-27 DIAGNOSIS — I5023 Acute on chronic systolic (congestive) heart failure: Secondary | ICD-10-CM | POA: Diagnosis not present

## 2011-12-27 DIAGNOSIS — I509 Heart failure, unspecified: Secondary | ICD-10-CM | POA: Diagnosis not present

## 2011-12-27 DIAGNOSIS — M25559 Pain in unspecified hip: Secondary | ICD-10-CM | POA: Diagnosis not present

## 2011-12-27 DIAGNOSIS — E119 Type 2 diabetes mellitus without complications: Secondary | ICD-10-CM | POA: Diagnosis not present

## 2011-12-27 DIAGNOSIS — G4733 Obstructive sleep apnea (adult) (pediatric): Secondary | ICD-10-CM | POA: Diagnosis not present

## 2011-12-28 DIAGNOSIS — I10 Essential (primary) hypertension: Secondary | ICD-10-CM | POA: Diagnosis not present

## 2011-12-28 DIAGNOSIS — M25559 Pain in unspecified hip: Secondary | ICD-10-CM | POA: Diagnosis not present

## 2011-12-28 DIAGNOSIS — I509 Heart failure, unspecified: Secondary | ICD-10-CM | POA: Diagnosis not present

## 2011-12-28 DIAGNOSIS — E119 Type 2 diabetes mellitus without complications: Secondary | ICD-10-CM | POA: Diagnosis not present

## 2011-12-28 DIAGNOSIS — G4733 Obstructive sleep apnea (adult) (pediatric): Secondary | ICD-10-CM | POA: Diagnosis not present

## 2011-12-28 DIAGNOSIS — I5023 Acute on chronic systolic (congestive) heart failure: Secondary | ICD-10-CM | POA: Diagnosis not present

## 2012-01-01 DIAGNOSIS — E119 Type 2 diabetes mellitus without complications: Secondary | ICD-10-CM | POA: Diagnosis not present

## 2012-01-01 DIAGNOSIS — I509 Heart failure, unspecified: Secondary | ICD-10-CM | POA: Diagnosis not present

## 2012-01-01 DIAGNOSIS — I10 Essential (primary) hypertension: Secondary | ICD-10-CM | POA: Diagnosis not present

## 2012-01-01 DIAGNOSIS — I5023 Acute on chronic systolic (congestive) heart failure: Secondary | ICD-10-CM | POA: Diagnosis not present

## 2012-01-01 DIAGNOSIS — M25559 Pain in unspecified hip: Secondary | ICD-10-CM | POA: Diagnosis not present

## 2012-01-01 DIAGNOSIS — G4733 Obstructive sleep apnea (adult) (pediatric): Secondary | ICD-10-CM | POA: Diagnosis not present

## 2012-01-03 DIAGNOSIS — I509 Heart failure, unspecified: Secondary | ICD-10-CM | POA: Diagnosis not present

## 2012-01-03 DIAGNOSIS — M25559 Pain in unspecified hip: Secondary | ICD-10-CM | POA: Diagnosis not present

## 2012-01-03 DIAGNOSIS — I10 Essential (primary) hypertension: Secondary | ICD-10-CM | POA: Diagnosis not present

## 2012-01-03 DIAGNOSIS — E119 Type 2 diabetes mellitus without complications: Secondary | ICD-10-CM | POA: Diagnosis not present

## 2012-01-03 DIAGNOSIS — I5023 Acute on chronic systolic (congestive) heart failure: Secondary | ICD-10-CM | POA: Diagnosis not present

## 2012-01-03 DIAGNOSIS — G4733 Obstructive sleep apnea (adult) (pediatric): Secondary | ICD-10-CM | POA: Diagnosis not present

## 2012-01-07 DIAGNOSIS — G4733 Obstructive sleep apnea (adult) (pediatric): Secondary | ICD-10-CM | POA: Diagnosis not present

## 2012-01-07 DIAGNOSIS — I509 Heart failure, unspecified: Secondary | ICD-10-CM | POA: Diagnosis not present

## 2012-01-07 DIAGNOSIS — M25559 Pain in unspecified hip: Secondary | ICD-10-CM | POA: Diagnosis not present

## 2012-01-07 DIAGNOSIS — I5023 Acute on chronic systolic (congestive) heart failure: Secondary | ICD-10-CM | POA: Diagnosis not present

## 2012-01-07 DIAGNOSIS — E119 Type 2 diabetes mellitus without complications: Secondary | ICD-10-CM | POA: Diagnosis not present

## 2012-01-07 DIAGNOSIS — I10 Essential (primary) hypertension: Secondary | ICD-10-CM | POA: Diagnosis not present

## 2012-01-08 DIAGNOSIS — I509 Heart failure, unspecified: Secondary | ICD-10-CM | POA: Diagnosis not present

## 2012-01-08 DIAGNOSIS — G4733 Obstructive sleep apnea (adult) (pediatric): Secondary | ICD-10-CM | POA: Diagnosis not present

## 2012-01-08 DIAGNOSIS — I10 Essential (primary) hypertension: Secondary | ICD-10-CM | POA: Diagnosis not present

## 2012-01-08 DIAGNOSIS — E119 Type 2 diabetes mellitus without complications: Secondary | ICD-10-CM | POA: Diagnosis not present

## 2012-01-08 DIAGNOSIS — M25559 Pain in unspecified hip: Secondary | ICD-10-CM | POA: Diagnosis not present

## 2012-01-08 DIAGNOSIS — I5023 Acute on chronic systolic (congestive) heart failure: Secondary | ICD-10-CM | POA: Diagnosis not present

## 2012-01-10 ENCOUNTER — Telehealth: Payer: Self-pay | Admitting: *Deleted

## 2012-01-10 DIAGNOSIS — M25559 Pain in unspecified hip: Secondary | ICD-10-CM | POA: Diagnosis not present

## 2012-01-10 DIAGNOSIS — G4733 Obstructive sleep apnea (adult) (pediatric): Secondary | ICD-10-CM | POA: Diagnosis not present

## 2012-01-10 DIAGNOSIS — I10 Essential (primary) hypertension: Secondary | ICD-10-CM | POA: Diagnosis not present

## 2012-01-10 DIAGNOSIS — E119 Type 2 diabetes mellitus without complications: Secondary | ICD-10-CM | POA: Diagnosis not present

## 2012-01-10 DIAGNOSIS — I5023 Acute on chronic systolic (congestive) heart failure: Secondary | ICD-10-CM | POA: Diagnosis not present

## 2012-01-10 DIAGNOSIS — I509 Heart failure, unspecified: Secondary | ICD-10-CM | POA: Diagnosis not present

## 2012-01-10 NOTE — Telephone Encounter (Signed)
Pt has a pending appt with Dr. Vassie Loll for tomorrow to discuss sleep study results.  Per Phone msg from 12/23/11, pt has already been notified of the sleep study results and cpap was started.  Per Dr. Reginia Naas instructions pt needs download & fu in 4 wks.  Spoke with Mindy regarding this.  Per MIndy, pt does not need to come in for this appt as cpap was just started.  She has a pending OV with Dr. Vassie Loll on May 17 and will need to come in for this appt.  Called, spoke with pt.  Pt stated she started cpap on Saturday.  Advised she does not need to keep appt tomorrow unless she is having problems as she has just started her cpap.  Pt would like to cancel tomorrow's appt and keep the pending appt with Vassie Loll on May 17.  I advised she should call back prior to the May appt if she has problems with the cpap or has any questions or concerns.  She verbalized understanding of this and is in agreement with this plan.  Nothing further needed at this time.

## 2012-01-11 ENCOUNTER — Ambulatory Visit: Payer: Medicare Other | Admitting: Pulmonary Disease

## 2012-01-14 DIAGNOSIS — I5023 Acute on chronic systolic (congestive) heart failure: Secondary | ICD-10-CM | POA: Diagnosis not present

## 2012-01-14 DIAGNOSIS — E119 Type 2 diabetes mellitus without complications: Secondary | ICD-10-CM | POA: Diagnosis not present

## 2012-01-14 DIAGNOSIS — G4733 Obstructive sleep apnea (adult) (pediatric): Secondary | ICD-10-CM | POA: Diagnosis not present

## 2012-01-14 DIAGNOSIS — R092 Respiratory arrest: Secondary | ICD-10-CM | POA: Diagnosis not present

## 2012-01-14 DIAGNOSIS — I10 Essential (primary) hypertension: Secondary | ICD-10-CM | POA: Diagnosis not present

## 2012-01-14 DIAGNOSIS — M25559 Pain in unspecified hip: Secondary | ICD-10-CM | POA: Diagnosis not present

## 2012-01-14 DIAGNOSIS — I428 Other cardiomyopathies: Secondary | ICD-10-CM | POA: Diagnosis not present

## 2012-01-14 DIAGNOSIS — I509 Heart failure, unspecified: Secondary | ICD-10-CM | POA: Diagnosis not present

## 2012-01-15 DIAGNOSIS — G4733 Obstructive sleep apnea (adult) (pediatric): Secondary | ICD-10-CM | POA: Diagnosis not present

## 2012-01-15 DIAGNOSIS — I10 Essential (primary) hypertension: Secondary | ICD-10-CM | POA: Diagnosis not present

## 2012-01-15 DIAGNOSIS — I5023 Acute on chronic systolic (congestive) heart failure: Secondary | ICD-10-CM | POA: Diagnosis not present

## 2012-01-15 DIAGNOSIS — I509 Heart failure, unspecified: Secondary | ICD-10-CM | POA: Diagnosis not present

## 2012-01-15 DIAGNOSIS — E119 Type 2 diabetes mellitus without complications: Secondary | ICD-10-CM | POA: Diagnosis not present

## 2012-01-15 DIAGNOSIS — M25559 Pain in unspecified hip: Secondary | ICD-10-CM | POA: Diagnosis not present

## 2012-01-16 DIAGNOSIS — R092 Respiratory arrest: Secondary | ICD-10-CM | POA: Diagnosis not present

## 2012-01-16 DIAGNOSIS — E119 Type 2 diabetes mellitus without complications: Secondary | ICD-10-CM | POA: Diagnosis not present

## 2012-01-16 DIAGNOSIS — G4733 Obstructive sleep apnea (adult) (pediatric): Secondary | ICD-10-CM | POA: Diagnosis not present

## 2012-01-16 DIAGNOSIS — I428 Other cardiomyopathies: Secondary | ICD-10-CM | POA: Diagnosis not present

## 2012-01-16 DIAGNOSIS — I10 Essential (primary) hypertension: Secondary | ICD-10-CM | POA: Diagnosis not present

## 2012-01-16 DIAGNOSIS — I5023 Acute on chronic systolic (congestive) heart failure: Secondary | ICD-10-CM | POA: Diagnosis not present

## 2012-01-16 DIAGNOSIS — M25559 Pain in unspecified hip: Secondary | ICD-10-CM | POA: Diagnosis not present

## 2012-01-16 DIAGNOSIS — I509 Heart failure, unspecified: Secondary | ICD-10-CM | POA: Diagnosis not present

## 2012-01-17 DIAGNOSIS — I5023 Acute on chronic systolic (congestive) heart failure: Secondary | ICD-10-CM | POA: Diagnosis not present

## 2012-01-17 DIAGNOSIS — I10 Essential (primary) hypertension: Secondary | ICD-10-CM | POA: Diagnosis not present

## 2012-01-17 DIAGNOSIS — I509 Heart failure, unspecified: Secondary | ICD-10-CM | POA: Diagnosis not present

## 2012-01-17 DIAGNOSIS — G4733 Obstructive sleep apnea (adult) (pediatric): Secondary | ICD-10-CM | POA: Diagnosis not present

## 2012-01-17 DIAGNOSIS — M25559 Pain in unspecified hip: Secondary | ICD-10-CM | POA: Diagnosis not present

## 2012-01-17 DIAGNOSIS — E119 Type 2 diabetes mellitus without complications: Secondary | ICD-10-CM | POA: Diagnosis not present

## 2012-01-22 DIAGNOSIS — M25559 Pain in unspecified hip: Secondary | ICD-10-CM | POA: Diagnosis not present

## 2012-01-22 DIAGNOSIS — E119 Type 2 diabetes mellitus without complications: Secondary | ICD-10-CM | POA: Diagnosis not present

## 2012-01-22 DIAGNOSIS — I5023 Acute on chronic systolic (congestive) heart failure: Secondary | ICD-10-CM | POA: Diagnosis not present

## 2012-01-22 DIAGNOSIS — I509 Heart failure, unspecified: Secondary | ICD-10-CM | POA: Diagnosis not present

## 2012-01-22 DIAGNOSIS — I10 Essential (primary) hypertension: Secondary | ICD-10-CM | POA: Diagnosis not present

## 2012-01-22 DIAGNOSIS — G4733 Obstructive sleep apnea (adult) (pediatric): Secondary | ICD-10-CM | POA: Diagnosis not present

## 2012-01-23 DIAGNOSIS — I10 Essential (primary) hypertension: Secondary | ICD-10-CM | POA: Diagnosis not present

## 2012-01-23 DIAGNOSIS — I5023 Acute on chronic systolic (congestive) heart failure: Secondary | ICD-10-CM | POA: Diagnosis not present

## 2012-01-23 DIAGNOSIS — I509 Heart failure, unspecified: Secondary | ICD-10-CM | POA: Diagnosis not present

## 2012-01-23 DIAGNOSIS — M25559 Pain in unspecified hip: Secondary | ICD-10-CM | POA: Diagnosis not present

## 2012-01-23 DIAGNOSIS — E119 Type 2 diabetes mellitus without complications: Secondary | ICD-10-CM | POA: Diagnosis not present

## 2012-01-23 DIAGNOSIS — G4733 Obstructive sleep apnea (adult) (pediatric): Secondary | ICD-10-CM | POA: Diagnosis not present

## 2012-01-24 DIAGNOSIS — I10 Essential (primary) hypertension: Secondary | ICD-10-CM | POA: Diagnosis not present

## 2012-01-24 DIAGNOSIS — M25559 Pain in unspecified hip: Secondary | ICD-10-CM | POA: Diagnosis not present

## 2012-01-24 DIAGNOSIS — I509 Heart failure, unspecified: Secondary | ICD-10-CM | POA: Diagnosis not present

## 2012-01-24 DIAGNOSIS — E119 Type 2 diabetes mellitus without complications: Secondary | ICD-10-CM | POA: Diagnosis not present

## 2012-01-24 DIAGNOSIS — I5023 Acute on chronic systolic (congestive) heart failure: Secondary | ICD-10-CM | POA: Diagnosis not present

## 2012-01-24 DIAGNOSIS — G4733 Obstructive sleep apnea (adult) (pediatric): Secondary | ICD-10-CM | POA: Diagnosis not present

## 2012-01-28 DIAGNOSIS — I509 Heart failure, unspecified: Secondary | ICD-10-CM | POA: Diagnosis not present

## 2012-01-28 DIAGNOSIS — I5023 Acute on chronic systolic (congestive) heart failure: Secondary | ICD-10-CM | POA: Diagnosis not present

## 2012-01-28 DIAGNOSIS — G4733 Obstructive sleep apnea (adult) (pediatric): Secondary | ICD-10-CM | POA: Diagnosis not present

## 2012-01-28 DIAGNOSIS — I10 Essential (primary) hypertension: Secondary | ICD-10-CM | POA: Diagnosis not present

## 2012-01-28 DIAGNOSIS — M25559 Pain in unspecified hip: Secondary | ICD-10-CM | POA: Diagnosis not present

## 2012-01-28 DIAGNOSIS — E119 Type 2 diabetes mellitus without complications: Secondary | ICD-10-CM | POA: Diagnosis not present

## 2012-01-31 DIAGNOSIS — E7800 Pure hypercholesterolemia, unspecified: Secondary | ICD-10-CM | POA: Diagnosis not present

## 2012-01-31 DIAGNOSIS — E119 Type 2 diabetes mellitus without complications: Secondary | ICD-10-CM | POA: Diagnosis not present

## 2012-01-31 DIAGNOSIS — H6120 Impacted cerumen, unspecified ear: Secondary | ICD-10-CM | POA: Diagnosis not present

## 2012-01-31 DIAGNOSIS — I10 Essential (primary) hypertension: Secondary | ICD-10-CM | POA: Diagnosis not present

## 2012-01-31 DIAGNOSIS — R809 Proteinuria, unspecified: Secondary | ICD-10-CM | POA: Diagnosis not present

## 2012-02-01 DIAGNOSIS — M25559 Pain in unspecified hip: Secondary | ICD-10-CM | POA: Diagnosis not present

## 2012-02-01 DIAGNOSIS — I509 Heart failure, unspecified: Secondary | ICD-10-CM | POA: Diagnosis not present

## 2012-02-01 DIAGNOSIS — I10 Essential (primary) hypertension: Secondary | ICD-10-CM | POA: Diagnosis not present

## 2012-02-01 DIAGNOSIS — G4733 Obstructive sleep apnea (adult) (pediatric): Secondary | ICD-10-CM | POA: Diagnosis not present

## 2012-02-01 DIAGNOSIS — E119 Type 2 diabetes mellitus without complications: Secondary | ICD-10-CM | POA: Diagnosis not present

## 2012-02-01 DIAGNOSIS — I5023 Acute on chronic systolic (congestive) heart failure: Secondary | ICD-10-CM | POA: Diagnosis not present

## 2012-02-14 ENCOUNTER — Ambulatory Visit (HOSPITAL_COMMUNITY)
Admission: RE | Admit: 2012-02-14 | Discharge: 2012-02-14 | Disposition: A | Discharge status: Home / Self Care | Payer: Medicare Other | Source: Ambulatory Visit | Attending: Family Medicine | Admitting: Family Medicine

## 2012-02-14 DIAGNOSIS — I509 Heart failure, unspecified: Secondary | ICD-10-CM | POA: Diagnosis not present

## 2012-02-14 DIAGNOSIS — M79606 Pain in leg, unspecified: Secondary | ICD-10-CM

## 2012-02-14 DIAGNOSIS — R0602 Shortness of breath: Secondary | ICD-10-CM | POA: Diagnosis not present

## 2012-02-14 DIAGNOSIS — M7989 Other specified soft tissue disorders: Secondary | ICD-10-CM

## 2012-02-14 DIAGNOSIS — M79609 Pain in unspecified limb: Secondary | ICD-10-CM

## 2012-02-14 DIAGNOSIS — I8310 Varicose veins of unspecified lower extremity with inflammation: Secondary | ICD-10-CM | POA: Diagnosis not present

## 2012-02-14 NOTE — Progress Notes (Signed)
VASCULAR LAB PRELIMINARY  PRELIMINARY  PRELIMINARY  PRELIMINARY  Rt. LEV  completed.    Preliminary report:  Right LEV exam technically difficult and incomplete.  Unable to visualize all of the femoral, peroneal and posterior tibial veins.  No obvious DVT noted.  Attempted to call Dr. Shaune Pollack office and no answer.  Instructed patient to call office in AM. Vanna Scotland, 02/14/2012, 7:13 PM

## 2012-02-15 ENCOUNTER — Ambulatory Visit: Payer: Medicare Other | Admitting: Pulmonary Disease

## 2012-02-19 DIAGNOSIS — R809 Proteinuria, unspecified: Secondary | ICD-10-CM | POA: Diagnosis not present

## 2012-02-19 DIAGNOSIS — R609 Edema, unspecified: Secondary | ICD-10-CM | POA: Diagnosis not present

## 2012-02-19 DIAGNOSIS — I8310 Varicose veins of unspecified lower extremity with inflammation: Secondary | ICD-10-CM | POA: Diagnosis not present

## 2012-03-05 DIAGNOSIS — I10 Essential (primary) hypertension: Secondary | ICD-10-CM | POA: Diagnosis not present

## 2012-03-05 DIAGNOSIS — R609 Edema, unspecified: Secondary | ICD-10-CM | POA: Diagnosis not present

## 2012-03-07 ENCOUNTER — Telehealth: Payer: Self-pay | Admitting: Pulmonary Disease

## 2012-03-07 ENCOUNTER — Ambulatory Visit: Payer: Medicare Other | Admitting: Pulmonary Disease

## 2012-03-07 NOTE — Telephone Encounter (Signed)
LMTCB

## 2012-03-11 DIAGNOSIS — I872 Venous insufficiency (chronic) (peripheral): Secondary | ICD-10-CM | POA: Diagnosis not present

## 2012-03-11 DIAGNOSIS — Z79899 Other long term (current) drug therapy: Secondary | ICD-10-CM | POA: Diagnosis not present

## 2012-03-11 DIAGNOSIS — L819 Disorder of pigmentation, unspecified: Secondary | ICD-10-CM | POA: Diagnosis not present

## 2012-03-11 DIAGNOSIS — Z7982 Long term (current) use of aspirin: Secondary | ICD-10-CM | POA: Diagnosis not present

## 2012-03-11 NOTE — Telephone Encounter (Signed)
LMTCB

## 2012-03-12 NOTE — Telephone Encounter (Signed)
I spoke with the pt and offered her an appt for first available on 04-22-12 but she states she has to be seen before July for her CPAP review. Dr. Vassie Loll please advise if ok to double book or is July 23 ok. Carron Curie, CMA

## 2012-03-12 NOTE — Telephone Encounter (Signed)
Ok to make appt with TP

## 2012-03-12 NOTE — Telephone Encounter (Signed)
TP not in office Thursday through Tuesday- not an acute visit for sleep anyways per patient. She will wait to see RX on 04-22-12 at 330pm. Call sooner if needed.

## 2012-04-17 DIAGNOSIS — I5022 Chronic systolic (congestive) heart failure: Secondary | ICD-10-CM | POA: Diagnosis not present

## 2012-04-17 DIAGNOSIS — I10 Essential (primary) hypertension: Secondary | ICD-10-CM | POA: Diagnosis not present

## 2012-04-22 ENCOUNTER — Ambulatory Visit (INDEPENDENT_AMBULATORY_CARE_PROVIDER_SITE_OTHER): Payer: Medicare Other | Admitting: Pulmonary Disease

## 2012-04-22 ENCOUNTER — Encounter: Payer: Self-pay | Admitting: Pulmonary Disease

## 2012-04-22 VITALS — BP 122/88 | HR 86 | Temp 97.7°F | Ht 63.0 in | Wt 311.2 lb

## 2012-04-22 DIAGNOSIS — G4733 Obstructive sleep apnea (adult) (pediatric): Secondary | ICD-10-CM

## 2012-04-22 NOTE — Assessment & Plan Note (Addendum)
Weight loss encouraged CPAP usage with goal of at least 4-6 hrs every night is the expectation. - 7 cm adequate Advised against medications with sedative side effects Cautioned against driving when sleepy - understanding that sleepiness will vary on a day to day basis

## 2012-04-22 NOTE — Patient Instructions (Signed)
Weight loss encouraged CPAP usage with goal of at least 4-6 hrs every night is the expectation. Advised against medications with sedative side effects Cautioned against driving when sleepy - understanding that sleepiness will vary on a day to day basis

## 2012-04-22 NOTE — Progress Notes (Signed)
  Subjective:    Patient ID: Whitney [name], female    DOB: [birth year], 56 y.o.   MRN: [id number]  HPI  PCP - Shaune Pollack  Cards- Skains   56 yo AAF for FU of OSA She was hospitalised in 2/13 with dyspnea, chest pain and left leg pain . Suffered PEA cardiac arrest in CT scanner, was intubated and successfully resuscitated. Subsequently developed hypertensive emergency associated with pulmonary edema and severe hypoxia. Improved with Lasix / Nitroglycerine. UNderwent hypothermia protocol. CT angio was neg for PE. Review of ABGs did not show resp acidosis  She recovered well. Chronic hypercarbic respiratory failure felt to be secondary to sleep apnea. BiPAP was used during hospitalisation but not discharged on this 2-D echo showed an ejection fraction of 30-35%.  ESS 10/24  Bedtime 1-1.30 a, lives with mother, latency <30 mins, 2 awakenings, sleeps with her bed upright 45 deg & 1 pillow, oob at 0800, no nocturia, had sleep studies in the remote past but not available, never used cpap prior to this episode.     04/22/2012 3/13 psg showed obstructive sleep apnea - AHI 10/h Started on cpap 7 cm with small quattro mask, humidity, Sleeps longer, better rested Download 6/13 on 7 cm  shows good compliance, avg use 5h, no residual events  There is no history suggestive of cataplexy, sleep paralysis or parasomnias      Review of Systems Patient denies significant dyspnea,cough, hemoptysis,  chest pain, palpitations, pedal edema, orthopnea, paroxysmal nocturnal dyspnea, lightheadedness, nausea, vomiting, abdominal or  leg pains      Objective:   Physical Exam  Gen. Pleasant, obese, in no distress ENT - no lesions, no post nasal drip Neck: No JVD, no thyromegaly, no carotid bruits Lungs: no use of accessory muscles, no dullness to percussion, decreased without rales or rhonchi  Cardiovascular: Rhythm regular, heart sounds  normal, no murmurs or gallops, no peripheral edema Musculoskeletal:  No deformities, no cyanosis or clubbing , no tremors       Assessment & Plan:

## 2012-04-27 ENCOUNTER — Telehealth: Payer: Self-pay | Admitting: Pulmonary Disease

## 2012-04-27 NOTE — Telephone Encounter (Signed)
Download 6/13 on 7 cm  shows good compliance, avg use 5h, no residual events No change in pressure - use at least 6h every night

## 2012-04-29 NOTE — Telephone Encounter (Signed)
I spoke with patient about results and she verbalized understanding and had no questions 

## 2012-06-05 DIAGNOSIS — I10 Essential (primary) hypertension: Secondary | ICD-10-CM | POA: Diagnosis not present

## 2012-06-05 DIAGNOSIS — E119 Type 2 diabetes mellitus without complications: Secondary | ICD-10-CM | POA: Diagnosis not present

## 2012-06-05 DIAGNOSIS — M1710 Unilateral primary osteoarthritis, unspecified knee: Secondary | ICD-10-CM | POA: Diagnosis not present

## 2012-06-05 DIAGNOSIS — E7800 Pure hypercholesterolemia, unspecified: Secondary | ICD-10-CM | POA: Diagnosis not present

## 2012-06-05 DIAGNOSIS — Z23 Encounter for immunization: Secondary | ICD-10-CM | POA: Diagnosis not present

## 2012-08-06 DIAGNOSIS — H409 Unspecified glaucoma: Secondary | ICD-10-CM | POA: Diagnosis not present

## 2012-08-06 DIAGNOSIS — H4011X0 Primary open-angle glaucoma, stage unspecified: Secondary | ICD-10-CM | POA: Diagnosis not present

## 2012-08-19 DIAGNOSIS — I10 Essential (primary) hypertension: Secondary | ICD-10-CM | POA: Diagnosis not present

## 2012-08-19 DIAGNOSIS — I5022 Chronic systolic (congestive) heart failure: Secondary | ICD-10-CM | POA: Diagnosis not present

## 2012-08-19 DIAGNOSIS — G4730 Sleep apnea, unspecified: Secondary | ICD-10-CM | POA: Diagnosis not present

## 2012-09-17 DIAGNOSIS — M1710 Unilateral primary osteoarthritis, unspecified knee: Secondary | ICD-10-CM | POA: Diagnosis not present

## 2012-09-17 DIAGNOSIS — M76899 Other specified enthesopathies of unspecified lower limb, excluding foot: Secondary | ICD-10-CM | POA: Diagnosis not present

## 2012-10-23 DIAGNOSIS — M1990 Unspecified osteoarthritis, unspecified site: Secondary | ICD-10-CM | POA: Diagnosis not present

## 2012-10-23 DIAGNOSIS — E7800 Pure hypercholesterolemia, unspecified: Secondary | ICD-10-CM | POA: Diagnosis not present

## 2012-10-23 DIAGNOSIS — E119 Type 2 diabetes mellitus without complications: Secondary | ICD-10-CM | POA: Diagnosis not present

## 2012-10-23 DIAGNOSIS — I10 Essential (primary) hypertension: Secondary | ICD-10-CM | POA: Diagnosis not present

## 2012-10-23 DIAGNOSIS — I5022 Chronic systolic (congestive) heart failure: Secondary | ICD-10-CM | POA: Diagnosis not present

## 2012-11-03 DIAGNOSIS — Z01810 Encounter for preprocedural cardiovascular examination: Secondary | ICD-10-CM | POA: Diagnosis not present

## 2012-11-03 DIAGNOSIS — I5022 Chronic systolic (congestive) heart failure: Secondary | ICD-10-CM | POA: Diagnosis not present

## 2012-11-03 DIAGNOSIS — I10 Essential (primary) hypertension: Secondary | ICD-10-CM | POA: Diagnosis not present

## 2012-11-05 DIAGNOSIS — M1710 Unilateral primary osteoarthritis, unspecified knee: Secondary | ICD-10-CM | POA: Diagnosis not present

## 2013-01-04 ENCOUNTER — Encounter (HOSPITAL_COMMUNITY): Payer: Self-pay | Admitting: Anesthesiology

## 2013-01-04 ENCOUNTER — Emergency Department (HOSPITAL_COMMUNITY): Payer: Medicare Other

## 2013-01-04 ENCOUNTER — Encounter (HOSPITAL_COMMUNITY): Payer: Self-pay | Admitting: *Deleted

## 2013-01-04 ENCOUNTER — Observation Stay (HOSPITAL_COMMUNITY): Payer: Medicare Other | Admitting: Anesthesiology

## 2013-01-04 ENCOUNTER — Observation Stay (HOSPITAL_COMMUNITY): Payer: Medicare Other

## 2013-01-04 ENCOUNTER — Encounter (HOSPITAL_COMMUNITY)
Admission: EM | Disposition: A | Discharge status: Home / Self Care | Payer: Self-pay | Source: Home / Self Care | Attending: Emergency Medicine

## 2013-01-04 ENCOUNTER — Observation Stay (HOSPITAL_COMMUNITY)
Admission: EM | Admit: 2013-01-04 | Discharge: 2013-01-05 | Disposition: A | Discharge status: Home / Self Care | Payer: Medicare Other | Attending: Urology | Admitting: Urology

## 2013-01-04 DIAGNOSIS — Z01818 Encounter for other preprocedural examination: Secondary | ICD-10-CM | POA: Diagnosis not present

## 2013-01-04 DIAGNOSIS — R110 Nausea: Secondary | ICD-10-CM | POA: Diagnosis not present

## 2013-01-04 DIAGNOSIS — I219 Acute myocardial infarction, unspecified: Secondary | ICD-10-CM | POA: Diagnosis not present

## 2013-01-04 DIAGNOSIS — N201 Calculus of ureter: Principal | ICD-10-CM | POA: Insufficient documentation

## 2013-01-04 DIAGNOSIS — Z7982 Long term (current) use of aspirin: Secondary | ICD-10-CM | POA: Insufficient documentation

## 2013-01-04 DIAGNOSIS — Z6841 Body Mass Index (BMI) 40.0 and over, adult: Secondary | ICD-10-CM | POA: Diagnosis not present

## 2013-01-04 DIAGNOSIS — E119 Type 2 diabetes mellitus without complications: Secondary | ICD-10-CM | POA: Diagnosis not present

## 2013-01-04 DIAGNOSIS — Z79899 Other long term (current) drug therapy: Secondary | ICD-10-CM | POA: Diagnosis not present

## 2013-01-04 DIAGNOSIS — I10 Essential (primary) hypertension: Secondary | ICD-10-CM | POA: Insufficient documentation

## 2013-01-04 DIAGNOSIS — G4730 Sleep apnea, unspecified: Secondary | ICD-10-CM | POA: Diagnosis not present

## 2013-01-04 DIAGNOSIS — R1012 Left upper quadrant pain: Secondary | ICD-10-CM | POA: Diagnosis not present

## 2013-01-04 DIAGNOSIS — K219 Gastro-esophageal reflux disease without esophagitis: Secondary | ICD-10-CM | POA: Diagnosis not present

## 2013-01-04 DIAGNOSIS — K2990 Gastroduodenitis, unspecified, without bleeding: Secondary | ICD-10-CM | POA: Diagnosis not present

## 2013-01-04 DIAGNOSIS — R112 Nausea with vomiting, unspecified: Secondary | ICD-10-CM | POA: Diagnosis not present

## 2013-01-04 DIAGNOSIS — N200 Calculus of kidney: Secondary | ICD-10-CM | POA: Diagnosis not present

## 2013-01-04 HISTORY — DX: Sleep apnea, unspecified: G47.30

## 2013-01-04 HISTORY — DX: Cardiac murmur, unspecified: R01.1

## 2013-01-04 HISTORY — DX: Acute myocardial infarction, unspecified: I21.9

## 2013-01-04 HISTORY — DX: Unspecified osteoarthritis, unspecified site: M19.90

## 2013-01-04 HISTORY — DX: Gastro-esophageal reflux disease without esophagitis: K21.9

## 2013-01-04 HISTORY — PX: CYSTOSCOPY W/ URETERAL STENT PLACEMENT: SHX1429

## 2013-01-04 LAB — URINALYSIS, MICROSCOPIC ONLY
Bilirubin Urine: NEGATIVE
Glucose, UA: 250 mg/dL — AB
Ketones, ur: NEGATIVE mg/dL
Leukocytes, UA: NEGATIVE
Protein, ur: NEGATIVE mg/dL
pH: 6.5 (ref 5.0–8.0)

## 2013-01-04 LAB — CBC WITH DIFFERENTIAL/PLATELET
Basophils Relative: 0 % (ref 0–1)
Hemoglobin: 11.2 g/dL — ABNORMAL LOW (ref 12.0–15.0)
Lymphs Abs: 1.2 10*3/uL (ref 0.7–4.0)
Monocytes Relative: 6 % (ref 3–12)
Neutro Abs: 12.1 10*3/uL — ABNORMAL HIGH (ref 1.7–7.7)
Neutrophils Relative %: 85 % — ABNORMAL HIGH (ref 43–77)
Platelets: 286 10*3/uL (ref 150–400)
RBC: 4.24 MIL/uL (ref 3.87–5.11)

## 2013-01-04 LAB — COMPREHENSIVE METABOLIC PANEL
ALT: 15 U/L (ref 0–35)
Albumin: 3.6 g/dL (ref 3.5–5.2)
Alkaline Phosphatase: 126 U/L — ABNORMAL HIGH (ref 39–117)
BUN: 11 mg/dL (ref 6–23)
Chloride: 103 mEq/L (ref 96–112)
Glucose, Bld: 249 mg/dL — ABNORMAL HIGH (ref 70–99)
Potassium: 3.6 mEq/L (ref 3.5–5.1)
Sodium: 139 mEq/L (ref 135–145)
Total Bilirubin: 0.6 mg/dL (ref 0.3–1.2)
Total Protein: 7.7 g/dL (ref 6.0–8.3)

## 2013-01-04 LAB — POCT I-STAT TROPONIN I: Troponin i, poc: 0 ng/mL (ref 0.00–0.08)

## 2013-01-04 LAB — SURGICAL PCR SCREEN
MRSA, PCR: NEGATIVE
Staphylococcus aureus: POSITIVE — AB

## 2013-01-04 LAB — GLUCOSE, CAPILLARY: Glucose-Capillary: 226 mg/dL — ABNORMAL HIGH (ref 70–99)

## 2013-01-04 SURGERY — CYSTOSCOPY, WITH RETROGRADE PYELOGRAM AND URETERAL STENT INSERTION
Anesthesia: General | Site: Ureter | Laterality: Left | Wound class: Clean Contaminated

## 2013-01-04 MED ORDER — FENTANYL CITRATE 0.05 MG/ML IJ SOLN
INTRAMUSCULAR | Status: DC | PRN
  Administered 2013-01-04: 100 ug via INTRAVENOUS
  Administered 2013-01-04: 50 ug via INTRAVENOUS
  Administered 2013-01-04: 25 ug via INTRAVENOUS

## 2013-01-04 MED ORDER — LIDOCAINE HCL (CARDIAC) 20 MG/ML IV SOLN
INTRAVENOUS | Status: DC | PRN
  Administered 2013-01-04: 30 mg via INTRAVENOUS

## 2013-01-04 MED ORDER — HYDROMORPHONE HCL PF 1 MG/ML IJ SOLN: 1.0000 mg | INTRAMUSCULAR | Status: DC | PRN

## 2013-01-04 MED ORDER — SODIUM CHLORIDE 0.9 % IV SOLN
INTRAVENOUS | Status: DC
  Administered 2013-01-04: 05:00:00 via INTRAVENOUS

## 2013-01-04 MED ORDER — LIDOCAINE HCL 2 % EX GEL
CUTANEOUS | Status: DC | PRN
  Administered 2013-01-04: 1 via URETHRAL

## 2013-01-04 MED ORDER — HYDROMORPHONE HCL PF 1 MG/ML IJ SOLN
1.0000 mg | INTRAMUSCULAR | Status: DC | PRN
  Filled 2013-01-04: qty 1

## 2013-01-04 MED ORDER — METOPROLOL SUCCINATE ER 100 MG PO TB24
100.0000 mg | ORAL_TABLET | Freq: Every day | ORAL | Status: DC
  Administered 2013-01-04: 100 mg via ORAL
  Filled 2013-01-04: qty 1

## 2013-01-04 MED ORDER — ONDANSETRON HCL 4 MG/2ML IJ SOLN: 4.0000 mg | Freq: Three times a day (TID) | INTRAMUSCULAR | Status: DC | PRN

## 2013-01-04 MED ORDER — PROPOFOL 10 MG/ML IV EMUL
INTRAVENOUS | Status: DC | PRN
  Administered 2013-01-04: 50 mg via INTRAVENOUS
  Administered 2013-01-04: 200 mg via INTRAVENOUS

## 2013-01-04 MED ORDER — FUROSEMIDE 40 MG PO TABS
40.0000 mg | ORAL_TABLET | Freq: Every day | ORAL | Status: DC
  Administered 2013-01-04: 40 mg via ORAL
  Filled 2013-01-04: qty 1

## 2013-01-04 MED ORDER — METOPROLOL SUCCINATE ER 100 MG PO TB24: 100.0000 mg | ORAL_TABLET | Freq: Every day | ORAL | Status: DC

## 2013-01-04 MED ORDER — ONDANSETRON HCL 4 MG/2ML IJ SOLN
4.0000 mg | INTRAMUSCULAR | Status: DC | PRN
  Administered 2013-01-04: 4 mg via INTRAVENOUS
  Filled 2013-01-04: qty 2

## 2013-01-04 MED ORDER — ONDANSETRON HCL 4 MG/2ML IJ SOLN
4.0000 mg | Freq: Once | INTRAMUSCULAR | Status: AC
  Administered 2013-01-04: 4 mg via INTRAVENOUS
  Filled 2013-01-04: qty 2

## 2013-01-04 MED ORDER — PROMETHAZINE HCL 25 MG/ML IJ SOLN: 6.2500 mg | INTRAMUSCULAR | Status: DC | PRN

## 2013-01-04 MED ORDER — GLIPIZIDE 5 MG PO TABS
5.0000 mg | ORAL_TABLET | Freq: Every day | ORAL | Status: DC
  Administered 2013-01-04: 5 mg via ORAL
  Filled 2013-01-04 (×2): qty 1

## 2013-01-04 MED ORDER — PANTOPRAZOLE SODIUM 40 MG PO TBEC
40.0000 mg | DELAYED_RELEASE_TABLET | Freq: Every day | ORAL | Status: DC
  Administered 2013-01-04: 40 mg via ORAL
  Filled 2013-01-04: qty 1

## 2013-01-04 MED ORDER — CEFAZOLIN SODIUM 10 G IJ SOLR
3.0000 g | INTRAMUSCULAR | Status: AC
  Administered 2013-01-04: 3 g via INTRAVENOUS
  Filled 2013-01-04: qty 3000

## 2013-01-04 MED ORDER — HYDROMORPHONE HCL PF 1 MG/ML IJ SOLN
1.0000 mg | Freq: Once | INTRAMUSCULAR | Status: AC
  Administered 2013-01-04: 1 mg via INTRAVENOUS

## 2013-01-04 MED ORDER — LACTATED RINGERS IV SOLN
INTRAVENOUS | Status: DC | PRN
  Administered 2013-01-04: 16:00:00 via INTRAVENOUS

## 2013-01-04 MED ORDER — ONDANSETRON HCL 4 MG/2ML IJ SOLN
INTRAMUSCULAR | Status: DC | PRN
  Administered 2013-01-04 (×2): 2 mg via INTRAVENOUS

## 2013-01-04 MED ORDER — KETOROLAC TROMETHAMINE 30 MG/ML IJ SOLN
30.0000 mg | Freq: Once | INTRAMUSCULAR | Status: AC
  Administered 2013-01-04: 30 mg via INTRAVENOUS
  Filled 2013-01-04: qty 1

## 2013-01-04 MED ORDER — METRONIDAZOLE IN NACL 5-0.79 MG/ML-% IV SOLN
500.0000 mg | Freq: Once | INTRAVENOUS | Status: DC
  Filled 2013-01-04: qty 100

## 2013-01-04 MED ORDER — HYDROMORPHONE HCL PF 1 MG/ML IJ SOLN
1.0000 mg | Freq: Once | INTRAMUSCULAR | Status: AC
  Administered 2013-01-04: 1 mg via INTRAVENOUS
  Filled 2013-01-04: qty 1

## 2013-01-04 MED ORDER — FENTANYL CITRATE 0.05 MG/ML IJ SOLN
50.0000 ug | INTRAMUSCULAR | Status: DC | PRN
  Administered 2013-01-04: 50 ug via INTRAVENOUS
  Filled 2013-01-04: qty 2

## 2013-01-04 MED ORDER — ONDANSETRON 4 MG PO TBDP
8.0000 mg | ORAL_TABLET | Freq: Once | ORAL | Status: AC
  Administered 2013-01-04: 8 mg via ORAL
  Filled 2013-01-04: qty 2

## 2013-01-04 MED ORDER — IOHEXOL 300 MG/ML  SOLN
INTRAMUSCULAR | Status: DC | PRN
  Administered 2013-01-04: 10 mL via INTRAVENOUS

## 2013-01-04 MED ORDER — OXYBUTYNIN CHLORIDE ER 5 MG PO TB24
5.0000 mg | ORAL_TABLET | Freq: Every day | ORAL | Status: DC
  Administered 2013-01-04: 5 mg via ORAL
  Filled 2013-01-04: qty 1

## 2013-01-04 MED ORDER — LIDOCAINE HCL 2 % EX GEL
CUTANEOUS | Status: DC | PRN
  Administered 2013-01-04: 1

## 2013-01-04 MED ORDER — CIPROFLOXACIN IN D5W 400 MG/200ML IV SOLN
400.0000 mg | Freq: Once | INTRAVENOUS | Status: DC
  Filled 2013-01-04: qty 200

## 2013-01-04 MED ORDER — BIMATOPROST 0.03 % OP SOLN
1.0000 [drp] | Freq: Every day | OPHTHALMIC | Status: DC
  Filled 2013-01-04: qty 2.5

## 2013-01-04 MED ORDER — LACTATED RINGERS IV SOLN: INTRAVENOUS | Status: DC

## 2013-01-04 MED ORDER — ACETAMINOPHEN 10 MG/ML IV SOLN
INTRAVENOUS | Status: DC | PRN
  Administered 2013-01-04: 1000 mg via INTRAVENOUS

## 2013-01-04 MED ORDER — SODIUM CHLORIDE 0.9 % IR SOLN
Status: DC | PRN
  Administered 2013-01-04: 3000 mL

## 2013-01-04 MED ORDER — BIMATOPROST 0.01 % OP SOLN
1.0000 [drp] | Freq: Every day | OPHTHALMIC | Status: DC
  Filled 2013-01-04: qty 2.5

## 2013-01-04 MED ORDER — FENTANYL CITRATE 0.05 MG/ML IJ SOLN: 25.0000 ug | INTRAMUSCULAR | Status: DC | PRN

## 2013-01-04 MED ORDER — LISINOPRIL 20 MG PO TABS
20.0000 mg | ORAL_TABLET | Freq: Every day | ORAL | Status: DC
  Administered 2013-01-04: 20 mg via ORAL
  Filled 2013-01-04: qty 1

## 2013-01-04 MED ORDER — FENTANYL CITRATE 0.05 MG/ML IJ SOLN
50.0000 ug | INTRAMUSCULAR | Status: DC | PRN
  Administered 2013-01-04: 05:00:00 via INTRAVENOUS
  Filled 2013-01-04: qty 2

## 2013-01-04 SURGICAL SUPPLY — 17 items
ADAPTER CATH URET PLST 4-6FR (CATHETERS) ×2 IMPLANT
ADPR CATH URET STRL DISP 4-6FR (CATHETERS) ×1
BAG URO CATCHER STRL LF (DRAPE) ×2 IMPLANT
CATH URET 5FR 28IN CONE TIP (BALLOONS) ×1
CATH URET 5FR 70CM CONE TIP (BALLOONS) IMPLANT
CATH URET WHISTLE 5FR 28IN (CATHETERS) IMPLANT
CATH URET WHISTLE 6FR (CATHETERS) IMPLANT
CLOTH BEACON ORANGE TIMEOUT ST (SAFETY) ×2 IMPLANT
GLOVE BIOGEL M STRL SZ7.5 (GLOVE) ×2 IMPLANT
GOWN PREVENTION PLUS XLARGE (GOWN DISPOSABLE) ×1 IMPLANT
GOWN STRL NON-REIN LRG LVL3 (GOWN DISPOSABLE) ×2 IMPLANT
GOWN STRL REIN XL XLG (GOWN DISPOSABLE) ×4 IMPLANT
MANIFOLD NEPTUNE II (INSTRUMENTS) ×2 IMPLANT
PACK CYSTO (CUSTOM PROCEDURE TRAY) ×2 IMPLANT
STENT CONTOUR 6FRX24X.038 (STENTS) ×1 IMPLANT
TUBING CONNECTING 10 (TUBING) ×2 IMPLANT
WIRE COONS/BENSON .038X145CM (WIRE) ×2 IMPLANT

## 2013-01-04 NOTE — ED Provider Notes (Signed)
History     CSN: [id number]  Arrival date & time 01/04/13  0251   First MD Initiated Contact with Patient 01/04/13 [phone]      Chief Complaint  Patient presents with  . Nausea  . Emesis  . Abdominal Pain    (Consider location/radiation/quality/duration/timing/severity/associated sxs/prior treatment) HPI History provided by patient. Left lower quadrant abdominal pain started last night, worsening, sharp in quality and not radiating. Has associated nausea vomiting. Complains of chills but no recorded fevers. Remote history of colonoscopy but no known history of diverticulitis or diverticulosis. No blood in stools. No diarrhea. No history of same. Pain worse with palpation otherwise no known aggravating or alleviating factors. No dysuria or hematuria. Patient feels generalized weakness with her symptoms tonight and having difficulty getting it to the bathroom.  Past Medical History  Diagnosis Date  . Diabetes mellitus   . Hypertension   . Cardiac arrest     Past Surgical History  Procedure Laterality Date  . Back surgery    . Partial hysterectomy    . Knee surgery      left  . Hernia repair    . Foot surgery      Family History  Problem Relation Age of Onset  . Asthma Mother     History  Substance Use Topics  . Smoking status: Never Smoker   . Smokeless tobacco: Never Used  . Alcohol Use: No    OB History   Grav Para Term Preterm Abortions TAB SAB Ect Mult Living                  Review of Systems  Constitutional: Positive for chills. Negative for fever.  HENT: Negative for neck pain and neck stiffness.   Eyes: Negative for pain.  Respiratory: Negative for shortness of breath.   Cardiovascular: Negative for chest pain.  Gastrointestinal: Positive for nausea, vomiting and abdominal pain. Negative for blood in stool.  Genitourinary: Negative for dysuria.  Musculoskeletal: Negative for back pain.  Skin: Negative for rash.  Neurological: Negative for headaches.   All other systems reviewed and are negative.    Allergies  Contrast media  Home Medications   Current Outpatient Rx  Name  Route  Sig  Dispense  Refill  . acetaminophen (TYLENOL) 500 MG tablet   Oral   Take 500 mg by mouth every 6 (six) hours as needed. For pain         . aspirin EC 81 MG tablet   Oral   Take 81 mg by mouth daily.         . bimatoprost (LUMIGAN) 0.03 % ophthalmic solution   Both Eyes   Place 1 drop into both eyes at bedtime.         Marland Kitchen glipiZIDE (GLUCOTROL) 5 MG tablet   Oral   Take 5 mg by mouth daily.          Marland Kitchen lisinopril (PRINIVIL,ZESTRIL) 20 MG tablet   Oral   Take 20 mg by mouth daily.         . metoprolol succinate (TOPROL-XL) 100 MG 24 hr tablet   Oral   Take 100 mg by mouth daily. Take with or immediately following a meal.         . oxybutynin (DITROPAN-XL) 5 MG 24 hr tablet   Oral   Take 5 mg by mouth daily.         . pravastatin (PRAVACHOL) 40 MG tablet   Oral   Take 40  mg by mouth daily.         . traMADol (ULTRAM) 50 MG tablet   Oral   Take 50 mg by mouth every 6 (six) hours as needed. For pain         . EXPIRED: furosemide (LASIX) 40 MG tablet   Oral   Take 1 tablet (40 mg total) by mouth daily.   30 tablet   0   . EXPIRED: metoprolol succinate (TOPROL-XL) 100 MG 24 hr tablet   Oral   Take 1 tablet (100 mg total) by mouth daily. Take with or immediately following a meal.   30 tablet   0   . EXPIRED: pantoprazole (PROTONIX) 40 MG tablet   Oral   Take 1 tablet (40 mg total) by mouth daily at 12 noon.   30 tablet   0     BP 135/97  Pulse 70  Temp(Src) 97.6 F (36.4 C) (Oral)  Resp 28  SpO2 97%  Physical Exam  Constitutional: She is oriented to person, place, and time. She appears well-developed and well-nourished.  HENT:  Head: Normocephalic and atraumatic.  Mouth/Throat: Oropharynx is clear and moist.  Eyes: EOM are normal. Pupils are equal, round, and reactive to light. No scleral icterus.   Neck: Neck supple.  Cardiovascular: Normal rate, regular rhythm and intact distal pulses.   Pulmonary/Chest: Effort normal and breath sounds normal. No respiratory distress.  Abdominal:  Obese body habitus, soft, tender to palpation left lower quadrant without rebound or guarding  Musculoskeletal: Normal range of motion. She exhibits no tenderness.  Neurological: She is alert and oriented to person, place, and time.  Skin: Skin is warm and dry.    ED Course  Procedures (including critical care time)  Results for orders placed during the hospital encounter of 01/04/13  CBC WITH DIFFERENTIAL      Result Value Range   WBC 14.3 (*) 4.0 - 10.5 K/uL   RBC 4.24  3.87 - 5.11 MIL/uL   Hemoglobin 11.2 (*) 12.0 - 15.0 g/dL   HCT 82.9 (*) 56.2 - 13.0 %   MCV 80.2  78.0 - 100.0 fL   MCH 26.4  26.0 - 34.0 pg   MCHC 32.9  30.0 - 36.0 g/dL   RDW 86.5  78.4 - 69.6 %   Platelets 286  150 - 400 K/uL   Neutrophils Relative 85 (*) 43 - 77 %   Neutro Abs 12.1 (*) 1.7 - 7.7 K/uL   Lymphocytes Relative 9 (*) 12 - 46 %   Lymphs Abs 1.2  0.7 - 4.0 K/uL   Monocytes Relative 6  3 - 12 %   Monocytes Absolute 0.9  0.1 - 1.0 K/uL   Eosinophils Relative 0  0 - 5 %   Eosinophils Absolute 0.0  0.0 - 0.7 K/uL   Basophils Relative 0  0 - 1 %   Basophils Absolute 0.0  0.0 - 0.1 K/uL  COMPREHENSIVE METABOLIC PANEL      Result Value Range   Sodium 139  135 - 145 mEq/L   Potassium 3.6  3.5 - 5.1 mEq/L   Chloride 103  96 - 112 mEq/L   CO2 26  19 - 32 mEq/L   Glucose, Bld 249 (*) 70 - 99 mg/dL   BUN 11  6 - 23 mg/dL   Creatinine, Ser 2.95  0.50 - 1.10 mg/dL   Calcium 9.3  8.4 - 28.4 mg/dL   Total Protein 7.7  6.0 - 8.3 g/dL  Albumin 3.6  3.5 - 5.2 g/dL   AST 17  0 - 37 U/L   ALT 15  0 - 35 U/L   Alkaline Phosphatase 126 (*) 39 - 117 U/L   Total Bilirubin 0.6  0.3 - 1.2 mg/dL   GFR calc non Af Amer >90  >90 mL/min   GFR calc Af Amer >90  >90 mL/min  URINALYSIS, MICROSCOPIC ONLY      Result Value Range    Color, Urine YELLOW  YELLOW   APPearance CLEAR  CLEAR   Specific Gravity, Urine 1.019  1.005 - 1.030   pH 6.5  5.0 - 8.0   Glucose, UA 250 (*) NEGATIVE mg/dL   Hgb urine dipstick SMALL (*) NEGATIVE   Bilirubin Urine NEGATIVE  NEGATIVE   Ketones, ur NEGATIVE  NEGATIVE mg/dL   Protein, ur NEGATIVE  NEGATIVE mg/dL   Urobilinogen, UA 0.2  0.0 - 1.0 mg/dL   Nitrite NEGATIVE  NEGATIVE   Leukocytes, UA NEGATIVE  NEGATIVE   WBC, UA 0-2  <3 WBC/hpf   RBC / HPF 3-6  <3 RBC/hpf   Bacteria, UA FEW (*) RARE   Squamous Epithelial / LPF FEW (*) RARE  POCT I-STAT TROPONIN I      Result Value Range   Troponin i, poc 0.00  0.00 - 0.08 ng/mL   Comment 3            Ct Abdomen Pelvis Wo Contrast  01/04/2013  *RADIOLOGY REPORT*  Clinical Data: Left lower quadrant pain and left flank pain. Nausea and vomiting.History of contrast reaction.  No IV contrast material given.  CT ABDOMEN AND PELVIS WITHOUT CONTRAST  Technique:  Multidetector CT imaging of the abdomen and pelvis was performed following the standard protocol without intravenous contrast.  Comparison: 07/04/2004  Findings: The lung bases are clear.  There is a 6 mm stone in the proximal left ureter at the ureteropelvic junction.  There is proximal pyelocaliectasis and pararenal stranding consistent with moderate obstruction.  Multiple stones are demonstrated in the lower pole calyces on the left without additional obstruction.  The distal ureter is decompressed. No renal or ureteral stones demonstrated on the right.  No bladder stone or bladder wall thickening.  The unenhanced appearance of the liver, spleen, gallbladder, pancreas, adrenal glands, abdominal aorta, inferior vena cava, and retroperitoneal lymph nodes is unremarkable.  The stomach and small bowel are mostly decompressed.  Stool filled colon without abnormal distension.  Mildly prominent visceral adipose tissues.  No free air or free fluid in the abdomen.  Scarring in the anterior abdominal  wall with interval repair of previously demonstrated abdominal wall hernia.  Pelvis:  The uterus appears to be surgically absent.  No abnormal adnexal masses.  The appendix is not identified.  No free or loculated pelvic fluid collections.  No significant pelvic lymphadenopathy.  Mild degenerative changes in the lumbar spine.  IMPRESSION: 6 mm stone in the left ureteropelvic junction with moderate proximal obstruction.  Multiple additional stones in the lower pole calix on the left.   Original Report Authenticated By: Burman Nieves, M.D.      Date: 01/04/2013  Rate: 65  Rhythm: normal sinus rhythm  QRS Axis: normal  Intervals: normal  ST/T Wave abnormalities: nonspecific ST changes  Conduction Disutrbances:none  Narrative Interpretation: NSR  With LVH  Old EKG Reviewed: unchanged  6:49 AM still having severe unchanged pain after IV narcotics multiple rounds of Zofran. Discussed with urology, Dr. Isabel Caprice on call. will try Toradol and  if no relief, will transfer to [facility] to be evaluated by urology for possible stent placement.  I discussed plan with patient, despite pain medication she is still having significant pain and nausea vomiting. Plan transfer to [facility] long  MDM  Abdominal pain nausea vomiting with 6 mm left UVJ stone  Labs. CT. EKG. IV narcotics. Multiple rounds of IV Zofran. IV Toradol and remains symptomatic  Transfer and admit to urology        Sunnie Nielsen, MD 01/04/13 [phone]

## 2013-01-04 NOTE — Anesthesia Postprocedure Evaluation (Signed)
Anesthesia Post Note  Patient: Whitney [name]  Procedure(s) Performed: Procedure(s) (LRB): CYSTOSCOPY WITH RETROGRADE PYELOGRAM/URETERAL STENT PLACEMENT (Left)  Anesthesia type: General  Patient location: PACU  Post pain: Pain level controlled  Post assessment: Post-op Vital signs reviewed  Last Vitals:  Filed Vitals:   01/04/13 2210  BP: 129/63  Pulse: 62  Temp: 36.3 C  Resp: 16    Post vital signs: Reviewed  Level of consciousness: sedated  Complications: No apparent anesthesia complications

## 2013-01-04 NOTE — ED Notes (Signed)
Pt. Refused rectal temp.   

## 2013-01-04 NOTE — Transfer of Care (Signed)
Immediate Anesthesia Transfer of Care Note  Patient: Whitney [name]  Procedure(s) Performed: Procedure(s): CYSTOSCOPY WITH RETROGRADE PYELOGRAM/URETERAL STENT PLACEMENT (Left)  Patient Location: PACU  Anesthesia Type:General  Level of Consciousness: awake, alert  and patient cooperative  Airway & Oxygen Therapy: Patient connected to face mask oxygen  Post-op Assessment: Report given to PACU RN and Post -op Vital signs reviewed and stable  Post vital signs: stable  Complications: No apparent anesthesia complications

## 2013-01-04 NOTE — Progress Notes (Signed)
Pt seen and was offered assistance with her cpap unit/full face mask from home.  Pt placed cpap on herself and is tolerating well at this time.  Sterile water added to humidity chamber.  Cpap unit appears to be working fine at this time, no frays on cord or defects noted.  Service call made for [employer] to inspect patient's machine.  Pt was advised that RT available available all night should she need further assistance.

## 2013-01-04 NOTE — Op Note (Signed)
Preoperative diagnosis: Left proximal ureteral calculus Postoperative diagnosis: Same  Procedure: Cystoscopy, left retrograde pyelogram left double-J stent placement 6 French x24 cm   Surgeon: Valetta Fuller M.D.  Anesthesia: Gen.  Indications: Ms. hour presented [facility] with left flank pain. She was diagnosed with a 6 mm proximal left ureteral stone with obstruction and some additional stones in the lower pole calyx. There were unable to manage her pain so I requested that she be transferred to [facility]. She was admitted and now presents for relief of the obstruction via double-J stent placement. I felt that given the weekend and the fact that she has a very proximal stone with multiple medical core morbidities of morbid obesity it would be best just to decompress her system and definitively manage stone in the later date.     Technique and findings: Patient was brought the operative room she is successful induction of general anesthesia. She was placed in lithotomy position prepped and draped in usual manner. She is had SCD boots and also received perioperative antibiotics. Appropriate surgical timeout was performed. Cystoscopy revealed unremarkable bladder. The left ureteral orifice was cannulated with a 5 French open tip catheter. Contrast was injected and a filling defect was noted in the proximal ureter at approximately the L2-L3 level. There was high-grade obstruction and a filling defect consistent with a 6 mm stone. Guidewire was placed beyond the stone to the renal pelvis. Over the guidewire we placed a 6 French 24 cm double-J stent without difficulty utilizing fluoroscopy as well as visual guidance. Patient was brought to recovery room in stable condition there

## 2013-01-04 NOTE — Anesthesia Preprocedure Evaluation (Addendum)
Anesthesia Evaluation  Patient identified by MRN, date of birth, ID band Patient awake    Reviewed: Allergy & Precautions, H&P , NPO status , Patient's Chart, lab work & pertinent test results  Airway Mallampati: III TM Distance: >3 FB Neck ROM: Full    Dental  (+) Teeth Intact and Dental Advisory Given   Pulmonary sleep apnea ,  breath sounds clear to auscultation  Pulmonary exam normal       Cardiovascular hypertension, Pt. on medications and Pt. on home beta blockers + Past MI and +CHF + dysrhythmias + Valvular Problems/Murmurs Rhythm:Regular Rate:Normal  Study Conclusions  - Left ventricle: The cavity size was normal. There was   moderate concentric hypertrophy. Systolic function was   moderately to severely reduced. The estimated ejection   fraction was in the range of 30% to 35%. - Right ventricle: The cavity size was mildly dilated. Wall   thickness was normal. - Right atrium: The atrium was mildly dilated. Transthoracic echocardiography.  M-mode, complete 2D, spectral Doppler, and color Doppler.  Height:  Height: 160cm. Height: 63in.  Weight:  Weight: 149.7kg. Weight: 329.3lb.  Body mass index:  BMI: 58.5kg/m^2.  Body surface area:    BSA: 2.29m^2.  Blood pressure:     128/98.  Patient status:  Inpatient.  Location:  Emergency department.     Neuro/Psych negative neurological ROS  negative psych ROS   GI/Hepatic Neg liver ROS, GERD-  ,  Endo/Other  negative endocrine ROSdiabetes, Type 2, Oral Hypoglycemic AgentsMorbid obesity  Renal/GU negative Renal ROS  negative genitourinary   Musculoskeletal negative musculoskeletal ROS (+)   Abdominal (+) + obese,   Peds  Hematology negative hematology ROS (+)   Anesthesia Other Findings   Reproductive/Obstetrics                         Anesthesia Physical Anesthesia Plan  ASA: III and emergent  Anesthesia Plan: General   Post-op Pain  Management:    Induction: Intravenous  Airway Management Planned: LMA  Additional Equipment:   Intra-op Plan:   Post-operative Plan: Extubation in OR  Informed Consent: I have reviewed the patients History and Physical, chart, labs and discussed the procedure including the risks, benefits and alternatives for the proposed anesthesia with the patient or authorized representative who has indicated his/her understanding and acceptance.   Dental advisory given  Plan Discussed with: CRNA  Anesthesia Plan Comments:        Anesthesia Quick Evaluation

## 2013-01-04 NOTE — ED Notes (Signed)
Per EMS:  Pt. Had LLQ pain and left flank pain starting 7 hours ago.  N/V started 1 hour ago.  Denies diarrhea.  Vomited before EMS and when EMS arrived.  Cardiac arrest Feb 2013.  Hx of HTN and non insulin dependent Diabetes Mellitus Type II.

## 2013-01-04 NOTE — H&P (Signed)
Whitney [name] is an 57 y.o. female.   Chief Complaint: Left flank pain with newly diagnosed 6 mm left proximal ureteral stone. HPI: Whitney [name] denies prior urologic history. She does have a number of significant medical core morbidities including morbid obesity, type 2 diabetes mellitus, history of cardiac arrest without apparent coronary vascular disease, severe sleep apnea who presented with left-sided abdominal and flank discomfort with associated nausea. She was in the ER for hours attempting to control her pain. We were contacted by the ER physicians at [facility] and told that this patient was not been available to go home for pain management. He requested that she be transferred to [facility] long for consideration of surgical intervention. The patient denies prior episodes of nephrolithiasis. CT shows a 6 mm stone in the proximal left ureter. There is dilation was some perirenal stranding consistent with moderate obstruction and additional stones noted in the lower pole. Her pain at this time is a 7/10. Her white blood cell count is elevated slightly although her urine does not show any evidence of obvious cystitis and she certainly does not have clinical evidence of urosepsis at this point.  Past Medical History  Diagnosis Date  . Diabetes mellitus   . Hypertension   . Cardiac arrest   . Myocardial infarction   . Sleep apnea   . Heart murmur   . GERD (gastroesophageal reflux disease)   . Arthritis     Past Surgical History  Procedure Laterality Date  . Back surgery    . Partial hysterectomy    . Knee surgery      left  . Hernia repair    . Foot surgery      Family History  Problem Relation Age of Onset  . Asthma Mother    Social History:  reports that she has never smoked. She has never used smokeless tobacco. She reports that she does not drink alcohol or use illicit drugs.  Allergies:  Allergies  Allergen Reactions  . Contrast Media (Iodinated Diagnostic Agents)  Anaphylaxis    Medications Prior to Admission  Medication Sig Dispense Refill  . acetaminophen (TYLENOL) 500 MG tablet Take 500 mg by mouth every 6 (six) hours as needed. For pain      . aspirin EC 81 MG tablet Take 81 mg by mouth daily.      . bimatoprost (LUMIGAN) 0.03 % ophthalmic solution Place 1 drop into both eyes at bedtime.      Marland Kitchen glipiZIDE (GLUCOTROL) 5 MG tablet Take 5 mg by mouth daily.       Marland Kitchen lisinopril (PRINIVIL,ZESTRIL) 20 MG tablet Take 20 mg by mouth daily.      . metoprolol succinate (TOPROL-XL) 100 MG 24 hr tablet Take 100 mg by mouth daily. Take with or immediately following a meal.      . oxybutynin (DITROPAN-XL) 5 MG 24 hr tablet Take 5 mg by mouth daily.      . pravastatin (PRAVACHOL) 40 MG tablet Take 40 mg by mouth daily.      . traMADol (ULTRAM) 50 MG tablet Take 50 mg by mouth every 6 (six) hours as needed. For pain      . furosemide (LASIX) 40 MG tablet Take 1 tablet (40 mg total) by mouth daily.  30 tablet  0  . metoprolol succinate (TOPROL-XL) 100 MG 24 hr tablet Take 1 tablet (100 mg total) by mouth daily. Take with or immediately following a meal.  30 tablet  0  .  pantoprazole (PROTONIX) 40 MG tablet Take 1 tablet (40 mg total) by mouth daily at 12 noon.  30 tablet  0    Results for orders placed during the hospital encounter of 01/04/13 (from the past 48 hour(s))  URINALYSIS, MICROSCOPIC ONLY     Status: Abnormal   Collection Time    01/04/13  3:18 AM      Result Value Range   Color, Urine YELLOW  YELLOW   APPearance CLEAR  CLEAR   Specific Gravity, Urine 1.019  1.005 - 1.030   pH 6.5  5.0 - 8.0   Glucose, UA 250 (*) NEGATIVE mg/dL   Hgb urine dipstick SMALL (*) NEGATIVE   Bilirubin Urine NEGATIVE  NEGATIVE   Ketones, ur NEGATIVE  NEGATIVE mg/dL   Protein, ur NEGATIVE  NEGATIVE mg/dL   Urobilinogen, UA 0.2  0.0 - 1.0 mg/dL   Nitrite NEGATIVE  NEGATIVE   Leukocytes, UA NEGATIVE  NEGATIVE   WBC, UA 0-2  <3 WBC/hpf   RBC / HPF 3-6  <3 RBC/hpf    Bacteria, UA FEW (*) RARE   Squamous Epithelial / LPF FEW (*) RARE  CBC WITH DIFFERENTIAL     Status: Abnormal   Collection Time    01/04/13  3:24 AM      Result Value Range   WBC 14.3 (*) 4.0 - 10.5 K/uL   RBC 4.24  3.87 - 5.11 MIL/uL   Hemoglobin 11.2 (*) 12.0 - 15.0 g/dL   HCT 16.1 (*) 09.6 - 04.5 %   MCV 80.2  78.0 - 100.0 fL   MCH 26.4  26.0 - 34.0 pg   MCHC 32.9  30.0 - 36.0 g/dL   RDW 40.9  81.1 - 91.4 %   Platelets 286  150 - 400 K/uL   Neutrophils Relative 85 (*) 43 - 77 %   Neutro Abs 12.1 (*) 1.7 - 7.7 K/uL   Lymphocytes Relative 9 (*) 12 - 46 %   Lymphs Abs 1.2  0.7 - 4.0 K/uL   Monocytes Relative 6  3 - 12 %   Monocytes Absolute 0.9  0.1 - 1.0 K/uL   Eosinophils Relative 0  0 - 5 %   Eosinophils Absolute 0.0  0.0 - 0.7 K/uL   Basophils Relative 0  0 - 1 %   Basophils Absolute 0.0  0.0 - 0.1 K/uL  COMPREHENSIVE METABOLIC PANEL     Status: Abnormal   Collection Time    01/04/13  3:24 AM      Result Value Range   Sodium 139  135 - 145 mEq/L   Potassium 3.6  3.5 - 5.1 mEq/L   Chloride 103  96 - 112 mEq/L   CO2 26  19 - 32 mEq/L   Glucose, Bld 249 (*) 70 - 99 mg/dL   BUN 11  6 - 23 mg/dL   Creatinine, Ser 7.82  0.50 - 1.10 mg/dL   Calcium 9.3  8.4 - 95.6 mg/dL   Total Protein 7.7  6.0 - 8.3 g/dL   Albumin 3.6  3.5 - 5.2 g/dL   AST 17  0 - 37 U/L   ALT 15  0 - 35 U/L   Alkaline Phosphatase 126 (*) 39 - 117 U/L   Total Bilirubin 0.6  0.3 - 1.2 mg/dL   GFR calc non Af Amer >90  >90 mL/min   GFR calc Af Amer >90  >90 mL/min   Comment:  The eGFR has been calculated     using the CKD EPI equation.     This calculation has not been     validated in all clinical     situations.     eGFR's persistently     <90 mL/min signify     possible Chronic Kidney Disease.  POCT I-STAT TROPONIN I     Status: None   Collection Time    01/04/13  3:44 AM      Result Value Range   Troponin i, poc 0.00  0.00 - 0.08 ng/mL   Comment 3            Comment: Due to the  release kinetics of cTnI,     a negative result within the first hours     of the onset of symptoms does not rule out     myocardial infarction with certainty.     If myocardial infarction is still suspected,     repeat the test at appropriate intervals.   Ct Abdomen Pelvis Wo Contrast  01/04/2013  *RADIOLOGY REPORT*  Clinical Data: Left lower quadrant pain and left flank pain. Nausea and vomiting.History of contrast reaction.  No IV contrast material given.  CT ABDOMEN AND PELVIS WITHOUT CONTRAST  Technique:  Multidetector CT imaging of the abdomen and pelvis was performed following the standard protocol without intravenous contrast.  Comparison: 07/04/2004  Findings: The lung bases are clear.  There is a 6 mm stone in the proximal left ureter at the ureteropelvic junction.  There is proximal pyelocaliectasis and pararenal stranding consistent with moderate obstruction.  Multiple stones are demonstrated in the lower pole calyces on the left without additional obstruction.  The distal ureter is decompressed. No renal or ureteral stones demonstrated on the right.  No bladder stone or bladder wall thickening.  The unenhanced appearance of the liver, spleen, gallbladder, pancreas, adrenal glands, abdominal aorta, inferior vena cava, and retroperitoneal lymph nodes is unremarkable.  The stomach and small bowel are mostly decompressed.  Stool filled colon without abnormal distension.  Mildly prominent visceral adipose tissues.  No free air or free fluid in the abdomen.  Scarring in the anterior abdominal wall with interval repair of previously demonstrated abdominal wall hernia.  Pelvis:  The uterus appears to be surgically absent.  No abnormal adnexal masses.  The appendix is not identified.  No free or loculated pelvic fluid collections.  No significant pelvic lymphadenopathy.  Mild degenerative changes in the lumbar spine.  IMPRESSION: 6 mm stone in the left ureteropelvic junction with moderate proximal  obstruction.  Multiple additional stones in the lower pole calix on the left.   Original Report Authenticated By: Burman Nieves, M.D.     Review of Systems - Negative except depression, abdominal pain, flank discomfort, nausea  Blood pressure 152/89, pulse 70, temperature 98.1 F (36.7 C), temperature source Oral, resp. rate 22, height 5\' 3"  (1.6 m), weight 142.203 kg (313 lb 8 oz), SpO2 95.00%. General appearance: alert, cooperative and no distress morbidly obese Resp: Normal effort Cardio: regular rate and rhythm GI: soft, non-tender; bowel sounds normal; no masses,  no organomegalyl Extremities: extremities normal, atraumatic, no cyanosis or edema Skin: Skin color, texture, turgor normal. No rashes or lesions Neurologic: Grossly normal  Assessment/Plan 6 mm proximal left ureteral calculus. The patient also has a number of significant medical core morbidities. The stone has less than 50% chance of spontaneous passage. Her pain was not well-controlled in the emergency room. At this point my  recommendation is retrograde pyelography with placement of a double-J stent. We will then plan on definitive management at a later date most likely with ESWL. We may be able to push the stone back up into the lower pole calyx where we can treat all of the stones. The patient does have a number of medical core morbidities. She does use a CPAP machine and we will need to get family to bring that a.m. He does appear that she will need to at least be kept overnight given her medical issues and consideration for discharge in the morning if things go well.  Hermila Millis S 01/04/2013, 9:35 AM

## 2013-01-04 NOTE — Progress Notes (Signed)
Utilization review completed.  

## 2013-01-05 ENCOUNTER — Observation Stay (HOSPITAL_COMMUNITY): Payer: Medicare Other

## 2013-01-05 ENCOUNTER — Encounter (HOSPITAL_COMMUNITY): Payer: Self-pay | Admitting: Urology

## 2013-01-05 DIAGNOSIS — R110 Nausea: Secondary | ICD-10-CM | POA: Diagnosis not present

## 2013-01-05 DIAGNOSIS — R1012 Left upper quadrant pain: Secondary | ICD-10-CM | POA: Diagnosis not present

## 2013-01-05 DIAGNOSIS — N201 Calculus of ureter: Secondary | ICD-10-CM | POA: Diagnosis not present

## 2013-01-05 DIAGNOSIS — N200 Calculus of kidney: Secondary | ICD-10-CM | POA: Diagnosis not present

## 2013-01-05 MED ORDER — HYDROCODONE-ACETAMINOPHEN 5-325 MG PO TABS: 1.0000 | ORAL_TABLET | Freq: Four times a day (QID) | ORAL | Status: DC | PRN

## 2013-01-05 NOTE — Discharge Summary (Signed)
Physician Discharge Summary  Patient ID: Whitney [name] MRN: [id number] DOB/AGE: 11/10/1955 57 y.o.  Admit date: 01/04/2013 Discharge date: 01/05/2013  Admission Diagnoses:  Discharge Diagnoses:  Active Problems:   * No active hospital problems. *   Discharged Condition: good  Hospital Course:  [name] presented to [facility] emergency room on 01/04/2013 with left-sided abdominal and flank pain. She was diagnosed with a 6 mm proximal left ureteral stone as well as multiple stones in the lower pole of her left kidney. The patient's pain could not be well controlled. We requested that she be transferred to [facility] long. We then took her to surgery and performed a retrograde pyelogram and placement of a left double-J stent. There was no evidence of active infection or urosepsis. The patient's pain resolved and she feels much better this morning. She's having no difficulty or problems. She was kept overnight to do multiple medical problems including morbid obesity, sleep apnea and diabetes mellitus. Abdominal x-ray after surgery demonstrated good position of the stent. She will need definitive stone management at a later date.  Consults: None  Significant Diagnostic Studies: Multiple imaging studies including CT scan and abdominal x-rays  Treatments: surgery: Cystoscopy, retrograde pyelogram and left double-J stent placement  Discharge Exam: Blood pressure 154/77, pulse 69, temperature 98.6 F (37 C), temperature source Oral, resp. rate 16, height 5\' 3"  (1.6 m), weight 142.203 kg (313 lb 8 oz), SpO2 100.00%. General appearance: alert and cooperative Abdomen: Soft nontender no CVA tenderness Respiratory: Normal effort Extremities: No change  Disposition: 03-Skilled Nursing Facility     Medication List    STOP taking these medications       aspirin EC 81 MG tablet      TAKE these medications       acetaminophen 500 MG tablet  Commonly known as:  TYLENOL  Take 500 mg by mouth  every 6 (six) hours as needed. For pain     bimatoprost 0.03 % ophthalmic solution  Commonly known as:  LUMIGAN  Place 1 drop into both eyes at bedtime.     furosemide 40 MG tablet  Commonly known as:  LASIX  Take 1 tablet (40 mg total) by mouth daily.     glipiZIDE 5 MG tablet  Commonly known as:  GLUCOTROL  Take 5 mg by mouth daily.     HYDROcodone-acetaminophen 5-325 MG per tablet  Commonly known as:  NORCO/VICODIN  Take 1-2 tablets by mouth every 6 (six) hours as needed for pain.     lisinopril 20 MG tablet  Commonly known as:  PRINIVIL,ZESTRIL  Take 20 mg by mouth daily.     metoprolol succinate 100 MG 24 hr tablet  Commonly known as:  TOPROL-XL  Take 100 mg by mouth daily. Take with or immediately following a meal.     metoprolol succinate 100 MG 24 hr tablet  Commonly known as:  TOPROL-XL  Take 1 tablet (100 mg total) by mouth daily. Take with or immediately following a meal.     oxybutynin 5 MG 24 hr tablet  Commonly known as:  DITROPAN-XL  Take 5 mg by mouth daily.     pantoprazole 40 MG tablet  Commonly known as:  PROTONIX  Take 1 tablet (40 mg total) by mouth daily at 12 noon.     pravastatin 40 MG tablet  Commonly known as:  PRAVACHOL  Take 40 mg by mouth daily.     traMADol 50 MG tablet  Commonly known as:  Janean Sark  Take 50 mg by mouth every 6 (six) hours as needed. For pain           Follow-up Information   Please follow up. (Dr. Ellin Goodie office will call you)       Signed: Madasyn Heath S 01/05/2013, 8:22 AM

## 2013-01-06 DIAGNOSIS — M25569 Pain in unspecified knee: Secondary | ICD-10-CM | POA: Diagnosis not present

## 2013-01-06 DIAGNOSIS — M1710 Unilateral primary osteoarthritis, unspecified knee: Secondary | ICD-10-CM | POA: Diagnosis not present

## 2013-01-06 DIAGNOSIS — M25869 Other specified joint disorders, unspecified knee: Secondary | ICD-10-CM | POA: Diagnosis not present

## 2013-01-06 DIAGNOSIS — E669 Obesity, unspecified: Secondary | ICD-10-CM | POA: Diagnosis not present

## 2013-01-06 DIAGNOSIS — M179 Osteoarthritis of knee, unspecified: Secondary | ICD-10-CM | POA: Insufficient documentation

## 2013-01-06 DIAGNOSIS — M170 Bilateral primary osteoarthritis of knee: Secondary | ICD-10-CM | POA: Insufficient documentation

## 2013-01-06 DIAGNOSIS — M8569 Other cyst of bone, multiple sites: Secondary | ICD-10-CM | POA: Diagnosis not present

## 2013-01-09 ENCOUNTER — Other Ambulatory Visit: Payer: Self-pay | Admitting: Urology

## 2013-01-12 ENCOUNTER — Encounter (HOSPITAL_COMMUNITY): Payer: Self-pay | Admitting: *Deleted

## 2013-01-12 NOTE — Pre-Procedure Instructions (Addendum)
Asked to bring blue folder the day of the procedure,insurance card,I.D. driver's license,wear comfortable clothing and have a driver for the day. Asked not to take Advil,Motrin,Ibuprofen,Aleve or any NSAIDS, Aspirin, Aspirin products or Toradol for 72 hours prior to procedure,  No vitamins or herbal medications 7 days prior to procedure. To stop Aspirin 72 hours before procedure and not take her diabetic medication the day of the procedure, told that we would check her blood sugar that morning. Instructed to take laxative per doctor's office instructions and eat a light dinner the evening before procedure.   To arrive at  [facility]  for lithotripsy procedure.  Ambulates with a walker or cane plans to bring her cane, stated her left knee needed to replaced and right hip was bad too.  She stated " I can walk a short distance without being in pain ".

## 2013-01-19 ENCOUNTER — Encounter (HOSPITAL_COMMUNITY): Payer: Self-pay | Admitting: Pharmacy Technician

## 2013-01-19 DIAGNOSIS — N201 Calculus of ureter: Secondary | ICD-10-CM | POA: Diagnosis not present

## 2013-01-19 DIAGNOSIS — R82998 Other abnormal findings in urine: Secondary | ICD-10-CM | POA: Diagnosis not present

## 2013-01-20 ENCOUNTER — Other Ambulatory Visit: Payer: Self-pay | Admitting: Urology

## 2013-01-22 ENCOUNTER — Encounter (HOSPITAL_COMMUNITY)
Admission: RE | Admit: 2013-01-22 | Discharge: 2013-01-22 | Disposition: A | Discharge status: Home / Self Care | Payer: Medicare Other | Source: Ambulatory Visit | Attending: Urology | Admitting: Urology

## 2013-01-22 ENCOUNTER — Encounter (HOSPITAL_COMMUNITY): Payer: Self-pay

## 2013-01-22 DIAGNOSIS — N200 Calculus of kidney: Secondary | ICD-10-CM | POA: Diagnosis not present

## 2013-01-22 DIAGNOSIS — Z7982 Long term (current) use of aspirin: Secondary | ICD-10-CM | POA: Diagnosis not present

## 2013-01-22 DIAGNOSIS — G4730 Sleep apnea, unspecified: Secondary | ICD-10-CM | POA: Diagnosis not present

## 2013-01-22 DIAGNOSIS — I10 Essential (primary) hypertension: Secondary | ICD-10-CM | POA: Diagnosis not present

## 2013-01-22 DIAGNOSIS — E119 Type 2 diabetes mellitus without complications: Secondary | ICD-10-CM | POA: Diagnosis not present

## 2013-01-22 DIAGNOSIS — R011 Cardiac murmur, unspecified: Secondary | ICD-10-CM | POA: Diagnosis not present

## 2013-01-22 DIAGNOSIS — I509 Heart failure, unspecified: Secondary | ICD-10-CM | POA: Diagnosis not present

## 2013-01-22 DIAGNOSIS — K219 Gastro-esophageal reflux disease without esophagitis: Secondary | ICD-10-CM | POA: Diagnosis not present

## 2013-01-22 DIAGNOSIS — I252 Old myocardial infarction: Secondary | ICD-10-CM | POA: Diagnosis not present

## 2013-01-22 DIAGNOSIS — N201 Calculus of ureter: Secondary | ICD-10-CM | POA: Diagnosis not present

## 2013-01-22 DIAGNOSIS — Z79899 Other long term (current) drug therapy: Secondary | ICD-10-CM | POA: Diagnosis not present

## 2013-01-22 DIAGNOSIS — Z01812 Encounter for preprocedural laboratory examination: Secondary | ICD-10-CM | POA: Diagnosis not present

## 2013-01-22 HISTORY — DX: Depression, unspecified: F32.A

## 2013-01-22 HISTORY — DX: Major depressive disorder, single episode, unspecified: F32.9

## 2013-01-22 HISTORY — DX: Unspecified glaucoma: H40.9

## 2013-01-22 HISTORY — DX: Hyperlipidemia, unspecified: E78.5

## 2013-01-22 HISTORY — DX: Personal history of sudden cardiac arrest: Z86.74

## 2013-01-22 LAB — CBC
HCT: 36.5 % (ref 36.0–46.0)
Hemoglobin: 11.5 g/dL — ABNORMAL LOW (ref 12.0–15.0)
MCV: 82.6 fL (ref 78.0–100.0)
RDW: 14.1 % (ref 11.5–15.5)
WBC: 10.4 10*3/uL (ref 4.0–10.5)

## 2013-01-22 NOTE — Progress Notes (Signed)
Chest x-ray 1 view 01/04/13 on EPIC, EKG 01/04/13 on EPIC, CMET 01/04/13 on EPIC, CBC with diff and MRSA 01/04/13 on EPIC are abnormal: will repeat.

## 2013-01-22 NOTE — Patient Instructions (Addendum)
20 Whitney [name]  01/22/2013   Your procedure is scheduled on: 01/26/13  Report to [facility] at 0630 AM.  Call this number if you have problems the morning of surgery [phone]: [phone]   Remember: please bring CPAP mask and tubing   Do not eat food or drink liquids After Midnight.     Take these medicines the morning of surgery with A SIP OF WATER: metoprolol succinate, protonix, vicodin if needed   Do not wear jewelry, make-up or nail polish.  Do not wear lotions, powders, or perfumes. You may wear deodorant.  Do not shave 48 hours prior to surgery. Men may shave face and neck.  Do not bring valuables to the hospital.  Contacts, dentures or bridgework may not be worn into surgery.     Patients discharged the day of surgery will not be allowed to drive home.  Name and phone number of your driver: Tomasa Rand [phone]    Please read over the following fact sheets that you were given: MRSA Information.  Birdie Sons, RN  pre op nurse call if needed [phone]    FAILURE TO FOLLOW THESE INSTRUCTIONS MAY RESULT IN CANCELLATION OF YOUR SURGERY   Patient Signature: ___________________________________________

## 2013-01-25 NOTE — H&P (View-Only) (Signed)
Whitney [name] is an 56 y.o. female.   Chief Complaint: Left flank pain with newly diagnosed 6 mm left proximal ureteral stone. HPI: Whitney [name] denies prior urologic history. She does have a number of significant medical core morbidities including morbid obesity, type 2 diabetes mellitus, history of cardiac arrest without apparent coronary vascular disease, severe sleep apnea who presented with left-sided abdominal and flank discomfort with associated nausea. She was in the ER for hours attempting to control her pain. We were contacted by the ER physicians at [facility] and told that this patient was not been available to go home for pain management. He requested that she be transferred to [facility] for consideration of surgical intervention. The patient denies prior episodes of nephrolithiasis. CT shows a 6 mm stone in the proximal left ureter. There is dilation was some perirenal stranding consistent with moderate obstruction and additional stones noted in the lower pole. Her pain at this time is a 7/10. Her white blood cell count is elevated slightly although her urine does not show any evidence of obvious cystitis and she certainly does not have clinical evidence of urosepsis at this point.  Past Medical History  Diagnosis Date  . Diabetes mellitus   . Hypertension   . Cardiac arrest   . Myocardial infarction   . Sleep apnea   . Heart murmur   . GERD (gastroesophageal reflux disease)   . Arthritis     Past Surgical History  Procedure Laterality Date  . Back surgery    . Partial hysterectomy    . Knee surgery      left  . Hernia repair    . Foot surgery      Family History  Problem Relation Age of Onset  . Asthma Mother    Social History:  reports that she has never smoked. She has never used smokeless tobacco. She reports that she does not drink alcohol or use illicit drugs.  Allergies:  Allergies  Allergen Reactions  . Contrast Media (Iodinated Diagnostic Agents)  Anaphylaxis    Medications Prior to Admission  Medication Sig Dispense Refill  . acetaminophen (TYLENOL) 500 MG tablet Take 500 mg by mouth every 6 (six) hours as needed. For pain      . aspirin EC 81 MG tablet Take 81 mg by mouth daily.      . bimatoprost (LUMIGAN) 0.03 % ophthalmic solution Place 1 drop into both eyes at bedtime.      . glipiZIDE (GLUCOTROL) 5 MG tablet Take 5 mg by mouth daily.       . lisinopril (PRINIVIL,ZESTRIL) 20 MG tablet Take 20 mg by mouth daily.      . metoprolol succinate (TOPROL-XL) 100 MG 24 hr tablet Take 100 mg by mouth daily. Take with or immediately following a meal.      . oxybutynin (DITROPAN-XL) 5 MG 24 hr tablet Take 5 mg by mouth daily.      . pravastatin (PRAVACHOL) 40 MG tablet Take 40 mg by mouth daily.      . traMADol (ULTRAM) 50 MG tablet Take 50 mg by mouth every 6 (six) hours as needed. For pain      . furosemide (LASIX) 40 MG tablet Take 1 tablet (40 mg total) by mouth daily.  30 tablet  0  . metoprolol succinate (TOPROL-XL) 100 MG 24 hr tablet Take 1 tablet (100 mg total) by mouth daily. Take with or immediately following a meal.  30 tablet  0  .   pantoprazole (PROTONIX) 40 MG tablet Take 1 tablet (40 mg total) by mouth daily at 12 noon.  30 tablet  0    Results for orders placed during the hospital encounter of 01/04/13 (from the past 48 hour(s))  URINALYSIS, MICROSCOPIC ONLY     Status: Abnormal   Collection Time    01/04/13  3:18 AM      Result Value Range   Color, Urine YELLOW  YELLOW   APPearance CLEAR  CLEAR   Specific Gravity, Urine 1.019  1.005 - 1.030   pH 6.5  5.0 - 8.0   Glucose, UA 250 (*) NEGATIVE mg/dL   Hgb urine dipstick SMALL (*) NEGATIVE   Bilirubin Urine NEGATIVE  NEGATIVE   Ketones, ur NEGATIVE  NEGATIVE mg/dL   Protein, ur NEGATIVE  NEGATIVE mg/dL   Urobilinogen, UA 0.2  0.0 - 1.0 mg/dL   Nitrite NEGATIVE  NEGATIVE   Leukocytes, UA NEGATIVE  NEGATIVE   WBC, UA 0-2  <3 WBC/hpf   RBC / HPF 3-6  <3 RBC/hpf    Bacteria, UA FEW (*) RARE   Squamous Epithelial / LPF FEW (*) RARE  CBC WITH DIFFERENTIAL     Status: Abnormal   Collection Time    01/04/13  3:24 AM      Result Value Range   WBC 14.3 (*) 4.0 - 10.5 K/uL   RBC 4.24  3.87 - 5.11 MIL/uL   Hemoglobin 11.2 (*) 12.0 - 15.0 g/dL   HCT 34.0 (*) 36.0 - 46.0 %   MCV 80.2  78.0 - 100.0 fL   MCH 26.4  26.0 - 34.0 pg   MCHC 32.9  30.0 - 36.0 g/dL   RDW 14.1  11.5 - 15.5 %   Platelets 286  150 - 400 K/uL   Neutrophils Relative 85 (*) 43 - 77 %   Neutro Abs 12.1 (*) 1.7 - 7.7 K/uL   Lymphocytes Relative 9 (*) 12 - 46 %   Lymphs Abs 1.2  0.7 - 4.0 K/uL   Monocytes Relative 6  3 - 12 %   Monocytes Absolute 0.9  0.1 - 1.0 K/uL   Eosinophils Relative 0  0 - 5 %   Eosinophils Absolute 0.0  0.0 - 0.7 K/uL   Basophils Relative 0  0 - 1 %   Basophils Absolute 0.0  0.0 - 0.1 K/uL  COMPREHENSIVE METABOLIC PANEL     Status: Abnormal   Collection Time    01/04/13  3:24 AM      Result Value Range   Sodium 139  135 - 145 mEq/L   Potassium 3.6  3.5 - 5.1 mEq/L   Chloride 103  96 - 112 mEq/L   CO2 26  19 - 32 mEq/L   Glucose, Bld 249 (*) 70 - 99 mg/dL   BUN 11  6 - 23 mg/dL   Creatinine, Ser 0.72  0.50 - 1.10 mg/dL   Calcium 9.3  8.4 - 10.5 mg/dL   Total Protein 7.7  6.0 - 8.3 g/dL   Albumin 3.6  3.5 - 5.2 g/dL   AST 17  0 - 37 U/L   ALT 15  0 - 35 U/L   Alkaline Phosphatase 126 (*) 39 - 117 U/L   Total Bilirubin 0.6  0.3 - 1.2 mg/dL   GFR calc non Af Amer >90  >90 mL/min   GFR calc Af Amer >90  >90 mL/min   Comment:              The eGFR has been calculated     using the CKD EPI equation.     This calculation has not been     validated in all clinical     situations.     eGFR's persistently     <90 mL/min signify     possible Chronic Kidney Disease.  POCT I-STAT TROPONIN I     Status: None   Collection Time    01/04/13  3:44 AM      Result Value Range   Troponin i, poc 0.00  0.00 - 0.08 ng/mL   Comment 3            Comment: Due to the  release kinetics of cTnI,     a negative result within the first hours     of the onset of symptoms does not rule out     myocardial infarction with certainty.     If myocardial infarction is still suspected,     repeat the test at appropriate intervals.   Ct Abdomen Pelvis Wo Contrast  01/04/2013  *RADIOLOGY REPORT*  Clinical Data: Left lower quadrant pain and left flank pain. Nausea and vomiting.History of contrast reaction.  No IV contrast material given.  CT ABDOMEN AND PELVIS WITHOUT CONTRAST  Technique:  Multidetector CT imaging of the abdomen and pelvis was performed following the standard protocol without intravenous contrast.  Comparison: 07/04/2004  Findings: The lung bases are clear.  There is a 6 mm stone in the proximal left ureter at the ureteropelvic junction.  There is proximal pyelocaliectasis and pararenal stranding consistent with moderate obstruction.  Multiple stones are demonstrated in the lower pole calyces on the left without additional obstruction.  The distal ureter is decompressed. No renal or ureteral stones demonstrated on the right.  No bladder stone or bladder wall thickening.  The unenhanced appearance of the liver, spleen, gallbladder, pancreas, adrenal glands, abdominal aorta, inferior vena cava, and retroperitoneal lymph nodes is unremarkable.  The stomach and small bowel are mostly decompressed.  Stool filled colon without abnormal distension.  Mildly prominent visceral adipose tissues.  No free air or free fluid in the abdomen.  Scarring in the anterior abdominal wall with interval repair of previously demonstrated abdominal wall hernia.  Pelvis:  The uterus appears to be surgically absent.  No abnormal adnexal masses.  The appendix is not identified.  No free or loculated pelvic fluid collections.  No significant pelvic lymphadenopathy.  Mild degenerative changes in the lumbar spine.  IMPRESSION: 6 mm stone in the left ureteropelvic junction with moderate proximal  obstruction.  Multiple additional stones in the lower pole calix on the left.   Original Report Authenticated By: William Stevens, M.D.     Review of Systems - Negative except depression, abdominal pain, flank discomfort, nausea  Blood pressure 152/89, pulse 70, temperature 98.1 F (36.7 C), temperature source Oral, resp. rate 22, height 5' 3" (1.6 m), weight 142.203 kg (313 lb 8 oz), SpO2 95.00%. General appearance: alert, cooperative and no distress morbidly obese Resp: Normal effort Cardio: regular rate and rhythm GI: soft, non-tender; bowel sounds normal; no masses,  no organomegalyl Extremities: extremities normal, atraumatic, no cyanosis or edema Skin: Skin color, texture, turgor normal. No rashes or lesions Neurologic: Grossly normal  Assessment/Plan 6 mm proximal left ureteral calculus. The patient also has a number of significant medical core morbidities. The stone has less than 50% chance of spontaneous passage. Her pain was not well-controlled in the emergency room. At this point my   recommendation is retrograde pyelography with placement of a double-J stent. We will then plan on definitive management at a later date most likely with ESWL. We may be able to push the stone back up into the lower pole calyx where we can treat all of the stones. The patient does have a number of medical core morbidities. She does use a CPAP machine and we will need to get family to bring that a.m. He does appear that she will need to at least be kept overnight given her medical issues and consideration for discharge in the morning if things go well.  Ruba Outen S 01/04/2013, 9:35 AM  

## 2013-01-25 NOTE — Interval H&P Note (Signed)
History and Physical Interval Note: Patient is status post urgent stent placement for severe pain secondary to your ureteral calculus. Our initial plan was to follow that with ESWL but secondary to the patient'[name] body habitus and inability to clearly visualize the stone we are now going ahead with definitive ureteroscopy in an attempt to definitively manage this stone.  01/25/2013 10:22 PM  Whitney [name]  has presented today for surgery, with the diagnosis of LEFT URETERAL CALCULUS  The various methods of treatment have been discussed with the patient and family. After consideration of risks, benefits and other options for treatment, the patient has consented to  Procedure([name]) with comments: CYSTOSCOPY, JJ STENT REMOVAL, LEFT URETEROSCOPY,  (Left) - CYSTO, JJ STENT REMOVAL, LEFT URETEROSCOPY, POSSIBLE HOLMIUM LASER LITHOTRIPSY  POSSIBLE HOLMIUM LASER LITHOTRIPSY  (Left) as a surgical intervention .  The patient'[name] history has been reviewed, patient examined, no change in status, stable for surgery.  I have reviewed the patient'[name] chart and labs.  Questions were answered to the patient'[name] satisfaction.     Whitney [name]

## 2013-01-26 ENCOUNTER — Encounter (HOSPITAL_COMMUNITY)
Admission: RE | Disposition: A | Discharge status: Home / Self Care | Payer: Self-pay | Source: Ambulatory Visit | Attending: Urology

## 2013-01-26 ENCOUNTER — Ambulatory Visit (HOSPITAL_COMMUNITY)
Admission: RE | Admit: 2013-01-26 | Discharge: 2013-01-26 | Disposition: A | Discharge status: Home / Self Care | Payer: Medicare Other | Source: Ambulatory Visit | Attending: Urology | Admitting: Urology

## 2013-01-26 ENCOUNTER — Ambulatory Visit (HOSPITAL_COMMUNITY): Payer: Medicare Other | Admitting: Anesthesiology

## 2013-01-26 ENCOUNTER — Encounter (HOSPITAL_COMMUNITY): Payer: Self-pay | Admitting: *Deleted

## 2013-01-26 ENCOUNTER — Encounter (HOSPITAL_COMMUNITY): Payer: Self-pay | Admitting: Anesthesiology

## 2013-01-26 DIAGNOSIS — N200 Calculus of kidney: Secondary | ICD-10-CM | POA: Insufficient documentation

## 2013-01-26 DIAGNOSIS — I10 Essential (primary) hypertension: Secondary | ICD-10-CM | POA: Insufficient documentation

## 2013-01-26 DIAGNOSIS — R011 Cardiac murmur, unspecified: Secondary | ICD-10-CM | POA: Insufficient documentation

## 2013-01-26 DIAGNOSIS — Z01812 Encounter for preprocedural laboratory examination: Secondary | ICD-10-CM | POA: Insufficient documentation

## 2013-01-26 DIAGNOSIS — I252 Old myocardial infarction: Secondary | ICD-10-CM | POA: Insufficient documentation

## 2013-01-26 DIAGNOSIS — E119 Type 2 diabetes mellitus without complications: Secondary | ICD-10-CM | POA: Diagnosis not present

## 2013-01-26 DIAGNOSIS — Z79899 Other long term (current) drug therapy: Secondary | ICD-10-CM | POA: Insufficient documentation

## 2013-01-26 DIAGNOSIS — I509 Heart failure, unspecified: Secondary | ICD-10-CM | POA: Insufficient documentation

## 2013-01-26 DIAGNOSIS — N201 Calculus of ureter: Secondary | ICD-10-CM | POA: Diagnosis not present

## 2013-01-26 DIAGNOSIS — G4730 Sleep apnea, unspecified: Secondary | ICD-10-CM | POA: Insufficient documentation

## 2013-01-26 DIAGNOSIS — Z7982 Long term (current) use of aspirin: Secondary | ICD-10-CM | POA: Insufficient documentation

## 2013-01-26 DIAGNOSIS — K219 Gastro-esophageal reflux disease without esophagitis: Secondary | ICD-10-CM | POA: Diagnosis not present

## 2013-01-26 HISTORY — PX: HOLMIUM LASER APPLICATION: SHX5852

## 2013-01-26 HISTORY — PX: CYSTOSCOPY WITH RETROGRADE PYELOGRAM, URETEROSCOPY AND STENT PLACEMENT: SHX5789

## 2013-01-26 SURGERY — LITHOTRIPSY, ESWL
Anesthesia: Monitor Anesthesia Care | Laterality: Left

## 2013-01-26 SURGERY — CYSTOURETEROSCOPY, WITH RETROGRADE PYELOGRAM AND STENT INSERTION
Anesthesia: General | Laterality: Left | Wound class: Clean Contaminated

## 2013-01-26 MED ORDER — GLYCOPYRROLATE 0.2 MG/ML IJ SOLN
INTRAMUSCULAR | Status: DC | PRN
  Administered 2013-01-26: .6 mg via INTRAVENOUS

## 2013-01-26 MED ORDER — LACTATED RINGERS IV SOLN
INTRAVENOUS | Status: DC | PRN
  Administered 2013-01-26: 08:00:00 via INTRAVENOUS

## 2013-01-26 MED ORDER — PROPOFOL 10 MG/ML IV BOLUS
INTRAVENOUS | Status: DC | PRN
  Administered 2013-01-26: 250 mg via INTRAVENOUS

## 2013-01-26 MED ORDER — MORPHINE SULFATE 10 MG/ML IJ SOLN
INTRAMUSCULAR | Status: AC
  Filled 2013-01-26: qty 1

## 2013-01-26 MED ORDER — ROCURONIUM BROMIDE 100 MG/10ML IV SOLN
INTRAVENOUS | Status: DC | PRN
  Administered 2013-01-26: 20 mg via INTRAVENOUS

## 2013-01-26 MED ORDER — LIDOCAINE HCL 2 % EX GEL
CUTANEOUS | Status: AC
  Filled 2013-01-26: qty 10

## 2013-01-26 MED ORDER — BELLADONNA ALKALOIDS-OPIUM 16.2-60 MG RE SUPP
RECTAL | Status: AC
  Filled 2013-01-26: qty 1

## 2013-01-26 MED ORDER — LIDOCAINE HCL (CARDIAC) 20 MG/ML IV SOLN
INTRAVENOUS | Status: DC | PRN
  Administered 2013-01-26: 100 mg via INTRAVENOUS

## 2013-01-26 MED ORDER — PROMETHAZINE HCL 25 MG/ML IJ SOLN
6.2500 mg | INTRAMUSCULAR | Status: DC | PRN
  Administered 2013-01-26: 6.25 mg via INTRAVENOUS

## 2013-01-26 MED ORDER — FENTANYL CITRATE 0.05 MG/ML IJ SOLN
INTRAMUSCULAR | Status: DC | PRN
  Administered 2013-01-26 (×2): 50 ug via INTRAVENOUS

## 2013-01-26 MED ORDER — LACTATED RINGERS IV SOLN: INTRAVENOUS | Status: DC

## 2013-01-26 MED ORDER — MIDAZOLAM HCL 5 MG/5ML IJ SOLN
INTRAMUSCULAR | Status: DC | PRN
  Administered 2013-01-26: 2 mg via INTRAVENOUS

## 2013-01-26 MED ORDER — SUCCINYLCHOLINE CHLORIDE 20 MG/ML IJ SOLN
INTRAMUSCULAR | Status: DC | PRN
Start: 2013-01-26 — End: 2013-01-26
  Administered 2013-01-26: 100 mg via INTRAVENOUS

## 2013-01-26 MED ORDER — IOHEXOL 300 MG/ML  SOLN
INTRAMUSCULAR | Status: AC
  Filled 2013-01-26: qty 1

## 2013-01-26 MED ORDER — KETOROLAC TROMETHAMINE 30 MG/ML IJ SOLN
INTRAMUSCULAR | Status: DC | PRN
  Administered 2013-01-26: 30 mg via INTRAVENOUS

## 2013-01-26 MED ORDER — NEOSTIGMINE METHYLSULFATE 1 MG/ML IJ SOLN
INTRAMUSCULAR | Status: DC | PRN
  Administered 2013-01-26: 4 mg via INTRAVENOUS

## 2013-01-26 MED ORDER — MORPHINE SULFATE 10 MG/ML IJ SOLN
1.0000 mg | INTRAMUSCULAR | Status: DC | PRN
  Administered 2013-01-26: 1 mg via INTRAVENOUS
  Administered 2013-01-26 (×2): 2 mg via INTRAVENOUS

## 2013-01-26 MED ORDER — DEXTROSE 5 % IV SOLN
2.0000 g | Freq: Once | INTRAVENOUS | Status: AC
  Administered 2013-01-26: 2 g via INTRAVENOUS
  Filled 2013-01-26: qty 2

## 2013-01-26 MED ORDER — HYDROMORPHONE HCL PF 1 MG/ML IJ SOLN
0.2500 mg | INTRAMUSCULAR | Status: DC | PRN
  Administered 2013-01-26: 0.5 mg via INTRAVENOUS
  Administered 2013-01-26 (×2): 0.25 mg via INTRAVENOUS

## 2013-01-26 MED ORDER — PROMETHAZINE HCL 25 MG/ML IJ SOLN
INTRAMUSCULAR | Status: AC
  Filled 2013-01-26: qty 1

## 2013-01-26 MED ORDER — SODIUM CHLORIDE 0.9 % IR SOLN
Status: DC | PRN
  Administered 2013-01-26: 3000 mL via INTRAVESICAL

## 2013-01-26 MED ORDER — HYDROMORPHONE HCL PF 1 MG/ML IJ SOLN
INTRAMUSCULAR | Status: AC
  Filled 2013-01-26: qty 1

## 2013-01-26 MED ORDER — ONDANSETRON HCL 4 MG/2ML IJ SOLN
INTRAMUSCULAR | Status: DC | PRN
  Administered 2013-01-26: 4 mg via INTRAVENOUS

## 2013-01-26 MED ORDER — HYDROCODONE-ACETAMINOPHEN 5-325 MG PO TABS
1.0000 | ORAL_TABLET | Freq: Once | ORAL | Status: AC
  Administered 2013-01-26: 1 via ORAL
  Filled 2013-01-26: qty 1

## 2013-01-26 SURGICAL SUPPLY — 23 items
BAG URINE DRAINAGE (UROLOGICAL SUPPLIES) ×2 IMPLANT
BAG URO CATCHER STRL LF (DRAPE) ×2 IMPLANT
BASKET ZERO TIP NITINOL 2.4FR (BASKET) ×1 IMPLANT
BSKT STON RTRVL ZERO TP 2.4FR (BASKET) ×1
CATH INTERMIT  6FR 70CM (CATHETERS) IMPLANT
CATH URET 5FR 28IN OPEN ENDED (CATHETERS) ×2 IMPLANT
CLOTH BEACON ORANGE TIMEOUT ST (SAFETY) ×2 IMPLANT
DRAPE CAMERA CLOSED 9X96 (DRAPES) ×2 IMPLANT
FIBER LASER TRAC TIP (UROLOGICAL SUPPLIES) ×1 IMPLANT
GLOVE BIOGEL M STRL SZ7.5 (GLOVE) ×2 IMPLANT
GLOVE SURG SS PI 8.0 STRL IVOR (GLOVE) ×1 IMPLANT
GOWN PREVENTION PLUS XLARGE (GOWN DISPOSABLE) ×2 IMPLANT
GOWN STRL REIN XL XLG (GOWN DISPOSABLE) ×2 IMPLANT
GUIDEWIRE .038 (WIRE) IMPLANT
GUIDEWIRE ANG ZIPWIRE 038X150 (WIRE) IMPLANT
GUIDEWIRE STR DUAL SENSOR (WIRE) ×2 IMPLANT
IV NS IRRIG 3000ML ARTHROMATIC (IV SOLUTION) ×3 IMPLANT
LASER FIBER DISP (UROLOGICAL SUPPLIES) IMPLANT
MANIFOLD NEPTUNE II (INSTRUMENTS) ×2 IMPLANT
MARKER SKIN DUAL TIP RULER LAB (MISCELLANEOUS) ×1 IMPLANT
PACK CYSTO (CUSTOM PROCEDURE TRAY) ×2 IMPLANT
SHEATH ACCESS URETERAL 24CM (SHEATH) ×1 IMPLANT
TUBING CONNECTING 10 (TUBING) ×2 IMPLANT

## 2013-01-26 NOTE — Preoperative (Signed)
Beta Blockers   Reason not to administer Beta Blockers:Metoprolol taken 01-26-13 at 0530

## 2013-01-26 NOTE — Anesthesia Preprocedure Evaluation (Addendum)
Anesthesia Evaluation  Patient identified by MRN, date of birth, ID band Patient awake    Reviewed: Allergy & Precautions, H&P , NPO status , Patient's Chart, lab work & pertinent test results  History of Anesthesia Complications (+) MALIGNANT HYPERTHERMIA  Airway Mallampati: III TM Distance: >3 FB Neck ROM: Full    Dental  (+) Teeth Intact and Dental Advisory Given   Pulmonary sleep apnea ,  breath sounds clear to auscultation  Pulmonary exam normal       Cardiovascular hypertension, Pt. on medications and Pt. on home beta blockers + Past MI and +CHF + dysrhythmias + Valvular Problems/Murmurs Rhythm:Regular Rate:Normal  Study Conclusions  - Left ventricle: The cavity size was normal. There was   moderate concentric hypertrophy. Systolic function was   moderately to severely reduced. The estimated ejection   fraction was in the range of 30% to 35%. - Right ventricle: The cavity size was mildly dilated. Wall   thickness was normal. - Right atrium: The atrium was mildly dilated. Transthoracic echocardiography.  M-mode, complete 2D, spectral Doppler, and color Doppler.  Height:  Height: 160cm. Height: 63in.  Weight:  Weight: 149.7kg. Weight: 329.3lb.  Body mass index:  BMI: 58.5kg/m^2.  Body surface area:    BSA: 2.78m^2.  Blood pressure:     128/98.  Patient status:  Inpatient.  Location:  Emergency department.     Neuro/Psych Depression negative neurological ROS  negative psych ROS   GI/Hepatic Neg liver ROS, GERD-  ,  Endo/Other  diabetes, Type 2, Oral Hypoglycemic AgentsMorbid obesity  Renal/GU negative Renal ROS  negative genitourinary   Musculoskeletal negative musculoskeletal ROS (+)   Abdominal (+) + obese,   Peds  Hematology negative hematology ROS (+)   Anesthesia Other Findings   Reproductive/Obstetrics                          Anesthesia Physical Anesthesia Plan  ASA:  III  Anesthesia Plan: General   Post-op Pain Management:    Induction: Intravenous  Airway Management Planned: Oral ETT  Additional Equipment:   Intra-op Plan:   Post-operative Plan: Extubation in OR  Informed Consent: I have reviewed the patients History and Physical, chart, labs and discussed the procedure including the risks, benefits and alternatives for the proposed anesthesia with the patient or authorized representative who has indicated his/her understanding and acceptance.   Dental advisory given  Plan Discussed with: CRNA  Anesthesia Plan Comments:        Anesthesia Quick Evaluation

## 2013-01-26 NOTE — Transfer of Care (Signed)
Immediate Anesthesia Transfer of Care Note  Patient: Virgel Manifold  Procedure(s) Performed: Procedure(s) with comments: CYSTOSCOPY, JJ STENT REMOVAL, LEFT URETEROSCOPY,  (Left) - CYSTO, JJ STENT REMOVAL, LEFT URETEROSCOPY  HOLMIUM LASER LITHOTRIPSY  (Left)  Patient Location: PACU  Anesthesia Type:General  Level of Consciousness: awake, alert , oriented and patient cooperative  Airway & Oxygen Therapy: Patient Spontanous Breathing and Patient connected to face mask oxygen  Post-op Assessment: Report given to PACU RN, Post -op Vital signs reviewed and stable and Patient moving all extremities  Post vital signs: Reviewed and stable  Complications: No apparent anesthesia complications

## 2013-01-26 NOTE — Progress Notes (Signed)
Notified DR Isabel Caprice of pts pain status. Dr Isabel Caprice will call Flomax prescription in [facility] pharmacy.

## 2013-01-26 NOTE — Progress Notes (Signed)
Paged Dr Isabel Caprice re: pts request for pain med prescription

## 2013-01-26 NOTE — Interval H&P Note (Signed)
History and Physical Interval Note:  01/26/2013 8:21 AM  Virgel Manifold  has presented today for surgery, with the diagnosis of LEFT URETERAL CALCULUS  The various methods of treatment have been discussed with the patient and family. After consideration of risks, benefits and other options for treatment, the patient has consented to  Procedure(s) with comments: CYSTOSCOPY, JJ STENT REMOVAL, LEFT URETEROSCOPY,  (Left) - CYSTO, JJ STENT REMOVAL, LEFT URETEROSCOPY, POSSIBLE HOLMIUM LASER LITHOTRIPSY  POSSIBLE HOLMIUM LASER LITHOTRIPSY  (Left) as a surgical intervention .  The patient's history has been reviewed, patient examined, no change in status, stable for surgery.  I have reviewed the patient's chart and labs.  Questions were answered to the patient's satisfaction.     Kamillah Didonato S

## 2013-01-26 NOTE — Op Note (Signed)
Preoperative diagnosis: Left proximal ureteral calculus, multiple left renal calculi Postoperative diagnosis: Same  Procedure: Cystoscopy, ureteroscopy rigid and flexible, holmium laser lithotripsy, basket of stone fragments   Surgeon: Valetta Fuller M.D.  Anesthesia: Gen.  Indications: Whitney [name] is a 57 year old female. She developed a left renal colic and was diagnosed with a 6 mm left proximal ureteral stone. I assessed her several weeks ago while on call and placed a double-J stent on a more urgent basis for pain management. The patient has a number of medical core morbidities and is morbidly obese. The patient subsequently was discharged home. Our initial plan was to consider ESWL but the stone could not clearly be visualized on plain imaging. Every is she presents now for definitive management of the stone. Of note she was noted to have additional stones in her lower pole of her left kidney.     Technique and findings: Patient was brought the operating room where she had successful induction of general anesthesia. She was placed in lithotomy position and prepped and draped in usual manner. She is received perioperative antibiotics and placement of compression boots. Appropriate surgical timeout was performed.  Cystoscopy revealed the stent to be in good position. He was partially removed and a guidewire was placed up the left renal pelvis. The distal ureter was engaged with a short rigid ureteroscope. Endoscopy of the ureter did not reveal an obvious stone within the ureter although I suspect it may have migrated into the renal pelvis due to the irrigant fluid. For that reason a digital flexible ureteroscope sheath was inserted. We then were able to locate multiple stones in the left kidney. This included one 5 mm stone and 16-7 mm stone as well as some smaller pieces. Holmium laser lithotripter was utilized to fracture number of the stones. Multiple calyces were involved with stones and the  procedure was tedious. We spent time of fragmenting the stones into innumerable small pieces. Larger fragments were basket extracted including several larger pieces. No obvious injury or problems to the ureter occurred. We felt that given the previous 3 weeks and double-J stent placement in the natural dilation of the ureter as well as the atraumatic procedure that we would be better off and not placing a double-J stent back in her system. The patient was brought back to recovery room in stable condition

## 2013-01-26 NOTE — Progress Notes (Signed)
Notified Dr Isabel Caprice of pts pain. Dr Isabel Caprice will call vicodin po prescription in to [facility] pharmacy on [address].

## 2013-01-26 NOTE — Anesthesia Postprocedure Evaluation (Signed)
Anesthesia Post Note  Patient: Whitney [name]  Procedure(s) Performed: Procedure(s) (LRB): CYSTOSCOPY, JJ STENT REMOVAL, LEFT URETEROSCOPY,  (Left) HOLMIUM LASER LITHOTRIPSY  (Left)  Anesthesia type: General  Patient location: PACU  Post pain: Pain level controlled  Post assessment: Post-op Vital signs reviewed  Last Vitals:  Filed Vitals:   01/26/13 1145  BP: 179/81  Pulse: 78  Temp:   Resp: 19    Post vital signs: Reviewed  Level of consciousness: sedated  Complications: No apparent anesthesia complications

## 2013-01-27 ENCOUNTER — Emergency Department (HOSPITAL_COMMUNITY)
Admission: EM | Admit: 2013-01-27 | Discharge: 2013-01-27 | Disposition: A | Discharge status: Home / Self Care | Payer: Medicare Other | Attending: Emergency Medicine | Admitting: Emergency Medicine

## 2013-01-27 ENCOUNTER — Encounter (HOSPITAL_COMMUNITY): Payer: Self-pay | Admitting: Emergency Medicine

## 2013-01-27 ENCOUNTER — Emergency Department (HOSPITAL_COMMUNITY): Payer: Medicare Other

## 2013-01-27 DIAGNOSIS — N200 Calculus of kidney: Secondary | ICD-10-CM

## 2013-01-27 DIAGNOSIS — I10 Essential (primary) hypertension: Secondary | ICD-10-CM | POA: Diagnosis not present

## 2013-01-27 DIAGNOSIS — Z8739 Personal history of other diseases of the musculoskeletal system and connective tissue: Secondary | ICD-10-CM | POA: Insufficient documentation

## 2013-01-27 DIAGNOSIS — I252 Old myocardial infarction: Secondary | ICD-10-CM | POA: Diagnosis not present

## 2013-01-27 DIAGNOSIS — Z7982 Long term (current) use of aspirin: Secondary | ICD-10-CM | POA: Insufficient documentation

## 2013-01-27 DIAGNOSIS — R1110 Vomiting, unspecified: Secondary | ICD-10-CM | POA: Diagnosis not present

## 2013-01-27 DIAGNOSIS — R109 Unspecified abdominal pain: Secondary | ICD-10-CM | POA: Diagnosis not present

## 2013-01-27 DIAGNOSIS — K219 Gastro-esophageal reflux disease without esophagitis: Secondary | ICD-10-CM | POA: Diagnosis not present

## 2013-01-27 DIAGNOSIS — N390 Urinary tract infection, site not specified: Secondary | ICD-10-CM

## 2013-01-27 DIAGNOSIS — E785 Hyperlipidemia, unspecified: Secondary | ICD-10-CM | POA: Diagnosis not present

## 2013-01-27 DIAGNOSIS — Z8669 Personal history of other diseases of the nervous system and sense organs: Secondary | ICD-10-CM | POA: Diagnosis not present

## 2013-01-27 DIAGNOSIS — Z79899 Other long term (current) drug therapy: Secondary | ICD-10-CM | POA: Insufficient documentation

## 2013-01-27 DIAGNOSIS — R011 Cardiac murmur, unspecified: Secondary | ICD-10-CM | POA: Diagnosis not present

## 2013-01-27 DIAGNOSIS — N201 Calculus of ureter: Secondary | ICD-10-CM | POA: Diagnosis not present

## 2013-01-27 DIAGNOSIS — E119 Type 2 diabetes mellitus without complications: Secondary | ICD-10-CM | POA: Insufficient documentation

## 2013-01-27 DIAGNOSIS — F329 Major depressive disorder, single episode, unspecified: Secondary | ICD-10-CM | POA: Diagnosis not present

## 2013-01-27 DIAGNOSIS — R1032 Left lower quadrant pain: Secondary | ICD-10-CM | POA: Diagnosis not present

## 2013-01-27 DIAGNOSIS — Z8674 Personal history of sudden cardiac arrest: Secondary | ICD-10-CM | POA: Diagnosis not present

## 2013-01-27 DIAGNOSIS — K2990 Gastroduodenitis, unspecified, without bleeding: Secondary | ICD-10-CM | POA: Diagnosis not present

## 2013-01-27 DIAGNOSIS — F3289 Other specified depressive episodes: Secondary | ICD-10-CM | POA: Insufficient documentation

## 2013-01-27 LAB — URINALYSIS, MICROSCOPIC ONLY
Bilirubin Urine: NEGATIVE
Ketones, ur: NEGATIVE mg/dL
Specific Gravity, Urine: 1.014 (ref 1.005–1.030)
Urobilinogen, UA: 0.2 mg/dL (ref 0.0–1.0)

## 2013-01-27 LAB — COMPREHENSIVE METABOLIC PANEL
ALT: 14 U/L (ref 0–35)
AST: 25 U/L (ref 0–37)
Alkaline Phosphatase: 119 U/L — ABNORMAL HIGH (ref 39–117)
CO2: 27 mEq/L (ref 19–32)
Calcium: 9.7 mg/dL (ref 8.4–10.5)
Chloride: 90 mEq/L — ABNORMAL LOW (ref 96–112)
GFR calc Af Amer: >90 mL/min (ref >90)
GFR calc non Af Amer: >90 mL/min (ref >90)
Glucose, Bld: 184 mg/dL — ABNORMAL HIGH (ref 70–99)
Potassium: 4.3 mEq/L (ref 3.5–5.1)
Sodium: 129 mEq/L — ABNORMAL LOW (ref 135–145)
Total Bilirubin: 1.3 mg/dL — ABNORMAL HIGH (ref 0.3–1.2)

## 2013-01-27 LAB — CBC WITH DIFFERENTIAL/PLATELET
Basophils Absolute: 0 10*3/uL (ref 0.0–0.1)
Lymphocytes Relative: 5 % — ABNORMAL LOW (ref 12–46)
Lymphs Abs: 1.1 10*3/uL (ref 0.7–4.0)
MCV: 80 fL (ref 78.0–100.0)
Neutro Abs: 18.4 10*3/uL — ABNORMAL HIGH (ref 1.7–7.7)
Neutrophils Relative %: 88 % — ABNORMAL HIGH (ref 43–77)
Platelets: 323 10*3/uL (ref 150–400)
RBC: 4.51 MIL/uL (ref 3.87–5.11)
RDW: 13.9 % (ref 11.5–15.5)
WBC: 21.1 10*3/uL — ABNORMAL HIGH (ref 4.0–10.5)

## 2013-01-27 LAB — POCT I-STAT TROPONIN I

## 2013-01-27 MED ORDER — CIPROFLOXACIN HCL 500 MG PO TABS: 500.0000 mg | ORAL_TABLET | Freq: Two times a day (BID) | ORAL | Status: DC

## 2013-01-27 MED ORDER — CIPROFLOXACIN HCL 500 MG PO TABS
500.0000 mg | ORAL_TABLET | Freq: Once | ORAL | Status: AC
  Administered 2013-01-27: 500 mg via ORAL
  Filled 2013-01-27: qty 1

## 2013-01-27 MED ORDER — HYDROMORPHONE HCL PF 1 MG/ML IJ SOLN
1.0000 mg | Freq: Once | INTRAMUSCULAR | Status: AC
  Administered 2013-01-27: 1 mg via INTRAVENOUS
  Filled 2013-01-27: qty 1

## 2013-01-27 MED ORDER — ONDANSETRON HCL 4 MG/2ML IJ SOLN
4.0000 mg | Freq: Once | INTRAMUSCULAR | Status: AC
  Administered 2013-01-27: 4 mg via INTRAVENOUS
  Filled 2013-01-27: qty 2

## 2013-01-27 NOTE — ED Notes (Signed)
Pt from home.  Had surgery on kidney yesterday for kidney stone.  Pt states she has had LUQ pain since last night, has also had 8 episodes emesis since last night.  LBM Sunday, denies changes in urination, has had some blood in urine.

## 2013-01-27 NOTE — ED Notes (Signed)
MD at bedside. 

## 2013-01-27 NOTE — ED Notes (Signed)
[id number]<AV> Expected date:<BR> Expected time:<BR> Means of arrival:<BR> Comments:<BR> EMS- N/V, post op kidney removeal

## 2013-01-27 NOTE — ED Provider Notes (Signed)
History     CSN: [id number]  Arrival date & time 01/27/13  7829   First MD Initiated Contact with Patient 01/27/13 [phone]      Chief Complaint  Patient presents with  . Abdominal Pain    (Consider location/radiation/quality/duration/timing/severity/associated sxs/prior treatment) HPI..... sharp left flank pain radiating to left lateral abdomen several hours ago.  Status post urological procedure yesterday to remove kidney stones via laser by Dr. Isabel Caprice.   Severity is moderate.   Nothing makes symptoms better or worse. No other radiation.    No fever, chills, hematuria  Past Medical History  Diagnosis Date  . Diabetes mellitus   . Hypertension   . GERD (gastroesophageal reflux disease)   . Myocardial infarction 2013  . Cardiac arrest   . Heart murmur   . Sleep apnea     Bring machine,mask and tubing  . Arthritis     left knee,right hip  . H/O cardiac arrest 11/2011  . Hyperlipidemia   . Glaucoma   . Depression     Past Surgical History  Procedure Laterality Date  . Back surgery    . Partial hysterectomy    . Knee surgery      left  . Hernia repair    . Foot surgery    . Cystoscopy w/ ureteral stent placement Left 01/04/2013    Procedure: CYSTOSCOPY WITH RETROGRADE PYELOGRAM/URETERAL STENT PLACEMENT;  Surgeon: Valetta Fuller, MD;  Location: [facility]S;  Service: Urology;  Laterality: Left;    Family History  Problem Relation Age of Onset  . Asthma Mother     History  Substance Use Topics  . Smoking status: Never Smoker   . Smokeless tobacco: Never Used  . Alcohol Use: No    OB History   Grav Para Term Preterm Abortions TAB SAB Ect Mult Living                  Review of Systems  All other systems reviewed and are negative.    Allergies  Contrast media  Home Medications   Current Outpatient Rx  Name  Route  Sig  Dispense  Refill  . aspirin 81 MG tablet   Oral   Take 81 mg by mouth daily.         . bimatoprost (LUMIGAN) 0.03 % ophthalmic solution   Both Eyes   Place 1 drop into both eyes at bedtime.         . furosemide (LASIX) 40 MG tablet   Oral   Take 40 mg by mouth daily before breakfast.         . HYDROcodone-acetaminophen (NORCO/VICODIN) 5-325 MG per tablet   Oral   Take 1-2 tablets by mouth every 6 (six) hours as needed for pain.   20 tablet   0   . lisinopril (PRINIVIL,ZESTRIL) 20 MG tablet   Oral   Take 20 mg by mouth daily before breakfast.          . metoprolol succinate (TOPROL-XL) 100 MG 24 hr tablet   Oral   Take 100 mg by mouth daily before breakfast. Take with or immediately following a meal.         . oxybutynin (DITROPAN-XL) 5 MG 24 hr tablet   Oral   Take 5 mg by mouth daily.         . pantoprazole (PROTONIX) 40 MG tablet   Oral   Take 40 mg by mouth daily.         Marland Kitchen  pravastatin (PRAVACHOL) 40 MG tablet   Oral   Take 40 mg by mouth at bedtime.          . traMADol (ULTRAM) 50 MG tablet   Oral   Take 50 mg by mouth every 6 (six) hours as needed. For pain         . glipiZIDE (GLUCOTROL) 5 MG tablet   Oral   Take 2.5 mg by mouth daily before breakfast. Takes 1/2 tablet           BP 161/73  Pulse 64  Temp(Src) 98.2 F (36.8 C) (Oral)  Resp 24  SpO2 99%  Physical Exam  Nursing note and vitals reviewed. Constitutional: She is oriented to person, place, and time.  Obese  HENT:  Head: Normocephalic and atraumatic.  Eyes: Conjunctivae and EOM are normal. Pupils are equal, round, and reactive to light.  Neck: Normal range of motion. Neck supple.  Cardiovascular: Normal rate, regular rhythm and normal heart sounds.   Pulmonary/Chest: Effort normal and breath sounds normal.  Abdominal:  Minimal tenderness left flank and left lower quad  Musculoskeletal: Normal range of motion.  Neurological: She is alert and oriented to person, place, and time.  Skin: Skin is warm and dry.  Psychiatric: She has a normal mood and affect.    ED Course  Procedures (including critical  care time)  Labs Reviewed  CBC WITH DIFFERENTIAL - Abnormal; Notable for the following:    WBC 21.1 (*)    Neutrophils Relative 88 (*)    Neutro Abs 18.4 (*)    Lymphocytes Relative 5 (*)    Monocytes Absolute 1.5 (*)    All other components within normal limits  URINALYSIS, MICROSCOPIC ONLY - Abnormal; Notable for the following:    APPearance CLOUDY (*)    Hgb urine dipstick LARGE (*)    Protein, ur 30 (*)    Leukocytes, UA SMALL (*)    Bacteria, UA FEW (*)    Squamous Epithelial / LPF FEW (*)    All other components within normal limits  URINE CULTURE  COMPREHENSIVE METABOLIC PANEL   No results found.   No diagnosis found.  Date: 01/27/2013  Rate: 62  Rhythm: normal sinus rhythm  QRS Axis: normal  Intervals: normal  ST/T Wave abnormalities: normal  Conduction Disutrbances:none  Narrative Interpretation:   Old EKG Reviewed: changes noted PAC's   MDM  CT scan reveals moderate left hydronephrosis and perinephric stranding without obstructing stone.   Patient feels much better after pain management. Discussed with Dr. Isabel Caprice.  He will followup as outpatient       Donnetta Hutching, MD 01/30/13 1630

## 2013-01-30 LAB — URINE CULTURE

## 2013-01-31 ENCOUNTER — Telehealth (HOSPITAL_COMMUNITY): Payer: Self-pay | Admitting: Emergency Medicine

## 2013-01-31 NOTE — ED Notes (Signed)
+  Urine. Patient given Cipro. No sensitivity listed. Chart sent to EDP office for review. °

## 2013-01-31 NOTE — ED Notes (Signed)
Patient has +Urine culture. °

## 2013-02-01 ENCOUNTER — Telehealth (HOSPITAL_COMMUNITY): Payer: Self-pay | Admitting: Emergency Medicine

## 2013-02-01 NOTE — ED Notes (Signed)
LVM requesting callback.  Results faxed to Dr Isabel Caprice @ [phone].

## 2013-02-01 NOTE — ED Notes (Signed)
Chart returned from EDP office. Per Trixie Dredge PA-C, please forward to patient's urologist. Patient continue taking Cipro if improving, if not, change to Levaquin 750 mg daily x 5 days.

## 2013-02-03 NOTE — ED Notes (Signed)
Patient is feeling better.

## 2013-02-09 DIAGNOSIS — N201 Calculus of ureter: Secondary | ICD-10-CM | POA: Diagnosis not present

## 2013-04-16 DIAGNOSIS — E7800 Pure hypercholesterolemia, unspecified: Secondary | ICD-10-CM | POA: Diagnosis not present

## 2013-04-16 DIAGNOSIS — I10 Essential (primary) hypertension: Secondary | ICD-10-CM | POA: Diagnosis not present

## 2013-04-16 DIAGNOSIS — I5022 Chronic systolic (congestive) heart failure: Secondary | ICD-10-CM | POA: Diagnosis not present

## 2013-04-22 DIAGNOSIS — E7800 Pure hypercholesterolemia, unspecified: Secondary | ICD-10-CM | POA: Diagnosis not present

## 2013-04-22 DIAGNOSIS — I10 Essential (primary) hypertension: Secondary | ICD-10-CM | POA: Diagnosis not present

## 2013-04-22 DIAGNOSIS — K219 Gastro-esophageal reflux disease without esophagitis: Secondary | ICD-10-CM | POA: Diagnosis not present

## 2013-04-22 DIAGNOSIS — E119 Type 2 diabetes mellitus without complications: Secondary | ICD-10-CM | POA: Diagnosis not present

## 2013-04-22 DIAGNOSIS — N318 Other neuromuscular dysfunction of bladder: Secondary | ICD-10-CM | POA: Diagnosis not present

## 2013-06-16 DIAGNOSIS — R609 Edema, unspecified: Secondary | ICD-10-CM | POA: Diagnosis not present

## 2013-06-16 DIAGNOSIS — I10 Essential (primary) hypertension: Secondary | ICD-10-CM | POA: Diagnosis not present

## 2013-06-16 DIAGNOSIS — L02419 Cutaneous abscess of limb, unspecified: Secondary | ICD-10-CM | POA: Diagnosis not present

## 2013-07-13 ENCOUNTER — Telehealth: Payer: Self-pay | Admitting: Cardiology

## 2013-07-13 DIAGNOSIS — R002 Palpitations: Secondary | ICD-10-CM

## 2013-07-13 NOTE — Telephone Encounter (Signed)
New message   C/o  Palpitations at center of chest---going on for 1wk

## 2013-07-27 DIAGNOSIS — R079 Chest pain, unspecified: Secondary | ICD-10-CM | POA: Diagnosis not present

## 2013-07-27 DIAGNOSIS — Z23 Encounter for immunization: Secondary | ICD-10-CM | POA: Diagnosis not present

## 2013-07-27 DIAGNOSIS — F4321 Adjustment disorder with depressed mood: Secondary | ICD-10-CM | POA: Diagnosis not present

## 2013-08-14 ENCOUNTER — Ambulatory Visit: Payer: Medicare Other | Admitting: Cardiology

## 2013-09-08 DIAGNOSIS — E119 Type 2 diabetes mellitus without complications: Secondary | ICD-10-CM | POA: Diagnosis not present

## 2013-09-08 DIAGNOSIS — H409 Unspecified glaucoma: Secondary | ICD-10-CM | POA: Diagnosis not present

## 2013-09-08 DIAGNOSIS — H4011X0 Primary open-angle glaucoma, stage unspecified: Secondary | ICD-10-CM | POA: Diagnosis not present

## 2013-10-05 DIAGNOSIS — M1710 Unilateral primary osteoarthritis, unspecified knee: Secondary | ICD-10-CM | POA: Diagnosis not present

## 2013-11-11 DIAGNOSIS — E7800 Pure hypercholesterolemia, unspecified: Secondary | ICD-10-CM | POA: Diagnosis not present

## 2013-11-11 DIAGNOSIS — IMO0002 Reserved for concepts with insufficient information to code with codable children: Secondary | ICD-10-CM | POA: Diagnosis not present

## 2013-11-11 DIAGNOSIS — M1710 Unilateral primary osteoarthritis, unspecified knee: Secondary | ICD-10-CM | POA: Diagnosis not present

## 2013-11-11 DIAGNOSIS — E119 Type 2 diabetes mellitus without complications: Secondary | ICD-10-CM | POA: Diagnosis not present

## 2013-11-11 DIAGNOSIS — I10 Essential (primary) hypertension: Secondary | ICD-10-CM | POA: Diagnosis not present

## 2013-12-15 DIAGNOSIS — H409 Unspecified glaucoma: Secondary | ICD-10-CM | POA: Diagnosis not present

## 2013-12-15 DIAGNOSIS — H4011X0 Primary open-angle glaucoma, stage unspecified: Secondary | ICD-10-CM | POA: Diagnosis not present

## 2014-03-08 DIAGNOSIS — I10 Essential (primary) hypertension: Secondary | ICD-10-CM | POA: Diagnosis not present

## 2014-03-08 DIAGNOSIS — E7800 Pure hypercholesterolemia, unspecified: Secondary | ICD-10-CM | POA: Diagnosis not present

## 2014-03-08 DIAGNOSIS — IMO0001 Reserved for inherently not codable concepts without codable children: Secondary | ICD-10-CM | POA: Diagnosis not present

## 2014-03-08 DIAGNOSIS — Z6841 Body Mass Index (BMI) 40.0 and over, adult: Secondary | ICD-10-CM | POA: Diagnosis not present

## 2014-03-08 DIAGNOSIS — R748 Abnormal levels of other serum enzymes: Secondary | ICD-10-CM | POA: Diagnosis not present

## 2014-03-24 ENCOUNTER — Other Ambulatory Visit: Payer: Self-pay

## 2014-03-24 DIAGNOSIS — Z1231 Encounter for screening mammogram for malignant neoplasm of breast: Secondary | ICD-10-CM

## 2014-04-08 ENCOUNTER — Ambulatory Visit
Admission: RE | Admit: 2014-04-08 | Discharge: 2014-04-08 | Disposition: A | Discharge status: Home / Self Care | Payer: Medicare Other | Source: Ambulatory Visit

## 2014-04-08 ENCOUNTER — Encounter (INDEPENDENT_AMBULATORY_CARE_PROVIDER_SITE_OTHER): Payer: Self-pay

## 2014-04-08 DIAGNOSIS — Z1231 Encounter for screening mammogram for malignant neoplasm of breast: Secondary | ICD-10-CM | POA: Diagnosis not present

## 2014-04-09 ENCOUNTER — Other Ambulatory Visit: Payer: Self-pay | Admitting: Family Medicine

## 2014-04-09 DIAGNOSIS — R928 Other abnormal and inconclusive findings on diagnostic imaging of breast: Secondary | ICD-10-CM

## 2014-04-23 ENCOUNTER — Ambulatory Visit
Admission: RE | Admit: 2014-04-23 | Discharge: 2014-04-23 | Disposition: A | Discharge status: Home / Self Care | Payer: Medicare Other | Source: Ambulatory Visit | Attending: Family Medicine | Admitting: Family Medicine

## 2014-04-23 DIAGNOSIS — D249 Benign neoplasm of unspecified breast: Secondary | ICD-10-CM | POA: Diagnosis not present

## 2014-04-23 DIAGNOSIS — R928 Other abnormal and inconclusive findings on diagnostic imaging of breast: Secondary | ICD-10-CM

## 2014-06-14 DIAGNOSIS — Z6841 Body Mass Index (BMI) 40.0 and over, adult: Secondary | ICD-10-CM | POA: Diagnosis not present

## 2014-06-14 DIAGNOSIS — I10 Essential (primary) hypertension: Secondary | ICD-10-CM | POA: Diagnosis not present

## 2014-06-14 DIAGNOSIS — IMO0001 Reserved for inherently not codable concepts without codable children: Secondary | ICD-10-CM | POA: Diagnosis not present

## 2014-06-14 DIAGNOSIS — Z23 Encounter for immunization: Secondary | ICD-10-CM | POA: Diagnosis not present

## 2014-06-14 DIAGNOSIS — I5022 Chronic systolic (congestive) heart failure: Secondary | ICD-10-CM | POA: Diagnosis not present

## 2014-06-14 DIAGNOSIS — K219 Gastro-esophageal reflux disease without esophagitis: Secondary | ICD-10-CM | POA: Diagnosis not present

## 2014-06-14 DIAGNOSIS — E7800 Pure hypercholesterolemia, unspecified: Secondary | ICD-10-CM | POA: Diagnosis not present

## 2014-06-14 DIAGNOSIS — N318 Other neuromuscular dysfunction of bladder: Secondary | ICD-10-CM | POA: Diagnosis not present

## 2014-07-28 ENCOUNTER — Encounter: Payer: Self-pay | Admitting: Podiatrist

## 2014-07-28 ENCOUNTER — Ambulatory Visit (INDEPENDENT_AMBULATORY_CARE_PROVIDER_SITE_OTHER): Payer: Medicare Other | Admitting: Podiatrist

## 2014-07-28 DIAGNOSIS — M79676 Pain in unspecified toe(s): Secondary | ICD-10-CM

## 2014-07-28 DIAGNOSIS — B351 Tinea unguium: Secondary | ICD-10-CM

## 2014-07-28 NOTE — Progress Notes (Signed)
   Subjective:    Patient ID: Whitney [name], female    DOB: 07/03/1956, 58 y.o.   MRN: [id number]  HPI  Toenails  Trim  and rt 5th toe have corn  Review of Systems  HENT: Positive for sinus pressure.   Gastrointestinal: Positive for constipation.  Musculoskeletal: Positive for gait problem, joint swelling and myalgias.  Skin: Positive for color change.  Hematological: Bruises/bleeds easily.  All other systems reviewed and are negative.      Objective:   Physical Exam GENERAL APPEARANCE: Alert, conversant. Appropriately groomed. No acute distress.  VASCULAR: Pedal pulses palpable at 2/4 DP and PT bilateral.  Capillary refill time is immediate to all digits,  Proximal to distal cooling it warm to warm.  Digital hair growth is present bilateral  NEUROLOGIC: sensation is intact epicritically and protectively to 5.07 monofilament at 3/5 sites bilateral.  Light touch is decreased bilateral, vibratory sensation intact bilateral,  MUSCULOSKELETAL: acceptable muscle strength, tone and stability bilateral. DERMATOLOGIC:toenails elongated, thickened, discolored, distrophic.  Corn on the right fifth toe is painful.       Assessment & Plan:  Diabetes, neuropathy, symptomatic toenails, corn right fifth toe.   Plan:  Debridement of toenails accomplished today. Corn was shaved without complication. Gave information about diabetic foot care.

## 2014-07-28 NOTE — Patient Instructions (Signed)
Diabetes and Foot Care Diabetes may cause you to have problems because of poor blood supply (circulation) to your feet and legs. This may cause the skin on your feet to become thinner, break easier, and heal more slowly. Your skin may become dry, and the skin may peel and crack. You may also have nerve damage in your legs and feet causing decreased feeling in them. You may not notice minor injuries to your feet that could lead to infections or more serious problems. Taking care of your feet is one of the most important things you can do for yourself.  HOME CARE INSTRUCTIONS  Wear shoes at all times, even in the house. Do not go barefoot. Bare feet are easily injured.  Check your feet daily for blisters, cuts, and redness. If you cannot see the bottom of your feet, use a mirror or ask someone for help.  Wash your feet with warm water (do not use hot water) and mild soap. Then pat your feet and the areas between your toes until they are completely dry. Do not soak your feet as this can dry your skin.  Apply a moisturizing lotion or petroleum jelly (that does not contain alcohol and is unscented) to the skin on your feet and to dry, brittle toenails. Do not apply lotion between your toes.  Trim your toenails straight across. Do not dig under them or around the cuticle. File the edges of your nails with an emery board or nail file.  Do not cut corns or calluses or try to remove them with medicine.  Wear clean socks or stockings every day. Make sure they are not too tight. Do not wear knee-high stockings since they may decrease blood flow to your legs.  Wear shoes that fit properly and have enough cushioning. To break in new shoes, wear them for just a few hours a day. This prevents you from injuring your feet. Always look in your shoes before you put them on to be sure there are no objects inside.  Do not cross your legs. This may decrease the blood flow to your feet.  If you find a minor scrape,  cut, or break in the skin on your feet, keep it and the skin around it clean and dry. These areas may be cleansed with mild soap and water. Do not cleanse the area with peroxide, alcohol, or iodine.  When you remove an adhesive bandage, be sure not to damage the skin around it.  If you have a wound, look at it several times a day to make sure it is healing.  Do not use heating pads or hot water bottles. They may burn your skin. If you have lost feeling in your feet or legs, you may not know it is happening until it is too late.  Make sure your health care provider performs a complete foot exam at least annually or more often if you have foot problems. Report any cuts, sores, or bruises to your health care provider immediately. SEEK MEDICAL CARE IF:   You have an injury that is not healing.  You have cuts or breaks in the skin.  You have an ingrown nail.  You notice redness on your legs or feet.  You feel burning or tingling in your legs or feet.  You have pain or cramps in your legs and feet.  Your legs or feet are numb.  Your feet always feel cold. SEEK IMMEDIATE MEDICAL CARE IF:   There is increasing redness,   swelling, or pain in or around a wound.  There is a red line that goes up your leg.  Pus is coming from a wound.  You develop a fever or as directed by your health care provider.  You notice a bad smell coming from an ulcer or wound. Document Released: 09/14/2000 Document Revised: 05/20/2013 Document Reviewed: 02/24/2013 ExitCare Patient Information 2015 [employer]. This information is not intended to replace advice given to you by your health care provider. Make sure you discuss any questions you have with your health care provider.  

## 2014-08-02 DIAGNOSIS — L089 Local infection of the skin and subcutaneous tissue, unspecified: Secondary | ICD-10-CM | POA: Diagnosis not present

## 2014-08-02 DIAGNOSIS — R002 Palpitations: Secondary | ICD-10-CM | POA: Diagnosis not present

## 2014-08-02 DIAGNOSIS — E119 Type 2 diabetes mellitus without complications: Secondary | ICD-10-CM | POA: Diagnosis not present

## 2014-08-02 DIAGNOSIS — R202 Paresthesia of skin: Secondary | ICD-10-CM | POA: Diagnosis not present

## 2014-08-04 DIAGNOSIS — R609 Edema, unspecified: Secondary | ICD-10-CM | POA: Diagnosis not present

## 2014-08-04 DIAGNOSIS — E1165 Type 2 diabetes mellitus with hyperglycemia: Secondary | ICD-10-CM | POA: Diagnosis not present

## 2014-08-04 DIAGNOSIS — I10 Essential (primary) hypertension: Secondary | ICD-10-CM | POA: Diagnosis not present

## 2014-08-06 DIAGNOSIS — L089 Local infection of the skin and subcutaneous tissue, unspecified: Secondary | ICD-10-CM | POA: Diagnosis not present

## 2014-08-11 DIAGNOSIS — E1165 Type 2 diabetes mellitus with hyperglycemia: Secondary | ICD-10-CM | POA: Diagnosis not present

## 2014-08-11 DIAGNOSIS — L089 Local infection of the skin and subcutaneous tissue, unspecified: Secondary | ICD-10-CM | POA: Diagnosis not present

## 2014-08-11 DIAGNOSIS — R609 Edema, unspecified: Secondary | ICD-10-CM | POA: Diagnosis not present

## 2014-08-16 DIAGNOSIS — R609 Edema, unspecified: Secondary | ICD-10-CM | POA: Diagnosis not present

## 2014-08-19 DIAGNOSIS — R609 Edema, unspecified: Secondary | ICD-10-CM | POA: Diagnosis not present

## 2014-08-19 DIAGNOSIS — G4733 Obstructive sleep apnea (adult) (pediatric): Secondary | ICD-10-CM | POA: Diagnosis not present

## 2014-09-06 DIAGNOSIS — B301 Conjunctivitis due to adenovirus: Secondary | ICD-10-CM | POA: Diagnosis not present

## 2014-10-11 ENCOUNTER — Ambulatory Visit: Payer: Medicare Other | Admitting: Pulmonary Disease

## 2014-10-11 DIAGNOSIS — E780 Pure hypercholesterolemia: Secondary | ICD-10-CM | POA: Diagnosis not present

## 2014-10-11 DIAGNOSIS — E1165 Type 2 diabetes mellitus with hyperglycemia: Secondary | ICD-10-CM | POA: Diagnosis not present

## 2014-10-11 DIAGNOSIS — K219 Gastro-esophageal reflux disease without esophagitis: Secondary | ICD-10-CM | POA: Diagnosis not present

## 2014-10-11 DIAGNOSIS — I10 Essential (primary) hypertension: Secondary | ICD-10-CM | POA: Diagnosis not present

## 2014-10-11 DIAGNOSIS — I5022 Chronic systolic (congestive) heart failure: Secondary | ICD-10-CM | POA: Diagnosis not present

## 2015-02-02 DIAGNOSIS — E780 Pure hypercholesterolemia: Secondary | ICD-10-CM | POA: Diagnosis not present

## 2015-02-02 DIAGNOSIS — K219 Gastro-esophageal reflux disease without esophagitis: Secondary | ICD-10-CM | POA: Diagnosis not present

## 2015-02-02 DIAGNOSIS — I10 Essential (primary) hypertension: Secondary | ICD-10-CM | POA: Diagnosis not present

## 2015-02-02 DIAGNOSIS — E1165 Type 2 diabetes mellitus with hyperglycemia: Secondary | ICD-10-CM | POA: Diagnosis not present

## 2015-02-02 DIAGNOSIS — I5022 Chronic systolic (congestive) heart failure: Secondary | ICD-10-CM | POA: Diagnosis not present

## 2015-02-04 DIAGNOSIS — H10413 Chronic giant papillary conjunctivitis, bilateral: Secondary | ICD-10-CM | POA: Diagnosis not present

## 2015-02-08 DIAGNOSIS — H10413 Chronic giant papillary conjunctivitis, bilateral: Secondary | ICD-10-CM | POA: Diagnosis not present

## 2015-02-08 DIAGNOSIS — H4011X1 Primary open-angle glaucoma, mild stage: Secondary | ICD-10-CM | POA: Diagnosis not present

## 2015-03-08 DIAGNOSIS — H4011X1 Primary open-angle glaucoma, mild stage: Secondary | ICD-10-CM | POA: Diagnosis not present

## 2015-03-08 DIAGNOSIS — H2513 Age-related nuclear cataract, bilateral: Secondary | ICD-10-CM | POA: Diagnosis not present

## 2015-03-08 DIAGNOSIS — H04123 Dry eye syndrome of bilateral lacrimal glands: Secondary | ICD-10-CM | POA: Diagnosis not present

## 2015-05-24 ENCOUNTER — Encounter: Payer: Self-pay | Admitting: Podiatry

## 2015-05-24 ENCOUNTER — Ambulatory Visit (INDEPENDENT_AMBULATORY_CARE_PROVIDER_SITE_OTHER): Payer: Medicare Other | Admitting: Podiatry

## 2015-05-24 DIAGNOSIS — M79676 Pain in unspecified toe(s): Secondary | ICD-10-CM | POA: Diagnosis not present

## 2015-05-24 DIAGNOSIS — B351 Tinea unguium: Secondary | ICD-10-CM | POA: Diagnosis not present

## 2015-05-24 DIAGNOSIS — Q828 Other specified congenital malformations of skin: Secondary | ICD-10-CM

## 2015-05-24 NOTE — Progress Notes (Signed)
Patient ID: Whitney [name], female   DOB: 08-20-1956, 59 y.o.   MRN: [id number] Complaint:  Visit Type: Patient returns to my office for continued preventative foot care services. Complaint: Patient states" my nails have grown long and thick and become painful to walk and wear shoes" Patient has been diagnosed with DM with no foot complications. The patient presents for preventative foot care services. No changes to ROS  Podiatric Exam: Vascular: dorsalis pedis  pulses are palpable bilateral. Her posterior tibial pulses are not palpable due to ankle swelling. Capillary return is immediate. Temperature gradient is WNL. Skin turgor WNL  Sensorium: Normal Semmes Weinstein monofilament test. Normal tactile sensation bilaterally. Nail Exam: Pt has thick disfigured discolored nails with subungual debris noted bilateral entire nail hallux through fifth toenails Ulcer Exam: There is no evidence of ulcer or pre-ulcerative changes or infection. Orthopedic Exam: Muscle tone and strength are WNL. No limitations in general ROM. No crepitus or effusions noted. Foot type and digits show no abnormalities. Bony prominences are unremarkable. Skin: No Porokeratosis. No infection or ulcers.  Porokeratosis sub5th right.  Diagnosis:  Onychomycosis, , Pain in right toe, pain in left toes,  Debride porokeratosis  Treatment & Plan Procedures and Treatment: Consent by patient was obtained for treatment procedures. The patient understood the discussion of treatment and procedures well. All questions were answered thoroughly reviewed. Debridement of mycotic and hypertrophic toenails, 1 through 5 bilateral and clearing of subungual debris. No ulceration, no infection noted.  Return Visit-Office Procedure: Patient instructed to return to the office for a follow up visit 3 months for continued evaluation and treatment.

## 2015-07-01 DIAGNOSIS — H4011X1 Primary open-angle glaucoma, mild stage: Secondary | ICD-10-CM | POA: Diagnosis not present

## 2015-07-01 DIAGNOSIS — H04123 Dry eye syndrome of bilateral lacrimal glands: Secondary | ICD-10-CM | POA: Diagnosis not present

## 2015-07-21 DIAGNOSIS — E7800 Pure hypercholesterolemia, unspecified: Secondary | ICD-10-CM | POA: Diagnosis not present

## 2015-07-21 DIAGNOSIS — I10 Essential (primary) hypertension: Secondary | ICD-10-CM | POA: Diagnosis not present

## 2015-07-21 DIAGNOSIS — Z23 Encounter for immunization: Secondary | ICD-10-CM | POA: Diagnosis not present

## 2015-07-21 DIAGNOSIS — E1165 Type 2 diabetes mellitus with hyperglycemia: Secondary | ICD-10-CM | POA: Diagnosis not present

## 2015-07-21 DIAGNOSIS — I5022 Chronic systolic (congestive) heart failure: Secondary | ICD-10-CM | POA: Diagnosis not present

## 2015-07-21 DIAGNOSIS — M179 Osteoarthritis of knee, unspecified: Secondary | ICD-10-CM | POA: Diagnosis not present

## 2015-09-20 ENCOUNTER — Ambulatory Visit (INDEPENDENT_AMBULATORY_CARE_PROVIDER_SITE_OTHER): Payer: Medicare Other | Admitting: Podiatry

## 2015-09-20 ENCOUNTER — Ambulatory Visit: Payer: Medicare Other | Admitting: Podiatry

## 2015-09-20 DIAGNOSIS — Q828 Other specified congenital malformations of skin: Secondary | ICD-10-CM

## 2015-09-20 DIAGNOSIS — M79676 Pain in unspecified toe(s): Secondary | ICD-10-CM

## 2015-09-20 DIAGNOSIS — B351 Tinea unguium: Secondary | ICD-10-CM | POA: Diagnosis not present

## 2015-09-20 NOTE — Progress Notes (Signed)
Patient ID: Whitney [name], female   DOB: [birth year], 58 y.o.   MRN: [id number] Complaint:  Visit Type: Patient returns to my office for continued preventative foot care services. Complaint: Patient states" my nails have grown long and thick and become painful to walk and wear shoes" Patient has been diagnosed with DM with no foot complications. The patient presents for preventative foot care services. No changes to ROS  Podiatric Exam: Vascular: dorsalis pedis  pulses are palpable bilateral. Her posterior tibial pulses are not palpable due to ankle swelling. Capillary return is immediate. Temperature gradient is WNL. Skin turgor WNL  Sensorium: Normal Semmes Weinstein monofilament test. Normal tactile sensation bilaterally. Nail Exam: Pt has thick disfigured discolored nails with subungual debris noted bilateral entire nail hallux through fifth toenails Ulcer Exam: There is no evidence of ulcer or pre-ulcerative changes or infection. Orthopedic Exam: Muscle tone and strength are WNL. No limitations in general ROM. No crepitus or effusions noted. Foot type and digits show no abnormalities. Bony prominences are unremarkable. Skin: No Porokeratosis. No infection or ulcers.  Porokeratosis sub 5 th bilateral..  Diagnosis:  Onychomycosis, , Pain in right toe, pain in left toes,  Debride porokeratosis  Treatment & Plan Procedures and Treatment: Consent by patient was obtained for treatment procedures. The patient understood the discussion of treatment and procedures well. All questions were answered thoroughly reviewed. Debridement of mycotic and hypertrophic toenails, 1 through 5 bilateral and clearing of subungual debris. No ulceration, no infection noted.  Return Visit-Office Procedure: Patient instructed to return to the office for a follow up visit 3 months for continued evaluation and treatment.  Gardiner Barefoot DPM

## 2015-11-07 DIAGNOSIS — H401131 Primary open-angle glaucoma, bilateral, mild stage: Secondary | ICD-10-CM | POA: Diagnosis not present

## 2015-11-07 DIAGNOSIS — H04123 Dry eye syndrome of bilateral lacrimal glands: Secondary | ICD-10-CM | POA: Diagnosis not present

## 2015-11-16 DIAGNOSIS — E7800 Pure hypercholesterolemia, unspecified: Secondary | ICD-10-CM | POA: Diagnosis not present

## 2015-11-16 DIAGNOSIS — I349 Nonrheumatic mitral valve disorder, unspecified: Secondary | ICD-10-CM | POA: Diagnosis not present

## 2015-11-16 DIAGNOSIS — R928 Other abnormal and inconclusive findings on diagnostic imaging of breast: Secondary | ICD-10-CM | POA: Diagnosis not present

## 2015-11-16 DIAGNOSIS — E1165 Type 2 diabetes mellitus with hyperglycemia: Secondary | ICD-10-CM | POA: Diagnosis not present

## 2015-11-16 DIAGNOSIS — I10 Essential (primary) hypertension: Secondary | ICD-10-CM | POA: Diagnosis not present

## 2015-11-16 DIAGNOSIS — G4733 Obstructive sleep apnea (adult) (pediatric): Secondary | ICD-10-CM | POA: Diagnosis not present

## 2015-11-16 DIAGNOSIS — M170 Bilateral primary osteoarthritis of knee: Secondary | ICD-10-CM | POA: Diagnosis not present

## 2015-11-16 DIAGNOSIS — R21 Rash and other nonspecific skin eruption: Secondary | ICD-10-CM | POA: Diagnosis not present

## 2015-11-16 DIAGNOSIS — I5022 Chronic systolic (congestive) heart failure: Secondary | ICD-10-CM | POA: Diagnosis not present

## 2015-11-18 ENCOUNTER — Other Ambulatory Visit: Payer: Self-pay | Admitting: Family Medicine

## 2015-11-18 DIAGNOSIS — R928 Other abnormal and inconclusive findings on diagnostic imaging of breast: Secondary | ICD-10-CM

## 2015-11-18 DIAGNOSIS — R921 Mammographic calcification found on diagnostic imaging of breast: Secondary | ICD-10-CM

## 2015-11-24 ENCOUNTER — Other Ambulatory Visit: Payer: Self-pay | Admitting: Family Medicine

## 2015-11-24 ENCOUNTER — Ambulatory Visit
Admission: RE | Admit: 2015-11-24 | Discharge: 2015-11-24 | Disposition: A | Discharge status: Home / Self Care | Payer: Medicare Other | Source: Ambulatory Visit | Attending: Family Medicine | Admitting: Family Medicine

## 2015-11-24 DIAGNOSIS — R928 Other abnormal and inconclusive findings on diagnostic imaging of breast: Secondary | ICD-10-CM

## 2015-11-24 DIAGNOSIS — R921 Mammographic calcification found on diagnostic imaging of breast: Secondary | ICD-10-CM

## 2015-11-24 DIAGNOSIS — N63 Unspecified lump in breast: Secondary | ICD-10-CM | POA: Diagnosis not present

## 2015-12-14 ENCOUNTER — Ambulatory Visit (INDEPENDENT_AMBULATORY_CARE_PROVIDER_SITE_OTHER): Payer: Medicare Other | Admitting: Cardiology

## 2015-12-14 VITALS — BP 134/86 | HR 76 | Ht 63.0 in | Wt 309.0 lb

## 2015-12-14 DIAGNOSIS — I5022 Chronic systolic (congestive) heart failure: Secondary | ICD-10-CM

## 2015-12-14 DIAGNOSIS — I208 Other forms of angina pectoris: Secondary | ICD-10-CM

## 2015-12-14 DIAGNOSIS — R0789 Other chest pain: Secondary | ICD-10-CM

## 2015-12-14 DIAGNOSIS — I2089 Other forms of angina pectoris: Secondary | ICD-10-CM

## 2015-12-14 DIAGNOSIS — R079 Chest pain, unspecified: Secondary | ICD-10-CM | POA: Insufficient documentation

## 2015-12-14 MED ORDER — FUROSEMIDE 20 MG PO TABS: 20.0000 mg | ORAL_TABLET | Freq: Every day | ORAL | Status: DC

## 2015-12-14 NOTE — Patient Instructions (Signed)
Medication Instructions:  Please start Furosemide 20 mg a day. Continue all other medications as listed.  Testing/Procedures: Your physician has requested that you have a lexiscan myoview. For further information please visit [email or web address]. Please follow instruction sheet, as given.  Follow-Up: Follow up in 6 months with Dr. Marlou Porch.  You will receive a letter in the mail 2 months before you are due.  Please call us when you receive this letter to schedule your follow up appointment.   If you need a refill on your cardiac medications before your next appointment, please call your pharmacy.  Thank you for choosing [facility]!!

## 2015-12-14 NOTE — Progress Notes (Signed)
Cardiology Office Note    Date:  12/14/2015   ID:  Whitney [name], DOB 06/07/1956, MRN [id number]  PCP:  Marjorie Smolder, MD  Cardiologist:   [name], MD     History of Present Illness:  Whitney [name] is a 60 y.o. female last seen in 2013 in the hospital setting after respiratory arrest which resulted in cardiac arrest. Pulses electrical elective D. Was given CPR. Chest pain from CPR. She also had shock liver. Uncontrolled diabetes. My last office note was 03/2013.  Echocardiogram on 12/08/2010 showed normal ejection fraction, mild aortic valve stenosis with sclerosis. Echocardiogram on 01/16/12 showed mild LVH, EF 40-45% with mild mitral regurgitation, mild calcification of aortic valve grade 2 diastolic dysfunction.  During the last clinic visit with her on 04/30/13 her overall clinical improvement was impressive. She was on both beta blocker and ACE inhibitor at that time. Encouraged her weight loss.  Her EF at the time of her cardiac/pulmonary arrest was 30-35%. She did not undergo cardiac catheterization since her ejection fraction improved.  Feels some CP in center of chest with activity. Baseline SOB. Knows it there. Not all the time. Sits down goes away.   Dr. Jaci Standard over 10 years ago cath OK.    Past Medical History  Diagnosis Date  . Diabetes mellitus   . Hypertension   . GERD (gastroesophageal reflux disease)   . Myocardial infarction ([place]) 2013  . Cardiac arrest ([place])   . Heart murmur   . Sleep apnea     Bring machine,mask and tubing  . Arthritis     left knee,right hip  . H/O cardiac arrest 11/2011  . Hyperlipidemia   . Glaucoma   . Depression     Past Surgical History  Procedure Laterality Date  . Back surgery    . Partial hysterectomy    . Knee surgery      left  . Hernia repair    . Foot surgery    . Cystoscopy w/ ureteral stent placement Left 01/04/2013    Procedure: CYSTOSCOPY WITH RETROGRADE PYELOGRAM/URETERAL STENT PLACEMENT;  Surgeon: Bernestine Amass, MD;  Location: [facility];  Service: Urology;  Laterality: Left;  . Cystoscopy with retrograde pyelogram, ureteroscopy and stent placement Left 01/26/2013    Procedure: CYSTOSCOPY, JJ STENT REMOVAL, LEFT URETEROSCOPY, ;  Surgeon: Bernestine Amass, MD;  Location: [facility];  Service: Urology;  Laterality: Left;  CYSTO, JJ STENT REMOVAL, LEFT URETEROSCOPY   . Holmium laser application Left [id number]    Procedure: HOLMIUM LASER LITHOTRIPSY ;  Surgeon: Bernestine Amass, MD;  Location: [facility];  Service: Urology;  Laterality: Left;    Outpatient Prescriptions Prior to Visit  Medication Sig Dispense Refill  . amLODipine (NORVASC) 5 MG tablet Take 5 mg by mouth daily.  0  . aspirin 325 MG tablet Take 650 mg by mouth every evening.     . bimatoprost (LUMIGAN) 0.03 % ophthalmic solution Place 1 drop into both eyes at bedtime.    . clobetasol ointment (TEMOVATE) [id number] % Apply 1 application topically 2 (two) times daily.  0  . glipiZIDE (GLUCOTROL XL) 10 MG 24 hr tablet Take 10 mg by mouth daily.  1  . lisinopril (PRINIVIL,ZESTRIL) 20 MG tablet Take 20 mg by mouth daily before breakfast.     . metoprolol succinate (TOPROL-XL) 100 MG 24 hr tablet Take 100 mg by mouth daily before breakfast. Take with or immediately following a meal.    . ONGLYZA 5  MG TABS tablet Take 5 mg by mouth daily.  2  . oxybutynin (DITROPAN-XL) 5 MG 24 hr tablet Take 5 mg by mouth daily.    . pantoprazole (PROTONIX) 40 MG tablet Take 40 mg by mouth daily.    . pravastatin (PRAVACHOL) 40 MG tablet Take 40 mg by mouth at bedtime.     . traMADol (ULTRAM) 50 MG tablet Take 50 mg by mouth every 6 (six) hours as needed. For pain    . aspirin 81 MG tablet Take 81 mg by mouth daily.    . ciprofloxacin (CIPRO) 500 MG tablet Take 1 tablet (500 mg total) by mouth 2 (two) times daily. One po bid x 7 days (Patient not taking: Reported on 12/14/2015) 20 tablet 0  . furosemide (LASIX) 40 MG tablet Take 40 mg by mouth daily before breakfast.    .  glipiZIDE (GLUCOTROL) 5 MG tablet Take 2.5 mg by mouth daily before breakfast. Takes 1/2 tablet    . HYDROcodone-acetaminophen (NORCO/VICODIN) 5-325 MG per tablet Take 1-2 tablets by mouth every 6 (six) hours as needed for pain. (Patient not taking: Reported on 12/14/2015) 20 tablet 0   No facility-administered medications prior to visit.     Allergies:   Contrast media   Social History   Social History  . Marital Status: Divorced    Spouse Name: N/A  . Number of Children: N/A  . Years of Education: N/A   Social History Main Topics  . Smoking status: Never Smoker   . Smokeless tobacco: Never Used  . Alcohol Use: No  . Drug Use: No  . Sexual Activity: No   Other Topics Concern  . None   Social History Narrative     Family History:  The patient's family history includes Asthma in her mother.   ROS:   Please see the history of present illness.    ROS All other systems reviewed and are negative.   PHYSICAL EXAM:   VS:  BP 134/86 mmHg  Pulse 76  Ht 5\' 3"  (1.6 m)  Wt 309 lb (140.161 kg)  BMI 54.75 kg/m2   GEN: Well nourished, well developed, in no acute distress HEENT: normal Neck: no JVD, carotid bruits, or masses Cardiac: RRR; no murmurs, rubs, or gallops,no edema  Respiratory:  clear to auscultation bilaterally, normal work of breathing GI: soft, nontender, nondistended, + BS, obese MS: no deformity or atrophy Skin: warm and dry, no rash Neuro:  Alert and Oriented x 3, Strength and sensation are intact Psych: euthymic mood, full affect  Wt Readings from Last 3 Encounters:  12/14/15 309 lb (140.161 kg)  01/04/13 313 lb 8 oz (142.203 kg)  04/22/12 311 lb 3.2 oz (141.159 kg)      Studies/Labs Reviewed:   EKG:  EKG is  ordered today. 12/14/15-sinus rhythm, 76, LVH, T-wave inversion inferior laterally consider ischemia.  Recent Labs: No results found for requested labs within last 365 days.   Lipid Panel No results found for: CHOL, TRIG, HDL, CHOLHDL, VLDL,  LDLCALC, LDLDIRECT  Additional studies/ records that were reviewed today include:  Prior office notes reviewed, lab work reviewed, echocardiogram reviewed    ASSESSMENT:    1. Chronic systolic heart failure (HCC)   2. Other chest pain   3. Morbid obesity due to excess calories ([place])   4. Angina decubitus ([place])      PLAN:  In order of problems listed above:  Chest pain/angina  -Having some exertional chest discomfort. T-wave inversions noted inferiorly  as well as laterally on EKG. LVH noted. Given these findings, we will go ahead and set her up for a 2 day pharmacologic stress test. Sensitivity will be reduced by body habitus. She had a cardiac catheterization done several years ago by Dr. Jaci Standard and was told it was normal. She has contrast media allergy listed.  Cardiomyopathy -EF originally was 30-35% in the setting of her respiratory/cardiac arrest. This improved. Continue with current medication strategy. We'll decrease her Lasix to 20 mg once a day.  Morbid obesity -Continue to encourage weight loss.  Medication Adjustments/Labs and Tests Ordered: Current medicines are reviewed at length with the patient today.  Concerns regarding medicines are outlined above.  Medication changes, Labs and Tests ordered today are listed in the Patient Instructions below. Patient Instructions  Medication Instructions:  Please start Furosemide 20 mg a day. Continue all other medications as listed.  Testing/Procedures: Your physician has requested that you have a lexiscan myoview. For further information please visit [email or web address]. Please follow instruction sheet, as given.  Follow-Up: Follow up in 6 months with Dr. Marlou Porch.  You will receive a letter in the mail 2 months before you are due.  Please call us when you receive this letter to schedule your follow up appointment.   If you need a refill on your cardiac medications before your next appointment, please call your  pharmacy.  Thank you for choosing [facility]!!            Signed, [name], MD  12/14/2015 2:23 PM    [facility] [place], [place], [place]  [address] Phone: [phone]; Fax: [phone]

## 2015-12-21 ENCOUNTER — Telehealth (HOSPITAL_COMMUNITY): Payer: Self-pay | Admitting: *Deleted

## 2015-12-21 NOTE — Telephone Encounter (Signed)
Patient given detailed instructions per Myocardial Perfusion Study Information Sheet for the test on 12/26/15. Patient notified to arrive 15 minutes early and that it is imperative to arrive on time for appointment to keep from having the test rescheduled.  If you need to cancel or reschedule your appointment, please call the office within 24 hours of your appointment. Failure to do so may result in a cancellation of your appointment, and a $50 no show fee. Patient verbalized understanding.Shaden Lacher J Adileny Delon, RN  

## 2015-12-21 NOTE — Telephone Encounter (Signed)
Patient given detailed instructions per Myocardial Perfusion Study Information Sheet for the test on 12/26/15. Patient notified to arrive 15 minutes early and that it is imperative to arrive on time for appointment to keep from having the test rescheduled.  If you need to cancel or reschedule your appointment, please call the office within 24 hours of your appointment. Failure to do so may result in a cancellation of your appointment, and a $50 no show fee. Patient verbalized understanding.[name] J Narya Beavin, RN  

## 2015-12-23 ENCOUNTER — Encounter: Payer: Self-pay | Admitting: Podiatry

## 2015-12-23 ENCOUNTER — Ambulatory Visit (INDEPENDENT_AMBULATORY_CARE_PROVIDER_SITE_OTHER): Payer: Medicare Other | Admitting: Podiatry

## 2015-12-23 DIAGNOSIS — B351 Tinea unguium: Secondary | ICD-10-CM

## 2015-12-23 DIAGNOSIS — Q828 Other specified congenital malformations of skin: Secondary | ICD-10-CM | POA: Diagnosis not present

## 2015-12-23 DIAGNOSIS — M79676 Pain in unspecified toe(s): Secondary | ICD-10-CM

## 2015-12-23 NOTE — Progress Notes (Signed)
Patient ID: Whitney [name], female   DOB: 1956-08-18, 60 y.o.   MRN: [id number] Complaint:  Visit Type: Patient returns to my office for continued preventative foot care services. Complaint: Patient states" my nails have grown long and thick and become painful to walk and wear shoes" Patient has been diagnosed with DM with no foot complications. The patient presents for preventative foot care services. No changes to ROS.  Callus left foot.  Podiatric Exam: Vascular: dorsalis pedis  pulses are palpable bilateral. Her posterior tibial pulses are not palpable due to ankle swelling. Capillary return is immediate. Temperature gradient is WNL. Skin turgor WNL  Sensorium: Normal Semmes Weinstein monofilament test. Normal tactile sensation bilaterally. Nail Exam: Pt has thick disfigured discolored nails with subungual debris noted bilateral entire nail hallux through fifth toenails Ulcer Exam: There is no evidence of ulcer or pre-ulcerative changes or infection. Orthopedic Exam: Muscle tone and strength are WNL. No limitations in general ROM. No crepitus or effusions noted. Foot type and digits show no abnormalities. Bony prominences are unremarkable. Skin: No Porokeratosis. No infection or ulcers.  Porokeratosis sub 5 th left foot.  Diagnosis:  Onychomycosis, , Pain in right toe, pain in left toes,  Debride porokeratosis  Treatment & Plan Procedures and Treatment: Consent by patient was obtained for treatment procedures. The patient understood the discussion of treatment and procedures well. All questions were answered thoroughly reviewed. Debridement of mycotic and hypertrophic toenails, 1 through 5 bilateral and clearing of subungual debris. No ulceration, no infection noted.  Return Visit-Office Procedure: Patient instructed to return to the office for a follow up visit 3 months for continued evaluation and treatment.  Gardiner Barefoot DPM

## 2015-12-26 ENCOUNTER — Ambulatory Visit (HOSPITAL_COMMUNITY): Payer: Medicare Other | Attending: Cardiology

## 2015-12-26 DIAGNOSIS — R0789 Other chest pain: Secondary | ICD-10-CM | POA: Insufficient documentation

## 2015-12-26 DIAGNOSIS — R9439 Abnormal result of other cardiovascular function study: Secondary | ICD-10-CM | POA: Diagnosis not present

## 2015-12-26 DIAGNOSIS — E119 Type 2 diabetes mellitus without complications: Secondary | ICD-10-CM | POA: Diagnosis not present

## 2015-12-26 DIAGNOSIS — I110 Hypertensive heart disease with heart failure: Secondary | ICD-10-CM | POA: Diagnosis not present

## 2015-12-26 DIAGNOSIS — I5022 Chronic systolic (congestive) heart failure: Secondary | ICD-10-CM | POA: Diagnosis not present

## 2015-12-26 DIAGNOSIS — R0609 Other forms of dyspnea: Secondary | ICD-10-CM | POA: Diagnosis not present

## 2015-12-26 MED ORDER — TECHNETIUM TC 99M SESTAMIBI GENERIC - CARDIOLITE
33.0000 | Freq: Once | INTRAVENOUS | Status: AC | PRN
  Administered 2015-12-26: 33 via INTRAVENOUS

## 2015-12-26 MED ORDER — REGADENOSON 0.4 MG/5ML IV SOLN
0.4000 mg | Freq: Once | INTRAVENOUS | Status: AC
  Administered 2015-12-26: 0.4 mg via INTRAVENOUS

## 2015-12-27 ENCOUNTER — Ambulatory Visit (HOSPITAL_COMMUNITY): Payer: Medicare Other | Attending: Cardiology

## 2015-12-27 LAB — MYOCARDIAL PERFUSION IMAGING
CHL CUP NUCLEAR SRS: 7
CHL CUP NUCLEAR SSS: 11
LV dias vol: 140 mL (ref 46–106)
LVSYSVOL: 56 mL
Peak HR: 79 {beats}/min
RATE: 0.2
Rest HR: 65 {beats}/min
SDS: 4
TID: 0.94

## 2015-12-27 MED ORDER — TECHNETIUM TC 99M SESTAMIBI GENERIC - CARDIOLITE
31.6000 | Freq: Once | INTRAVENOUS | Status: AC | PRN
  Administered 2015-12-27: 32 via INTRAVENOUS

## 2016-03-26 DIAGNOSIS — E1165 Type 2 diabetes mellitus with hyperglycemia: Secondary | ICD-10-CM | POA: Diagnosis not present

## 2016-03-26 DIAGNOSIS — G4733 Obstructive sleep apnea (adult) (pediatric): Secondary | ICD-10-CM | POA: Diagnosis not present

## 2016-03-26 DIAGNOSIS — Z7984 Long term (current) use of oral hypoglycemic drugs: Secondary | ICD-10-CM | POA: Diagnosis not present

## 2016-03-26 DIAGNOSIS — E7800 Pure hypercholesterolemia, unspecified: Secondary | ICD-10-CM | POA: Diagnosis not present

## 2016-03-26 DIAGNOSIS — K219 Gastro-esophageal reflux disease without esophagitis: Secondary | ICD-10-CM | POA: Diagnosis not present

## 2016-03-26 DIAGNOSIS — I10 Essential (primary) hypertension: Secondary | ICD-10-CM | POA: Diagnosis not present

## 2016-03-26 DIAGNOSIS — I5022 Chronic systolic (congestive) heart failure: Secondary | ICD-10-CM | POA: Diagnosis not present

## 2016-03-30 ENCOUNTER — Ambulatory Visit: Payer: Medicare Other | Admitting: Podiatry

## 2016-04-18 ENCOUNTER — Other Ambulatory Visit: Payer: Self-pay | Admitting: Family Medicine

## 2016-04-18 DIAGNOSIS — N63 Unspecified lump in unspecified breast: Secondary | ICD-10-CM

## 2016-04-18 DIAGNOSIS — R921 Mammographic calcification found on diagnostic imaging of breast: Secondary | ICD-10-CM

## 2016-05-01 ENCOUNTER — Ambulatory Visit: Payer: Medicare Other | Admitting: Podiatry

## 2016-05-22 ENCOUNTER — Ambulatory Visit (INDEPENDENT_AMBULATORY_CARE_PROVIDER_SITE_OTHER): Payer: Medicare Other | Admitting: Podiatry

## 2016-05-22 DIAGNOSIS — Q828 Other specified congenital malformations of skin: Secondary | ICD-10-CM

## 2016-05-22 DIAGNOSIS — M79676 Pain in unspecified toe(s): Secondary | ICD-10-CM

## 2016-05-22 DIAGNOSIS — B351 Tinea unguium: Secondary | ICD-10-CM

## 2016-05-22 NOTE — Progress Notes (Signed)
Patient ID: Whitney [name], female   DOB: 1956/01/05, 60 y.o.   MRN: [id number] Complaint:  Visit Type: Patient returns to my office for continued preventative foot care services. Complaint: Patient states" my nails have grown long and thick and become painful to walk and wear shoes" Patient has been diagnosed with DM with no foot complications. The patient presents for preventative foot care services. No changes to ROS.  Callus left foot.  Podiatric Exam: Vascular: dorsalis pedis  pulses are palpable bilateral. Her posterior tibial pulses are not palpable due to ankle swelling. Capillary return is immediate. Temperature gradient is WNL. Skin turgor WNL  Sensorium: Normal Semmes Weinstein monofilament test. Normal tactile sensation bilaterally. Nail Exam: Pt has thick disfigured discolored nails with subungual debris noted bilateral entire nail hallux through fifth toenails Ulcer Exam: There is no evidence of ulcer or pre-ulcerative changes or infection. Orthopedic Exam: Muscle tone and strength are WNL. No limitations in general ROM. No crepitus or effusions noted. Foot type and digits show no abnormalities. Bony prominences are unremarkable. Skin: No Porokeratosis. No infection or ulcers.  Porokeratosis sub 5 th left foot.  Diagnosis:  Onychomycosis, , Pain in right toe, pain in left toes,  Debride porokeratosis  Treatment & Plan Procedures and Treatment: Consent by patient was obtained for treatment procedures. The patient understood the discussion of treatment and procedures well. All questions were answered thoroughly reviewed. Debridement of mycotic and hypertrophic toenails, 1 through 5 bilateral and clearing of subungual debris. No ulceration, no infection noted.  Return Visit-Office Procedure: Patient instructed to return to the office for a follow up visit 3 months for continued evaluation and treatment.  Gardiner Barefoot DPM

## 2016-06-14 ENCOUNTER — Ambulatory Visit
Admission: RE | Admit: 2016-06-14 | Discharge: 2016-06-14 | Disposition: A | Discharge status: Home / Self Care | Payer: Medicare Other | Source: Ambulatory Visit | Attending: Family Medicine | Admitting: Family Medicine

## 2016-06-14 DIAGNOSIS — N63 Unspecified lump in unspecified breast: Secondary | ICD-10-CM

## 2016-06-19 ENCOUNTER — Institutional Professional Consult (permissible substitution): Payer: Medicare Other | Admitting: Pulmonary Disease

## 2016-06-21 ENCOUNTER — Encounter: Payer: Self-pay | Admitting: Pulmonary Disease

## 2016-06-21 ENCOUNTER — Ambulatory Visit (INDEPENDENT_AMBULATORY_CARE_PROVIDER_SITE_OTHER): Payer: Medicare Other | Admitting: Pulmonary Disease

## 2016-06-21 DIAGNOSIS — I208 Other forms of angina pectoris: Secondary | ICD-10-CM | POA: Diagnosis not present

## 2016-06-21 DIAGNOSIS — G4733 Obstructive sleep apnea (adult) (pediatric): Secondary | ICD-10-CM | POA: Diagnosis not present

## 2016-06-21 NOTE — Progress Notes (Signed)
Subjective:    Patient ID: Whitney [name], female    DOB: 01-Dec-1955, 60 y.o.   MRN: [id number]  HPI  Chief Complaint  Patient presents with  . Sleep Consult    Last seen in 01-14-12. Not currently using CPAP.    60 yo AAF for FU of OSA She was started on CPAP in January 14, 2012 for mild OSA after she sustained a cardiac arrest. She had good results with CPAP of 7 cm and good control of events and improvement in her daytime somnolence and fatigue. Over the past few years she stopped using her machine. She states that DME did not get her supplies and she was unable to take care of the machine and she had some depression after her mother passed away in 01/13/2013 and she just fell off track. She is now willing to resume.  Her weight is unchanged over the past few years EF seems to have recovered to 45% based on cardiology notes which were reviewed. She still has pedal edema and is compliant with Lasix-she did not take this today due to doctor's visit. Diabetes is better controlled on oral agents  Significant tests/ events  She was hospitalised in 11/2011 with dyspnea, chest pain and left leg pain . Suffered PEA cardiac arrest in CT scanner, was intubated and successfully resuscitated. Subsequently developed hypertensive emergency associated with pulmonary edema and severe hypoxia.2-D echo showed an ejection fraction of 30-35%.   11/2011 psg showed obstructive sleep apnea - AHI 10/h Started on cpap 7 cm with small quattro mask, humidity,      Past Medical History:  Diagnosis Date  . Arthritis    left knee,right hip  . Cardiac arrest ([place])   . Depression   . Diabetes mellitus   . GERD (gastroesophageal reflux disease)   . Glaucoma   . H/O cardiac arrest 11/2011  . Heart murmur   . Hyperlipidemia   . Hypertension   . Myocardial infarction ([place]) 01-14-12  . Sleep apnea    Bring machine,mask and tubing     Past Surgical History:  Procedure Laterality Date  . BACK SURGERY    . CYSTOSCOPY W/ URETERAL  STENT PLACEMENT Left 01/04/2013   Procedure: CYSTOSCOPY WITH RETROGRADE PYELOGRAM/URETERAL STENT PLACEMENT;  Surgeon: Bernestine Amass, MD;  Location: [facility];  Service: Urology;  Laterality: Left;  . CYSTOSCOPY WITH RETROGRADE PYELOGRAM, URETEROSCOPY AND STENT PLACEMENT Left 01/26/2013   Procedure: CYSTOSCOPY, JJ STENT REMOVAL, LEFT URETEROSCOPY, ;  Surgeon: Bernestine Amass, MD;  Location: [facility];  Service: Urology;  Laterality: Left;  CYSTO, JJ STENT REMOVAL, LEFT URETEROSCOPY   . FOOT SURGERY    . HERNIA REPAIR    . HOLMIUM LASER APPLICATION Left [id number]   Procedure: HOLMIUM LASER LITHOTRIPSY ;  Surgeon: Bernestine Amass, MD;  Location: [facility];  Service: Urology;  Laterality: Left;  . KNEE SURGERY     left  . PARTIAL HYSTERECTOMY      Allergies  Allergen Reactions  . Contrast Media [Iodinated Diagnostic Agents] Anaphylaxis    Social History   Social History  . Marital status: Divorced    Spouse name: N/A  . Number of children: N/A  . Years of education: N/A   Occupational History  . Not on file.   Social History Main Topics  . Smoking status: Never Smoker  . Smokeless tobacco: Never Used  . Alcohol use No  . Drug use: No  . Sexual activity: No   Other Topics Concern  .  Not on file   Social History Narrative  . No narrative on file     Family History  Problem Relation Age of Onset  . Asthma Mother     Review of Systems  Constitutional: Negative for fever and unexpected weight change.  HENT: Negative for congestion, dental problem, ear pain, nosebleeds, postnasal drip, rhinorrhea, sinus pressure, sneezing, sore throat and trouble swallowing.   Eyes: Negative for redness and itching.  Respiratory: Negative for cough, chest tightness, shortness of breath and wheezing.   Cardiovascular: Negative for palpitations and leg swelling.  Gastrointestinal: Negative for nausea and vomiting.  Genitourinary: Negative for dysuria.  Musculoskeletal: Negative for joint swelling.    Skin: Negative for rash.  Neurological: Negative for headaches.  Hematological: Does not bruise/bleed easily.  Psychiatric/Behavioral: Negative for dysphoric mood. The patient is not nervous/anxious.        Objective:   Physical Exam  Gen. Pleasant, obese, in no distress ENT - no lesions, no post nasal drip Neck: No JVD, no thyromegaly, no carotid bruits Lungs: no use of accessory muscles, no dullness to percussion, decreased without rales or rhonchi  Cardiovascular: Rhythm regular, heart sounds  normal, no murmurs or gallops, no peripheral edema Musculoskeletal: No deformities, no cyanosis or clubbing , no tremors       Assessment & Plan:

## 2016-06-21 NOTE — Assessment & Plan Note (Signed)
We discussed possibly repeating a sleep study but we agreed that since her weight has not changed, her degree of sleep disordered breathing is probably also unchanged and would be best if she just gets back on her machine   Please contact DME when you get home We will send a prescription for nasal pillows, new CPAP supplies and check download in 2-4 weeks to ensure that CPAP is working well  Weight loss encouraged, compliance with goal of at least 4-6 hrs every night is the expectation. Advised against medications with sedative side effects Cautioned against driving when sleepy - understanding that sleepiness will vary on a day to day basis

## 2016-06-21 NOTE — Patient Instructions (Signed)
Please contact DME when you get home We will send a prescription for nasal pillows, new CPAP supplies and check download in 2-4 weeks to ensure that CPAP is working well

## 2016-06-21 NOTE — Assessment & Plan Note (Signed)
Weight loss encouraged 

## 2016-06-25 ENCOUNTER — Telehealth: Payer: Self-pay | Admitting: Pulmonary Disease

## 2016-06-25 DIAGNOSIS — G4733 Obstructive sleep apnea (adult) (pediatric): Secondary | ICD-10-CM

## 2016-06-25 NOTE — Telephone Encounter (Signed)
Last ov on 06/21/16 with [facility] Instructions      Return in about 3 months (around 09/20/2016) for TP.   Please contact DME when you get home We will send a prescription for nasal pillows, new CPAP supplies and check download in 2-4 weeks to ensure that CPAP is working well   Called spoke with pt. She states she called [facility] because she had not received any call about the CPAP order that was suppose to be placed on 06/21/16. I apologized and explained to her that the order was not placed at the ov. I also informed her that we have her DME as [facility]. She voiced understanding and had no further questions. Order has been placed. Nothing further needed.

## 2016-07-18 DIAGNOSIS — I10 Essential (primary) hypertension: Secondary | ICD-10-CM | POA: Diagnosis not present

## 2016-07-18 DIAGNOSIS — I5022 Chronic systolic (congestive) heart failure: Secondary | ICD-10-CM | POA: Diagnosis not present

## 2016-07-18 DIAGNOSIS — Z7984 Long term (current) use of oral hypoglycemic drugs: Secondary | ICD-10-CM | POA: Diagnosis not present

## 2016-07-18 DIAGNOSIS — M170 Bilateral primary osteoarthritis of knee: Secondary | ICD-10-CM | POA: Diagnosis not present

## 2016-07-18 DIAGNOSIS — Z1211 Encounter for screening for malignant neoplasm of colon: Secondary | ICD-10-CM | POA: Diagnosis not present

## 2016-07-18 DIAGNOSIS — E7800 Pure hypercholesterolemia, unspecified: Secondary | ICD-10-CM | POA: Diagnosis not present

## 2016-07-18 DIAGNOSIS — Z23 Encounter for immunization: Secondary | ICD-10-CM | POA: Diagnosis not present

## 2016-07-18 DIAGNOSIS — E1165 Type 2 diabetes mellitus with hyperglycemia: Secondary | ICD-10-CM | POA: Diagnosis not present

## 2016-08-08 ENCOUNTER — Other Ambulatory Visit: Payer: Self-pay | Admitting: Gastroenterology

## 2016-08-20 DIAGNOSIS — H2513 Age-related nuclear cataract, bilateral: Secondary | ICD-10-CM | POA: Diagnosis not present

## 2016-08-20 DIAGNOSIS — E119 Type 2 diabetes mellitus without complications: Secondary | ICD-10-CM | POA: Diagnosis not present

## 2016-08-20 DIAGNOSIS — H401131 Primary open-angle glaucoma, bilateral, mild stage: Secondary | ICD-10-CM | POA: Diagnosis not present

## 2016-08-20 DIAGNOSIS — H04123 Dry eye syndrome of bilateral lacrimal glands: Secondary | ICD-10-CM | POA: Diagnosis not present

## 2016-08-20 DIAGNOSIS — H35033 Hypertensive retinopathy, bilateral: Secondary | ICD-10-CM | POA: Diagnosis not present

## 2016-08-28 ENCOUNTER — Ambulatory Visit: Payer: Medicare Other | Admitting: Podiatry

## 2016-09-11 ENCOUNTER — Other Ambulatory Visit: Payer: Self-pay | Admitting: Gastroenterology

## 2016-09-12 ENCOUNTER — Ambulatory Visit (HOSPITAL_COMMUNITY)
Admission: RE | Admit: 2016-09-12 | Discharge: 2016-09-12 | Disposition: A | Discharge status: Home / Self Care | Payer: Medicare Other | Source: Ambulatory Visit | Attending: Gastroenterology | Admitting: Gastroenterology

## 2016-09-12 ENCOUNTER — Encounter (HOSPITAL_COMMUNITY)
Admission: RE | Disposition: A | Discharge status: Home / Self Care | Payer: Self-pay | Source: Ambulatory Visit | Attending: Gastroenterology

## 2016-09-12 ENCOUNTER — Ambulatory Visit (HOSPITAL_COMMUNITY): Payer: Medicare Other | Admitting: Registered Nurse

## 2016-09-12 ENCOUNTER — Encounter (HOSPITAL_COMMUNITY): Payer: Self-pay

## 2016-09-12 DIAGNOSIS — G4730 Sleep apnea, unspecified: Secondary | ICD-10-CM | POA: Diagnosis not present

## 2016-09-12 DIAGNOSIS — Z87442 Personal history of urinary calculi: Secondary | ICD-10-CM | POA: Diagnosis not present

## 2016-09-12 DIAGNOSIS — Z79899 Other long term (current) drug therapy: Secondary | ICD-10-CM | POA: Diagnosis not present

## 2016-09-12 DIAGNOSIS — Z7982 Long term (current) use of aspirin: Secondary | ICD-10-CM | POA: Diagnosis not present

## 2016-09-12 DIAGNOSIS — I252 Old myocardial infarction: Secondary | ICD-10-CM | POA: Insufficient documentation

## 2016-09-12 DIAGNOSIS — K219 Gastro-esophageal reflux disease without esophagitis: Secondary | ICD-10-CM | POA: Insufficient documentation

## 2016-09-12 DIAGNOSIS — N3281 Overactive bladder: Secondary | ICD-10-CM | POA: Insufficient documentation

## 2016-09-12 DIAGNOSIS — M1990 Unspecified osteoarthritis, unspecified site: Secondary | ICD-10-CM | POA: Diagnosis not present

## 2016-09-12 DIAGNOSIS — I340 Nonrheumatic mitral (valve) insufficiency: Secondary | ICD-10-CM | POA: Insufficient documentation

## 2016-09-12 DIAGNOSIS — Z8249 Family history of ischemic heart disease and other diseases of the circulatory system: Secondary | ICD-10-CM | POA: Diagnosis not present

## 2016-09-12 DIAGNOSIS — H409 Unspecified glaucoma: Secondary | ICD-10-CM | POA: Insufficient documentation

## 2016-09-12 DIAGNOSIS — Z7984 Long term (current) use of oral hypoglycemic drugs: Secondary | ICD-10-CM | POA: Diagnosis not present

## 2016-09-12 DIAGNOSIS — I5022 Chronic systolic (congestive) heart failure: Secondary | ICD-10-CM | POA: Insufficient documentation

## 2016-09-12 DIAGNOSIS — Z823 Family history of stroke: Secondary | ICD-10-CM | POA: Diagnosis not present

## 2016-09-12 DIAGNOSIS — K5909 Other constipation: Secondary | ICD-10-CM | POA: Insufficient documentation

## 2016-09-12 DIAGNOSIS — Z1211 Encounter for screening for malignant neoplasm of colon: Secondary | ICD-10-CM | POA: Diagnosis not present

## 2016-09-12 DIAGNOSIS — Z833 Family history of diabetes mellitus: Secondary | ICD-10-CM | POA: Insufficient documentation

## 2016-09-12 DIAGNOSIS — I110 Hypertensive heart disease with heart failure: Secondary | ICD-10-CM | POA: Insufficient documentation

## 2016-09-12 DIAGNOSIS — Z888 Allergy status to other drugs, medicaments and biological substances status: Secondary | ICD-10-CM | POA: Diagnosis not present

## 2016-09-12 DIAGNOSIS — E119 Type 2 diabetes mellitus without complications: Secondary | ICD-10-CM | POA: Insufficient documentation

## 2016-09-12 DIAGNOSIS — K635 Polyp of colon: Secondary | ICD-10-CM | POA: Diagnosis not present

## 2016-09-12 DIAGNOSIS — D125 Benign neoplasm of sigmoid colon: Secondary | ICD-10-CM | POA: Insufficient documentation

## 2016-09-12 DIAGNOSIS — I509 Heart failure, unspecified: Secondary | ICD-10-CM | POA: Diagnosis not present

## 2016-09-12 HISTORY — PX: COLONOSCOPY: SHX5424

## 2016-09-12 LAB — GLUCOSE, CAPILLARY: Glucose-Capillary: 210 mg/dL — ABNORMAL HIGH (ref 65–99)

## 2016-09-12 SURGERY — COLONOSCOPY
Anesthesia: Monitor Anesthesia Care

## 2016-09-12 MED ORDER — LIDOCAINE 2% (20 MG/ML) 5 ML SYRINGE
INTRAMUSCULAR | Status: DC | PRN
  Administered 2016-09-12: 100 mg via INTRAVENOUS

## 2016-09-12 MED ORDER — PROPOFOL 10 MG/ML IV BOLUS
INTRAVENOUS | Status: AC
  Filled 2016-09-12: qty 40

## 2016-09-12 MED ORDER — PROPOFOL 500 MG/50ML IV EMUL
INTRAVENOUS | Status: DC | PRN
Start: 2016-09-12 — End: 2016-09-12
  Administered 2016-09-12: 130 ug/kg/min via INTRAVENOUS

## 2016-09-12 MED ORDER — SODIUM CHLORIDE 0.9 % IV SOLN
INTRAVENOUS | Status: DC
  Administered 2016-09-12: 10:00:00 via INTRAVENOUS

## 2016-09-12 MED ORDER — SODIUM CHLORIDE 0.9 % IV SOLN
INTRAVENOUS | Status: DC
Start: 2016-09-12 — End: 2016-09-12

## 2016-09-12 MED ORDER — LACTATED RINGERS IV SOLN
INTRAVENOUS | Status: DC
  Administered 2016-09-12: 1000 mL via INTRAVENOUS

## 2016-09-12 MED ORDER — LIDOCAINE 2% (20 MG/ML) 5 ML SYRINGE
INTRAMUSCULAR | Status: AC
  Filled 2016-09-12: qty 5

## 2016-09-12 MED ORDER — PROPOFOL 10 MG/ML IV BOLUS
INTRAVENOUS | Status: AC
  Filled 2016-09-12: qty 20

## 2016-09-12 NOTE — Anesthesia Postprocedure Evaluation (Signed)
Anesthesia Post Note  Patient: MIAROSE POELLNITZ  Procedure(s) Performed: Procedure(s) (LRB): COLONOSCOPY (N/A)  Patient location during evaluation: Endoscopy Anesthesia Type: MAC Level of consciousness: awake and alert Pain management: pain level controlled Vital Signs Assessment: post-procedure vital signs reviewed and stable Respiratory status: spontaneous breathing, nonlabored ventilation, respiratory function stable and patient connected to nasal cannula oxygen Cardiovascular status: stable and blood pressure returned to baseline Anesthetic complications: no    Last Vitals:  Vitals:   09/12/16 1040 09/12/16 1045  BP: (!) 160/92 (!) 176/89  Pulse: 83 73  Resp: 18 20  Temp:      Last Pain:  Vitals:   09/12/16 1045  TempSrc:   PainSc: 5                  Caelin Rayl,JAMES TERRILL

## 2016-09-12 NOTE — Progress Notes (Signed)
Whitney [name] 9:32 AM  Subjective: Patient with chronic constipation but no other medical issues since she was seen recently in our office and we reviewed her previous colonoscopy and her family history is negative for any GI problems  Objective: Vital signs stable afebrile no acute distress exam please see preassessment evaluation  Assessment: Due for colonic screening plan: Okay to proceed with colonoscopy with anesthesia assistance  [facility]  Pager [phone] After 5PM or if no answer call [phone]

## 2016-09-12 NOTE — Discharge Instructions (Signed)
Esophagogastroduodenoscopy, Care After Introduction Refer to this sheet in the next few weeks. These instructions provide you with information about caring for yourself after your procedure. Your health care provider may also give you more specific instructions. Your treatment has been planned according to current medical practices, but problems sometimes occur. Call your health care provider if you have any problems or questions after your procedure. What can I expect after the procedure? After the procedure, it is common to have:  A sore throat.  Nausea.  Bloating.  Dizziness.  Fatigue. Follow these instructions at home:  Do not eat or drink anything until the numbing medicine (local anesthetic) has worn off and your gag reflex has returned. You will know that the local anesthetic has worn off when you can swallow comfortably.  Do not drive for 24 hours if you received a medicine to help you relax (sedative).  If your health care provider took a tissue sample for testing during the procedure, make sure to get your test results. This is your responsibility. Ask your health care provider or the department performing the test when your results will be ready.  Keep all follow-up visits as told by your health care provider. This is important. Contact a health care provider if:  You cannot stop coughing.  You are not urinating.  You are urinating less than usual. Get help right away if:  You have trouble swallowing.  You cannot eat or drink.  You have throat or chest pain that gets worse.  You are dizzy or light-headed.  You faint.  You have nausea or vomiting.  You have chills.  You have a fever.  You have severe abdominal pain.  You have black, tarry, or bloody stools. This information is not intended to replace advice given to you by your health care provider. Make sure you discuss any questions you have with your health care provider. Document Released: 09/03/2012  Document Revised: 02/23/2016 Document Reviewed: 08/11/2015  2017 Elsevier Call if question or problem otherwise soft solids first meal and slowly advance diet as tolerated and call in 1 week for biopsy report and follow-up as needed if GI problems come up

## 2016-09-12 NOTE — Op Note (Signed)
[facility] Patient Name: Whitney [name] Procedure Date: 09/12/2016 MRN: [id number] Attending MD: Clarene Essex , MD Date of Birth: Jan 28, 1956 CSN: [id number] Age: 60 Admit Type: Outpatient Procedure:                Colonoscopy Indications:              Screening for colorectal malignant neoplasm, Last                            colonoscopy: July 2007 Providers:                Clarene Essex, MD, Cleda Daub, RN, Cherylynn Ridges,                            Technician Referring MD:              Medicines:                Propofol total dose 500 mg IV,100 mg lidocaine IV Complications:            No immediate complications. Estimated Blood Loss:     Estimated blood loss: none. Procedure:                Pre-Anesthesia Assessment:                           - Prior to the procedure, a History and Physical                            was performed, and patient medications and                            allergies were reviewed. The patient's tolerance of                            previous anesthesia was also reviewed. The risks                            and benefits of the procedure and the sedation                            options and risks were discussed with the patient.                            All questions were answered, and informed consent                            was obtained. Prior Anticoagulants: The patient has                            taken aspirin, last dose was 3 days prior to                            procedure. ASA Grade Assessment: III - A patient  with severe systemic disease. After reviewing the                            risks and benefits, the patient was deemed in                            satisfactory condition to undergo the procedure.                           After obtaining informed consent, the colonoscope                            was passed under direct vision. Throughout the                            procedure,  the patient's blood pressure, pulse, and                            oxygen saturations were monitored continuously. The                            [id number] scope was introduced through                            the anus and advanced to the the cecum, identified                            by appendiceal orifice and ileocecal valve. The                            colonoscopy was performed without difficulty. The                            patient tolerated the procedure well. The quality                            of the bowel preparation was adequate. The                            ileocecal valve, appendiceal orifice, and rectum                            were photographed. Scope In: 10:09:52 AM Scope Out: 10:20:26 AM Scope Withdrawal Time: 0 hours 8 minutes 23 seconds  Total Procedure Duration: 0 hours 10 minutes 34 seconds  Findings:      A diminutive polyp was found in the proximal sigmoid colon. The polyp       was semi-sessile. Biopsies were taken with a cold forceps for histology.      The exam was otherwise without abnormality. Impression:               - One diminutive polyp in the proximal sigmoid                            colon.  Biopsied.                           - The examination was otherwise normal. Moderate Sedation:      N/A- Per Anesthesia Care Recommendation:           - Patient has a contact number available for                            emergencies. The signs and symptoms of potential                            delayed complications were discussed with the                            patient. Return to normal activities tomorrow.                            Written discharge instructions were provided to the                            patient.                           - Soft diet today.                           - Continue present medications.                           - Await pathology results.                           - Repeat colonoscopy in 5-10  years for surveillance                            based on pathology results if doing well medically.                           - Return to GI office PRN.                           - Telephone GI clinic for pathology results in 1                            week.                           - Telephone GI clinic if symptomatic PRN. Procedure Code(s):        --- Professional ---                           [phone], Colonoscopy, flexible; with biopsy, single                            or multiple Diagnosis Code(s):        --- Professional ---  Z12.11, Encounter for screening for malignant                            neoplasm of colon                           D12.5, Benign neoplasm of sigmoid colon CPT copyright 2016 American Medical Association. All rights reserved. The codes documented in this report are preliminary and upon coder review may  be revised to meet current compliance requirements. Clarene Essex, MD 09/12/2016 10:33:03 AM This report has been signed electronically. Number of Addenda: 0

## 2016-09-12 NOTE — Anesthesia Preprocedure Evaluation (Addendum)
Anesthesia Evaluation  Patient identified by MRN, date of birth, ID band Patient awake    Reviewed: Allergy & Precautions, NPO status , Patient's Chart, lab work & pertinent test results  Airway Mallampati: II  TM Distance: >3 FB Neck ROM: Full    Dental   Pulmonary sleep apnea ,    breath sounds clear to auscultation       Cardiovascular hypertension, + Past MI, +CHF, + PND and + DOE  + Valvular Problems/Murmurs  Rhythm:Regular Rate:Normal + Systolic murmurs Depressed LV function in echo years ago   Neuro/Psych    GI/Hepatic GERD  ,  Endo/Other  diabetesMorbid obesity  Renal/GU      Musculoskeletal  (+) Arthritis ,   Abdominal (+) + obese, Very obese  Peds  Hematology   Anesthesia Other Findings   Reproductive/Obstetrics                             Anesthesia Physical Anesthesia Plan  ASA: IV  Anesthesia Plan: MAC   Post-op Pain Management:    Induction: Intravenous  Airway Management Planned: Simple Face Mask  Additional Equipment:   Intra-op Plan:   Post-operative Plan:   Informed Consent: I have reviewed the patients History and Physical, chart, labs and discussed the procedure including the risks, benefits and alternatives for the proposed anesthesia with the patient or authorized representative who has indicated his/her understanding and acceptance.   Dental advisory given  Plan Discussed with: CRNA  Anesthesia Plan Comments:         Anesthesia Quick Evaluation

## 2016-09-12 NOTE — Transfer of Care (Signed)
Immediate Anesthesia Transfer of Care Note  Patient: Whitney [name]  Procedure(s) Performed: Procedure(s): COLONOSCOPY (N/A)  Patient Location: PACU and Endoscopy Unit  Anesthesia Type:MAC  Level of Consciousness: awake, alert , oriented and patient cooperative  Airway & Oxygen Therapy: Patient Spontanous Breathing and Patient connected to face mask oxygen  Post-op Assessment: Report given to RN, Post -op Vital signs reviewed and stable and Patient moving all extremities  Post vital signs: Reviewed and stable  Last Vitals:  Vitals:   09/12/16 0900  BP: (!) 164/58  Resp: 17  Temp: 36.7 C    Last Pain:  Vitals:   09/12/16 0900  TempSrc: Oral         Complications: No apparent anesthesia complications

## 2016-09-13 ENCOUNTER — Encounter (HOSPITAL_COMMUNITY): Payer: Self-pay | Admitting: Gastroenterology

## 2016-09-27 ENCOUNTER — Ambulatory Visit: Payer: Medicare Other | Admitting: Pulmonary Disease

## 2016-10-02 ENCOUNTER — Encounter: Payer: Self-pay | Admitting: Podiatry

## 2016-10-02 ENCOUNTER — Ambulatory Visit (INDEPENDENT_AMBULATORY_CARE_PROVIDER_SITE_OTHER): Payer: Medicare Other | Admitting: Podiatry

## 2016-10-02 VITALS — Ht 63.0 in | Wt 314.0 lb

## 2016-10-02 DIAGNOSIS — B351 Tinea unguium: Secondary | ICD-10-CM

## 2016-10-02 DIAGNOSIS — E119 Type 2 diabetes mellitus without complications: Secondary | ICD-10-CM

## 2016-10-02 DIAGNOSIS — M79676 Pain in unspecified toe(s): Secondary | ICD-10-CM

## 2016-10-02 DIAGNOSIS — Q828 Other specified congenital malformations of skin: Secondary | ICD-10-CM | POA: Diagnosis not present

## 2016-10-02 NOTE — Progress Notes (Signed)
Patient ID: Whitney [name], female   DOB: 04/09/1956, 61 y.o.   MRN: [id number] Complaint:  Visit Type: Patient returns to my office for continued preventative foot care services. Complaint: Patient states" my nails have grown long and thick and become painful to walk and wear shoes" Patient has been diagnosed with DM with no foot complications. The patient presents for preventative foot care services. No changes to ROS.  Callus left foot.  Podiatric Exam: Vascular: dorsalis pedis  pulses are palpable bilateral. Her posterior tibial pulses are not palpable due to ankle swelling. Capillary return is immediate. Temperature gradient is WNL. Skin turgor WNL  Sensorium: Normal Semmes Weinstein monofilament test. Normal tactile sensation bilaterally. Nail Exam: Pt has thick disfigured discolored nails with subungual debris noted bilateral entire nail hallux through fifth toenails Ulcer Exam: There is no evidence of ulcer or pre-ulcerative changes or infection. Orthopedic Exam: Muscle tone and strength are WNL. No limitations in general ROM. No crepitus or effusions noted. Foot type and digits show no abnormalities. Bony prominences are unremarkable. Skin: No Porokeratosis. No infection or ulcers.  Porokeratosis sub 5 th left foot.  Diagnosis:  Onychomycosis, , Pain in right toe, pain in left toes,  Debride porokeratosis  Treatment & Plan Procedures and Treatment: Consent by patient was obtained for treatment procedures. The patient understood the discussion of treatment and procedures well. All questions were answered thoroughly reviewed. Debridement of mycotic and hypertrophic toenails, 1 through 5 bilateral and clearing of subungual debris. No ulceration, no infection noted.  Debridement of porokeratosis. Return Visit-Office Procedure: Patient instructed to return to the office for a follow up visit 3 months for continued evaluation and treatment.  Gardiner Barefoot DPM

## 2016-10-11 ENCOUNTER — Ambulatory Visit: Payer: Medicare Other | Admitting: Pulmonary Disease

## 2016-10-18 ENCOUNTER — Ambulatory Visit: Payer: Medicare Other | Admitting: Pulmonary Disease

## 2016-11-22 DIAGNOSIS — E7800 Pure hypercholesterolemia, unspecified: Secondary | ICD-10-CM | POA: Diagnosis not present

## 2016-11-22 DIAGNOSIS — I10 Essential (primary) hypertension: Secondary | ICD-10-CM | POA: Diagnosis not present

## 2016-11-22 DIAGNOSIS — E1165 Type 2 diabetes mellitus with hyperglycemia: Secondary | ICD-10-CM | POA: Diagnosis not present

## 2016-11-22 DIAGNOSIS — I5022 Chronic systolic (congestive) heart failure: Secondary | ICD-10-CM | POA: Diagnosis not present

## 2016-11-22 DIAGNOSIS — M170 Bilateral primary osteoarthritis of knee: Secondary | ICD-10-CM | POA: Diagnosis not present

## 2016-11-22 DIAGNOSIS — Z7984 Long term (current) use of oral hypoglycemic drugs: Secondary | ICD-10-CM | POA: Diagnosis not present

## 2016-11-23 ENCOUNTER — Ambulatory Visit (INDEPENDENT_AMBULATORY_CARE_PROVIDER_SITE_OTHER): Payer: Medicare Other | Admitting: Pulmonary Disease

## 2016-11-23 ENCOUNTER — Encounter: Payer: Self-pay | Admitting: Pulmonary Disease

## 2016-11-23 DIAGNOSIS — G4733 Obstructive sleep apnea (adult) (pediatric): Secondary | ICD-10-CM | POA: Diagnosis not present

## 2016-11-23 DIAGNOSIS — I5022 Chronic systolic (congestive) heart failure: Secondary | ICD-10-CM

## 2016-11-23 NOTE — Patient Instructions (Signed)
We will request an adult and pediatric specialists to provide [place] a download report on your machine

## 2016-11-23 NOTE — Progress Notes (Signed)
   Subjective:    Patient ID: Whitney [name], female    DOB: 11-13-1955, 61 y.o.   MRN: [id number]  HPI  61 yo AAF With chronic systolic heart failure for FU of OSA She was started on CPAP in 2013 for mild OSA after she sustained a cardiac arrest. Unfortunately she stopped using this and restarted in 2017  She now uses nasal pillows and had good results with this. She reports good improvement in her daytime somnolence and fatigue. She denies any problems with mask or pressure . she still takes occasional naps in the daytime has dryness of mouth on  waking up    She has been unable to lose weight. Reports chronic bipedal edema, occasional emesis dose of Lasix  Significant tests/ events   11/2011  Suffered PEA cardiac arrest in CT scanner at hosp  11/2011 psg showed obstructive sleep apnea - AHI 10/h Started on cpap 7 cm with small quattro mask, humidity,  Review of Systems neg for any significant sore throat, dysphagia, itching, sneezing, nasal congestion or excess/ purulent secretions, fever, chills, sweats, unintended wt loss, pleuritic or exertional cp, hempoptysis, orthopnea pnd or change in chronic leg swelling. Also denies presyncope, palpitations, heartburn, abdominal pain, nausea, vomiting, diarrhea or change in bowel or urinary habits, dysuria,hematuria, rash, arthralgias, visual complaints, headache, numbness weakness or ataxia.     Objective:   Physical Exam  Gen. Pleasant, obese, in no distress ENT - no lesions, no post nasal drip Neck: No JVD, no thyromegaly, no carotid bruits Lungs: no use of accessory muscles, no dullness to percussion, decreased without rales or rhonchi  Cardiovascular: Rhythm regular, heart sounds  normal, no murmurs or gallops, no peripheral edema Musculoskeletal: No deformities, no cyanosis or clubbing , no tremors       Assessment & Plan:

## 2016-11-23 NOTE — Assessment & Plan Note (Signed)
Ct lasix Weight monitoring

## 2016-11-23 NOTE — Assessment & Plan Note (Signed)
We will request an adult and pediatric specialists to provide [place] a download report on your machine  Weight loss encouraged, compliance with goal of at least 4-6 hrs every night is the expectation. Advised against medications with sedative side effects Cautioned against driving when sleepy - understanding that sleepiness will vary on a day to day basis

## 2017-01-08 ENCOUNTER — Emergency Department (HOSPITAL_COMMUNITY)
Admission: EM | Admit: 2017-01-08 | Discharge: 2017-01-08 | Disposition: A | Discharge status: Home / Self Care | Payer: Medicare Other | Attending: Emergency Medicine | Admitting: Emergency Medicine

## 2017-01-08 ENCOUNTER — Ambulatory Visit: Payer: Medicare Other | Admitting: Podiatry

## 2017-01-08 ENCOUNTER — Encounter (HOSPITAL_COMMUNITY): Payer: Self-pay

## 2017-01-08 DIAGNOSIS — I252 Old myocardial infarction: Secondary | ICD-10-CM | POA: Insufficient documentation

## 2017-01-08 DIAGNOSIS — E119 Type 2 diabetes mellitus without complications: Secondary | ICD-10-CM | POA: Diagnosis not present

## 2017-01-08 DIAGNOSIS — I110 Hypertensive heart disease with heart failure: Secondary | ICD-10-CM | POA: Insufficient documentation

## 2017-01-08 DIAGNOSIS — Y999 Unspecified external cause status: Secondary | ICD-10-CM | POA: Diagnosis not present

## 2017-01-08 DIAGNOSIS — S39012A Strain of muscle, fascia and tendon of lower back, initial encounter: Secondary | ICD-10-CM | POA: Insufficient documentation

## 2017-01-08 DIAGNOSIS — I5022 Chronic systolic (congestive) heart failure: Secondary | ICD-10-CM | POA: Insufficient documentation

## 2017-01-08 DIAGNOSIS — Z7982 Long term (current) use of aspirin: Secondary | ICD-10-CM | POA: Diagnosis not present

## 2017-01-08 DIAGNOSIS — Y92410 Unspecified street and highway as the place of occurrence of the external cause: Secondary | ICD-10-CM | POA: Diagnosis not present

## 2017-01-08 DIAGNOSIS — Y939 Activity, unspecified: Secondary | ICD-10-CM | POA: Diagnosis not present

## 2017-01-08 DIAGNOSIS — S3992XA Unspecified injury of lower back, initial encounter: Secondary | ICD-10-CM | POA: Diagnosis present

## 2017-01-08 DIAGNOSIS — Z79899 Other long term (current) drug therapy: Secondary | ICD-10-CM | POA: Insufficient documentation

## 2017-01-08 MED ORDER — CYCLOBENZAPRINE HCL 10 MG PO TABS: 5.0000 mg | ORAL_TABLET | Freq: Two times a day (BID) | ORAL | 0 refills | Status: DC | PRN
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
## 2017-02-19 DIAGNOSIS — H52223 Regular astigmatism, bilateral: Secondary | ICD-10-CM | POA: Diagnosis not present

## 2017-02-19 DIAGNOSIS — H401131 Primary open-angle glaucoma, bilateral, mild stage: Secondary | ICD-10-CM | POA: Diagnosis not present

## 2017-02-19 DIAGNOSIS — H5203 Hypermetropia, bilateral: Secondary | ICD-10-CM | POA: Diagnosis not present

## 2017-02-19 DIAGNOSIS — H524 Presbyopia: Secondary | ICD-10-CM | POA: Diagnosis not present

## 2017-02-19 DIAGNOSIS — H04123 Dry eye syndrome of bilateral lacrimal glands: Secondary | ICD-10-CM | POA: Diagnosis not present

## 2017-02-27 DIAGNOSIS — E1165 Type 2 diabetes mellitus with hyperglycemia: Secondary | ICD-10-CM | POA: Diagnosis not present

## 2017-02-27 DIAGNOSIS — I5022 Chronic systolic (congestive) heart failure: Secondary | ICD-10-CM | POA: Diagnosis not present

## 2017-02-27 DIAGNOSIS — M170 Bilateral primary osteoarthritis of knee: Secondary | ICD-10-CM | POA: Diagnosis not present

## 2017-02-27 DIAGNOSIS — I10 Essential (primary) hypertension: Secondary | ICD-10-CM | POA: Diagnosis not present

## 2017-03-14 ENCOUNTER — Ambulatory Visit: Payer: Medicare Other | Admitting: Cardiology

## 2017-05-02 ENCOUNTER — Encounter: Payer: Self-pay | Admitting: Podiatry

## 2017-05-02 ENCOUNTER — Ambulatory Visit (INDEPENDENT_AMBULATORY_CARE_PROVIDER_SITE_OTHER): Payer: Medicare Other | Admitting: Podiatry

## 2017-05-02 DIAGNOSIS — B351 Tinea unguium: Secondary | ICD-10-CM

## 2017-05-02 DIAGNOSIS — Q828 Other specified congenital malformations of skin: Secondary | ICD-10-CM | POA: Diagnosis not present

## 2017-05-02 DIAGNOSIS — E119 Type 2 diabetes mellitus without complications: Secondary | ICD-10-CM | POA: Diagnosis not present

## 2017-05-02 DIAGNOSIS — M79676 Pain in unspecified toe(s): Secondary | ICD-10-CM | POA: Diagnosis not present

## 2017-05-02 NOTE — Progress Notes (Signed)
Patient ID: Whitney [name], female   DOB: Aug 06, 1956, 61 y.o.   MRN: [id number] Complaint:  Visit Type: Patient returns to my office for continued preventative foot care services. Complaint: Patient states" my nails have grown long and thick and become painful to walk and wear shoes" Patient has been diagnosed with DM with no foot complications. The patient presents for preventative foot care services. No changes to ROS.  Callus left foot.  Podiatric Exam: Vascular: dorsalis pedis  pulses are palpable bilateral. Her posterior tibial pulses are not palpable due to ankle swelling. Capillary return is immediate. Temperature gradient is WNL. Skin turgor WNL  Sensorium: Normal Semmes Weinstein monofilament test. Normal tactile sensation bilaterally. Nail Exam: Pt has thick disfigured discolored nails with subungual debris noted bilateral entire nail hallux through fifth toenails Ulcer Exam: There is no evidence of ulcer or pre-ulcerative changes or infection. Orthopedic Exam: Muscle tone and strength are WNL. No limitations in general ROM. No crepitus or effusions noted. Foot type and digits show no abnormalities. Bony prominences are unremarkable. Skin: No Porokeratosis. No infection or ulcers.  Porokeratosis sub 5 th left foot. Heloma durum fifth toe right foot.  Diagnosis:  Onychomycosis, , Pain in right toe, pain in left toes,  Debride porokeratosis  Treatment & Plan Procedures and Treatment: Consent by patient was obtained for treatment procedures. The patient understood the discussion of treatment and procedures well. All questions were answered thoroughly reviewed. Debridement of mycotic and hypertrophic toenails, 1 through 5 bilateral and clearing of subungual debris. No ulceration, no infection noted.  Debridement of porokeratosis./corn Return Visit-Office Procedure: Patient instructed to return to the office for a follow up visit 3 months for continued evaluation and treatment.  Gardiner Barefoot  DPM

## 2017-05-22 DIAGNOSIS — M170 Bilateral primary osteoarthritis of knee: Secondary | ICD-10-CM | POA: Diagnosis not present

## 2017-05-22 DIAGNOSIS — E1165 Type 2 diabetes mellitus with hyperglycemia: Secondary | ICD-10-CM | POA: Diagnosis not present

## 2017-05-22 DIAGNOSIS — I5022 Chronic systolic (congestive) heart failure: Secondary | ICD-10-CM | POA: Diagnosis not present

## 2017-05-22 DIAGNOSIS — I10 Essential (primary) hypertension: Secondary | ICD-10-CM | POA: Diagnosis not present

## 2017-05-22 DIAGNOSIS — R209 Unspecified disturbances of skin sensation: Secondary | ICD-10-CM | POA: Diagnosis not present

## 2017-05-22 DIAGNOSIS — R350 Frequency of micturition: Secondary | ICD-10-CM | POA: Diagnosis not present

## 2017-06-04 ENCOUNTER — Ambulatory Visit: Payer: Medicare Other | Admitting: Cardiology

## 2017-06-05 ENCOUNTER — Ambulatory Visit: Payer: Medicare Other | Admitting: Cardiology

## 2017-07-12 ENCOUNTER — Inpatient Hospital Stay (HOSPITAL_COMMUNITY): Admit: 2017-07-12 | Payer: Medicare Other

## 2017-07-12 ENCOUNTER — Emergency Department (HOSPITAL_COMMUNITY): Payer: Medicare Other

## 2017-07-12 ENCOUNTER — Encounter (HOSPITAL_COMMUNITY): Payer: Self-pay | Admitting: *Deleted

## 2017-07-12 ENCOUNTER — Inpatient Hospital Stay (HOSPITAL_COMMUNITY)
Admission: EM | Admit: 2017-07-12 | Discharge: 2017-07-15 | DRG: 563 | Disposition: A | Discharge status: Skilled Nursing Facility | Payer: Medicare Other | Attending: Internal Medicine | Admitting: Internal Medicine

## 2017-07-12 DIAGNOSIS — S8992XA Unspecified injury of left lower leg, initial encounter: Secondary | ICD-10-CM | POA: Diagnosis not present

## 2017-07-12 DIAGNOSIS — Z9181 History of falling: Secondary | ICD-10-CM | POA: Diagnosis not present

## 2017-07-12 DIAGNOSIS — I255 Ischemic cardiomyopathy: Secondary | ICD-10-CM | POA: Diagnosis present

## 2017-07-12 DIAGNOSIS — I5022 Chronic systolic (congestive) heart failure: Secondary | ICD-10-CM | POA: Diagnosis present

## 2017-07-12 DIAGNOSIS — I10 Essential (primary) hypertension: Secondary | ICD-10-CM | POA: Diagnosis not present

## 2017-07-12 DIAGNOSIS — S42301A Unspecified fracture of shaft of humerus, right arm, initial encounter for closed fracture: Secondary | ICD-10-CM | POA: Diagnosis not present

## 2017-07-12 DIAGNOSIS — I110 Hypertensive heart disease with heart failure: Secondary | ICD-10-CM | POA: Diagnosis present

## 2017-07-12 DIAGNOSIS — Z91041 Radiographic dye allergy status: Secondary | ICD-10-CM | POA: Diagnosis not present

## 2017-07-12 DIAGNOSIS — W1830XA Fall on same level, unspecified, initial encounter: Secondary | ICD-10-CM | POA: Diagnosis present

## 2017-07-12 DIAGNOSIS — S42251D Displaced fracture of greater tuberosity of right humerus, subsequent encounter for fracture with routine healing: Secondary | ICD-10-CM | POA: Diagnosis not present

## 2017-07-12 DIAGNOSIS — S72001A Fracture of unspecified part of neck of right femur, initial encounter for closed fracture: Secondary | ICD-10-CM | POA: Diagnosis not present

## 2017-07-12 DIAGNOSIS — R1311 Dysphagia, oral phase: Secondary | ICD-10-CM | POA: Diagnosis not present

## 2017-07-12 DIAGNOSIS — M170 Bilateral primary osteoarthritis of knee: Secondary | ICD-10-CM | POA: Diagnosis not present

## 2017-07-12 DIAGNOSIS — I2510 Atherosclerotic heart disease of native coronary artery without angina pectoris: Secondary | ICD-10-CM | POA: Diagnosis present

## 2017-07-12 DIAGNOSIS — Z8674 Personal history of sudden cardiac arrest: Secondary | ICD-10-CM | POA: Diagnosis not present

## 2017-07-12 DIAGNOSIS — S42309A Unspecified fracture of shaft of humerus, unspecified arm, initial encounter for closed fracture: Secondary | ICD-10-CM | POA: Diagnosis present

## 2017-07-12 DIAGNOSIS — X370XXA Hurricane, initial encounter: Secondary | ICD-10-CM | POA: Diagnosis present

## 2017-07-12 DIAGNOSIS — F329 Major depressive disorder, single episode, unspecified: Secondary | ICD-10-CM | POA: Diagnosis present

## 2017-07-12 DIAGNOSIS — S42251A Displaced fracture of greater tuberosity of right humerus, initial encounter for closed fracture: Secondary | ICD-10-CM | POA: Diagnosis not present

## 2017-07-12 DIAGNOSIS — M79652 Pain in left thigh: Secondary | ICD-10-CM | POA: Diagnosis not present

## 2017-07-12 DIAGNOSIS — E785 Hyperlipidemia, unspecified: Secondary | ICD-10-CM | POA: Diagnosis present

## 2017-07-12 DIAGNOSIS — Z79891 Long term (current) use of opiate analgesic: Secondary | ICD-10-CM

## 2017-07-12 DIAGNOSIS — W19XXXA Unspecified fall, initial encounter: Secondary | ICD-10-CM

## 2017-07-12 DIAGNOSIS — Z79899 Other long term (current) drug therapy: Secondary | ICD-10-CM

## 2017-07-12 DIAGNOSIS — K219 Gastro-esophageal reflux disease without esophagitis: Secondary | ICD-10-CM | POA: Diagnosis present

## 2017-07-12 DIAGNOSIS — Z7982 Long term (current) use of aspirin: Secondary | ICD-10-CM | POA: Diagnosis not present

## 2017-07-12 DIAGNOSIS — M79609 Pain in unspecified limb: Secondary | ICD-10-CM | POA: Diagnosis not present

## 2017-07-12 DIAGNOSIS — R748 Abnormal levels of other serum enzymes: Secondary | ICD-10-CM | POA: Diagnosis not present

## 2017-07-12 DIAGNOSIS — Z825 Family history of asthma and other chronic lower respiratory diseases: Secondary | ICD-10-CM

## 2017-07-12 DIAGNOSIS — M25511 Pain in right shoulder: Secondary | ICD-10-CM | POA: Diagnosis not present

## 2017-07-12 DIAGNOSIS — E119 Type 2 diabetes mellitus without complications: Secondary | ICD-10-CM | POA: Diagnosis present

## 2017-07-12 DIAGNOSIS — M79605 Pain in left leg: Secondary | ICD-10-CM

## 2017-07-12 DIAGNOSIS — Z23 Encounter for immunization: Secondary | ICD-10-CM

## 2017-07-12 DIAGNOSIS — Z7984 Long term (current) use of oral hypoglycemic drugs: Secondary | ICD-10-CM

## 2017-07-12 DIAGNOSIS — R52 Pain, unspecified: Secondary | ICD-10-CM

## 2017-07-12 DIAGNOSIS — I252 Old myocardial infarction: Secondary | ICD-10-CM | POA: Diagnosis not present

## 2017-07-12 DIAGNOSIS — S42211A Unspecified displaced fracture of surgical neck of right humerus, initial encounter for closed fracture: Secondary | ICD-10-CM

## 2017-07-12 DIAGNOSIS — G8911 Acute pain due to trauma: Secondary | ICD-10-CM | POA: Diagnosis not present

## 2017-07-12 DIAGNOSIS — M6282 Rhabdomyolysis: Secondary | ICD-10-CM | POA: Diagnosis present

## 2017-07-12 DIAGNOSIS — G4730 Sleep apnea, unspecified: Secondary | ICD-10-CM | POA: Diagnosis present

## 2017-07-12 DIAGNOSIS — T148XXA Other injury of unspecified body region, initial encounter: Secondary | ICD-10-CM | POA: Diagnosis not present

## 2017-07-12 DIAGNOSIS — Z6841 Body Mass Index (BMI) 40.0 and over, adult: Secondary | ICD-10-CM | POA: Diagnosis not present

## 2017-07-12 DIAGNOSIS — Z90711 Acquired absence of uterus with remaining cervical stump: Secondary | ICD-10-CM

## 2017-07-12 DIAGNOSIS — E876 Hypokalemia: Secondary | ICD-10-CM | POA: Diagnosis present

## 2017-07-12 DIAGNOSIS — S79922A Unspecified injury of left thigh, initial encounter: Secondary | ICD-10-CM | POA: Diagnosis not present

## 2017-07-12 DIAGNOSIS — E1165 Type 2 diabetes mellitus with hyperglycemia: Secondary | ICD-10-CM | POA: Diagnosis not present

## 2017-07-12 DIAGNOSIS — H409 Unspecified glaucoma: Secondary | ICD-10-CM | POA: Diagnosis present

## 2017-07-12 DIAGNOSIS — M79662 Pain in left lower leg: Secondary | ICD-10-CM | POA: Diagnosis not present

## 2017-07-12 DIAGNOSIS — R488 Other symbolic dysfunctions: Secondary | ICD-10-CM | POA: Diagnosis not present

## 2017-07-12 DIAGNOSIS — R011 Cardiac murmur, unspecified: Secondary | ICD-10-CM | POA: Diagnosis present

## 2017-07-12 DIAGNOSIS — Y92000 Kitchen of unspecified non-institutional (private) residence as  the place of occurrence of the external cause: Secondary | ICD-10-CM

## 2017-07-12 DIAGNOSIS — I491 Atrial premature depolarization: Secondary | ICD-10-CM | POA: Diagnosis present

## 2017-07-12 DIAGNOSIS — M6281 Muscle weakness (generalized): Secondary | ICD-10-CM | POA: Diagnosis not present

## 2017-07-12 DIAGNOSIS — R262 Difficulty in walking, not elsewhere classified: Secondary | ICD-10-CM | POA: Diagnosis not present

## 2017-07-12 DIAGNOSIS — S42214D Unspecified nondisplaced fracture of surgical neck of right humerus, subsequent encounter for fracture with routine healing: Secondary | ICD-10-CM | POA: Diagnosis not present

## 2017-07-12 DIAGNOSIS — S42211D Unspecified displaced fracture of surgical neck of right humerus, subsequent encounter for fracture with routine healing: Secondary | ICD-10-CM | POA: Diagnosis not present

## 2017-07-12 LAB — I-STAT TROPONIN, ED: Troponin i, poc: 0.03 ng/mL (ref 0.00–0.08)

## 2017-07-12 LAB — CBC
HCT: 37.2 % (ref 36.0–46.0)
HEMOGLOBIN: 11.8 g/dL — AB (ref 12.0–15.0)
MCH: 26.2 pg (ref 26.0–34.0)
MCHC: 31.7 g/dL (ref 30.0–36.0)
MCV: 82.7 fL (ref 78.0–100.0)
Platelets: 236 10*3/uL (ref 150–400)
RBC: 4.5 MIL/uL (ref 3.87–5.11)
RDW: 14 % (ref 11.5–15.5)
WBC: 15.9 10*3/uL — ABNORMAL HIGH (ref 4.0–10.5)

## 2017-07-12 LAB — URINALYSIS, ROUTINE W REFLEX MICROSCOPIC
BILIRUBIN URINE: NEGATIVE
Glucose, UA: 50 mg/dL — AB
Ketones, ur: 20 mg/dL — AB
LEUKOCYTES UA: NEGATIVE
NITRITE: NEGATIVE
PH: 6 (ref 5.0–8.0)
Protein, ur: 100 mg/dL — AB
SPECIFIC GRAVITY, URINE: 1.01 (ref 1.005–1.030)

## 2017-07-12 LAB — BASIC METABOLIC PANEL
Anion gap: 11 (ref 5–15)
BUN: 5 mg/dL — AB (ref 6–20)
CALCIUM: 9 mg/dL (ref 8.9–10.3)
CHLORIDE: 103 mmol/L (ref 101–111)
CO2: 24 mmol/L (ref 22–32)
CREATININE: 0.52 mg/dL (ref 0.44–1.00)
GFR calc non Af Amer: >60 mL/min (ref >60)
Glucose, Bld: 208 mg/dL — ABNORMAL HIGH (ref 65–99)
Potassium: 3.1 mmol/L — ABNORMAL LOW (ref 3.5–5.1)
SODIUM: 138 mmol/L (ref 135–145)

## 2017-07-12 LAB — GLUCOSE, CAPILLARY: Glucose-Capillary: 171 mg/dL — ABNORMAL HIGH (ref 65–99)

## 2017-07-12 LAB — CK: Total CK: 1452 U/L — ABNORMAL HIGH (ref 38–234)

## 2017-07-12 LAB — HEMOGLOBIN A1C
HEMOGLOBIN A1C: 7.6 % — AB (ref 4.8–5.6)
MEAN PLASMA GLUCOSE: 171.42 mg/dL

## 2017-07-12 MED ORDER — OXYCODONE-ACETAMINOPHEN 5-325 MG PO TABS
1.0000 | ORAL_TABLET | Freq: Once | ORAL | Status: AC
  Administered 2017-07-12: 1 via ORAL
  Filled 2017-07-12: qty 1

## 2017-07-12 MED ORDER — LISINOPRIL 10 MG PO TABS
10.0000 mg | ORAL_TABLET | Freq: Once | ORAL | Status: AC
Start: 2017-07-12 — End: 2017-07-12
  Administered 2017-07-12: 10 mg via ORAL
  Filled 2017-07-12: qty 1

## 2017-07-12 MED ORDER — POTASSIUM CHLORIDE CRYS ER 20 MEQ PO TBCR
40.0000 meq | EXTENDED_RELEASE_TABLET | Freq: Once | ORAL | Status: AC
  Administered 2017-07-12: 40 meq via ORAL
  Filled 2017-07-12: qty 2

## 2017-07-12 MED ORDER — ASPIRIN 325 MG PO TABS
650.0000 mg | ORAL_TABLET | Freq: Every evening | ORAL | Status: DC
  Administered 2017-07-13 – 2017-07-14 (×2): 650 mg via ORAL
  Filled 2017-07-12 (×2): qty 2

## 2017-07-12 MED ORDER — OXYBUTYNIN CHLORIDE ER 5 MG PO TB24
5.0000 mg | ORAL_TABLET | Freq: Every day | ORAL | Status: DC
  Administered 2017-07-13 – 2017-07-15 (×3): 5 mg via ORAL
  Filled 2017-07-12 (×3): qty 1

## 2017-07-12 MED ORDER — MAGNESIUM HYDROXIDE 400 MG/5ML PO SUSP: 15.0000 mL | Freq: Every day | ORAL | Status: DC | PRN

## 2017-07-12 MED ORDER — HYDROCODONE-ACETAMINOPHEN 5-325 MG PO TABS
1.0000 | ORAL_TABLET | ORAL | Status: DC | PRN
  Administered 2017-07-13 – 2017-07-15 (×7): 2 via ORAL
  Filled 2017-07-12 (×7): qty 2

## 2017-07-12 MED ORDER — ONDANSETRON HCL 4 MG/2ML IJ SOLN: 4.0000 mg | Freq: Four times a day (QID) | INTRAMUSCULAR | Status: DC | PRN

## 2017-07-12 MED ORDER — ACETAMINOPHEN 650 MG RE SUPP: 650.0000 mg | Freq: Four times a day (QID) | RECTAL | Status: DC | PRN

## 2017-07-12 MED ORDER — LATANOPROST 0.005 % OP SOLN
1.0000 [drp] | Freq: Every day | OPHTHALMIC | Status: DC
  Administered 2017-07-13 – 2017-07-14 (×3): 1 [drp] via OPHTHALMIC
  Filled 2017-07-12: qty 2.5

## 2017-07-12 MED ORDER — INSULIN ASPART 100 UNIT/ML ~~LOC~~ SOLN
0.0000 [IU] | Freq: Three times a day (TID) | SUBCUTANEOUS | Status: DC
  Administered 2017-07-13: 2 [IU] via SUBCUTANEOUS
  Administered 2017-07-13: 3 [IU] via SUBCUTANEOUS
  Administered 2017-07-13: 5 [IU] via SUBCUTANEOUS
  Administered 2017-07-14: 3 [IU] via SUBCUTANEOUS
  Administered 2017-07-14: 7 [IU] via SUBCUTANEOUS
  Administered 2017-07-14: 3 [IU] via SUBCUTANEOUS
  Administered 2017-07-15: 5 [IU] via SUBCUTANEOUS
  Administered 2017-07-15: 2 [IU] via SUBCUTANEOUS

## 2017-07-12 MED ORDER — LISINOPRIL 20 MG PO TABS
20.0000 mg | ORAL_TABLET | Freq: Every day | ORAL | Status: DC
  Administered 2017-07-13 – 2017-07-15 (×3): 20 mg via ORAL
  Filled 2017-07-12 (×3): qty 1

## 2017-07-12 MED ORDER — GLIPIZIDE ER 10 MG PO TB24
10.0000 mg | ORAL_TABLET | Freq: Every day | ORAL | Status: DC
  Filled 2017-07-12: qty 1

## 2017-07-12 MED ORDER — ZOLPIDEM TARTRATE 5 MG PO TABS: 5.0000 mg | ORAL_TABLET | Freq: Every evening | ORAL | Status: DC | PRN

## 2017-07-12 MED ORDER — CYCLOBENZAPRINE HCL 10 MG PO TABS
5.0000 mg | ORAL_TABLET | Freq: Two times a day (BID) | ORAL | Status: DC | PRN
  Filled 2017-07-12: qty 1

## 2017-07-12 MED ORDER — PRAVASTATIN SODIUM 40 MG PO TABS
40.0000 mg | ORAL_TABLET | Freq: Every day | ORAL | Status: DC
  Administered 2017-07-12 – 2017-07-14 (×3): 40 mg via ORAL
  Filled 2017-07-12 (×3): qty 1

## 2017-07-12 MED ORDER — ACETAMINOPHEN 325 MG PO TABS
650.0000 mg | ORAL_TABLET | Freq: Four times a day (QID) | ORAL | Status: DC | PRN
  Administered 2017-07-12: 650 mg via ORAL
  Filled 2017-07-12: qty 2

## 2017-07-12 MED ORDER — SODIUM CHLORIDE 0.9 % IV BOLUS (SEPSIS)
1000.0000 mL | Freq: Once | INTRAVENOUS | Status: AC
  Administered 2017-07-12: 1000 mL via INTRAVENOUS

## 2017-07-12 MED ORDER — MORPHINE SULFATE (PF) 4 MG/ML IV SOLN
2.0000 mg | INTRAVENOUS | Status: DC | PRN
  Filled 2017-07-12: qty 1

## 2017-07-12 MED ORDER — AMLODIPINE BESYLATE 5 MG PO TABS
5.0000 mg | ORAL_TABLET | Freq: Every day | ORAL | Status: DC
  Administered 2017-07-13 – 2017-07-15 (×3): 5 mg via ORAL
  Filled 2017-07-12 (×3): qty 1

## 2017-07-12 MED ORDER — ENOXAPARIN SODIUM 40 MG/0.4ML ~~LOC~~ SOLN
40.0000 mg | SUBCUTANEOUS | Status: DC
  Administered 2017-07-12 – 2017-07-14 (×3): 40 mg via SUBCUTANEOUS
  Filled 2017-07-12 (×3): qty 0.4

## 2017-07-12 MED ORDER — AMLODIPINE BESYLATE 5 MG PO TABS
5.0000 mg | ORAL_TABLET | Freq: Once | ORAL | Status: AC
  Administered 2017-07-12: 5 mg via ORAL
  Filled 2017-07-12: qty 1

## 2017-07-12 MED ORDER — METOPROLOL SUCCINATE ER 100 MG PO TB24
100.0000 mg | ORAL_TABLET | Freq: Every day | ORAL | Status: DC
  Administered 2017-07-13 – 2017-07-15 (×3): 100 mg via ORAL
  Filled 2017-07-12 (×3): qty 1

## 2017-07-12 MED ORDER — ONDANSETRON HCL 4 MG PO TABS: 4.0000 mg | ORAL_TABLET | Freq: Four times a day (QID) | ORAL | Status: DC | PRN

## 2017-07-12 MED ORDER — INSULIN ASPART 100 UNIT/ML ~~LOC~~ SOLN
0.0000 [IU] | Freq: Every day | SUBCUTANEOUS | Status: DC
  Administered 2017-07-13 – 2017-07-14 (×2): 2 [IU] via SUBCUTANEOUS

## 2017-07-12 MED ORDER — LINAGLIPTIN 5 MG PO TABS: 5.0000 mg | ORAL_TABLET | Freq: Every day | ORAL | Status: DC

## 2017-07-12 MED ORDER — KCL IN DEXTROSE-NACL 10-5-0.45 MEQ/L-%-% IV SOLN
INTRAVENOUS | Status: DC
  Administered 2017-07-12: 22:00:00 via INTRAVENOUS
  Filled 2017-07-12 (×2): qty 1000

## 2017-07-12 MED ORDER — ALBUTEROL SULFATE (2.5 MG/3ML) 0.083% IN NEBU: 2.5000 mg | INHALATION_SOLUTION | RESPIRATORY_TRACT | Status: DC | PRN

## 2017-07-12 MED ORDER — PANTOPRAZOLE SODIUM 40 MG PO TBEC
40.0000 mg | DELAYED_RELEASE_TABLET | Freq: Every day | ORAL | Status: DC
  Administered 2017-07-13 – 2017-07-15 (×3): 40 mg via ORAL
  Filled 2017-07-12 (×3): qty 1

## 2017-07-12 MED ORDER — FUROSEMIDE 20 MG PO TABS: 20.0000 mg | ORAL_TABLET | Freq: Every day | ORAL | Status: DC

## 2017-07-12 NOTE — ED Notes (Signed)
Patient transported to X-ray 

## 2017-07-12 NOTE — Progress Notes (Signed)
Orthopedic Tech Progress Note Patient Details:  Whitney [name] 04/04/1956 [id number]  Ortho Devices Type of Ortho Device: Shoulder immobilizer Ortho Device/Splint Location: applied sling/shoulder immobilizer to pt right arm/shoulder.  pt tolerated appllication well.  Right Arm Ortho Device/Splint Interventions: Application, Adjustment   Kristopher Oppenheim 07/12/2017, 10:36 PM

## 2017-07-12 NOTE — ED Provider Notes (Signed)
[facility] DEPT Provider Note   CSN: [id number] Arrival date & time: 07/12/17  1420  History   Chief Complaint Chief Complaint  Patient presents with  . Fall   HPI Whitney [name] is a 61 y.o. female.  The patient is a 61yo female with a past medical history significant for DM, MI, prior cardiac arrest, HFrEF (LV 30-35% in 11/06/11), hypertension, hyperlipidemia, and sleep apnea, who presents to the ED via EMS after a fall.  The patient uses a walker at baseline.  Yesterday at ~10AM (~29 hours prior to my evaluation), the patient had a mechanical fall in her kitchen.  She was holding onto the kitchen countertop prior to the fall.  She denies dizziness and lightheadedness prior to the fall.  She was unable to get off the floor and subsequently lost power due to the [facility], and was on her floor until her sister found her today.  Her sister had called on the phone and was unable to get through.  When she arrived at the patient's home, she was screaming for help.  At this time, the patient is complaining of pain to her right shoulder and entire left lower extremity.  No head, neck, or back pain.   The history is provided by the patient and medical records. No language interpreter was used.   Past Medical History:  Diagnosis Date  . Arthritis    left knee,right hip  . Cardiac arrest ([place])   . Depression   . Diabetes mellitus   . GERD (gastroesophageal reflux disease)   . Glaucoma   . H/O cardiac arrest 11/2011  . Heart murmur   . Hyperlipidemia   . Hypertension   . Myocardial infarction ([place]) 2013  . Sleep apnea    Bring machine,mask and tubing   Patient Active Problem List   Diagnosis Date Noted  . Morbid obesity due to excess calories ([place]) 12/14/2015  . Chronic systolic heart failure ([place]) 12/14/2015  . Chest pain 12/14/2015  . Angina decubitus ([place]) 12/14/2015  . Adiposity 01/06/2013  . Arthritis of knee, degenerative 01/06/2013  . OSA (obstructive sleep apnea)  11/30/2011  . Cardiac arrest ([place]) 11/13/2011  . Diabetes mellitus type 2, uncontrolled ([place]) 11/13/2011   Past Surgical History:  Procedure Laterality Date  . BACK SURGERY    . COLONOSCOPY N/A 09/12/2016   Procedure: COLONOSCOPY;  Surgeon: Clarene Essex, MD;  [facility];  Service: Endoscopy;  Laterality: N/A;  . CYSTOSCOPY W/ URETERAL STENT PLACEMENT Left 01/04/2013   Procedure: CYSTOSCOPY WITH RETROGRADE PYELOGRAM/URETERAL STENT PLACEMENT;  Surgeon: Bernestine Amass, MD;  [facility];  Service: Urology;  Laterality: Left;  . CYSTOSCOPY WITH RETROGRADE PYELOGRAM, URETEROSCOPY AND STENT PLACEMENT Left 01/26/2013   Procedure: CYSTOSCOPY, JJ STENT REMOVAL, LEFT URETEROSCOPY, ;  Surgeon: Bernestine Amass, MD;  [facility];  Service: Urology;  Laterality: Left;  CYSTO, JJ STENT REMOVAL, LEFT URETEROSCOPY   . FOOT SURGERY    . HERNIA REPAIR    . HOLMIUM LASER APPLICATION Left 04/06/2375   Procedure: HOLMIUM LASER LITHOTRIPSY ;  Surgeon: Bernestine Amass, MD;  [facility];  Service: Urology;  Laterality: Left;  . KNEE SURGERY     left  . PARTIAL HYSTERECTOMY     OB History    No data available     Home Medications    Prior to Admission medications   Medication Sig Start Date End Date Taking? Authorizing Provider  amLODipine (NORVASC) 5 MG tablet Take 5 mg by  mouth daily. 11/07/15   [provider]  aspirin 325 MG tablet Take 650 mg by mouth every evening.     [provider]  bimatoprost (LUMIGAN) 0.01 % SOLN Place 1 drop into both eyes at bedtime.    [provider]  clobetasol ointment (TEMOVATE) 1.06 % Apply 1 application topically 2 (two) times daily as needed (itching).  11/20/15   [provider]  cyclobenzaprine (FLEXERIL) 10 MG tablet Take 0.5 tablets (5 mg total) by mouth 2 (two) times daily as needed for muscle spasms. 01/08/17   Glendell Docker, NP  furosemide (LASIX) 20 MG tablet Take 1 tablet (20 mg total) by mouth daily.  12/14/15   Jerline Pain, MD  glipiZIDE (GLUCOTROL XL) 10 MG 24 hr tablet Take 10 mg by mouth daily. 12/08/15   [provider]  lisinopril (PRINIVIL,ZESTRIL) 20 MG tablet Take 20 mg by mouth daily before breakfast.     [provider]  magnesium hydroxide (MILK OF MAGNESIA) 400 MG/5ML suspension Take 15 mLs by mouth daily as needed for mild constipation.    [provider]  metoprolol succinate (TOPROL-XL) 100 MG 24 hr tablet Take 100 mg by mouth daily before breakfast. Take with or immediately following a meal.    [provider]  ONGLYZA 5 MG TABS tablet Take 5 mg by mouth daily. 11/22/15   [provider]  oxybutynin (DITROPAN-XL) 5 MG 24 hr tablet Take 5 mg by mouth daily.    [provider]  pantoprazole (PROTONIX) 40 MG tablet Take 40 mg by mouth daily.    [provider]  pravastatin (PRAVACHOL) 40 MG tablet Take 40 mg by mouth at bedtime.     [provider]  traMADol (ULTRAM) 50 MG tablet Take 100 mg by mouth 2 (two) times daily as needed for severe pain. For pain    [provider]   Family History Family History  Problem Relation Age of Onset  . Asthma Mother    Social History Social History  Substance Use Topics  . Smoking status: Never Smoker  . Smokeless tobacco: Never Used  . Alcohol use No   Allergies   Contrast media [iodinated diagnostic agents]  Review of Systems Review of Systems  Constitutional: Negative for chills and fever.  HENT: Negative for ear pain and sore throat.   Eyes: Negative.   Respiratory: Negative for cough and shortness of breath.   Cardiovascular: Negative for chest pain and palpitations.  Gastrointestinal: Negative for abdominal pain and vomiting.  Genitourinary: Negative for dysuria and hematuria.  Musculoskeletal: Positive for arthralgias (shoulder pain). Negative for back pain.       Left lower extremity pain  Skin: Negative for color change and rash.    Allergic/Immunologic: Negative for immunocompromised state.  Neurological: Negative for seizures and syncope.  Hematological: Negative.   Psychiatric/Behavioral: Negative.   All other systems reviewed and are negative.  Physical Exam Updated Vital Signs There were no vitals taken for this visit.  Physical Exam  Constitutional: She is oriented to person, place, and time. She appears well-developed and well-nourished. No distress.  Morbidly obese woman in NAD; exam significantly limited due to body habitus  HENT:  Head: Normocephalic and atraumatic.  Mouth/Throat: Oropharynx is clear and moist.  Eyes: Conjunctivae and EOM are normal.  Neck: Neck supple.  No c-spine ttp  Cardiovascular: Regular rhythm and intact distal pulses.   Murmur heard. bradycardia  Pulmonary/Chest: Effort normal and breath sounds normal. No respiratory distress.  She has no wheezes.  Abdominal: Soft. She exhibits no distension. There is no tenderness. There is no guarding.  Musculoskeletal: She exhibits tenderness (right shoulder, left thigh, and left leg; all extremities with grossly normal motor function and sensation; 2+ pulses in all 4 extremities). She exhibits no edema.  Neurological: She is alert and oriented to person, place, and time.  Skin: Skin is warm and dry.  Psychiatric: She has a normal mood and affect. Her behavior is normal. Judgment and thought content normal.  Nursing note and vitals reviewed.  ED Treatments / Results  Labs (all labs ordered are listed, but only abnormal results are displayed) Labs Reviewed - No data to display  EKG  EKG Interpretation None      EKG: - Rate: bradycardia - Rhythm: normal sinus - Axis: no axis deviation - Intervals: normal PR, narrow QRS complex, and normal QTc - ST/T waves: no T wave or ST changes suggestive of ischemia or infarct - Comparison to prior: new bradycardia when compared to EKG from 08/02/2014  Radiology No results  found.  Procedures Procedures (including critical care time)  Medications Ordered in ED Medications - No data to display  Initial Impression / Assessment and Plan / ED Course  I have reviewed the triage vital signs and the nursing notes.  Pertinent labs & imaging results that were available during my care of the patient were reviewed by me and considered in my medical decision making (see chart for details).    Initial differential diagnosis included syncope, mechanical fall, electrolyte abnormality, fracture, dislocation, cellulitis, DVT, and rhabdomyolysis.  Pertinent labs included CBC with leukocytosis, normocytic anemia, and a normal platelet count.  BMP notable for hypokalemia and hyperglycemia; no AKI or anion gap.  Initial troponin negative.  CK elevated to 1452.  UA without evidence of infection, however ketones and protein noted.  EKG notable for sinus bradycardia.  Imaging studies included a CXR as well as x-rays of the right shoulder and left lower extremity that revealed a right proximal humerus fracture.  No acute cardiopulmonary abnormalities on CXR.  Left femur and tibia/fibular x-rays without fracture.  Degenerative changes of the left knee noted.  The patient was started on IVF for elevated CK, however at a slow rate due to her reduced EF.   Upon reassessment, she had no new symptoms or complaints, however she continued to complain of pain to her LLE and I therefore ordered a venous Duplex study (exam significantly limited due to habitus).  Her pain was managed with percocet.  I consulted Orthopaedics who requested a CT scan of the patient's right shoulder that is pending.   The patient was admitted to the Hospitalist service for further evaluation and treatment.  The patient was in stable condition at the time of admission.  Final Clinical Impressions(s) / ED Diagnoses   Final diagnoses:  Fall, initial encounter  Acute pain of right shoulder  Left leg pain  Fx humeral  neck, right, closed, initial encounter  Elevated CK   New Prescriptions New Prescriptions   No medications on file     Charisse March, MD 07/13/17 [id number]    Davonna Belling, MD 07/13/17 [phone]
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
## 2017-07-12 NOTE — ED Notes (Signed)
Patient transported to CT 

## 2017-07-12 NOTE — Discharge Instructions (Signed)
Keep right arm in shoulder immobilizer Follow-up in 7 days for repeat radiographs at [facility]

## 2017-07-12 NOTE — Consult Note (Signed)
Reason for Consult:right shoulder pain Referring Physician: Dr Whitney [name] is an 61 y.o. female.  HPI: Whitney [name] is a 61 year old female who fell yesterday injuring her right arm.  She was admitted to the emergency room and there was found to have proximal humerus fracture.  CT scan was obtained which showed fracture mildly displaced through the anatomic neck.  Overall alignment is intact.  She is right-hand dominant.  She does live alone but has family nearby.  She has multiple other medical problems including morbid obesity.  Past Medical History:  Diagnosis Date  . Arthritis    left knee,right hip  . Cardiac arrest ([place])   . Depression   . Diabetes mellitus   . GERD (gastroesophageal reflux disease)   . Glaucoma   . H/O cardiac arrest 11/2011  . Heart murmur   . Hyperlipidemia   . Hypertension   . Myocardial infarction ([place]) 2013  . Sleep apnea    Bring machine,mask and tubing    Past Surgical History:  Procedure Laterality Date  . BACK SURGERY    . COLONOSCOPY N/A 09/12/2016   Procedure: COLONOSCOPY;  Surgeon: Clarene Essex, MD;  Location: [facility];  Service: Endoscopy;  Laterality: N/A;  . CYSTOSCOPY W/ URETERAL STENT PLACEMENT Left 01/04/2013   Procedure: CYSTOSCOPY WITH RETROGRADE PYELOGRAM/URETERAL STENT PLACEMENT;  Surgeon: Bernestine Amass, MD;  Location: [facility];  Service: Urology;  Laterality: Left;  . CYSTOSCOPY WITH RETROGRADE PYELOGRAM, URETEROSCOPY AND STENT PLACEMENT Left 01/26/2013   Procedure: CYSTOSCOPY, JJ STENT REMOVAL, LEFT URETEROSCOPY, ;  Surgeon: Bernestine Amass, MD;  Location: [facility];  Service: Urology;  Laterality: Left;  CYSTO, JJ STENT REMOVAL, LEFT URETEROSCOPY   . FOOT SURGERY    . HERNIA REPAIR    . HOLMIUM LASER APPLICATION Left 12/25/7122   Procedure: HOLMIUM LASER LITHOTRIPSY ;  Surgeon: Bernestine Amass, MD;  Location: [facility];  Service: Urology;  Laterality: Left;  . KNEE SURGERY     left  . PARTIAL HYSTERECTOMY      Family History   Problem Relation Age of Onset  . Asthma Mother     Social History:  reports that she has never smoked. She has never used smokeless tobacco. She reports that she does not drink alcohol or use drugs.  Allergies:  Allergies  Allergen Reactions  . Contrast Media [Iodinated Diagnostic Agents] Anaphylaxis    Medications: I have reviewed the patient's current medications.  Results for orders placed or performed during the hospital encounter of 07/12/17 (from the past 48 hour(s))  CBC     Status: Abnormal   Collection Time: 07/12/17  3:25 PM  Result Value Ref Range   WBC 15.9 (H) 4.0 - 10.5 K/uL   RBC 4.50 3.87 - 5.11 MIL/uL   Hemoglobin 11.8 (L) 12.0 - 15.0 g/dL   HCT 37.2 36.0 - 46.0 %   MCV 82.7 78.0 - 100.0 fL   MCH 26.2 26.0 - 34.0 pg   MCHC 31.7 30.0 - 36.0 g/dL   RDW 14.0 11.5 - 15.5 %   Platelets 236 150 - 400 K/uL  Basic metabolic panel     Status: Abnormal   Collection Time: 07/12/17  3:25 PM  Result Value Ref Range   Sodium 138 135 - 145 mmol/L   Potassium 3.1 (L) 3.5 - 5.1 mmol/L   Chloride 103 101 - 111 mmol/L   CO2 24 22 - 32 mmol/L   Glucose, Bld 208 (H) 65 - 99 mg/dL  BUN 5 (L) 6 - 20 mg/dL   Creatinine, Ser 0.52 0.44 - 1.00 mg/dL   Calcium 9.0 8.9 - 10.3 mg/dL   GFR calc non Af Amer >60 >60 mL/min   GFR calc Af Amer >60 >60 mL/min    Comment: (NOTE) The eGFR has been calculated using the CKD EPI equation. This calculation has not been validated in all clinical situations. eGFR's persistently <60 mL/min signify possible Chronic Kidney Disease.    Anion gap 11 5 - 15  CK     Status: Abnormal   Collection Time: 07/12/17  3:25 PM  Result Value Ref Range   Total CK 1,452 (H) 38 - 234 U/L  I-Stat Troponin, ED (not at [facility])     Status: None   Collection Time: 07/12/17  3:39 PM  Result Value Ref Range   Troponin i, poc 0.03 0.00 - 0.08 ng/mL   Comment 3            Comment: Due to the release kinetics of cTnI, a negative result within the first hours of  the onset of symptoms does not rule out myocardial infarction with certainty. If myocardial infarction is still suspected, repeat the test at appropriate intervals.   Urinalysis, Routine w reflex microscopic     Status: Abnormal   Collection Time: 07/12/17  6:26 PM  Result Value Ref Range   Color, Urine YELLOW YELLOW   APPearance CLEAR CLEAR   Specific Gravity, Urine 1.010 1.005 - 1.030   pH 6.0 5.0 - 8.0   Glucose, UA 50 (A) NEGATIVE mg/dL   Hgb urine dipstick SMALL (A) NEGATIVE   Bilirubin Urine NEGATIVE NEGATIVE   Ketones, ur 20 (A) NEGATIVE mg/dL   Protein, ur 100 (A) NEGATIVE mg/dL   Nitrite NEGATIVE NEGATIVE   Leukocytes, UA NEGATIVE NEGATIVE   RBC / HPF 6-30 0 - 5 RBC/hpf   WBC, UA 0-5 0 - 5 WBC/hpf   Bacteria, UA RARE (A) NONE SEEN   Squamous Epithelial / LPF 0-5 (A) NONE SEEN   Mucus PRESENT     Dg Chest 1 View  Result Date: 07/12/2017 CLINICAL DATA:  [place] onto right shoulder. EXAM: CHEST 1 VIEW COMPARISON:  01/04/2013 FINDINGS: Lungs are clear. Heart is mildly enlarged but may be accentuated by the AP supine technique. Negative for a pneumothorax. Bridging osteophytes along the right side of thoracic spine. Fracture involving the proximal right humerus. Linear density along the mid left clavicle could be related to overlying scapula but this area is incompletely evaluated. IMPRESSION: No acute cardiopulmonary disease. Fracture of the proximal right humerus. Incomplete evaluation of the left clavicle. If there is concern for an injury in this area, recommend dedicated images. Electronically Signed   By: Markus Daft M.D.   On: 07/12/2017 17:28   Dg Shoulder Right  Result Date: 07/12/2017 CLINICAL DATA:  Fall.  Right shoulder pain. EXAM: RIGHT SHOULDER - 2+ VIEW COMPARISON:  No recent prior. FINDINGS: Displaced comminuted fracture of the proximal right humeral neck noted. No evidence of dislocation . Acromioclavicular degenerative change present. IMPRESSION: Displaced  comminuted fracture of the proximal right humeral neck. Electronically Signed   By: Marcello Moores  Register   On: 07/12/2017 17:26   Dg Tibia/fibula Left  Result Date: 07/12/2017 CLINICAL DATA:  Left leg pain post fall. EXAM: LEFT TIBIA AND FIBULA - 2 VIEW COMPARISON:  None. FINDINGS: There is no evidence of fracture. Suboptimal visualization of the left knee severe osteoarthritic changes with joint space loss, subchondral sclerosis, remodeling  and cyst formation an osteophyte formation. Soft tissues are unremarkable. IMPRESSION: No definite evidence of fracture. Suboptimal visualization of the left knee, which demonstrates 3 compartment severe osteoarthritic changes. Electronically Signed   By: Fidela Salisbury M.D.   On: 07/12/2017 17:28   Ct Shoulder Right Wo Contrast  Result Date: 07/12/2017 CLINICAL DATA:  Right humerus fracture due to a fall today. Initial encounter. EXAM: CT OF THE UPPER RIGHT EXTREMITY WITHOUT CONTRAST TECHNIQUE: Multidetector CT imaging of the upper right extremity was performed according to the standard protocol. COMPARISON:  Plain films right shoulder earlier today. FINDINGS: Bones/Joint/Cartilage As seen on the comparison plain films, the patient has a mildly impacted surgical neck fracture of the right humerus. The fracture shows slight medial displacement. The lesser tuberosity is spared. The fracture involves the greater tuberosity which is mildly comminuted and posteriorly and inferiorly displaced. The humeral head is located and the acromioclavicular joint is intact. No other fracture is identified. No lytic or sclerotic bony lesion. Ligaments Suboptimally assessed by CT. Muscles and Tendons The rotator cuff appears intact. Soft tissues Imaged lung parenchyma is clear. IMPRESSION: Mildly impacted and medially displaced surgical neck fracture of the right humerus extends into the greater tuberosity which is mildly comminuted and displaced. Acromioclavicular osteoarthritis.  Electronically Signed   By: Inge Rise M.D.   On: 07/12/2017 18:52   Dg Femur Min 2 Views Left  Result Date: 07/12/2017 CLINICAL DATA:  Fall.  Pain left lower extremity. EXAM: LEFT FEMUR 2 VIEWS COMPARISON:  No recent prior. FINDINGS: Left femur is intact. Left hip is intact. Severe degenerative changes left knee. Deformity noted the medial tibial plateau, this may be from degenerative change. Subtle fracture cannot be excluded . IMPRESSION: 1.  No acute left femoral abnormality identified. 2. Severe degenerative changes left knee.Deformity noted of the medial tibial plateau. This may be from degenerative change. Subtle fracture cannot be excluded. If need be further evaluation with MRI can be obtained. Electronically Signed   By: Marcello Moores  Register   On: 07/12/2017 17:29    Review of Systems  Musculoskeletal: Positive for joint pain.  All other systems reviewed and are negative.  Blood pressure (!) 160/63, [name] (!) 109, temperature 99 F (37.2 C), temperature source Oral, resp. rate (!) 24, SpO2 97 %. Physical Exam  Constitutional: She appears well-developed.  HENT:  Head: Normocephalic.  Eyes: Pupils are equal, round, and reactive to light.  Neck: Normal range of motion.  Cardiovascular: Normal rate.   Respiratory: Effort normal.  Neurological: She is alert.  Skin: Skin is warm.  Psychiatric: She has a normal mood and affect.  examination of the right shoulder demonstrates that the shoulder is in a sling.  Radial pulses intact to the right hand.  EPL FPL interosseous is intact.  No paresthesias in the right arm noted.  There is some swelling.  Patient has bilateral palpable pedal pulses with no crepitus with ankle knee or hip range of motion passively or actively.  There is some evidence of venous stasis and potential early cellulitis in that left lower extremity.  Left upper extremity asymptomatic  Assessment/Plan: Impression is right proximal humerus fracture in reasonable  alignment at this time.  Discussed with Whitney [name] at length the operative and nonoperative treatment options for this problem.  At the age of 66 we want to avoid any type of arthroplasty if possible.  I think with its current alignment the blood supply understand the best chance of being preserved.  Advise sling immobilization for  at least 3 weeks with repeat radiographs in a week to assess fracture displacement and/or consolidation.  She will need some pain medicine.  We will change her from her makeshift sling to a shoulder immobilizer.  Follow-up with me in 7 days for clinical recheck and repeat radiographs  G Alphonzo Severance 07/12/2017, 8:56 PM

## 2017-07-12 NOTE — ED Notes (Signed)
purewick catheter placed on patient 
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
## 2017-07-13 ENCOUNTER — Inpatient Hospital Stay (HOSPITAL_COMMUNITY): Payer: Medicare Other

## 2017-07-13 DIAGNOSIS — M79609 Pain in unspecified limb: Secondary | ICD-10-CM

## 2017-07-13 DIAGNOSIS — E1165 Type 2 diabetes mellitus with hyperglycemia: Secondary | ICD-10-CM

## 2017-07-13 DIAGNOSIS — M25511 Pain in right shoulder: Secondary | ICD-10-CM

## 2017-07-13 DIAGNOSIS — R748 Abnormal levels of other serum enzymes: Secondary | ICD-10-CM

## 2017-07-13 DIAGNOSIS — I10 Essential (primary) hypertension: Secondary | ICD-10-CM

## 2017-07-13 DIAGNOSIS — S42211A Unspecified displaced fracture of surgical neck of right humerus, initial encounter for closed fracture: Principal | ICD-10-CM

## 2017-07-13 LAB — BASIC METABOLIC PANEL
ANION GAP: 10 (ref 5–15)
BUN: 5 mg/dL — ABNORMAL LOW (ref 6–20)
CHLORIDE: 106 mmol/L (ref 101–111)
CO2: 23 mmol/L (ref 22–32)
Calcium: 8.8 mg/dL — ABNORMAL LOW (ref 8.9–10.3)
Creatinine, Ser: 0.5 mg/dL (ref 0.44–1.00)
GFR calc Af Amer: >60 mL/min (ref >60)
Glucose, Bld: 188 mg/dL — ABNORMAL HIGH (ref 65–99)
POTASSIUM: 3.2 mmol/L — AB (ref 3.5–5.1)
SODIUM: 139 mmol/L (ref 135–145)

## 2017-07-13 LAB — GLUCOSE, CAPILLARY
GLUCOSE-CAPILLARY: 169 mg/dL — AB (ref 65–99)
GLUCOSE-CAPILLARY: 258 mg/dL — AB (ref 65–99)
GLUCOSE-CAPILLARY: 236 mg/dL — AB (ref 65–99)
Glucose-Capillary: 246 mg/dL — ABNORMAL HIGH (ref 65–99)

## 2017-07-13 LAB — CK: CK TOTAL: 677 U/L — AB (ref 38–234)

## 2017-07-13 MED ORDER — POTASSIUM CHLORIDE 20 MEQ PO PACK
40.0000 meq | PACK | Freq: Once | ORAL | Status: AC
  Administered 2017-07-13: 40 meq via ORAL
  Filled 2017-07-13: qty 2

## 2017-07-13 MED ORDER — INFLUENZA VAC SPLIT QUAD 0.5 ML IM SUSY
0.5000 mL | PREFILLED_SYRINGE | INTRAMUSCULAR | Status: AC
  Administered 2017-07-15: 0.5 mL via INTRAMUSCULAR
  Filled 2017-07-13: qty 0.5

## 2017-07-13 MED ORDER — PNEUMOCOCCAL VAC POLYVALENT 25 MCG/0.5ML IJ INJ
0.5000 mL | INJECTION | INTRAMUSCULAR | Status: AC
  Administered 2017-07-15: 0.5 mL via INTRAMUSCULAR
  Filled 2017-07-13: qty 0.5

## 2017-07-13 MED ORDER — KETOROLAC TROMETHAMINE 15 MG/ML IJ SOLN
15.0000 mg | Freq: Four times a day (QID) | INTRAMUSCULAR | Status: DC | PRN
Start: 2017-07-13 — End: 2017-07-15
  Administered 2017-07-13 – 2017-07-14 (×3): 15 mg via INTRAVENOUS
  Filled 2017-07-13 (×3): qty 1

## 2017-07-13 MED ORDER — SODIUM CHLORIDE 0.9 % IV SOLN
INTRAVENOUS | Status: DC
  Administered 2017-07-13: 11:00:00 via INTRAVENOUS

## 2017-07-13 NOTE — Progress Notes (Signed)
PROGRESS NOTE    Whitney [name]  JZP:[id number] DOB: Jan 03, 1956 DOA: 07/12/2017 PCP: Darcus Austin, MD    Brief Narrative:  61 year old female who presented after sustaining a fall. She does have a significant past medical history for coronary artery disease with ischemic cardiomyopathy left ventricle ejection fraction 30-35%, type 2 diabetes mellitus and hypertension. Apparently she sustained a mechanical fall in her kitchen, no head trauma or syncope, after the fall she was not able to stand back on her feet. On the initial physical examination blood pressure 176/67, heart rate 73, temperature 98.4, respiratory rate 15, oxygen saturation 100%. Moist mucous membranes, her lungs were clear to auscultation bilaterally, no wheezes, rales or rhonchi, heart S1-S2 present and rhythmic, positive systolic murmur the abdomen was soft nontender, no lower extremity edema. Sodium 138, potassium 3.1, chloride 103, bicarbonate 24, glucose 208, BUN 5, creatinine 0.52, CPK 1452, white count 15, hemoglobin 9.8, hematocrit 37.2, platelets 236. Urine analysis native or infection. Chest x-ray with good penetration, no infiltrates, effusions or pneumothorax. Shoulder films with displaced comminuted fracture of the proximal right humeral neck. CT with mildly impacted and medially displaced surgical neck fracture of the right humerus extends into the greater tuberosity which is mildly comminuted and displaced. EKG sinus rhythm, normal intervals, normal axis, premature atrial complexes.   Patient admitted to the hospital with a working diagnosis of right humeral fracture, complicated by rhabdomyolysis.    Assessment & Plan:   Active Problems:   Humerus fracture   1. Right humeral fracture. Will continue conservative not operative treatment, will continue pain control with po hydrocodone-acetaminophen, will change IV morphine to IV ketorolac, to avoid excessive narcotics. Will continue to follow up with orthopedic  recommendations.   2. Rhabdomyolysis. Will decrease rate of IV fluids, will continue isotonic crystalloid solutions, with NS at 50 ml per hour. CPK trending down. Renal function preserved with serum cr at 0.50 and serum bicarbonate at 23. Follow renal panel in am, no need to keep checking CK.   3. Hypertension. Blood pressure systolic 480 to 165, will continue antihypertensive regimen with amlodipine, lisinopril and metoprolol.   4. Diabetes mellitus type 2. Will continue glucose cover and monitoring with iss, capillary glucose 171, 169, 258. Patient tolerating po well. At home on glipizide and onglyza, will hold on oral hypoglycemic agents while acutely ill.   5. Hypokalemia. Continue repletion with kcl po, will follow on renal panel in am, target K at 4. Will continue to hold on furosemide for now.    DVT prophylaxis: enoxaparin  Code Status: full Family Communication:  Disposition Plan: Home or SNF   Consultants:   Orthopedics  Procedures:     Antimicrobials:       Subjective: Positive tight shoulder pain, moderate in intensity, worse with movement, improved with analgesics, no radiation, nausea or vomiting, no chest pain or dyspnea. Decrease range of motion due to pain.   Objective: Vitals:   07/12/17 1936 07/12/17 2003 07/12/17 2100 07/13/17 0628  BP:  (!) 160/63 (!) 144/65 (!) 153/67  Pulse:  (!) 109 70 79  Resp:  (!) 24 (!) 24   Temp: 98.3 F (36.8 C) 99 F (37.2 C) 99 F (37.2 C) 98.6 F (37 C)  TempSrc: Oral Oral Oral Oral  SpO2:  97% 100% 93%   No intake or output data in the 24 hours ending 07/13/17 1010 There were no vitals filed for this visit.  Examination:  General: Not in pain or dyspnea Neurology: Awake and alert,  non focal  E ENT: no pallor, no icterus, oral mucosa moist Cardiovascular: No JVD. S1-S2 present, rhythmic, no gallops, positive systolic murmur, 3/6, at the left sternal border with radiation into the axilla, no rubs. Positive non  pitting  lower extremity edema ++. Pulmonary: vesicular breath sounds bilaterally, adequate air movement but decreases at bases bilaterally, no wheezing, rhonchi or rales. Gastrointestinal. Abdomen protuberant, no organomegaly, non tender, no rebound or guarding Skin. Dry skin.  Musculoskeletal: no joint deformities     Data Reviewed: I have personally reviewed following labs and imaging studies  CBC:  Recent Labs Lab 07/12/17 1525  WBC 15.9*  HGB 11.8*  HCT 37.2  MCV 82.7  PLT 947   Basic Metabolic Panel:  Recent Labs Lab 07/12/17 1525 07/13/17 0302  NA 138 139  K 3.1* 3.2*  CL 103 106  CO2 24 23  GLUCOSE 208* 188*  BUN 5* 5*  CREATININE 0.52 0.50  CALCIUM 9.0 8.8*   GFR: CrCl cannot be calculated (Unknown ideal weight.). Liver Function Tests: No results for input(s): AST, ALT, ALKPHOS, BILITOT, PROT, ALBUMIN in the last 168 hours. No results for input(s): LIPASE, AMYLASE in the last 168 hours. No results for input(s): AMMONIA in the last 168 hours. Coagulation Profile: No results for input(s): INR, PROTIME in the last 168 hours. Cardiac Enzymes:  Recent Labs Lab 07/12/17 1525 07/13/17 0302  CKTOTAL 1,452* 677*   BNP (last 3 results) No results for input(s): PROBNP in the last 8760 hours. HbA1C:  Recent Labs  07/12/17 2114  HGBA1C 7.6*   CBG:  Recent Labs Lab 07/12/17 2155 07/13/17 0705  GLUCAP 171* 169*   Lipid Profile: No results for input(s): CHOL, HDL, LDLCALC, TRIG, CHOLHDL, LDLDIRECT in the last 72 hours. Thyroid Function Tests: No results for input(s): TSH, T4TOTAL, FREET4, T3FREE, THYROIDAB in the last 72 hours. Anemia Panel: No results for input(s): VITAMINB12, FOLATE, FERRITIN, TIBC, IRON, RETICCTPCT in the last 72 hours.    Radiology Studies: I have reviewed all of the imaging during this hospital visit personally     Scheduled Meds: . amLODipine  5 mg Oral Daily  . aspirin  650 mg Oral QPM  . enoxaparin (LOVENOX)  injection  40 mg Subcutaneous Q24H  . furosemide  20 mg Oral Daily  . glipiZIDE  10 mg Oral Q breakfast  . [START ON 07/14/2017] Influenza vac split quadrivalent PF  0.5 mL Intramuscular Tomorrow-1000  . insulin aspart  0-5 Units Subcutaneous QHS  . insulin aspart  0-9 Units Subcutaneous TID WC  . latanoprost  1 drop Both Eyes QHS  . linagliptin  5 mg Oral Daily  . lisinopril  20 mg Oral QAC breakfast  . metoprolol succinate  100 mg Oral QAC breakfast  . oxybutynin  5 mg Oral Daily  . pantoprazole  40 mg Oral Daily  . [START ON 07/14/2017] pneumococcal 23 valent vaccine  0.5 mL Intramuscular Tomorrow-1000  . pravastatin  40 mg Oral QHS   Continuous Infusions: . dextrose 5 % and 0.45 % NaCl with KCl 10 mEq/L 75 mL/hr at 07/12/17 2207     LOS: 1 day        Tawni Millers, MD [facility] Pager [phone]

## 2017-07-13 NOTE — Progress Notes (Addendum)
*  PRELIMINARY RESULTS* Vascular Ultrasound Left lower extremity venous duplex has been completed.  Preliminary findings: No evidence of deep vein thrombosis in the visualized veins of the left lower extremity.  Very difficult and limited exam due to patient body habitus and penetration.  Negative for baker's cyst on the left.   Everrett Coombe 07/13/2017, 9:33 AM

## 2017-07-14 DIAGNOSIS — Z6841 Body Mass Index (BMI) 40.0 and over, adult: Secondary | ICD-10-CM

## 2017-07-14 LAB — BASIC METABOLIC PANEL
Anion gap: 7 (ref 5–15)
BUN: 8 mg/dL (ref 6–20)
CHLORIDE: 104 mmol/L (ref 101–111)
CO2: 27 mmol/L (ref 22–32)
Calcium: 8.6 mg/dL — ABNORMAL LOW (ref 8.9–10.3)
Creatinine, Ser: 0.52 mg/dL (ref 0.44–1.00)
GFR calc Af Amer: >60 mL/min (ref >60)
GFR calc non Af Amer: >60 mL/min (ref >60)
GLUCOSE: 237 mg/dL — AB (ref 65–99)
POTASSIUM: 3.6 mmol/L (ref 3.5–5.1)
SODIUM: 138 mmol/L (ref 135–145)

## 2017-07-14 LAB — GLUCOSE, CAPILLARY
GLUCOSE-CAPILLARY: 333 mg/dL — AB (ref 65–99)
GLUCOSE-CAPILLARY: 227 mg/dL — AB (ref 65–99)
Glucose-Capillary: 249 mg/dL — ABNORMAL HIGH (ref 65–99)
Glucose-Capillary: 267 mg/dL — ABNORMAL HIGH (ref 65–99)

## 2017-07-14 LAB — HIV ANTIBODY (ROUTINE TESTING W REFLEX): HIV SCREEN 4TH GENERATION: NONREACTIVE

## 2017-07-14 MED ORDER — POTASSIUM CHLORIDE 20 MEQ PO PACK
40.0000 meq | PACK | Freq: Once | ORAL | Status: AC
  Administered 2017-07-14: 40 meq via ORAL
  Filled 2017-07-14: qty 2

## 2017-07-14 MED ORDER — INSULIN DETEMIR 100 UNIT/ML ~~LOC~~ SOLN
10.0000 [IU] | Freq: Every day | SUBCUTANEOUS | Status: DC
  Administered 2017-07-14 – 2017-07-15 (×2): 10 [IU] via SUBCUTANEOUS
  Filled 2017-07-14 (×2): qty 0.1

## 2017-07-14 NOTE — Progress Notes (Signed)
PROGRESS NOTE    ADDISSON FRATE  PPI:[id number] DOB: 20-May-1956 DOA: 07/12/2017 PCP: Darcus Austin, MD    Brief Narrative:  61 year old female who presented after sustaining a fall. She does have a significant past medical history for coronary artery disease with ischemic cardiomyopathy left ventricle ejection fraction 30-35%, type 2 diabetes mellitus and hypertension. Apparently she sustained a mechanical fall in her kitchen, no head trauma or syncope, after the fall she was not able to stand back on her feet. On the initial physical examination blood pressure 176/67, heart rate 73, temperature 98.4, respiratory rate 15, oxygen saturation 100%. Moist mucous membranes, her lungs were clear to auscultation bilaterally, no wheezes, rales or rhonchi, heart S1-S2 present and rhythmic, positive systolic murmur the abdomen was soft nontender, no lower extremity edema. Sodium 138, potassium 3.1, chloride 103, bicarbonate 24, glucose 208, BUN 5, creatinine 0.52, CPK 1452, white count 15, hemoglobin 9.8, hematocrit 37.2, platelets 236. Urine analysis native or infection. Chest x-ray with good penetration, no infiltrates, effusions or pneumothorax. Shoulder films with displaced comminuted fracture of the proximal right humeral neck. CT with mildly impacted and medially displaced surgical neck fracture of the right humerus extends into the greater tuberosity which is mildly comminuted and displaced. EKG sinus rhythm, normal intervals, normal axis, premature atrial complexes.   Patient admitted to the hospital with a working diagnosis of right humeral fracture, complicated by rhabdomyolysis.    Assessment & Plan:   Active Problems:   Humerus fracture   1. Right humeral fracture. Conservative not operative treatment, pain control with po hydrocodone-acetaminophen, tolerating well IV ketorolac. Physical therapy recommendation for SNF. Social services consult for discharge planing.   2. Rhabdomyolysis.  Continue renal function to be preserved, with serum cr at 0.52, will follow on renal panel in am, will hold on IV fluids, for now, patient tolerating po well.   3. Hypertension. Blood pressure systolic 063 to 016, continue with amlodipine, lisinopril and metoprolol.   4. Diabetes mellitus type 2.  Glucose cover and monitoring with iss, capillary glucose 236, 246, 249, 267. At home on glipizide and onglyza. Will start patient on basal insulin 10 units sq of levimir and continue glucose monitoring.   5. Hypokalemia. Serum K up to 3,6, will add 40 meq of kcl and will follow on renal panel in am,   DVT prophylaxis: enoxaparin  Code Status: full Family Communication:  Disposition Plan: Home or SNF   Consultants:   Orthopedics  Procedures:     Antimicrobials:      Subjective: Patient with persistent pain on the right shoulder, worse with movement, unable to get off the bed with no assistance, no nausea or vomiting, no chest pain or dyspnea.   Objective: Vitals:   07/12/17 2100 07/13/17 0628 07/13/17 2100 07/14/17 0403  BP: (!) 144/65 (!) 153/67 (!) 123/57 (!) 163/71  Pulse: 70 79 68 60  Resp: (!) 24     Temp: 99 F (37.2 C) 98.6 F (37 C) 98.1 F (36.7 C) 98.2 F (36.8 C)  TempSrc: Oral Oral Oral Oral  SpO2: 100% 93% 94% 100%    Intake/Output Summary (Last 24 hours) at 07/14/17 1252 Last data filed at 07/14/17 0957  Gross per 24 hour  Intake              360 ml  Output             1700 ml  Net            -1340  ml   There were no vitals filed for this visit.  Examination:   General: Not in pain or dyspnea, deconditioned Neurology: Awake and alert, non focal  E ENT: no pallor, no icterus, oral mucosa moist Cardiovascular: No JVD. S1-S2 present, rhythmic, no gallops, rubs, or murmurs. No lower extremity edema. Pulmonary: vesicular breath sounds bilaterally, adequate air movement, no wheezing, rhonchi or rales. Gastrointestinal. Abdomen flat, no  organomegaly, non tender, no rebound or guarding Skin. No rashes Musculoskeletal: right shoulder, tender to palpation, with decrease range of motion and local edema. No erythema.      Data Reviewed: I have personally reviewed following labs and imaging studies  CBC:  Recent Labs Lab 07/12/17 1525  WBC 15.9*  HGB 11.8*  HCT 37.2  MCV 82.7  PLT 384   Basic Metabolic Panel:  Recent Labs Lab 07/12/17 1525 07/13/17 0302 07/14/17 0535  NA 138 139 138  K 3.1* 3.2* 3.6  CL 103 106 104  CO2 24 23 27   GLUCOSE 208* 188* 237*  BUN 5* 5* 8  CREATININE 0.52 0.50 0.52  CALCIUM 9.0 8.8* 8.6*   GFR: CrCl cannot be calculated (Unknown ideal weight.). Liver Function Tests: No results for input(s): AST, ALT, ALKPHOS, BILITOT, PROT, ALBUMIN in the last 168 hours. No results for input(s): LIPASE, AMYLASE in the last 168 hours. No results for input(s): AMMONIA in the last 168 hours. Coagulation Profile: No results for input(s): INR, PROTIME in the last 168 hours. Cardiac Enzymes:  Recent Labs Lab 07/12/17 1525 07/13/17 0302  CKTOTAL 1,452* 677*   BNP (last 3 results) No results for input(s): PROBNP in the last 8760 hours. HbA1C:  Recent Labs  07/12/17 2114  HGBA1C 7.6*   CBG:  Recent Labs Lab 07/13/17 1131 07/13/17 1708 07/13/17 2111 07/14/17 0653 07/14/17 1151  GLUCAP 258* 236* 246* 249* 267*   Lipid Profile: No results for input(s): CHOL, HDL, LDLCALC, TRIG, CHOLHDL, LDLDIRECT in the last 72 hours. Thyroid Function Tests: No results for input(s): TSH, T4TOTAL, FREET4, T3FREE, THYROIDAB in the last 72 hours. Anemia Panel: No results for input(s): VITAMINB12, FOLATE, FERRITIN, TIBC, IRON, RETICCTPCT in the last 72 hours.    Radiology Studies: I have reviewed all of the imaging during this hospital visit personally     Scheduled Meds: . amLODipine  5 mg Oral Daily  . aspirin  650 mg Oral QPM  . enoxaparin (LOVENOX) injection  40 mg Subcutaneous Q24H    . Influenza vac split quadrivalent PF  0.5 mL Intramuscular Tomorrow-1000  . insulin aspart  0-5 Units Subcutaneous QHS  . insulin aspart  0-9 Units Subcutaneous TID WC  . latanoprost  1 drop Both Eyes QHS  . lisinopril  20 mg Oral QAC breakfast  . metoprolol succinate  100 mg Oral QAC breakfast  . oxybutynin  5 mg Oral Daily  . pantoprazole  40 mg Oral Daily  . pneumococcal 23 valent vaccine  0.5 mL Intramuscular Tomorrow-1000  . pravastatin  40 mg Oral QHS   Continuous Infusions: . sodium chloride 50 mL/hr at 07/13/17 1103     LOS: 2 days        Haillie Radu Gerome Apley, MD [facility] Pager [phone]

## 2017-07-14 NOTE — Evaluation (Signed)
Physical Therapy Evaluation Patient Details Name: Whitney [name] MRN: [id number] DOB: 15-Jan-1956 Today's Date: 07/14/2017   History of Present Illness  61 y.o. female, With past medical history significant for coronary artery disease status post MI and cardiac arrest in the past with ejection fraction 30-35% by last echo 2013, history of diabetes mellitus hypertension who was transferred to our facility to be evaluated after mechanical fall in her kitchen.  Pt was on the floor for an extended period of time before being found by family. Xray revealed R humeral fx.  Pt admitted for nonsurgical management.     Clinical Impression  Pt admitted with above diagnosis. Pt currently with functional limitations due to the deficits listed below (see PT Problem List). On eval, pt required +2 max/total assist for bed mobility. Pt unable to power up from EOB for transfers. Pt very motivated to participate and gave 100% effort during session. She was very tearful at her inability to stand. Her body habitus along with NWB RUE and painful RUE make it difficult to adequately assist her safely with standing and transfers.  Pt stands with a very wide BOS making durable lift equipment difficult as well. Pt will benefit from skilled PT to increase their independence and safety with mobility to allow discharge to the venue listed below.       Follow Up Recommendations SNF    Equipment Recommendations  None recommended by PT    Recommendations for Other Services OT consult     Precautions / Restrictions Precautions Precautions: Fall Required Braces or Orthoses: Sling Restrictions RUE Weight Bearing: Non weight bearing      Mobility  Bed Mobility Overal bed mobility: Needs Assistance Bed Mobility: Supine to Sit;Sit to Supine     Supine to sit: +2 for physical assistance;Max assist Sit to supine: +2 for physical assistance;Total assist   General bed mobility comments: use of bed pads to scoot EOB and  scoot up in bed at end of session  Transfers Overall transfer level: Needs assistance               General transfer comment: Attempted sit to stand from EOB without success. Pt unable to power up. She reports her R knee frequently 'gives way.'  Attempted use of bariatric stedy for sit to stand. Pt again unable to power up. She began sliding too close to the EOB, requiring +2 total assist for return to supine in bed.   Ambulation/Gait             General Gait Details: unable  Stairs            Wheelchair Mobility    Modified Rankin (Stroke Patients Only)       Balance Overall balance assessment: Needs assistance Sitting-balance support: No upper extremity supported;Feet supported Sitting balance-Leahy Scale: Good                                       Pertinent Vitals/Pain Pain Assessment: Faces Faces Pain Scale: Hurts even more Pain Location: RUE during mobility Pain Descriptors / Indicators: Grimacing;Guarding Pain Intervention(s): Limited activity within patient's tolerance;Monitored during session;Repositioned    Home Living Family/patient expects to be discharged to:: Skilled nursing facility   Available Help at Discharge: Friend(s);Available PRN/intermittently;Family Type of Home: House Home Access: Ramped entrance     Home Layout: One level Home Equipment: [place] - 2 wheels;Cane - single point;Tub bench  Prior Function Level of Independence: Independent with assistive device(s)         Comments: Pt ambulated with RW in the house and cane outside the house. She drove and did her own grocery shopping.     Hand Dominance   Dominant Hand: Right    Extremity/Trunk Assessment   Upper Extremity Assessment Upper Extremity Assessment: Defer to OT evaluation    Lower Extremity Assessment Lower Extremity Assessment: Generalized weakness    Cervical / Trunk Assessment Cervical / Trunk Assessment: Normal   Communication   Communication: No difficulties  Cognition Arousal/Alertness: Awake/alert Behavior During Therapy: WFL for tasks assessed/performed Overall Cognitive Status: Within Functional Limits for tasks assessed                                        General Comments      Exercises     Assessment/Plan    PT Assessment Patient needs continued PT services  PT Problem List Decreased strength;Decreased activity tolerance;Decreased mobility;Decreased balance;Pain;Decreased skin integrity;Decreased knowledge of precautions       PT Treatment Interventions DME instruction;Therapeutic activities;Gait training;Therapeutic exercise;Balance training;Functional mobility training;Patient/family education    PT Goals (Current goals can be found in the Care Plan section)  Acute Rehab PT Goals Patient Stated Goal: independence PT Goal Formulation: With patient Time For Goal Achievement: 07/28/17 Potential to Achieve Goals: Good    Frequency Min 3X/week   Barriers to discharge Decreased caregiver support      Co-evaluation               AM-PAC PT "6 Clicks" Daily Activity  Outcome Measure Difficulty turning over in bed (including adjusting bedclothes, sheets and blankets)?: Unable Difficulty moving from lying on back to sitting on the side of the bed? : Unable Difficulty sitting down on and standing up from a chair with arms (e.g., wheelchair, bedside commode, etc,.)?: Unable Help needed moving to and from a bed to chair (including a wheelchair)?: Total Help needed walking in hospital room?: Total Help needed climbing 3-5 steps with a railing? : Total 6 Click Score: 6    End of Session Equipment Utilized During Treatment: Gait belt;Other (comment) (R sling) Activity Tolerance: Patient tolerated treatment well Patient left: in bed;with call bell/phone within reach Nurse Communication: Mobility status PT Visit Diagnosis: Other abnormalities of gait and  mobility (R26.89);Pain;Muscle weakness (generalized) (M62.81) Pain - Right/Left: Right Pain - part of body: Arm    Time: 4562-5638 PT Time Calculation (min) (ACUTE ONLY): 32 min   Charges:   PT Evaluation $PT Eval Moderate Complexity: 1 Mod PT Treatments $Therapeutic Activity: 8-22 mins   PT G Codes:        Lorrin Goodell, PT  Office # [phone] Pager [phone]   Lorriane Shire 07/14/2017, 2:30 PM

## 2017-07-15 DIAGNOSIS — S42251D Displaced fracture of greater tuberosity of right humerus, subsequent encounter for fracture with routine healing: Secondary | ICD-10-CM | POA: Diagnosis not present

## 2017-07-15 DIAGNOSIS — X370XXA Hurricane, initial encounter: Secondary | ICD-10-CM | POA: Diagnosis not present

## 2017-07-15 DIAGNOSIS — M25551 Pain in right hip: Secondary | ICD-10-CM | POA: Diagnosis not present

## 2017-07-15 DIAGNOSIS — E119 Type 2 diabetes mellitus without complications: Secondary | ICD-10-CM | POA: Diagnosis not present

## 2017-07-15 DIAGNOSIS — G8911 Acute pain due to trauma: Secondary | ICD-10-CM | POA: Diagnosis not present

## 2017-07-15 DIAGNOSIS — I469 Cardiac arrest, cause unspecified: Secondary | ICD-10-CM | POA: Diagnosis not present

## 2017-07-15 DIAGNOSIS — Z23 Encounter for immunization: Secondary | ICD-10-CM | POA: Diagnosis not present

## 2017-07-15 DIAGNOSIS — R600 Localized edema: Secondary | ICD-10-CM | POA: Diagnosis not present

## 2017-07-15 DIAGNOSIS — S42309A Unspecified fracture of shaft of humerus, unspecified arm, initial encounter for closed fracture: Secondary | ICD-10-CM | POA: Diagnosis not present

## 2017-07-15 DIAGNOSIS — M5416 Radiculopathy, lumbar region: Secondary | ICD-10-CM | POA: Diagnosis not present

## 2017-07-15 DIAGNOSIS — S42214D Unspecified nondisplaced fracture of surgical neck of right humerus, subsequent encounter for fracture with routine healing: Secondary | ICD-10-CM | POA: Diagnosis not present

## 2017-07-15 DIAGNOSIS — Z8674 Personal history of sudden cardiac arrest: Secondary | ICD-10-CM | POA: Diagnosis not present

## 2017-07-15 DIAGNOSIS — R262 Difficulty in walking, not elsewhere classified: Secondary | ICD-10-CM | POA: Diagnosis not present

## 2017-07-15 DIAGNOSIS — E876 Hypokalemia: Secondary | ICD-10-CM | POA: Diagnosis not present

## 2017-07-15 DIAGNOSIS — Y92000 Kitchen of unspecified non-institutional (private) residence as  the place of occurrence of the external cause: Secondary | ICD-10-CM | POA: Diagnosis not present

## 2017-07-15 DIAGNOSIS — Z7984 Long term (current) use of oral hypoglycemic drugs: Secondary | ICD-10-CM | POA: Diagnosis not present

## 2017-07-15 DIAGNOSIS — R52 Pain, unspecified: Secondary | ICD-10-CM | POA: Diagnosis not present

## 2017-07-15 DIAGNOSIS — S42211A Unspecified displaced fracture of surgical neck of right humerus, initial encounter for closed fracture: Secondary | ICD-10-CM | POA: Diagnosis not present

## 2017-07-15 DIAGNOSIS — E1165 Type 2 diabetes mellitus with hyperglycemia: Secondary | ICD-10-CM | POA: Diagnosis not present

## 2017-07-15 DIAGNOSIS — Z79899 Other long term (current) drug therapy: Secondary | ICD-10-CM | POA: Diagnosis not present

## 2017-07-15 DIAGNOSIS — E785 Hyperlipidemia, unspecified: Secondary | ICD-10-CM | POA: Diagnosis not present

## 2017-07-15 DIAGNOSIS — R748 Abnormal levels of other serum enzymes: Secondary | ICD-10-CM | POA: Diagnosis not present

## 2017-07-15 DIAGNOSIS — M170 Bilateral primary osteoarthritis of knee: Secondary | ICD-10-CM | POA: Diagnosis not present

## 2017-07-15 DIAGNOSIS — S299XXA Unspecified injury of thorax, initial encounter: Secondary | ICD-10-CM | POA: Diagnosis not present

## 2017-07-15 DIAGNOSIS — Z9189 Other specified personal risk factors, not elsewhere classified: Secondary | ICD-10-CM | POA: Diagnosis not present

## 2017-07-15 DIAGNOSIS — G4733 Obstructive sleep apnea (adult) (pediatric): Secondary | ICD-10-CM | POA: Diagnosis not present

## 2017-07-15 DIAGNOSIS — R350 Frequency of micturition: Secondary | ICD-10-CM | POA: Diagnosis not present

## 2017-07-15 DIAGNOSIS — I2510 Atherosclerotic heart disease of native coronary artery without angina pectoris: Secondary | ICD-10-CM | POA: Diagnosis not present

## 2017-07-15 DIAGNOSIS — R488 Other symbolic dysfunctions: Secondary | ICD-10-CM | POA: Diagnosis not present

## 2017-07-15 DIAGNOSIS — W1830XA Fall on same level, unspecified, initial encounter: Secondary | ICD-10-CM | POA: Diagnosis not present

## 2017-07-15 DIAGNOSIS — I255 Ischemic cardiomyopathy: Secondary | ICD-10-CM | POA: Diagnosis not present

## 2017-07-15 DIAGNOSIS — S79919A Unspecified injury of unspecified hip, initial encounter: Secondary | ICD-10-CM | POA: Diagnosis not present

## 2017-07-15 DIAGNOSIS — I5022 Chronic systolic (congestive) heart failure: Secondary | ICD-10-CM | POA: Diagnosis not present

## 2017-07-15 DIAGNOSIS — Z7982 Long term (current) use of aspirin: Secondary | ICD-10-CM | POA: Diagnosis not present

## 2017-07-15 DIAGNOSIS — Z9181 History of falling: Secondary | ICD-10-CM | POA: Diagnosis not present

## 2017-07-15 DIAGNOSIS — M5431 Sciatica, right side: Secondary | ICD-10-CM | POA: Diagnosis not present

## 2017-07-15 DIAGNOSIS — I10 Essential (primary) hypertension: Secondary | ICD-10-CM | POA: Diagnosis not present

## 2017-07-15 DIAGNOSIS — R1311 Dysphagia, oral phase: Secondary | ICD-10-CM | POA: Diagnosis not present

## 2017-07-15 DIAGNOSIS — G629 Polyneuropathy, unspecified: Secondary | ICD-10-CM | POA: Diagnosis not present

## 2017-07-15 DIAGNOSIS — Z7902 Long term (current) use of antithrombotics/antiplatelets: Secondary | ICD-10-CM | POA: Diagnosis not present

## 2017-07-15 DIAGNOSIS — S79911A Unspecified injury of right hip, initial encounter: Secondary | ICD-10-CM | POA: Diagnosis not present

## 2017-07-15 DIAGNOSIS — I110 Hypertensive heart disease with heart failure: Secondary | ICD-10-CM | POA: Diagnosis not present

## 2017-07-15 DIAGNOSIS — S42211D Unspecified displaced fracture of surgical neck of right humerus, subsequent encounter for fracture with routine healing: Secondary | ICD-10-CM | POA: Diagnosis not present

## 2017-07-15 DIAGNOSIS — M6281 Muscle weakness (generalized): Secondary | ICD-10-CM | POA: Diagnosis not present

## 2017-07-15 LAB — BASIC METABOLIC PANEL
Anion gap: 7 (ref 5–15)
BUN: 8 mg/dL (ref 6–20)
CO2: 28 mmol/L (ref 22–32)
CREATININE: 0.5 mg/dL (ref 0.44–1.00)
Calcium: 8.5 mg/dL — ABNORMAL LOW (ref 8.9–10.3)
Chloride: 104 mmol/L (ref 101–111)
Glucose, Bld: 212 mg/dL — ABNORMAL HIGH (ref 65–99)
POTASSIUM: 3.6 mmol/L (ref 3.5–5.1)
SODIUM: 139 mmol/L (ref 135–145)

## 2017-07-15 LAB — GLUCOSE, CAPILLARY
GLUCOSE-CAPILLARY: 305 mg/dL — AB (ref 65–99)
Glucose-Capillary: 180 mg/dL — ABNORMAL HIGH (ref 65–99)
Glucose-Capillary: 281 mg/dL — ABNORMAL HIGH (ref 65–99)

## 2017-07-15 MED ORDER — IBUPROFEN 400 MG PO TABS: 400.0000 mg | ORAL_TABLET | Freq: Three times a day (TID) | ORAL | 0 refills | Status: AC | PRN

## 2017-07-15 MED ORDER — TRAMADOL HCL 50 MG PO TABS: 100.0000 mg | ORAL_TABLET | Freq: Two times a day (BID) | ORAL | 0 refills | Status: DC | PRN

## 2017-07-15 MED ORDER — POTASSIUM CHLORIDE ER 20 MEQ PO TBCR: 10.0000 meq | EXTENDED_RELEASE_TABLET | Freq: Every day | ORAL | 0 refills | Status: DC

## 2017-07-15 MED ORDER — HYDROCODONE-ACETAMINOPHEN 5-325 MG PO TABS: 1.0000 | ORAL_TABLET | ORAL | 0 refills | Status: DC | PRN

## 2017-07-15 MED ORDER — CYCLOBENZAPRINE HCL 5 MG PO TABS: 5.0000 mg | ORAL_TABLET | Freq: Three times a day (TID) | ORAL | 0 refills | Status: DC | PRN

## 2017-07-15 MED ORDER — ACETAMINOPHEN 325 MG PO TABS: 650.0000 mg | ORAL_TABLET | Freq: Four times a day (QID) | ORAL | 0 refills | Status: DC | PRN

## 2017-07-15 MED ORDER — ASPIRIN 325 MG PO TABS: 325.0000 mg | ORAL_TABLET | Freq: Every day | ORAL | 0 refills | Status: DC

## 2017-07-15 NOTE — Progress Notes (Signed)
Patient's discharge instructions gone over with patient. Packet and prescriptions are placed in drawer for [facility] to take to facility. Report called to [facility].

## 2017-07-15 NOTE — NC FL2 (Signed)
[place] LEVEL OF CARE SCREENING TOOL     IDENTIFICATION  Patient Name: Whitney [name] Birthdate: 01-26-1956 Sex: female Admission Date (Current Location): 07/12/2017  [facility] and [place] Number:  Herbalist and Address:  [facility] [place] [address] [place], [place] [address]      Provider Number: [id number]  Attending Physician Name and Address:  Tawni Millers  Relative Name and Phone Number:       Current Level of Care: SNF Recommended Level of Care: [place] Prior Approval Number:    Date Approved/Denied: 07/15/17 PASRR Number: [id number]  Discharge Plan: SNF    Current Diagnoses: Patient Active Problem List   Diagnosis Date Noted  . Humerus fracture 07/12/2017  . Morbid obesity due to excess calories ([place]) 12/14/2015  . Chronic systolic heart failure ([place]) 12/14/2015  . Chest pain 12/14/2015  . Angina decubitus ([place]) 12/14/2015  . Adiposity 01/06/2013  . Arthritis of knee, degenerative 01/06/2013  . OSA (obstructive sleep apnea) 11/30/2011  . Cardiac arrest ([place]) 11/13/2011  . Diabetes mellitus type 2, uncontrolled ([place]) 11/13/2011    Orientation RESPIRATION BLADDER Height & Weight     Self, Time, Situation, Place  Normal Indwelling catheter Weight:   Height:     BEHAVIORAL SYMPTOMS/MOOD NEUROLOGICAL BOWEL NUTRITION STATUS      Continent Diet (See DC summary)  AMBULATORY STATUS COMMUNICATION OF NEEDS Skin   Extensive Assist Verbally Bruising (Right Humerus Fracture)                       Personal Care Assistance Level of Assistance  Dressing, Bathing Bathing Assistance: Limited assistance   Dressing Assistance: Limited assistance     Functional Limitations Info             SPECIAL CARE FACTORS FREQUENCY  PT (By licensed PT)     PT Frequency: 3x week              Contractures Contractures Info: Not present    Additional Factors Info  Code Status,  Allergies, Insulin Sliding Scale Code Status Info: Full code Allergies Info: CONTRAST MEDIA IODINATED DIAGNOSTIC AGENTS    Insulin Sliding Scale Info: Insulin Daily       Current Medications (07/15/2017):  This is the current hospital active medication list Current Facility-Administered Medications  Medication Dose Route Frequency Provider Last Rate Last Dose  . acetaminophen (TYLENOL) tablet 650 mg  650 mg Oral Q6H PRN Merton Border, MD   650 mg at 07/12/17 2131   Or  . acetaminophen (TYLENOL) suppository 650 mg  650 mg Rectal Q6H PRN Merton Border, MD      . albuterol (PROVENTIL) (2.5 MG/3ML) 0.083% nebulizer solution 2.5 mg  2.5 mg Nebulization Q2H PRN Merton Border, MD      . amLODipine (NORVASC) tablet 5 mg  5 mg Oral Daily Merton Border, MD   5 mg at 07/15/17 0943  . aspirin tablet 650 mg  650 mg Oral QPM Merton Border, MD   650 mg at 07/14/17 1707  . cyclobenzaprine (FLEXERIL) tablet 5 mg  5 mg Oral BID PRN Merton Border, MD      . enoxaparin (LOVENOX) injection 40 mg  40 mg Subcutaneous Q24H Merton Border, MD   40 mg at 07/14/17 2210  . HYDROcodone-acetaminophen (NORCO/VICODIN) 5-325 MG per tablet 1-2 tablet  1-2 tablet Oral Q4H PRN Merton Border, MD   2 tablet at 07/15/17 0620  . insulin  aspart (novoLOG) injection 0-5 Units  0-5 Units Subcutaneous QHS Merton Border, MD   2 Units at 07/14/17 2206  . insulin aspart (novoLOG) injection 0-9 Units  0-9 Units Subcutaneous TID WC Merton Border, MD   2 Units at 07/15/17 [phone]  . insulin detemir (LEVEMIR) injection 10 Units  10 Units Subcutaneous Daily Tawni Millers, MD   10 Units at 07/14/17 1707  . ketorolac (TORADOL) 15 MG/ML injection 15 mg  15 mg Intravenous Q6H PRN Arrien, Jimmy Picket, MD   15 mg at 07/14/17 1637  . latanoprost (XALATAN) 0.005 % ophthalmic solution 1 drop  1 drop Both Eyes QHS Merton Border, MD   1 drop at 07/14/17 2206  . lisinopril (PRINIVIL,ZESTRIL) tablet 20 mg  20 mg Oral QAC breakfast Merton Border, MD   20 mg at 07/15/17  0943  . magnesium hydroxide (MILK OF MAGNESIA) suspension 15 mL  15 mL Oral Daily PRN Merton Border, MD      . metoprolol succinate (TOPROL-XL) 24 hr tablet 100 mg  100 mg Oral QAC breakfast Merton Border, MD   100 mg at 07/15/17 0943  . ondansetron (ZOFRAN) tablet 4 mg  4 mg Oral Q6H PRN Merton Border, MD       Or  . ondansetron (ZOFRAN) injection 4 mg  4 mg Intravenous Q6H PRN Merton Border, MD      . oxybutynin (DITROPAN-XL) 24 hr tablet 5 mg  5 mg Oral Daily Merton Border, MD   5 mg at 07/15/17 0943  . pantoprazole (PROTONIX) EC tablet 40 mg  40 mg Oral Daily Merton Border, MD   40 mg at 07/15/17 0943  . pravastatin (PRAVACHOL) tablet 40 mg  40 mg Oral QHS Merton Border, MD   40 mg at 07/14/17 2047  . zolpidem (AMBIEN) tablet 5 mg  5 mg Oral QHS PRN Merton Border, MD         Discharge Medications: Please see discharge summary for a list of discharge medications.  Relevant Imaging Results:  Relevant Lab Results:   Additional Information SS#[id number]  Normajean Baxter, LCSW

## 2017-07-15 NOTE — Discharge Summary (Signed)
Physician Discharge Summary  Whitney [name] IOE:[id number] DOB: 1956-01-17 DOA: 07/12/2017  PCP: Darcus Austin, MD  Admit date: 07/12/2017 Discharge date: 07/15/2017  Admitted From: Home Disposition:  snf  Recommendations for Outpatient Follow-up:  1. Follow up with PCP in 1- weeks 2. Advise sling immobilization (shoulder immobilizer) for at least 3 weeks with repeat radiographs in a week to assess fracture displacement and/or consolidation.  3. Follow up with Dr. Alphonzo Severance in 7 days.   Home Health: Na  Equipment/Devices: Na   Discharge Condition: Stable  CODE STATUS: Full  Diet recommendation: Heart healthy and diabetic prudent 1800 cal.  Brief/Interim Summary: 61 year old female who presented after sustaining a fall. She does have a significant past medical history for coronary artery disease with ischemic cardiomyopathy left ventricle ejection fraction 30-35%, type 2 diabetes mellitus and hypertension. Apparently she sustained a mechanical fall in her kitchen, no head trauma or syncope, after the fall she was not able to stand back on her feet. On the initial physical examination blood pressure 176/67, heart rate 73, temperature 98.4, respiratory rate 15, oxygen saturation 100%. Moist mucous membranes, her lungs were clear to auscultation bilaterally, no wheezes, rales or rhonchi, heart S1-S2 present and rhythmic, positive systolic murmur, the abdomen was soft nontender, no lower extremity edema. Sodium 138, potassium 3.1, chloride 103, bicarbonate 24, glucose 208, BUN 5, creatinine 0.52, CPK 1452, white count 15, hemoglobin 9.8, hematocrit 37.2, platelets 236. Urine analysis native or infection. Chest x-ray with good penetration, no infiltrates, effusions or pneumothorax. Shoulder films with displaced comminuted fracture of the proximal right humeral neck. CT with mildly impacted and medially displaced surgical neck fracture of the right humerus extends into the greater tuberosity which is  mildly comminuted and displaced. EKG sinus rhythm, normal intervals, normal axis, premature atrial complexes.   Patient admitted to the hospital with a working diagnosis of right humeral fracture, complicated by rhabdomyolysis.   1. Right humerus mildly impacted and medial displaced surgical neck fracture, extending into the greater tuberosity. Patient was admitted to the medical ward, she was placed on IV analgesics and DVT prophylaxis. Patient was seen by the surgical orthopedic service, recommendations for conservative medical therapy, advised sling immobilization for at least 3 weeks with repeat radiographs in a week to assess fracture displacement and or consolidation. Follow-up with Dr. Marlou Sa in 7 days. Pain control was achieved with acetaminophen-hydrocodene and ketorolac. Patient will be discharged on as needed ibuprofen, acetaminophen and hydrocodone plus Flexeril for muscle relaxant. Patient was seen by physical therapy with recommendations to continue rehabilitation at the skilled nursing facility  2. Impending rhabdomyolysis. Initial CPK 1452, patient received intravenous crystalloid solutions, follow CPK down to 677, the kidney function remained preserved with a serum creatinine 0.50. Patient was taken off IV fluids, no signs of volume overload.  3. Hypertension.  Patient was continued on amlodipine, lisinopril and metoprolol with good blood pressure control.  4. Type 2 diabetes mellitus. Patient was placed on insulin sliding scale for glucose coverage and monitoring, capillary glucose remained above 200, she received 10 units (levimir) daily of basal insulin while in the hospital. At discharge she will resume oral hypoglycemic agents, glipizide, Onglyza  5. Hypokalemia. Potassium was repleted with potassium chloride orally with no major complications. Discharge potassium 3.6  6. Systolic heart failure. Chronic and stable, no signs of exacerbation, patient tolerated well IV fluids, she  will resume furosemide at discharge. Will add potassium supplements to prevent hypokalemia. Continue lisinopril and metoprolol. Close follow-up kidney function and  electrolytes.  7. Degenerative joint disease/ with osteoarthritis of the knees. Continue tramadol and physical therapy.  Discharge Diagnoses:  Active Problems:   Humerus fracture    Discharge Instructions   Allergies as of 07/15/2017      Reactions   Contrast Media [iodinated Diagnostic Agents] Anaphylaxis      Medication List    STOP taking these medications   UNABLE TO FIND     TAKE these medications   acetaminophen 325 MG tablet Commonly known as:  TYLENOL Take 2 tablets (650 mg total) by mouth every 6 (six) hours as needed for mild pain (or Fever >/= 101).   amLODipine 5 MG tablet Commonly known as:  NORVASC Take 5 mg by mouth daily.   aspirin 325 MG tablet Take 1 tablet (325 mg total) by mouth at bedtime. What changed:  how much to take   clobetasol ointment 0.05 % Commonly known as:  TEMOVATE Apply 1 application topically 2 (two) times daily as needed (itching).   cyclobenzaprine 5 MG tablet Commonly known as:  FLEXERIL Take 1 tablet (5 mg total) by mouth 3 (three) times daily as needed for muscle spasms. What changed:  medication strength  when to take this   furosemide 20 MG tablet Commonly known as:  LASIX Take 1 tablet (20 mg total) by mouth daily. What changed:  when to take this  additional instructions   glipiZIDE 10 MG 24 hr tablet Commonly known as:  GLUCOTROL XL Take 10 mg by mouth daily.   HYDROcodone-acetaminophen 5-325 MG tablet Commonly known as:  NORCO/VICODIN Take 1 tablet by mouth every 4 (four) hours as needed for severe pain.   ibuprofen 400 MG tablet Commonly known as:  ADVIL,MOTRIN Take 1 tablet (400 mg total) by mouth every 8 (eight) hours as needed for moderate pain.   lisinopril 20 MG tablet Commonly known as:  PRINIVIL,ZESTRIL Take 20 mg by mouth daily  before breakfast.   LUMIGAN 0.01 % Soln Generic drug:  bimatoprost Place 1 drop into both eyes at bedtime.   magnesium hydroxide 400 MG/5ML suspension Commonly known as:  MILK OF MAGNESIA Take 15 mLs by mouth daily as needed for mild constipation.   metoprolol succinate 100 MG 24 hr tablet Commonly known as:  TOPROL-XL Take 100 mg by mouth daily before breakfast. Take with or immediately following a meal.   ONGLYZA 5 MG Tabs tablet Generic drug:  saxagliptin HCl Take 5 mg by mouth daily.   oxybutynin 5 MG tablet Commonly known as:  DITROPAN Take 5 mg by mouth daily.   pantoprazole 40 MG tablet Commonly known as:  PROTONIX Take 40 mg by mouth daily.   Potassium Chloride ER 20 MEQ Tbcr Take 10 mEq by mouth daily.   pravastatin 40 MG tablet Commonly known as:  PRAVACHOL Take 40 mg by mouth at bedtime.   traMADol 50 MG tablet Commonly known as:  ULTRAM Take 2 tablets (100 mg total) by mouth 2 (two) times daily as needed for moderate pain or severe pain.            Durable Medical Equipment        Start     Ordered   07/12/17 2028  For home use only DME continuous positive airway pressure (CPAP)  Once    Question Answer Comment  Patient has OSA or probable OSA Yes   Settings 5-10   CPAP supplies needed Mask, headgear, cushions, filters, heated tubing and water chamber  07/12/17 2027     Follow-up Information    Marlou Sa Tonna Corner, MD Follow up in 1 week(s).   Specialty:  Orthopedic Surgery Contact information: [address] [place] [address] [phone]          Allergies  Allergen Reactions  . Contrast Media [Iodinated Diagnostic Agents] Anaphylaxis    Consultations:  Orthopedics   Procedures/Studies: Dg Chest 1 View  Result Date: 07/12/2017 CLINICAL DATA:  [place] onto right shoulder. EXAM: CHEST 1 VIEW COMPARISON:  01/04/2013 FINDINGS: Lungs are clear. Heart is mildly enlarged but may be accentuated by the AP supine  technique. Negative for a pneumothorax. Bridging osteophytes along the right side of thoracic spine. Fracture involving the proximal right humerus. Linear density along the mid left clavicle could be related to overlying scapula but this area is incompletely evaluated. IMPRESSION: No acute cardiopulmonary disease. Fracture of the proximal right humerus. Incomplete evaluation of the left clavicle. If there is concern for an injury in this area, recommend dedicated images. Electronically Signed   By: Markus Daft M.D.   On: 07/12/2017 17:28   Dg Shoulder Right  Result Date: 07/12/2017 CLINICAL DATA:  Fall.  Right shoulder pain. EXAM: RIGHT SHOULDER - 2+ VIEW COMPARISON:  No recent prior. FINDINGS: Displaced comminuted fracture of the proximal right humeral neck noted. No evidence of dislocation . Acromioclavicular degenerative change present. IMPRESSION: Displaced comminuted fracture of the proximal right humeral neck. Electronically Signed   By: Marcello Moores  Register   On: 07/12/2017 17:26   Dg Tibia/fibula Left  Result Date: 07/12/2017 CLINICAL DATA:  Left leg pain post fall. EXAM: LEFT TIBIA AND FIBULA - 2 VIEW COMPARISON:  None. FINDINGS: There is no evidence of fracture. Suboptimal visualization of the left knee severe osteoarthritic changes with joint space loss, subchondral sclerosis, remodeling and cyst formation an osteophyte formation. Soft tissues are unremarkable. IMPRESSION: No definite evidence of fracture. Suboptimal visualization of the left knee, which demonstrates 3 compartment severe osteoarthritic changes. Electronically Signed   By: Fidela Salisbury M.D.   On: 07/12/2017 17:28   Ct Shoulder Right Wo Contrast  Result Date: 07/12/2017 CLINICAL DATA:  Right humerus fracture due to a fall today. Initial encounter. EXAM: CT OF THE UPPER RIGHT EXTREMITY WITHOUT CONTRAST TECHNIQUE: Multidetector CT imaging of the upper right extremity was performed according to the standard protocol.  COMPARISON:  Plain films right shoulder earlier today. FINDINGS: Bones/Joint/Cartilage As seen on the comparison plain films, the patient has a mildly impacted surgical neck fracture of the right humerus. The fracture shows slight medial displacement. The lesser tuberosity is spared. The fracture involves the greater tuberosity which is mildly comminuted and posteriorly and inferiorly displaced. The humeral head is located and the acromioclavicular joint is intact. No other fracture is identified. No lytic or sclerotic bony lesion. Ligaments Suboptimally assessed by CT. Muscles and Tendons The rotator cuff appears intact. Soft tissues Imaged lung parenchyma is clear. IMPRESSION: Mildly impacted and medially displaced surgical neck fracture of the right humerus extends into the greater tuberosity which is mildly comminuted and displaced. Acromioclavicular osteoarthritis. Electronically Signed   By: Inge Rise M.D.   On: 07/12/2017 18:52   Dg Femur Min 2 Views Left  Result Date: 07/12/2017 CLINICAL DATA:  Fall.  Pain left lower extremity. EXAM: LEFT FEMUR 2 VIEWS COMPARISON:  No recent prior. FINDINGS: Left femur is intact. Left hip is intact. Severe degenerative changes left knee. Deformity noted the medial tibial plateau, this may be from degenerative change. Subtle fracture  cannot be excluded . IMPRESSION: 1.  No acute left femoral abnormality identified. 2. Severe degenerative changes left knee.Deformity noted of the medial tibial plateau. This may be from degenerative change. Subtle fracture cannot be excluded. If need be further evaluation with MRI can be obtained. Electronically Signed   By: Marcello Moores  Register   On: 07/12/2017 17:29       Subjective: Patient feeling better, pain controlled at the right shoulder but worse with movement and affecting her mobility. Positive chronic bilateral knee pain.   Discharge Exam: Vitals:   07/14/17 1900 07/15/17 0500  BP: (!) 152/59 (!) 188/84  Pulse:  70 75  Resp: 16 16  Temp: 98.4 F (36.9 C) 97.9 F (36.6 C)  SpO2: 99% 99%   Vitals:   07/14/17 0403 07/14/17 1448 07/14/17 1900 07/15/17 0500  BP: (!) 163/71 137/71 (!) 152/59 (!) 188/84  Pulse: 60 82 70 75  Resp:  16 16 16   Temp: 98.2 F (36.8 C) 98.2 F (36.8 C) 98.4 F (36.9 C) 97.9 F (36.6 C)  TempSrc: Oral Oral Oral Oral  SpO2: 100% 98% 99% 99%    General: Pt is alert, awake, not in acute distress E ENT: mild pallor, no icterus, oral mucosa moist.  Cardiovascular: RRR, S1/S2 +, no rubs, no gallops Respiratory: CTA bilaterally, no wheezing, no rhonchi Abdominal: Soft, NT, ND, bowel sounds + Extremities: no edema, no cyanosis. Right shoulder with non pitting edema. Decrease range of motion due to pain. No erythema.     The results of significant diagnostics from this hospitalization (including imaging, microbiology, ancillary and laboratory) are listed below for reference.     Microbiology: No results found for this or any previous visit (from the past 240 hour(s)).   Labs: BNP (last 3 results) No results for input(s): BNP in the last 8760 hours. Basic Metabolic Panel:  Recent Labs Lab 07/12/17 1525 07/13/17 0302 07/14/17 0535 07/15/17 0335  NA 138 139 138 139  K 3.1* 3.2* 3.6 3.6  CL 103 106 104 104  CO2 24 23 27 28   GLUCOSE 208* 188* 237* 212*  BUN 5* 5* 8 8  CREATININE 0.52 0.50 0.52 0.50  CALCIUM 9.0 8.8* 8.6* 8.5*   Liver Function Tests: No results for input(s): AST, ALT, ALKPHOS, BILITOT, PROT, ALBUMIN in the last 168 hours. No results for input(s): LIPASE, AMYLASE in the last 168 hours. No results for input(s): AMMONIA in the last 168 hours. CBC:  Recent Labs Lab 07/12/17 1525  WBC 15.9*  HGB 11.8*  HCT 37.2  MCV 82.7  PLT 236   Cardiac Enzymes:  Recent Labs Lab 07/12/17 1525 07/13/17 0302  CKTOTAL 1,452* 677*   BNP: Invalid input(s): POCBNP CBG:  Recent Labs Lab 07/14/17 0653 07/14/17 1151 07/14/17 1636 07/14/17 2058  07/15/17 0624  GLUCAP 249* 267* 333* 227* 180*   D-Dimer No results for input(s): DDIMER in the last 72 hours. Hgb A1c  Recent Labs  07/12/17 2114  HGBA1C 7.6*   Lipid Profile No results for input(s): CHOL, HDL, LDLCALC, TRIG, CHOLHDL, LDLDIRECT in the last 72 hours. Thyroid function studies No results for input(s): TSH, T4TOTAL, T3FREE, THYROIDAB in the last 72 hours.  Invalid input(s): FREET3 Anemia work up No results for input(s): VITAMINB12, FOLATE, FERRITIN, TIBC, IRON, RETICCTPCT in the last 72 hours. Urinalysis    Component Value Date/Time   COLORURINE YELLOW 07/12/2017 1826   APPEARANCEUR CLEAR 07/12/2017 1826   LABSPEC 1.010 07/12/2017 1826   PHURINE 6.0 07/12/2017 1826  GLUCOSEU 50 (A) 07/12/2017 1826   HGBUR SMALL (A) 07/12/2017 1826   BILIRUBINUR NEGATIVE 07/12/2017 1826   KETONESUR 20 (A) 07/12/2017 1826   PROTEINUR 100 (A) 07/12/2017 1826   UROBILINOGEN 0.2 01/27/2013 1004   NITRITE NEGATIVE 07/12/2017 1826   LEUKOCYTESUR NEGATIVE 07/12/2017 1826   Sepsis Labs Invalid input(s): PROCALCITONIN,  WBC,  LACTICIDVEN Microbiology No results found for this or any previous visit (from the past 240 hour(s)).   Time coordinating discharge: 45 minutes  SIGNED:   Tawni Millers, MD  [facility] 07/15/2017, 9:55 AM Pager [phone]  If 7PM-7AM, please contact night-coverage [email or web address] Password [email or web address]

## 2017-07-15 NOTE — Clinical Social Work Note (Signed)
Clinical Social Work Assessment  Patient Details  Name: Whitney [name] MRN: [id number] Date of Birth: [birth year]  Date of referral:  07/15/17               Reason for consult:  Facility Placement                Permission sought to share information with:  Facility Art therapist granted to share information::  Yes, Verbal Permission Granted  Name::        Agency::  SNF  Relationship::     Contact Information:     Housing/Transportation Living arrangements for the past 2 months:  Single Family Home Source of Information:  Patient Patient Interpreter Needed:  None Criminal Activity/Legal Involvement Pertinent to Current Situation/Hospitalization:  No - Comment as needed Significant Relationships:  Other Family Members, Siblings Lives with:  Self Do you feel safe going back to the place where you live?  No Need for family participation in patient care:  No (Coment)  Care giving concerns:  Pt from home alone. She indicated that she has no one to care for her at home and is not safe to return at this time.  Social Worker assessment / plan:  CSW discussed the clinical team's recommendation for SNf. Pt in agreement and is willing to go to SNF. CSW explained the SNF process and options. CSW received permission to send to local SNF's in the area. CSW will f/u for disposition.  Employment status:  Disabled (Comment on whether or not currently receiving Disability) Insurance information:  Managed Care PT Recommendations:  [place] / Referral to community resources:  [place]  Patient/Family's Response to care:  Patient appreciative of CSW assistance with SNF placement. No issues or concerns identified.  Patient/Family's Understanding of and Emotional Response to Diagnosis, Current Treatment, and Prognosis:  Patient has good understanding of diagnosis, current treatment and prognosis. Pt hopeful to improve and get back to some  independence. No other issues or concerns identified.  Emotional Assessment Appearance:  Appears stated age Attitude/Demeanor/Rapport:   (Cooperative) Affect (typically observed):  Accepting, Appropriate Orientation:  Oriented to Situation, Oriented to  Time, Oriented to Place, Oriented to Self Alcohol / Substance use:  Not Applicable Psych involvement (Current and /or in the community):  No (Comment)  Discharge Needs  Concerns to be addressed:  Care Coordination Readmission within the last 30 days:    Current discharge risk:  Physical Impairment, Lives alone, Dependent with Mobility Barriers to Discharge:  No Barriers Identified   Normajean Baxter, LCSW 07/15/2017, 12:45 PM

## 2017-07-15 NOTE — Consult Note (Signed)
[facility] CM Primary Care Navigator  07/15/2017  Whitney [name] 28-Dec-1955 [id number]   Met with patient at the bedside to identify possible discharge needs. Patient reports having lost her balance, fell on the kitchen floor and landed on her right side thatresulted to this admission.  Patient endorses Dr. Darcus [name] with [facility] as herprimary care provider.   [facility] [address] [facility].   Patient states managingherown medications at home straight out of the containers.  Patient reports that she was driving self up until this admission. She states that her sister Whitney [name]- lives near [place]) could possibly help her with transportation to her doctors' appointments after discharge. She voiced understanding to contact [facility] care management office if needs transportation resources in the future.  Patient lives at home alone. She is unsure who can provide assistance with care at home after discharge since her family members are older than her, have their own medical issues and lives far from her as reported.    Patient is aware to seek referral to [facility] care management for caregiver resources if needed after discharge from skilled nursing facility.  Anticipated discharge plan is skilled nursing facility (SNF- possibly [facility]) for rehabilitation per therapy recommendation before returning home.  Patient voiced understanding to call primary care provider's officewhen shereturns backhome,for a post discharge follow-up within a week or sooner if needs arise.Patient letter (with PCP's contact number) was provided as areminder.   Discussed with patient regarding [facility] CM services available for health management at home and she had expressed interest and voiced understanding to seekreferral from primary care provider to [facility] care management ifdeemed necessary and appropriatefor  services in the [place] she returns back home.  [facility] care management information was provided for future needs that she may have.  Notified [facility] post acute RN coordinator Burgess Amor, RN) to follow-up patient's needs when in skilled nursing facility.   For questions, please contact:  Dannielle Huh, BSN, RN- [facility] Primary Care Navigator  Telephone: [phone] [place]

## 2017-07-15 NOTE — Social Work (Signed)
Clinical Social Worker facilitated patient discharge including contacting patient family and facility to confirm patient discharge plans.  Clinical information faxed to facility and family agreeable with plan.    CSW arranged ambulance transport via [facility] to [facility].    RN to call [phone] to give report prior to discharge. Pt going to Rm 112.  Clinical Social Worker will sign off for now as social work intervention is no longer needed. Please consult [place] again if new need arises.  Elissa Hefty, LCSW Clinical Social Worker [phone]

## 2017-07-15 NOTE — Progress Notes (Signed)
Inpatient Diabetes Program Recommendations  AACE/[name]: New Consensus Statement on Inpatient Glycemic Control (2015)  Target Ranges:  Prepandial:   less than 140 mg/dL      Peak postprandial:   less than 180 mg/dL (1-2 hours)      Critically ill patients:  140 - 180 mg/dL   Lab Results  Component Value Date   GLUCAP 180 (H) 07/15/2017   HGBA1C 7.6 (H) 07/12/2017    Review of Glycemic ControlResults for FRANCIES, [facility] (MRN [id number]) as of 07/15/2017 09:19  Ref. Range 07/14/2017 06:53 07/14/2017 11:51 07/14/2017 16:36 07/14/2017 20:58 07/15/2017 06:24  Glucose-Capillary Latest Ref Range: 65 - 99 mg/dL 249 (H) 267 (H) 333 (H) 227 (H) 180 (H)   Diabetes history: Type 2 diabetes Outpatient Diabetes medications:Glipizide XL 10 mg daily, Onglyza 5 mg daily Current orders for Inpatient glycemic control:   Levemir 10 units daily, Novolog sensitive tid with meals and HS Inpatient Diabetes Program Recommendations:   Please consider increasing Levemir to 20 units daily.  Also consider adding Novolog meal coverage 4 units tid with meals-hold if patient eats less than 50%.   Thanks, Adah Perl, RN, BC-ADM Inpatient Diabetes Coordinator Pager [phone] (8a-5p)

## 2017-07-15 NOTE — Clinical Social Work Placement (Signed)
   CLINICAL SOCIAL WORK PLACEMENT  NOTE  Date:  07/15/2017  Patient Details  Name: Whitney [name] MRN: [id number] Date of Birth: Jun 19, 1956  Clinical Social Work is seeking post-discharge placement for this patient at the [place] level of care (*CSW will initial, date and re-position this form in  chart as items are completed):  Yes   Patient/family provided with [place] Work Department's list of facilities offering this level of care within the geographic area requested by the patient (or if unable, by the patient's family).  Yes   Patient/family informed of their freedom to choose among providers that offer the needed level of care, that participate in Medicare, Medicaid or managed care program needed by the patient, have an available bed and are willing to accept the patient.  Yes   Patient/family informed of [place]'s ownership interest in [facility] and [facility], as well as of the fact that they are under no obligation to receive care at these facilities.  PASRR submitted to EDS on       PASRR number received on 07/15/17     Existing PASRR number confirmed on       FL2 transmitted to all facilities in geographic area requested by pt/family on 07/15/17     FL2 transmitted to all facilities within larger geographic area on       Patient informed that his/her managed care company has contracts with or will negotiate with certain facilities, including the following:        Yes   Patient/family informed of bed offers received.  Patient chooses bed at [facility] recommends and patient chooses bed at      Patient to be transferred to [facility] on 07/15/17.  Patient to be transferred to facility by [facility]     Patient family notified on 07/15/17 of transfer.  Name of family member notified:  Pt responsible for self     PHYSICIAN Please prepare prescriptions     Additional  Comment:    _______________________________________________ Normajean Baxter, LCSW 07/15/2017, 1:01 PM

## 2017-07-16 ENCOUNTER — Encounter: Payer: Self-pay | Admitting: Internal Medicine

## 2017-07-16 ENCOUNTER — Non-Acute Institutional Stay (SKILLED_NURSING_FACILITY): Payer: Medicare Other | Admitting: Internal Medicine

## 2017-07-16 DIAGNOSIS — Z9189 Other specified personal risk factors, not elsewhere classified: Secondary | ICD-10-CM | POA: Insufficient documentation

## 2017-07-16 DIAGNOSIS — G4733 Obstructive sleep apnea (adult) (pediatric): Secondary | ICD-10-CM | POA: Diagnosis not present

## 2017-07-16 DIAGNOSIS — E1165 Type 2 diabetes mellitus with hyperglycemia: Secondary | ICD-10-CM | POA: Diagnosis not present

## 2017-07-16 DIAGNOSIS — S42214D Unspecified nondisplaced fracture of surgical neck of right humerus, subsequent encounter for fracture with routine healing: Secondary | ICD-10-CM | POA: Diagnosis not present

## 2017-07-16 NOTE — Assessment & Plan Note (Signed)
07/12/17 A1c 7.6% indicating adequate control

## 2017-07-16 NOTE — Progress Notes (Signed)
NURSING HOME LOCATION:  [facility] ROOM NUMBER:  112-A  CODE STATUS:  Full Code  PCP:  Darcus Austin, MD  [place] [address] [address]  This is a comprehensive admission note to [facility] performed on this date less than 30 days from date of admission. Included are preadmission medical/surgical history;reconciled medication list; family history; social history and comprehensive review of systems.  Corrections and additions to the records were documented . Comprehensive physical exam was also performed. Additionally a clinical summary was entered for each active diagnosis pertinent to this admission in the Problem List to enhance continuity of care.  HPI: Patient was hospitalized 10/12-10/15/18 for humeral fracture in the context of mechanical fall. She denies any definite cardiac or neurologic prodrome prior to the fall. She states she simply turned quickly in the kitchen and lost her balance, falling on her shoulder.A displaced comminuted fracture of the proximal right humeral neck was present on plain films. CT showed mildly impacted and medially displaced surgical neck fracture of the right humerus extending into the greater tuberosity which was highly comminuted and displaced. Dr. Alphonzo Severance recommended sling immobilization (shoulder immobilization) for at least 3 weeks. He will see her in follow-up 10/22. Labs at discharge revealed glucoses ranging from 188 up to 237. Last  A1c was 7.6% on 07/12/17. Renal function is normal.  Past medical and surgical history: Includes coronary artery disease with ischemic cardiomyopathy, type 2 diabetes, sleep apnea, dyslipidemia, diabetes and hypertension. She had a myocardial infarction 2013 and has a history of cardiac arrest. She's had urologic stenting in the context of recurrent renal calculi as well as partial hysterectomy.  Social history: Patient never smoked or drank alcohol.  Family history: Limited  history reviewed  Review of systems: She does not check sugars at home. She gave her a glucometer to her mother. She does describe excessive urination. She describes occasional stinging type discomfort in her feet and legs. This was present prior to the fall. At present her major symptom is pain in the shoulder with range of motion. She needs help to go the bathroom and  after bowel movement for cleansing. She describes generalized weakness. She uses nasal pillows with her CPAP.  Constitutional: No fever,significant weight change Eyes: No redness, discharge, pain, vision change ENT/mouth: No nasal congestion,  purulent discharge, earache,change in hearing ,sore throat  Cardiovascular: No chest pain, palpitations,paroxysmal nocturnal dyspnea, claudication, edema  Respiratory: No cough, sputum production,hemoptysis, DOE Gastrointestinal: No heartburn,dysphagia,abdominal pain, nausea / vomiting,rectal bleeding, melena,change in bowels Genitourinary: No dysuria,hematuria, pyuria,  incontinence, nocturia Dermatologic: No rash, pruritus, change in appearance of skin Neurologic: No dizziness,headache,syncope, seizures Psychiatric: No significant anxiety , depression, insomnia, anorexia Endocrine: No change in hair/skin/ nails, excessive thirst, excessive hunger  Hematologic/lymphatic: No significant bruising, lymphadenopathy,abnormal bleeding Allergy/immunology: No itchy/ watery eyes, significant sneezing, urticaria, angioedema  Physical exam:  Pertinent or positive findings: She is obese. She has scattered dental caries with some erosions to the gumline. There is minor hirsutism over the chin. She has a grade 1 systolic murmur at the base. There is suggestion of a right carotid bruit. Abdomen is protuberant. Dorsalis pedis pulses are palpable. Posterior tibial pulses are decreased. She has 1/2+ edema. She has hyperpigmented scarring of the shins.  General appearance:Adequately nourished; no acute  distress , increased work of breathing is present.   Lymphatic: No lymphadenopathy about the head, neck, axilla . Eyes: No conjunctival inflammation or lid edema is present. There is no  scleral icterus. Ears:  External ear exam shows no significant lesions or deformities.   Nose:  External nasal examination shows no deformity or inflammation. Nasal mucosa are pink and moist without lesions ,exudates Oral exam: lips and gums are healthy appearing.There is no oropharyngeal erythema or exudate . Neck:  No thyromegaly, masses, tenderness noted.    Heart:  Normal rate and regular rhythm. S1 and S2 normal without gallop,rub .  Lungs:Chest clear to auscultation without wheezes, rhonchi,rales , rubs. Abdomen:Bowel sounds are normal. Abdomen is soft and nontender with no organomegaly, hernias,masses. GU: deferred  Extremities:  No cyanosis, clubbing  Neurologic exam : Balance,Rhomberg,finger to nose testing could not be completed due to clinical state Skin: Warm & dry w/o tenting. No significant lesions or rash.  See clinical summary under each active problem in the Problem List with associated updated therapeutic plan

## 2017-07-16 NOTE — Assessment & Plan Note (Signed)
F/U 07/22/17 by Dr Marlou Sa as per D/C Summary

## 2017-07-16 NOTE — Assessment & Plan Note (Signed)
Risk of opioids with history of heart failure and sleep apnea discussed. Tylenol dose will be decreased to 500 mg every 6 hours prn

## 2017-07-16 NOTE — Assessment & Plan Note (Signed)
She was asked to have her CPAP machine with nasal pillows brought to the facility

## 2017-07-17 NOTE — Patient Instructions (Signed)
See assessment and plan under each diagnosis in the problem list and acutely for this visit 

## 2017-07-22 ENCOUNTER — Encounter: Payer: Self-pay | Admitting: Adult Health

## 2017-07-22 ENCOUNTER — Non-Acute Institutional Stay (SKILLED_NURSING_FACILITY): Payer: Medicare Other | Admitting: Adult Health

## 2017-07-22 DIAGNOSIS — E1165 Type 2 diabetes mellitus with hyperglycemia: Secondary | ICD-10-CM

## 2017-07-22 DIAGNOSIS — G4733 Obstructive sleep apnea (adult) (pediatric): Secondary | ICD-10-CM

## 2017-07-22 NOTE — Progress Notes (Signed)
DATE:  07/22/2017   MRN:  [id number]  BIRTHDAY: [birth year]  Facility:  Nursing Home Location:  [facility] Room Number: 213-A  LEVEL OF CARE:  SNF [phone])  Contact Information    Name Relation Home Work [place] Brother [phone]  [phone]   Harris,Carol Sister [phone]  [phone]       Code Status History    Date Active Date Inactive Code Status Order ID Comments User Context   07/12/2017  8:28 PM 07/15/2017  7:44 PM Full Code [id number]  Merton Border, MD Inpatient       Chief Complaint  Patient presents with  . Acute Visit    Glaucoma, anxiety, palpitations    HISTORY OF PRESENT ILLNESS:  This is a 18-YO female seen for an acute visit secondary to complaints of having no CPAP @ night and elevated blood sugars. She was seen in the room today and verbalized that she has CPAP @ HS but she has not been using it since she has been @ [facility]. CBGs has been noted to be elevated - 422, 423, 343, 376, 397. She is a short-term rehabilitation resident of [facility].  She has a PMH of CAD with ischemic cardiomyopathy, type 2 DM, sleep apnea, dyslipidemia, and HTN.      PAST MEDICAL HISTORY:  Past Medical History:  Diagnosis Date  . Arthritis    left knee,right hip  . Cardiac arrest ([place])   . Depression   . Diabetes mellitus   . GERD (gastroesophageal reflux disease)   . Glaucoma   . H/O cardiac arrest 11/2011  . Heart murmur   . Hyperlipidemia   . Hypertension   . Myocardial infarction ([place]) 2013  . Sleep apnea    Bring machine,mask and tubing     CURRENT MEDICATIONS: Reviewed  Patient's Medications  New Prescriptions   No medications on file  Previous Medications   ACETAMINOPHEN (TYLENOL) 325 MG TABLET    Take 2 tablets (650 mg total) by mouth every 6 (six) hours as needed for mild pain (or Fever >/= 101).   AMLODIPINE (NORVASC) 5 MG TABLET    Take 5 mg by mouth daily.   ASPIRIN 325 MG  TABLET    Take 1 tablet (325 mg total) by mouth at bedtime.   BETAMETHASONE DIPROPIONATE (DIPROLENE) 0.05 % CREAM    Apply 1 application topically 2 (two) times daily.   BIMATOPROST (LUMIGAN) 0.01 % SOLN    Place 1 drop into both eyes at bedtime.   CYCLOBENZAPRINE (FLEXERIL) 5 MG TABLET    Take 1 tablet (5 mg total) by mouth 3 (three) times daily as needed for muscle spasms.   FUROSEMIDE (LASIX) 20 MG TABLET    Take 1 tablet (20 mg total) by mouth daily.   GLIPIZIDE (GLUCOTROL XL) 10 MG 24 HR TABLET    Take 10 mg by mouth daily.   HYDROCODONE-ACETAMINOPHEN (NORCO/VICODIN) 5-325 MG TABLET    Take 1 tablet by mouth every 6 (six) hours as needed for moderate pain or severe pain.   IBUPROFEN (ADVIL,MOTRIN) 400 MG TABLET    Take 1 tablet (400 mg total) by mouth every 8 (eight) hours as needed for moderate pain.   LISINOPRIL (PRINIVIL,ZESTRIL) 20 MG TABLET    Take 20 mg by mouth daily before breakfast.    MAGNESIUM HYDROXIDE (MILK OF MAGNESIA) 400 MG/5ML SUSPENSION    Take 15 mLs by mouth daily as needed for mild constipation.  METOPROLOL SUCCINATE (TOPROL-XL) 100 MG 24 HR TABLET    Take 100 mg by mouth daily before breakfast. Take with or immediately following a meal.   ONGLYZA 5 MG TABS TABLET    Take 5 mg by mouth daily.   OXYBUTYNIN (DITROPAN) 5 MG TABLET    Take 5 mg by mouth daily.   PANTOPRAZOLE (PROTONIX) 40 MG TABLET    Take 40 mg by mouth daily.   POTASSIUM CHLORIDE (K-DUR) 10 MEQ TABLET    Take 10 mEq by mouth daily.   PRAVASTATIN (PRAVACHOL) 40 MG TABLET    Take 40 mg by mouth at bedtime.    TRAMADOL (ULTRAM) 50 MG TABLET    Take 100 mg by mouth every 8 (eight) hours as needed for severe pain.  Modified Medications   No medications on file  Discontinued Medications   CLOBETASOL OINTMENT (TEMOVATE) 0.05 %    Apply 1 application topically 2 (two) times daily as needed (itching).    HYDROCODONE-ACETAMINOPHEN (NORCO/VICODIN) 5-325 MG TABLET    Take 1 tablet by mouth every 4 (four) hours as  needed for severe pain.   POTASSIUM CHLORIDE ER 20 MEQ TBCR    Take 10 mEq by mouth daily.   TRAMADOL (ULTRAM) 50 MG TABLET    Take 2 tablets (100 mg total) by mouth 2 (two) times daily as needed for moderate pain or severe pain.     Allergies  Allergen Reactions  . Contrast Media [Iodinated Diagnostic Agents] Anaphylaxis     REVIEW OF SYSTEMS:  GENERAL: no change in appetite, no fatigue, no weight changes, no fever, chills or weakness MOUTH and THROAT: Denies oral discomfort   RESPIRATORY: no cough, DOE, wheezing, hemoptysis CARDIAC: no chest pain, edema or palpitations GI: no abdominal pain, diarrhea, constipation, heart burn, nausea or vomiting GU: Denies dysuria, frequency, hematuria, incontinence, or discharge PSYCHIATRIC: Denies feeling of depression or anxiety. No report of hallucinations, insomnia, paranoia, or agitation     PHYSICAL EXAMINATION  GENERAL APPEARANCE: Well nourished. In no acute distress. Morbidly obese SKIN:  Skin is warm and dry.  MOUTH and THROAT: Lips are without lesions. Oral mucosa is moist and without lesions.  RESPIRATORY: breathing is even & unlabored, BS CTAB CARDIAC: RRR, no murmur,no extra heart sounds, no edema GI: abdomen soft, normal BS, no masses, no tenderness, no hepatomegaly, no splenomegaly EXTREMITIES:  Able to move X 4 extremities, right arm on sling PSYCHIATRIC: Alert and oriented X 3. Affect and behavior are appropriate   LABS/RADIOLOGY: Labs reviewed: Basic Metabolic Panel:  Recent Labs  07/13/17 0302 07/14/17 0535 07/15/17 0335  NA 139 138 139  K 3.2* 3.6 3.6  CL 106 104 104  CO2 23 27 28   GLUCOSE 188* 237* 212*  BUN 5* 8 8  CREATININE 0.50 0.52 0.50  CALCIUM 8.8* 8.6* 8.5*   CBC:  Recent Labs  07/12/17 1525  WBC 15.9*  HGB 11.8*  HCT 37.2  MCV 82.7  PLT 236   Cardiac Enzymes:  Recent Labs  07/12/17 1525 07/13/17 0302  CKTOTAL 1,452* 677*   CBG:  Recent Labs  07/15/17 0624 07/15/17 1156  07/15/17 1604  GLUCAP 180* 305* 281*      Dg Chest 1 View  Result Date: 07/12/2017 CLINICAL DATA:  [place] onto right shoulder. EXAM: CHEST 1 VIEW COMPARISON:  01/04/2013 FINDINGS: Lungs are clear. Heart is mildly enlarged but may be accentuated by the AP supine technique. Negative for a pneumothorax. Bridging osteophytes along the right side of thoracic spine.  Fracture involving the proximal right humerus. Linear density along the mid left clavicle could be related to overlying scapula but this area is incompletely evaluated. IMPRESSION: No acute cardiopulmonary disease. Fracture of the proximal right humerus. Incomplete evaluation of the left clavicle. If there is concern for an injury in this area, recommend dedicated images. Electronically Signed   By: Markus Daft M.D.   On: 07/12/2017 17:28   Dg Shoulder Right  Result Date: 07/12/2017 CLINICAL DATA:  Fall.  Right shoulder pain. EXAM: RIGHT SHOULDER - 2+ VIEW COMPARISON:  No recent prior. FINDINGS: Displaced comminuted fracture of the proximal right humeral neck noted. No evidence of dislocation . Acromioclavicular degenerative change present. IMPRESSION: Displaced comminuted fracture of the proximal right humeral neck. Electronically Signed   By: Marcello Moores  Register   On: 07/12/2017 17:26   Dg Tibia/fibula Left  Result Date: 07/12/2017 CLINICAL DATA:  Left leg pain post fall. EXAM: LEFT TIBIA AND FIBULA - 2 VIEW COMPARISON:  None. FINDINGS: There is no evidence of fracture. Suboptimal visualization of the left knee severe osteoarthritic changes with joint space loss, subchondral sclerosis, remodeling and cyst formation an osteophyte formation. Soft tissues are unremarkable. IMPRESSION: No definite evidence of fracture. Suboptimal visualization of the left knee, which demonstrates 3 compartment severe osteoarthritic changes. Electronically Signed   By: Fidela Salisbury M.D.   On: 07/12/2017 17:28   Ct Shoulder Right Wo Contrast  Result Date:  07/12/2017 CLINICAL DATA:  Right humerus fracture due to a fall today. Initial encounter. EXAM: CT OF THE UPPER RIGHT EXTREMITY WITHOUT CONTRAST TECHNIQUE: Multidetector CT imaging of the upper right extremity was performed according to the standard protocol. COMPARISON:  Plain films right shoulder earlier today. FINDINGS: Bones/Joint/Cartilage As seen on the comparison plain films, the patient has a mildly impacted surgical neck fracture of the right humerus. The fracture shows slight medial displacement. The lesser tuberosity is spared. The fracture involves the greater tuberosity which is mildly comminuted and posteriorly and inferiorly displaced. The humeral head is located and the acromioclavicular joint is intact. No other fracture is identified. No lytic or sclerotic bony lesion. Ligaments Suboptimally assessed by CT. Muscles and Tendons The rotator cuff appears intact. Soft tissues Imaged lung parenchyma is clear. IMPRESSION: Mildly impacted and medially displaced surgical neck fracture of the right humerus extends into the greater tuberosity which is mildly comminuted and displaced. Acromioclavicular osteoarthritis. Electronically Signed   By: Inge Rise M.D.   On: 07/12/2017 18:52   Dg Femur Min 2 Views Left  Result Date: 07/12/2017 CLINICAL DATA:  Fall.  Pain left lower extremity. EXAM: LEFT FEMUR 2 VIEWS COMPARISON:  No recent prior. FINDINGS: Left femur is intact. Left hip is intact. Severe degenerative changes left knee. Deformity noted the medial tibial plateau, this may be from degenerative change. Subtle fracture cannot be excluded . IMPRESSION: 1.  No acute left femoral abnormality identified. 2. Severe degenerative changes left knee.Deformity noted of the medial tibial plateau. This may be from degenerative change. Subtle fracture cannot be excluded. If need be further evaluation with MRI can be obtained. Electronically Signed   By: Marcello Moores  Register   On: 07/12/2017 17:29     ASSESSMENT/PLAN:  1. Uncontrolled type 2 diabetes mellitus with hyperglycemia (HCC) - CBGs has been noted to be elevated, continue Onglyza 5 mg daily and glipizide ER 10 mg daily and add glipizide ER 5 mg 1 tab daily, continue CBG daily   2. OSA (obstructive sleep apnea) - start CPAP at at bedtime with  setting 7 cm AHI 10/h, instructed staff to assist patient in setting up CPAP      Monina C. E[place] - NP    [employer] [phone]

## 2017-07-23 ENCOUNTER — Other Ambulatory Visit: Payer: Self-pay | Admitting: *Deleted

## 2017-07-23 NOTE — Patient Outreach (Signed)
[facility]) Care Management  07/23/2017  Whitney [name] Dec 10, 1955 [id number]  Met with patient and her friend at facility.  Patient reports she is just getting to the point where she can stand but cannot walk.  She cannot use her right arm.  She is concerned about going home. She feels she will need some assistance with meals and transportation.  She lives alone, she does have a sister and lots of friends.   RNCM reviewed [name] program. RNCM left a [name] packet for patient to review.  Patient feels she may want care coordination and may need nurse and SW.   Plan to follow up at next facility visit.  Royetta Crochet. Laymond Purser, RN, BSN, [place] [phone]) Business Cell  [phone]) Toll Free Office

## 2017-07-25 ENCOUNTER — Encounter: Payer: Self-pay | Admitting: Internal Medicine

## 2017-07-25 ENCOUNTER — Non-Acute Institutional Stay (SKILLED_NURSING_FACILITY): Payer: Medicare Other | Admitting: Internal Medicine

## 2017-07-25 DIAGNOSIS — M5416 Radiculopathy, lumbar region: Secondary | ICD-10-CM

## 2017-07-25 DIAGNOSIS — E1165 Type 2 diabetes mellitus with hyperglycemia: Secondary | ICD-10-CM

## 2017-07-25 DIAGNOSIS — R350 Frequency of micturition: Secondary | ICD-10-CM | POA: Diagnosis not present

## 2017-07-25 NOTE — Assessment & Plan Note (Signed)
Gabapentin 100 mg 3 times a day with titration as needed Consult PT/OT as to any interventions.

## 2017-07-25 NOTE — Progress Notes (Signed)
NURSING HOME LOCATION:  [facility] ROOM NUMBER:  [id number]  CODE STATUS:  Full Code  PCP:  Darcus Austin, MD  [place] [address] [address]  This is a nursing facility follow up for specific acute issue of  hyperglycemia and increased pain in the right hip  Interim medical record and care since last [facility] visit was updated with review of diagnostic studies and change in clinical status since last visit were documented.  HPI: Her nurse notified me that fasting blood sugars have been 300-400 plus over the last several days, she is not on insulin. The patient blames her high sugars on white bread and rice here at the SNF. She states she has asked her family to bring in brown bread. The last A1c was 7.6% on 07/12/17. She's been on glipizide and Onglyza. She describes urinary frequency and has not been taking her furosemide. She also has polydipsia. She denies other genitourinary symptoms. She denies any significant skin lesions.  Renal function is normal. She has no current liver function test on record. Additionally she's had increased pain in the right hip and has requested an evaluation in the ED. Staff has requested an x-ray. The patient stated that the "hip pain" began actually 4 PM while she was sitting in wheelchair. She thought she had been in the wheelchair for several hours. Pain was described as aching and constant and intermittently stinging. It started over the top of the  R buttock and radiated down the right lateral leg to mid calf. She also had some discomfort in the right heel as well. There is no associated urinary or stool incontinence. She has not noted a rash in this area. She had no injury to this area. Remotely 30 years ago she states she had a ruptured disc and required surgery. She also had shingles in 2010 but this was on the thorax.  Review of systems:  Constitutional: No fever,significant weight change, fatigue  Eyes: No redness,  discharge, pain, vision change ENT/mouth: No nasal congestion,  purulent discharge, earache,change in hearing ,sore throat  Cardiovascular: No chest pain, palpitations,paroxysmal nocturnal dyspnea, claudication, edema  Respiratory: No cough, sputum production,hemoptysis, DOE , significant snoring,apnea  Gastrointestinal: No heartburn,dysphagia,abdominal pain, nausea / vomiting,rectal bleeding, melena,change in bowels Genitourinary: No dysuria,hematuria, pyuria,  incontinence, nocturia Musculoskeletal: No joint stiffness, joint swelling, weakness,pain Dermatologic: No rash, pruritus, change in appearance of skin Neurologic: No dizziness,headache,syncope, seizures Psychiatric: No significant anxiety , depression, insomnia, anorexia Endocrine: No change in hair/skin/ nails, excessive hunger  Hematologic/lymphatic: No significant bruising, lymphadenopathy,abnormal bleeding Allergy/immunology: No itchy/ watery eyes, significant sneezing, urticaria, angioedema  Physical exam:  Pertinent or positive findings: Initially the patient was crying and yelling that she had to be sent to the hospital. I explained that I needed to define the pain and she became much calmer & was actually able to give a history as noted above. I showed her a diagram of lumbar dermatomes and she identified the L5 radiculopathy as indicative of her presentation. Right upper extremity is in a sling. She has a grade 1 systolic murmur. Breath sounds are decreased. Abdomen is massive with a large panniculus. There is relative atrophy of the lower extremities below the knees. Knees are massive. Pedal pulses are palpable, dorsalis pedis pulses are stronger than the posterior tibial pulses. She has hyperpigmentation of the lower shins with some induration, greater on the left than the right.  General appearance:Adequately nourished; no acute distress , increased work  of breathing is present.   Lymphatic: No lymphadenopathy about the head,  neck, axilla . Eyes: No conjunctival inflammation or lid edema is present. There is no scleral icterus. Neck:  No thyromegaly, masses, tenderness noted.    Heart:  regular rhythm. S1 and S2 normal without gallop, click, rub .  Lungs: without wheezes, rhonchi,rales , rubs. Abdomen:Bowel sounds are normal. Abdomen is soft and nontender with no organomegaly, hernias,masses. GU: deferred  Extremities:  No cyanosis, clubbing,edema  Neurologic exam : Balance,Rhomberg,finger to nose testing could not be completed due to clinical state Skin: Warm & dry w/o tenting. No significant rash.  See summary under each acute problem & in the Problem List with associated updated therapeutic plan

## 2017-07-25 NOTE — Assessment & Plan Note (Signed)
07/25/17 glipizide will be discontinued. Basal insulin and short-acting insulin before lunch and dinner will be initiated.

## 2017-07-27 ENCOUNTER — Encounter: Payer: Self-pay | Admitting: Internal Medicine

## 2017-07-27 NOTE — Patient Instructions (Signed)
See assessment and plan under each diagnosis in the problem list and acutely for this visit 

## 2017-07-28 ENCOUNTER — Emergency Department (HOSPITAL_COMMUNITY): Payer: Medicare Other

## 2017-07-28 ENCOUNTER — Emergency Department (HOSPITAL_COMMUNITY)
Admission: EM | Admit: 2017-07-28 | Discharge: 2017-07-28 | Disposition: A | Discharge status: Home / Self Care | Payer: Medicare Other | Attending: Emergency Medicine | Admitting: Emergency Medicine

## 2017-07-28 DIAGNOSIS — S79911A Unspecified injury of right hip, initial encounter: Secondary | ICD-10-CM | POA: Diagnosis not present

## 2017-07-28 DIAGNOSIS — I5022 Chronic systolic (congestive) heart failure: Secondary | ICD-10-CM | POA: Insufficient documentation

## 2017-07-28 DIAGNOSIS — I110 Hypertensive heart disease with heart failure: Secondary | ICD-10-CM | POA: Diagnosis not present

## 2017-07-28 DIAGNOSIS — Z7984 Long term (current) use of oral hypoglycemic drugs: Secondary | ICD-10-CM | POA: Insufficient documentation

## 2017-07-28 DIAGNOSIS — Z7902 Long term (current) use of antithrombotics/antiplatelets: Secondary | ICD-10-CM | POA: Diagnosis not present

## 2017-07-28 DIAGNOSIS — Z79899 Other long term (current) drug therapy: Secondary | ICD-10-CM | POA: Insufficient documentation

## 2017-07-28 DIAGNOSIS — M5431 Sciatica, right side: Secondary | ICD-10-CM

## 2017-07-28 DIAGNOSIS — Z7982 Long term (current) use of aspirin: Secondary | ICD-10-CM | POA: Diagnosis not present

## 2017-07-28 DIAGNOSIS — R600 Localized edema: Secondary | ICD-10-CM | POA: Diagnosis not present

## 2017-07-28 DIAGNOSIS — E119 Type 2 diabetes mellitus without complications: Secondary | ICD-10-CM | POA: Diagnosis not present

## 2017-07-28 DIAGNOSIS — M25551 Pain in right hip: Secondary | ICD-10-CM | POA: Diagnosis not present

## 2017-07-28 DIAGNOSIS — R52 Pain, unspecified: Secondary | ICD-10-CM

## 2017-07-28 DIAGNOSIS — S299XXA Unspecified injury of thorax, initial encounter: Secondary | ICD-10-CM | POA: Diagnosis not present

## 2017-07-28 LAB — I-STAT CHEM 8, ED
BUN: 18 mg/dL (ref 6–20)
CHLORIDE: 102 mmol/L (ref 101–111)
Calcium, Ion: 1.19 mmol/L (ref 1.15–1.40)
Creatinine, Ser: 0.6 mg/dL (ref 0.44–1.00)
Glucose, Bld: 271 mg/dL — ABNORMAL HIGH (ref 65–99)
HEMATOCRIT: 37 % (ref 36.0–46.0)
Hemoglobin: 12.6 g/dL (ref 12.0–15.0)
Potassium: 4.2 mmol/L (ref 3.5–5.1)
SODIUM: 138 mmol/L (ref 135–145)
TCO2: 26 mmol/L (ref 22–32)

## 2017-07-28 LAB — CBC WITH DIFFERENTIAL/PLATELET
BASOS ABS: 0 10*3/uL (ref 0.0–0.1)
BASOS PCT: 0 %
EOS ABS: 0.2 10*3/uL (ref 0.0–0.7)
EOS PCT: 2 %
HCT: 37.2 % (ref 36.0–46.0)
HEMOGLOBIN: 11.7 g/dL — AB (ref 12.0–15.0)
Lymphocytes Relative: 27 %
Lymphs Abs: 2.7 10*3/uL (ref 0.7–4.0)
MCH: 26.2 pg (ref 26.0–34.0)
MCHC: 31.5 g/dL (ref 30.0–36.0)
MCV: 83.2 fL (ref 78.0–100.0)
Monocytes Absolute: 0.5 10*3/uL (ref 0.1–1.0)
Monocytes Relative: 5 %
NEUTROS PCT: 66 %
Neutro Abs: 6.6 10*3/uL (ref 1.7–7.7)
PLATELETS: 329 10*3/uL (ref 150–400)
RBC: 4.47 MIL/uL (ref 3.87–5.11)
RDW: 13.5 % (ref 11.5–15.5)
WBC: 10 10*3/uL (ref 4.0–10.5)

## 2017-07-28 MED ORDER — DEXAMETHASONE 4 MG PO TABS
10.0000 mg | ORAL_TABLET | Freq: Once | ORAL | Status: AC
  Administered 2017-07-28: 10 mg via ORAL
  Filled 2017-07-28: qty 3

## 2017-07-28 MED ORDER — KETOROLAC TROMETHAMINE 30 MG/ML IJ SOLN
30.0000 mg | Freq: Once | INTRAMUSCULAR | Status: AC
  Administered 2017-07-28: 30 mg via INTRAMUSCULAR
  Filled 2017-07-28: qty 1

## 2017-07-28 MED ORDER — LIDOCAINE 5 % EX PTCH
1.0000 | MEDICATED_PATCH | CUTANEOUS | Status: DC
  Administered 2017-07-28: 1 via TRANSDERMAL
  Filled 2017-07-28: qty 1

## 2017-07-28 NOTE — Consult Note (Signed)
I was called to review imaging on Ms. Koenig who had shooting pain in her right hip. A CT scan was performed and there was question as to whether or not there was a nondisplaced femoral neck fracture. I requested an MRI. The MRI has been performed and from my read there is no acute fracture and no significant musculoskeletal pathology. Would recommend exploring another etiology of pain. No formal consult performed. Please call with questions.  Shona Needles, MD [facility] [phone] (phone)

## 2017-07-28 NOTE — ED Triage Notes (Signed)
Pt in via [facility] from [facility]. C/o sharp, shooting R hip pain. Denies any other s/s @ this time. [facility] a couple of weeks ago w/ hx of R shoulder fx. Rates pain @ 7/10 right now in hip. A&O x4 on arrival.

## 2017-07-28 NOTE — ED Notes (Signed)
Social work spooke with the patient about her concerns for nursing facility including her meal trays. I spoke with Sharlett Iles RN and voiced the patient's concerns. Pt agreeable to go back to facility. Hand off report given to [facility] and [facility]. Pt brought no belongings with her.

## 2017-07-28 NOTE — Progress Notes (Signed)
CSW met with patient to discuss returning back to [facility] from [facility] ED. Patient came to ED via ambulance this morning due to pain in her hip. Patient received a medical evaluation and will be returning back to [facility]. RN Anna had already set up [facility], CSW confirmed with facility to accept patient back, Rhonda from [facility] in agreement. RN Anna made report to facility.  CSW expressed patient's concerns regarding her nutrition and care that she is receiving specifically from 2nd shift staff at facility.  CSW signing off.  [facility] arrived to transport patient.  Carol Watson, MSW, LCSW-A Weekend Clinical Social Worker [phone]  

## 2017-07-28 NOTE — ED Provider Notes (Addendum)
[facility] EMERGENCY DEPARTMENT Provider Note   CSN: [id number] Arrival date & time: 07/28/17  0120     History   Chief Complaint Chief Complaint  Patient presents with  . Hip Pain    HPI Whitney [name] is a 61 y.o. female.  The history is provided by the patient.  Hip Pain  The current episode started more than 1 week ago. The problem occurs constantly. The problem has been gradually worsening. Pertinent negatives include no chest pain, no abdominal pain, no headaches and no shortness of breath.  Buttock and hip pain for 2 weeks, she had a fall at that time fracturing her humerus.  Tramadol is not helping the pain.  Pain usually starts at hip and goes to the foot sometimes the R buttock is involved as well.  Worse with leg raise.  No rotation or shortening.    Past Medical History:  Diagnosis Date  . Arthritis    left knee,right hip  . Cardiac arrest ([place])   . Depression   . Diabetes mellitus   . GERD (gastroesophageal reflux disease)   . Glaucoma   . H/O cardiac arrest 11/2011  . Heart murmur   . Hyperlipidemia   . Hypertension   . Myocardial infarction ([place]) 2013  . Sleep apnea    Bring machine,mask and tubing    Patient Active Problem List   Diagnosis Date Noted  . Acute right lumbar radiculopathy 07/25/2017  . At risk for adverse drug event 07/16/2017  . Humerus fracture 07/12/2017  . Morbid obesity due to excess calories ([place]) 12/14/2015  . Chronic systolic heart failure ([place]) 12/14/2015  . Chest pain 12/14/2015  . Angina decubitus ([place]) 12/14/2015  . Adiposity 01/06/2013  . Arthritis of knee, degenerative 01/06/2013  . OSA (obstructive sleep apnea) 11/30/2011  . Cardiac arrest ([place]) 11/13/2011  . Diabetes mellitus type 2, uncontrolled ([place]) 11/13/2011    Past Surgical History:  Procedure Laterality Date  . BACK SURGERY    . COLONOSCOPY N/A 09/12/2016   Procedure: COLONOSCOPY;  Surgeon: Clarene Essex, MD;  Location: [facility];   Service: Endoscopy;  Laterality: N/A;  . CYSTOSCOPY W/ URETERAL STENT PLACEMENT Left 01/04/2013   Procedure: CYSTOSCOPY WITH RETROGRADE PYELOGRAM/URETERAL STENT PLACEMENT;  Surgeon: Bernestine Amass, MD;  Location: [facility];  Service: Urology;  Laterality: Left;  . CYSTOSCOPY WITH RETROGRADE PYELOGRAM, URETEROSCOPY AND STENT PLACEMENT Left 01/26/2013   Procedure: CYSTOSCOPY, JJ STENT REMOVAL, LEFT URETEROSCOPY, ;  Surgeon: Bernestine Amass, MD;  Location: [facility];  Service: Urology;  Laterality: Left;  CYSTO, JJ STENT REMOVAL, LEFT URETEROSCOPY   . FOOT SURGERY    . HERNIA REPAIR    . HOLMIUM LASER APPLICATION Left 12/12/9700   Procedure: HOLMIUM LASER LITHOTRIPSY ;  Surgeon: Bernestine Amass, MD;  Location: [facility];  Service: Urology;  Laterality: Left;  . KNEE SURGERY     left  . PARTIAL HYSTERECTOMY      OB History    No data available       Home Medications    Prior to Admission medications   Medication Sig Start Date End Date Taking? Authorizing Provider  acetaminophen (TYLENOL) 500 MG tablet Take 500 mg by mouth every 6 (six) hours as needed.    [provider]  amLODipine (NORVASC) 5 MG tablet Take 5 mg by mouth daily. 11/07/15   [provider]  aspirin 325 MG tablet Take 1 tablet (325 mg total) by mouth at bedtime. 07/15/17  Arrien, Jimmy Picket, MD  betamethasone dipropionate (DIPROLENE) 0.05 % cream Apply 1 application topically 2 (two) times daily.    [provider]  bimatoprost (LUMIGAN) 0.01 % SOLN Place 1 drop into both eyes at bedtime.    [provider]  cyclobenzaprine (FLEXERIL) 5 MG tablet Take 1 tablet (5 mg total) by mouth 3 (three) times daily as needed for muscle spasms. 07/15/17   Arrien, Jimmy Picket, MD  furosemide (LASIX) 20 MG tablet Take 1 tablet (20 mg total) by mouth daily. 12/14/15   Jerline Pain, MD  glipiZIDE (GLUCOTROL XL) 10 MG 24 hr tablet Take 10 mg by mouth daily. 12/08/15   [provider]    HYDROcodone-acetaminophen (NORCO/VICODIN) 5-325 MG tablet Take 1 tablet by mouth every 6 (six) hours as needed for moderate pain or severe pain.    [provider]  ibuprofen (ADVIL,MOTRIN) 400 MG tablet Take 1 tablet (400 mg total) by mouth every 8 (eight) hours as needed for moderate pain. 07/15/17 07/15/18  Arrien, Jimmy Picket, MD  lisinopril (PRINIVIL,ZESTRIL) 20 MG tablet Take 20 mg by mouth daily before breakfast.     [provider]  magnesium hydroxide (MILK OF MAGNESIA) 400 MG/5ML suspension Take 15 mLs by mouth daily as needed for mild constipation.    [provider]  metoprolol succinate (TOPROL-XL) 100 MG 24 hr tablet Take 100 mg by mouth daily before breakfast. Take with or immediately following a meal.    [provider]  ONGLYZA 5 MG TABS tablet Take 5 mg by mouth daily. 11/22/15   [provider]  oxybutynin (DITROPAN) 5 MG tablet Take 5 mg by mouth daily. 06/15/17   [provider]  pantoprazole (PROTONIX) 40 MG tablet Take 40 mg by mouth daily.    [provider]  potassium chloride (K-DUR) 10 MEQ tablet Take 10 mEq by mouth daily.    [provider]  pravastatin (PRAVACHOL) 40 MG tablet Take 40 mg by mouth at bedtime.     [provider]  traMADol (ULTRAM) 50 MG tablet Take 100 mg by mouth every 8 (eight) hours as needed for severe pain.    [provider]    Family History Family History  Problem Relation Age of Onset  . Asthma Mother     Social History Social History  Substance Use Topics  . Smoking status: Never Smoker  . Smokeless tobacco: Never Used  . Alcohol use No     Allergies   Contrast media [iodinated diagnostic agents]   Review of Systems Review of Systems  Constitutional: Negative for appetite change.  Respiratory: Negative for shortness of breath.   Cardiovascular: Negative for chest pain.  Gastrointestinal: Negative for abdominal pain.   Musculoskeletal: Positive for arthralgias.  Neurological: Negative for headaches.  All other systems reviewed and are negative.    Physical Exam Updated Vital Signs BP 131/73   Pulse 86   Temp 98 F (36.7 C) (Oral)   Resp 18   Ht 5\' 3"  (1.6 m)   Wt (!) 139.7 kg (307 lb 15.7 oz)   SpO2 99%   BMI 54.56 kg/m   Physical Exam  Constitutional: She appears well-developed and well-nourished. No distress.  HENT:  Head: Normocephalic and atraumatic.  Nose: Nose normal.  Mouth/Throat: No oropharyngeal exudate.  Eyes: Pupils are equal, round, and reactive to light. Conjunctivae are normal.  Neck: Normal range of motion. Neck supple. No JVD present.  Cardiovascular: Normal rate, regular rhythm, normal heart sounds  and intact distal pulses.   Pulmonary/Chest: Effort normal. She has no wheezes. She has no rales.  Abdominal: Soft. Bowel sounds are normal. She exhibits no distension and no mass. There is no tenderness. There is no guarding.  Musculoskeletal: Normal range of motion.  No foreshortening or external rotation of the RLE  Neurological: She is alert. She displays normal reflexes.  Skin: Skin is warm and dry. Capillary refill takes less than 2 seconds.  Psychiatric: She has a normal mood and affect.  Nursing note and vitals reviewed.    ED Treatments / Results  Labs (all labs ordered are listed, but only abnormal results are displayed)  Results for orders placed or performed during the hospital encounter of 07/28/17  CBC with Differential/Platelet  Result Value Ref Range   WBC 10.0 4.0 - 10.5 K/uL   RBC 4.47 3.87 - 5.11 MIL/uL   Hemoglobin 11.7 (L) 12.0 - 15.0 g/dL   HCT 37.2 36.0 - 46.0 %   MCV 83.2 78.0 - 100.0 fL   MCH 26.2 26.0 - 34.0 pg   MCHC 31.5 30.0 - 36.0 g/dL   RDW 13.5 11.5 - 15.5 %   Platelets 329 150 - 400 K/uL   Neutrophils Relative % 66 %   Neutro Abs 6.6 1.7 - 7.7 K/uL   Lymphocytes Relative 27 %   Lymphs Abs 2.7 0.7 - 4.0 K/uL   Monocytes  Relative 5 %   Monocytes Absolute 0.5 0.1 - 1.0 K/uL   Eosinophils Relative 2 %   Eosinophils Absolute 0.2 0.0 - 0.7 K/uL   Basophils Relative 0 %   Basophils Absolute 0.0 0.0 - 0.1 K/uL  I-Stat Chem 8, ED  Result Value Ref Range   Sodium 138 135 - 145 mmol/L   Potassium 4.2 3.5 - 5.1 mmol/L   Chloride 102 101 - 111 mmol/L   BUN 18 6 - 20 mg/dL   Creatinine, Ser 0.60 0.44 - 1.00 mg/dL   Glucose, Bld 271 (H) 65 - 99 mg/dL   Calcium, Ion 1.19 1.15 - 1.40 mmol/L   TCO2 26 22 - 32 mmol/L   Hemoglobin 12.6 12.0 - 15.0 g/dL   HCT 37.0 36.0 - 46.0 %   Dg Chest 1 View  Result Date: 07/12/2017 CLINICAL DATA:  [place] onto right shoulder. EXAM: CHEST 1 VIEW COMPARISON:  01/04/2013 FINDINGS: Lungs are clear. Heart is mildly enlarged but may be accentuated by the AP supine technique. Negative for a pneumothorax. Bridging osteophytes along the right side of thoracic spine. Fracture involving the proximal right humerus. Linear density along the mid left clavicle could be related to overlying scapula but this area is incompletely evaluated. IMPRESSION: No acute cardiopulmonary disease. Fracture of the proximal right humerus. Incomplete evaluation of the left clavicle. If there is concern for an injury in this area, recommend dedicated images. Electronically Signed   By: Markus Daft M.D.   On: 07/12/2017 17:28   Dg Chest 2 View  Result Date: 07/28/2017 CLINICAL DATA:  Status post fall 2 weeks ago, with concern for chest injury. Initial encounter. EXAM: CHEST  2 VIEW COMPARISON:  Chest radiograph performed 07/12/2017 FINDINGS: The lungs are well-aerated and clear. There is no evidence of focal opacification, pleural effusion or pneumothorax. The heart is mildly enlarged. There is a comminuted and mildly displaced fracture of the right humeral head and neck. IMPRESSION: 1. Comminuted and mildly displaced fracture of the right humeral head and neck, as previously noted. 2. Mild cardiomegaly.  Lungs remain  grossly clear. Electronically Signed   By: Garald Balding M.D.   On: 07/28/2017 06:55   Dg Shoulder Right  Result Date: 07/12/2017 CLINICAL DATA:  Fall.  Right shoulder pain. EXAM: RIGHT SHOULDER - 2+ VIEW COMPARISON:  No recent prior. FINDINGS: Displaced comminuted fracture of the proximal right humeral neck noted. No evidence of dislocation . Acromioclavicular degenerative change present. IMPRESSION: Displaced comminuted fracture of the proximal right humeral neck. Electronically Signed   By: Marcello Moores  Register   On: 07/12/2017 17:26   Dg Tibia/fibula Left  Result Date: 07/12/2017 CLINICAL DATA:  Left leg pain post fall. EXAM: LEFT TIBIA AND FIBULA - 2 VIEW COMPARISON:  None. FINDINGS: There is no evidence of fracture. Suboptimal visualization of the left knee severe osteoarthritic changes with joint space loss, subchondral sclerosis, remodeling and cyst formation an osteophyte formation. Soft tissues are unremarkable. IMPRESSION: No definite evidence of fracture. Suboptimal visualization of the left knee, which demonstrates 3 compartment severe osteoarthritic changes. Electronically Signed   By: Fidela Salisbury M.D.   On: 07/12/2017 17:28   Ct Shoulder Right Wo Contrast  Result Date: 07/12/2017 CLINICAL DATA:  Right humerus fracture due to a fall today. Initial encounter. EXAM: CT OF THE UPPER RIGHT EXTREMITY WITHOUT CONTRAST TECHNIQUE: Multidetector CT imaging of the upper right extremity was performed according to the standard protocol. COMPARISON:  Plain films right shoulder earlier today. FINDINGS: Bones/Joint/Cartilage As seen on the comparison plain films, the patient has a mildly impacted surgical neck fracture of the right humerus. The fracture shows slight medial displacement. The lesser tuberosity is spared. The fracture involves the greater tuberosity which is mildly comminuted and posteriorly and inferiorly displaced. The humeral head is located and the acromioclavicular joint is  intact. No other fracture is identified. No lytic or sclerotic bony lesion. Ligaments Suboptimally assessed by CT. Muscles and Tendons The rotator cuff appears intact. Soft tissues Imaged lung parenchyma is clear. IMPRESSION: Mildly impacted and medially displaced surgical neck fracture of the right humerus extends into the greater tuberosity which is mildly comminuted and displaced. Acromioclavicular osteoarthritis. Electronically Signed   By: Inge Rise M.D.   On: 07/12/2017 18:52   Ct Hip Right Wo Contrast  Result Date: 07/28/2017 CLINICAL DATA:  Status post fall several weeks ago, with persistent sharp right hip pain. Initial encounter. EXAM: CT OF THE RIGHT HIP WITHOUT CONTRAST TECHNIQUE: Multidetector CT imaging of the right hip was performed according to the standard protocol. Multiplanar CT image reconstructions were also generated. COMPARISON:  Right hip radiographs performed earlier today at 2:01 a.m., and CT of the abdomen and pelvis performed 01/27/2013 FINDINGS: Bones/Joint/Cartilage A minimally displaced subcapital fracture through the right femoral neck is suspected. This could be better assessed on MRI, if deemed clinically necessary. The right femoral head remains seated at the acetabulum. No significant hip joint effusion is seen. The cartilage is not well assessed on CT. Vacuum phenomenon is noted at the right sacroiliac joint. Ligaments Suboptimally assessed by CT. Muscles and Tendons Mild soft tissue injury is suggested about the right gluteus musculature. Visualized tendon structures are grossly unremarkable. Soft tissues The visualized small and large bowel loops are grossly unremarkable. The bladder is mildly distended and unremarkable in appearance, though incompletely characterized. A mildly prominent right inguinal node is seen, measuring 1.2 cm in short axis. IMPRESSION: 1. Suspect minimally displaced subcapital fracture through the right femoral neck. This could be better  assessed on MRI, if deemed clinically necessary. 2. Mild soft tissue injury suggested about the  right gluteus musculature. Electronically Signed   By: Garald Balding M.D.   On: 07/28/2017 05:17   Mr Hip Right Wo Contrast  Result Date: 07/28/2017 CLINICAL DATA:  Chronic right hip pain.  Patient fell 2 weeks ago. EXAM: MR OF THE RIGHT HIP WITHOUT CONTRAST TECHNIQUE: Multiplanar, multisequence MR imaging was performed. No intravenous contrast was administered. COMPARISON:  Radiographs and CT same date. FINDINGS: Bones: There is no evidence of acute fracture, dislocation or avascular necrosis. The visualized bony pelvis appears normal. The visualized sacroiliac joints and symphysis pubis appear normal. Coronal images demonstrate inferior endplate edema in the L2 vertebral body, probably degenerative. Articular cartilage and labrum Articular cartilage: No focal chondral defect or subchondral signal abnormality identified. Labrum: There is no gross labral tear or paralabral abnormality. Joint or bursal effusion Joint effusion: No significant hip joint effusion. Bursae: No focal periarticular fluid collection. Muscles and tendons Muscles and tendons: The visualized gluteus, hamstring and iliopsoas tendons appear normal. The piriformis muscles appear symmetric. Other findings Miscellaneous: The visualized internal pelvic contents appear unremarkable. IMPRESSION: 1. No evidence of acute hip fracture or dislocation. 2. Inferior endplate edema at L2, only imaged partially in the coronal plane but likely degenerative. Electronically Signed   By: Richardean Sale M.D.   On: 07/28/2017 09:06   Dg Hip Unilat With Pelvis 2-3 Views Right  Result Date: 07/28/2017 CLINICAL DATA:  [place] 2 weeks ago.  RIGHT hip pain. EXAM: DG HIP (WITH OR WITHOUT PELVIS) 2-3V RIGHT COMPARISON:  None. FINDINGS: Habitus limited examination. Faint linear lucency medial RIGHT femoral neck seen on AP view. No dislocation. No destructive bony lesions.  There is no evidence of arthropathy or other focal bone abnormality. IMPRESSION: Habitus limited examination. Linear lucency favoring vascular channel or artifact RIGHT femoral neck, less likely incomplete fracture. Electronically Signed   By: Elon Alas M.D.   On: 07/28/2017 02:33   Dg Femur Min 2 Views Left  Result Date: 07/12/2017 CLINICAL DATA:  Fall.  Pain left lower extremity. EXAM: LEFT FEMUR 2 VIEWS COMPARISON:  No recent prior. FINDINGS: Left femur is intact. Left hip is intact. Severe degenerative changes left knee. Deformity noted the medial tibial plateau, this may be from degenerative change. Subtle fracture cannot be excluded . IMPRESSION: 1.  No acute left femoral abnormality identified. 2. Severe degenerative changes left knee.Deformity noted of the medial tibial plateau. This may be from degenerative change. Subtle fracture cannot be excluded. If need be further evaluation with MRI can be obtained. Electronically Signed   By: Marcello Moores  Register   On: 07/12/2017 17:29    EKG  EKG Interpretation None       Radiology Ct Hip Right Wo Contrast  Result Date: 07/28/2017 CLINICAL DATA:  Status post fall several weeks ago, with persistent sharp right hip pain. Initial encounter. EXAM: CT OF THE RIGHT HIP WITHOUT CONTRAST TECHNIQUE: Multidetector CT imaging of the right hip was performed according to the standard protocol. Multiplanar CT image reconstructions were also generated. COMPARISON:  Right hip radiographs performed earlier today at 2:01 a.m., and CT of the abdomen and pelvis performed 01/27/2013 FINDINGS: Bones/Joint/Cartilage A minimally displaced subcapital fracture through the right femoral neck is suspected. This could be better assessed on MRI, if deemed clinically necessary. The right femoral head remains seated at the acetabulum. No significant hip joint effusion is seen. The cartilage is not well assessed on CT. Vacuum phenomenon is noted at the right sacroiliac joint.  Ligaments Suboptimally assessed by CT. Muscles and Tendons Mild  soft tissue injury is suggested about the right gluteus musculature. Visualized tendon structures are grossly unremarkable. Soft tissues The visualized small and large bowel loops are grossly unremarkable. The bladder is mildly distended and unremarkable in appearance, though incompletely characterized. A mildly prominent right inguinal node is seen, measuring 1.2 cm in short axis. IMPRESSION: 1. Suspect minimally displaced subcapital fracture through the right femoral neck. This could be better assessed on MRI, if deemed clinically necessary. 2. Mild soft tissue injury suggested about the right gluteus musculature. Electronically Signed   By: Garald Balding M.D.   On: 07/28/2017 05:17   Dg Hip Unilat With Pelvis 2-3 Views Right  Result Date: 07/28/2017 CLINICAL DATA:  [place] 2 weeks ago.  RIGHT hip pain. EXAM: DG HIP (WITH OR WITHOUT PELVIS) 2-3V RIGHT COMPARISON:  None. FINDINGS: Habitus limited examination. Faint linear lucency medial RIGHT femoral neck seen on AP view. No dislocation. No destructive bony lesions. There is no evidence of arthropathy or other focal bone abnormality. IMPRESSION: Habitus limited examination. Linear lucency favoring vascular channel or artifact RIGHT femoral neck, less likely incomplete fracture. Electronically Signed   By: Elon Alas M.D.   On: 07/28/2017 02:33    Procedures Procedures (including critical care time)  Medications Ordered in ED Medications  lidocaine (LIDODERM) 5 % 1 patch (1 patch Transdermal Patch Applied 07/28/17 0218)  ketorolac (TORADOL) 30 MG/ML injection 30 mg (30 mg Intramuscular Given 07/28/17 0219)     Initial Impression / Assessment and Plan / ED Course  I have reviewed the triage vital signs and the nursing notes. Case d/w Dr. Doreatha Martin of orthopedics, please obtain MRI and call with results   SX consistent with sciatica but with h/o of fall imaging was obtained and xr  and CT were e Final Clinical Impressions(s) / ED Diagnoses  Signed out to AM team pending MRI    Sciatica: Follow up with your PMD and sports medicine for ongoing care.  Already on narcotic pain medication  New Prescriptions New Prescriptions   No medications on file     Rashema Seawright, MD 07/28/17 [place], Azriel Jakob, MD 07/28/17 1034

## 2017-07-28 NOTE — ED Provider Notes (Signed)
Received care of patient at 7 AM from Dr. Randal Buba.  Briefly, this is a 61 year old female who presented with leg pain.  Initial x-ray ordered in triage concerning for fracture.  CT also starting for possible fracture.  Dr. Joneen Caraway consulted orthopedics who evaluated and did not feel that unlikely this represents fracture and recommend MRI.  MRI pending at transfer care.  MRI negative, suspect likely sciatic etiology of pain.  MR shows no sign of fracture.  Patient provided by Dr. Randal Buba has pain regimen at facility. Patient discharged in stable condition with understanding of reasons to return.    Gareth Morgan, MD 07/29/17 2229

## 2017-07-29 ENCOUNTER — Ambulatory Visit (INDEPENDENT_AMBULATORY_CARE_PROVIDER_SITE_OTHER): Payer: PRIVATE HEALTH INSURANCE

## 2017-07-29 ENCOUNTER — Ambulatory Visit (INDEPENDENT_AMBULATORY_CARE_PROVIDER_SITE_OTHER): Payer: Medicare Other | Admitting: Orthopedic Surgery

## 2017-07-29 ENCOUNTER — Encounter (INDEPENDENT_AMBULATORY_CARE_PROVIDER_SITE_OTHER): Payer: Self-pay | Admitting: Orthopedic Surgery

## 2017-07-29 DIAGNOSIS — S42214D Unspecified nondisplaced fracture of surgical neck of right humerus, subsequent encounter for fracture with routine healing: Secondary | ICD-10-CM

## 2017-07-30 NOTE — Progress Notes (Signed)
Post-Op Visit Note   Patient: Whitney [name]           Date of Birth: 1955/11/21           MRN: [id number] Visit Date: 07/29/2017 PCP: Darcus Austin, MD   Assessment & Plan:  Chief Complaint:  Chief Complaint  Patient presents with  . Right Shoulder - Injury   Visit Diagnoses:  1. Closed nondisplaced fracture of surgical neck of right humerus with routine healing, unspecified fracture morphology, subsequent encounter     Plan: Sherrie is a patient now 2 weeks out right proximal humerus fracture.  The KITCHEN floor.  She lives alone.  She is in a rehabilitation facility.  On examination today the fracture does appear to be moving as a unit.  Motor sensory function to the hand is intact she is in a sling.  Radiographs don't show much in change of alignment.  Plan at this time is observation continued sling use repeat radiographs and clinical evaluation 2 weeks.  The fracture still feels good then we will begin more aggressive pendulum range of motion exercises.  Until now or until then continue with just elbow range of motion only 2-3 times a day and no lifting with the right arm  Follow-Up Instructions: Return in about 2 weeks (around 08/12/2017).   Orders:  Orders Placed This Encounter  Procedures  . XR Shoulder Right   No orders of the defined types were placed in this encounter.   Imaging: No results found.  PMFS History: Patient Active Problem List   Diagnosis Date Noted  . Acute right lumbar radiculopathy 07/25/2017  . At risk for adverse drug event 07/16/2017  . Humerus fracture 07/12/2017  . Morbid obesity due to excess calories ([place]) 12/14/2015  . Chronic systolic heart failure ([place]) 12/14/2015  . Chest pain 12/14/2015  . Angina decubitus ([place]) 12/14/2015  . Adiposity 01/06/2013  . Arthritis of knee, degenerative 01/06/2013  . OSA (obstructive sleep apnea) 11/30/2011  . Cardiac arrest ([place]) 11/13/2011  . Diabetes mellitus type 2, uncontrolled ([place]) 11/13/2011    Past Medical History:  Diagnosis Date  . Arthritis    left knee,right hip  . Cardiac arrest ([place])   . Depression   . Diabetes mellitus   . GERD (gastroesophageal reflux disease)   . Glaucoma   . H/O cardiac arrest 11/2011  . Heart murmur   . Hyperlipidemia   . Hypertension   . Myocardial infarction ([place]) 2013  . Sleep apnea    Bring machine,mask and tubing    Family History  Problem Relation Age of Onset  . Asthma Mother     Past Surgical History:  Procedure Laterality Date  . BACK SURGERY    . COLONOSCOPY N/A 09/12/2016   Procedure: COLONOSCOPY;  Surgeon: Clarene Essex, MD;  Location[facility];  Service: Endoscopy;  Laterality: N/A;  . CYSTOSCOPY W/ URETERAL STENT PLACEMENT Left 01/04/2013   Procedure: CYSTOSCOPY WITH RETROGRADE PYELOGRAM/URETERAL STENT PLACEMENT;  Surgeon: Bernestine Amass, MD;  Location: [facility];  Service: Urology;  Laterality: Left;  . CYSTOSCOPY WITH RETROGRADE PYELOGRAM, URETEROSCOPY AND STENT PLACEMENT Left 01/26/2013   Procedure: CYSTOSCOPY, JJ STENT REMOVAL, LEFT URETEROSCOPY, ;  Surgeon: Bernestine Amass, MD;  Location: [facility];  Service: Urology;  Laterality: Left;  CYSTO, JJ STENT REMOVAL, LEFT URETEROSCOPY   . FOOT SURGERY    . HERNIA REPAIR    . HOLMIUM LASER APPLICATION Left 7/98/9211   Procedure: HOLMIUM LASER LITHOTRIPSY ;  Surgeon: Camelia Eng  Risa Grill, MD;  Location: [facility];  Service: Urology;  Laterality: Left;  . KNEE SURGERY     left  . PARTIAL HYSTERECTOMY     Social History   Occupational History  . Not on file.   Social History Main Topics  . Smoking status: Never Smoker  . Smokeless tobacco: Never Used  . Alcohol use No  . Drug use: No  . Sexual activity: No

## 2017-07-31 ENCOUNTER — Other Ambulatory Visit: Payer: Self-pay | Admitting: *Deleted

## 2017-07-31 ENCOUNTER — Ambulatory Visit: Payer: Medicare Other | Admitting: Podiatry

## 2017-07-31 DIAGNOSIS — E1165 Type 2 diabetes mellitus with hyperglycemia: Secondary | ICD-10-CM

## 2017-07-31 NOTE — Patient Outreach (Signed)
[facility]) Care Management  07/31/2017  Whitney [name] 1956-06-30 [id number]  Met with patient to follow up on [facility] program.  Patient reports she has had a lot going on, she had a rehospitalization. Patient reports she may go home this weekend, but does not feel ready.  She is worried about the twenty day co pay.   Patient reports she is still not able to use her right arm due to her shoulder it is in a sling.  She states she is right handed. Patient reports she lives alone, her closest family is a brother and sister but they both live out of town.   RNCM reviewed [facility] program.  Patient reports she needs as much help as possible.  Consent obtained.  RNCM will refer to [facility] LCSW to review community resources: Home care providers, meals, transportation to see what patient may qualify for upon discharge.  Royetta Crochet. Laymond Purser, RN, BSN, [place] [phone]) Business Cell  [phone]) Toll Free Office

## 2017-08-01 ENCOUNTER — Other Ambulatory Visit: Payer: Self-pay | Admitting: Licensed Clinical Social Worker

## 2017-08-01 NOTE — Patient Outreach (Addendum)
[facility]) Care Management  [facility] Social Work  08/01/2017  Whitney [name] 18-Aug-1956 [id number]   Encounter Medications:  Outpatient Encounter Prescriptions as of 08/01/2017  Medication Sig  . acetaminophen (TYLENOL) 500 MG tablet Take 500 mg by mouth every 6 (six) hours as needed.  Marland Kitchen amLODipine (NORVASC) 5 MG tablet Take 5 mg by mouth daily.  Marland Kitchen aspirin 325 MG tablet Take 1 tablet (325 mg total) by mouth at bedtime.  . betamethasone dipropionate (DIPROLENE) 0.05 % cream Apply 1 application topically 2 (two) times daily.  . bimatoprost (LUMIGAN) 0.01 % SOLN Place 1 drop into both eyes at bedtime.  . clobetasol ointment (TEMOVATE) 4.09 % 1 APPLICATION TWICE A DAY, AVOID OPEN WOUNDS AS NEEDED EXTERNALLY  . cyclobenzaprine (FLEXERIL) 5 MG tablet Take 1 tablet (5 mg total) by mouth 3 (three) times daily as needed for muscle spasms.  . furosemide (LASIX) 20 MG tablet Take 1 tablet (20 mg total) by mouth daily.  Marland Kitchen glipiZIDE (GLUCOTROL XL) 10 MG 24 hr tablet Take 10 mg by mouth daily.  Marland Kitchen HYDROcodone-acetaminophen (NORCO/VICODIN) 5-325 MG tablet Take 1 tablet by mouth every 6 (six) hours as needed for moderate pain or severe pain.  Marland Kitchen ibuprofen (ADVIL,MOTRIN) 400 MG tablet Take 1 tablet (400 mg total) by mouth every 8 (eight) hours as needed for moderate pain.  Marland Kitchen lisinopril (PRINIVIL,ZESTRIL) 20 MG tablet Take 20 mg by mouth daily before breakfast.   . magnesium hydroxide (MILK OF MAGNESIA) 400 MG/5ML suspension Take 15 mLs by mouth daily as needed for mild constipation.  . metoprolol succinate (TOPROL-XL) 100 MG 24 hr tablet Take 100 mg by mouth daily before breakfast. Take with or immediately following a meal.  . ONGLYZA 5 MG TABS tablet Take 5 mg by mouth daily.  Marland Kitchen oxybutynin (DITROPAN) 5 MG tablet Take 5 mg by mouth daily.  . pantoprazole (PROTONIX) 40 MG tablet Take 40 mg by mouth daily.  . potassium chloride (K-DUR) 10 MEQ tablet Take 10 mEq by mouth daily.  . pravastatin  (PRAVACHOL) 40 MG tablet Take 40 mg by mouth at bedtime.   . traMADol (ULTRAM) 50 MG tablet Take 100 mg by mouth every 8 (eight) hours as needed for severe pain.   No facility-administered encounter medications on file as of 08/01/2017.     Functional Status:  In your present state of health, do you have any difficulty performing the following activities: 08/01/2017  Hearing? N  Vision? Y  Difficulty concentrating or making decisions? N  Walking or climbing stairs? Y  Dressing or bathing? Y  Doing errands, shopping? Y  Some recent data might be hidden    Fall/Depression Screening:  PHQ 2/9 Scores 08/01/2017  PHQ - 2 Score 4  PHQ- 9 Score 12    Assessment: CSW arrived at [facility] on 08/01/17 to complete SNF visit. Patient was in her room with a physical therapist when [place] arrived. CSW introduced self, reason for visit and of [place] social work services. Patient is agreeable to services. Patient's PT is very concerned about patient returning home on 08/04/17 and is hoping that patient will reconsider staying longer became emotional and tearful stating that she did not want to a burden to anyone and wants to return home but understands she is not able to take care of herself yet. She understands that CSW will make referral to [facility] and that they will arrange for an aide to come out to her home a few hours a  day for 30 days but that she will still need more support within the home to ensure a safe discharge from SNF. CSW completed call to patient's brother per her request and patient, brother and CSW discussed our concerns with her returning home just yet. Patient's brother informed patient that "There is no price tag on your health and well-being and we are happy to help you out with payments. We just don't want you to go home and get hurt and end up back in worse shape than you were to begin with." Patient finally agreed to stay longer than 08/04/17 and wishes for CSW to notify staff  at [facility]. CSW completed FAF and Giving Committee Form so that when patient discharges from SNF, [facility] will be able to set up services quickly. Patient reports that she was fully independent before her fall and hopes to be able to get back that way after her recovery. 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 Patient was very appreciative of visit and appreciative of the emotional support given during today's visit as well.   CSW went to SNF social worker's office but she was not there. CSW contacted her a left a message stating update that patient is now agreeable to stay at facility longer.  CSW went to SNF admissions office but was not able to meet with Whitney [name]. CSW completed call and left a message with her as well and provided update that patient is now agreeable to stay at facility longer.  CSW received a return call from SNF social worker [place] later in the day. She reports that SNF does not wish for patient to return either as they are fully aware that patient would not be safe to return yet. SNF social worker reports that had a care team planning meeting yesterday and had brother on the phone and were all able to convince patient to consider staying longer at facility as well. Co-pay will be $166 per day and patient will set up a payment plan to assist with this.   CSW completed call to [facility] and provided update that patient will not be discharging home on 08/04/17. [facility] will keep referral information and await to hear back from [facility] CSW once a discharge date has been scheduled so that they can start services.   Plan: CSW will route encounter to [place] Acute Coordinator and PCP. CSW will follow up within one week.  Whitney [name], BSW, MSW, [email or web address] Phone:  [phone] Fax: [phone]

## 2017-08-01 NOTE — Patient Outreach (Signed)
[facility]) Care Management  08/01/2017  KITZIA CAMUS 11/16/1955 [id number]  Assessment- CSW received new referral on patient. Patient is currently at [facility] and will be discharging on 08/04/17. [place] Coordinator recommends that we use Home Care Provider contract to assist patient with her transition back home from SNF. Patient broke her right shoulder and is right handed and is still in a sling. Patient will have difficulty transitioning back home from SNF due to this injury. Patient has limited support within the home. CSW completed call to [place] Coordinator on 07/31/17 and gain further updates.  CSW completed call to [place] [facility] and completed referral to program as [place] has already approved this request per [place] Coordinator [facility]. CSW sent a secure email with patient's medications, diagnosis list and demographics per Home Care Provider's request.   CSW will complete SNF visit today and complete required paperwork for Home Care Provider referral.  Plan-CSW will send involvement letter to PCP and complete SNF visit today.  Eula Fried, BSW, MSW, [email or web address] Phone: [phone] Fax: [phone]

## 2017-08-05 ENCOUNTER — Other Ambulatory Visit: Payer: Self-pay | Admitting: Licensed Clinical Social Worker

## 2017-08-06 ENCOUNTER — Other Ambulatory Visit: Payer: Self-pay

## 2017-08-06 ENCOUNTER — Other Ambulatory Visit: Payer: Self-pay | Admitting: Licensed Clinical Social Worker

## 2017-08-06 MED ORDER — HYDROCODONE-ACETAMINOPHEN 5-325 MG PO TABS: 1.0000 | ORAL_TABLET | Freq: Four times a day (QID) | ORAL | 0 refills | Status: DC | PRN

## 2017-08-06 NOTE — Patient Outreach (Signed)
[facility]) Care Management  [facility] Social Work  08/06/2017  Whitney [name] 09/02/1956 [id number]  Encounter Medications:  Outpatient Encounter Medications as of 08/06/2017  Medication Sig  . acetaminophen (TYLENOL) 500 MG tablet Take 500 mg by mouth every 6 (six) hours as needed.  . amLODipine (NORVASC) 5 MG tablet Take 5 mg by mouth daily.  . aspirin 325 MG tablet Take 1 tablet (325 mg total) by mouth at bedtime.  . betamethasone dipropionate (DIPROLENE) 0.05 % cream Apply 1 application topically 2 (two) times daily.  . bimatoprost (LUMIGAN) 0.01 % SOLN Place 1 drop into both eyes at bedtime.  . clobetasol ointment (TEMOVATE) 0.05 % 1 APPLICATION TWICE A DAY, AVOID OPEN WOUNDS AS NEEDED EXTERNALLY  . cyclobenzaprine (FLEXERIL) 5 MG tablet Take 1 tablet (5 mg total) by mouth 3 (three) times daily as needed for muscle spasms.  . furosemide (LASIX) 20 MG tablet Take 1 tablet (20 mg total) by mouth daily.  . glipiZIDE (GLUCOTROL XL) 10 MG 24 hr tablet Take 10 mg by mouth daily.  . HYDROcodone-acetaminophen (NORCO/VICODIN) 5-325 MG tablet Take 1 tablet by mouth every 6 (six) hours as needed for moderate pain or severe pain.  . ibuprofen (ADVIL,MOTRIN) 400 MG tablet Take 1 tablet (400 mg total) by mouth every 8 (eight) hours as needed for moderate pain.  . lisinopril (PRINIVIL,ZESTRIL) 20 MG tablet Take 20 mg by mouth daily before breakfast.   . magnesium hydroxide (MILK OF MAGNESIA) 400 MG/5ML suspension Take 15 mLs by mouth daily as needed for mild constipation.  . metoprolol succinate (TOPROL-XL) 100 MG 24 hr tablet Take 100 mg by mouth daily before breakfast. Take with or immediately following a meal.  . ONGLYZA 5 MG TABS tablet Take 5 mg by mouth daily.  . oxybutynin (DITROPAN) 5 MG tablet Take 5 mg by mouth daily.  . pantoprazole (PROTONIX) 40 MG tablet Take 40 mg by mouth daily.  . potassium chloride (K-DUR) 10 MEQ tablet Take 10 mEq by mouth daily.  . pravastatin  (PRAVACHOL) 40 MG tablet Take 40 mg by mouth at bedtime.   . traMADol (ULTRAM) 50 MG tablet Take 100 mg by mouth every 8 (eight) hours as needed for severe pain.   No facility-administered encounter medications on file as of 08/06/2017.     Functional Status:  In your present state of health, do you have any difficulty performing the following activities: 08/01/2017  Hearing? N  Vision? Y  Difficulty concentrating or making decisions? N  Walking or climbing stairs? Y  Dressing or bathing? Y  Doing errands, shopping? Y  Some recent data might be hidden    Fall/Depression Screening:  PHQ 2/9 Scores 08/01/2017  PHQ - 2 Score 4  PHQ- 9 Score 12    Assessment: CSW completed SNF visit with patient on 08/06/17. Patient reports that she is doing "better." She shares that she still cannot get up but she has made some progress with her right hand. She reports that she has been able to write with her hand some. She states that she is not experiencing as much pain anymore either. Patient reports that she was able to go to the bathroom on her own one day this week but that it wasn't safe her to do so. She states that she has waited long periods of time for an aide to come to her and assist her with using the restroom. Patient reports that she has several complaints with facility but is trying to   remain positive and is aware that her situation is only temporary. Patient reports that the facility forgot to bring her dinner one night and that they continue to feed her items that she cannot eat. She reports that her sugar has been in the 400's and 300's and she feels in some ways she is causing herself harm by staying at SNF longer. However, patient was very receptive to CSW's interventions provided during visit. CSW practiced "playing the tape through" in order to view the bigger picture and her reasoning for staying at SNF longer. CSW educated patent on how to file a grievance with facility as well. Patient  questioned if CSW would be able to provide her with an aide for 30 days and CSW confirmed this. CSW provided patient with an extra business card and encouraged patient to contact her once a discharge date has been decided so that [facility] can be informed. Patient reports that she is "ready to go back home." She states that she has a walk in shower, grabs by her toilet and shower and she feels comfortable discharging back home from SNF once it is time. She shares that she is going to wait until medical appointment on 08/12/17 and then will determine when she is ready to be discharged home.   CSW unable to meet with SNF social worker as she was not available.  [facility] CM Care Plan Problem One     Most Recent Value  Care Plan Problem One  SNF admission and lack of support within the home to establish a safe and stable transition back home  Role Documenting the Problem One  Clinical Social Worker  Care Plan for Problem One  Active  [facility] Long Term Goal   Patient will have a safe and stable discharge back home from SNF within 90 days  [facility] Long Term Goal Start Date  08/01/17  Interventions for Problem One Long Term Goal  Expected discharge date was pushed back. Patient is now at SNF and paying each day privately until her medical appointment on 08/12/17. CSW met with patient today and provide support and education.  [facility] CM Short Term Goal #1   Patient will gain aides through [facility] for 30 days to assist with transition back home from SNF  [facility] CM Short Term Goal #1 Start Date  08/01/17  Interventions for Short Term Goal #1  Discharge was changed but patient will still need to gain an aide through our contract for 30 days. CSW will monitor case closely and await for news of expected discharge in order to complete referral to [facility].     Plan: CSW will follow up with SNF within one week.   , BSW, MSW, LCSW [facility] Care  [email or web address] Phone: [phone] Fax: [phone]    

## 2017-08-06 NOTE — Telephone Encounter (Signed)
Faxed to [facility] [phone]  Dian Situ diagonal line through order after faxing and put in pharmacy tote.  Checked [place] narcotic database.

## 2017-08-07 ENCOUNTER — Encounter: Payer: Self-pay | Admitting: Adult Health

## 2017-08-07 ENCOUNTER — Non-Acute Institutional Stay (SKILLED_NURSING_FACILITY): Payer: Medicare Other | Admitting: Adult Health

## 2017-08-07 DIAGNOSIS — E1165 Type 2 diabetes mellitus with hyperglycemia: Secondary | ICD-10-CM | POA: Diagnosis not present

## 2017-08-07 NOTE — Progress Notes (Signed)
DATE:  08/07/2017   MRN:  [id number]  BIRTHDAY: 01/23/1956  Facility:  Nursing Home Location:  [facility] Room Number: 213-A  LEVEL OF CARE:  SNF [phone])  Contact Information    Name Relation Home Work [place] Brother [phone]  [phone]   Harris,Carol Sister [phone]  [phone]       Code Status History    Date Active Date Inactive Code Status Order ID Comments User Context   07/12/2017 20:28 07/15/2017 19:44 Full Code [id number]  Merton Border, MD Inpatient       Chief Complaint  Patient presents with  . Acute Visit    Elevated blood sugars    HISTORY OF PRESENT ILLNESS:  This is a 32-YO female seen for an acute visit secondary to elevated blood sugars.  She is a short-term rehabilitation resident of [facility].  She has a PMH of CAD with ischemic cardiomyopathy, type 2 DM, sleep apnea, dyslipidemia, and HTN. CBGs noted to be elevated - 352, 341, 430, 252, 268. She was seen in the room today.     PAST MEDICAL HISTORY:  Past Medical History:  Diagnosis Date  . Arthritis    left knee,right hip  . Cardiac arrest ([place])   . Depression   . Diabetes mellitus   . GERD (gastroesophageal reflux disease)   . Glaucoma   . H/O cardiac arrest 11/2011  . Heart murmur   . Hyperlipidemia   . Hypertension   . Myocardial infarction ([place]) 2013  . Sleep apnea    Bring machine,mask and tubing     CURRENT MEDICATIONS: Reviewed    Medication List        Accurate as of 08/07/17  3:35 PM. Always use your most recent med list.          acetaminophen 500 MG tablet Commonly known as:  TYLENOL   amLODipine 5 MG tablet Commonly known as:  NORVASC   aspirin 325 MG tablet Take 1 tablet (325 mg total) by mouth at bedtime.   cyclobenzaprine 5 MG tablet Commonly known as:  FLEXERIL Take 1 tablet (5 mg total) by mouth 3 (three) times daily as needed for muscle spasms.   furosemide 20 MG  tablet Commonly known as:  LASIX Take 1 tablet (20 mg total) by mouth daily.   gabapentin 100 MG capsule Commonly known as:  NEURONTIN   HYDROcodone-acetaminophen 5-325 MG tablet Commonly known as:  NORCO/VICODIN Take 1 tablet every 6 (six) hours as needed by mouth for moderate pain or severe pain.   ibuprofen 400 MG tablet Commonly known as:  ADVIL,MOTRIN Take 1 tablet (400 mg total) by mouth every 8 (eight) hours as needed for moderate pain.   LANTUS SOLOSTAR 100 UNIT/ML Solostar Pen Generic drug:  Insulin Glargine   lisinopril 20 MG tablet Commonly known as:  PRINIVIL,ZESTRIL   LUMIGAN 0.01 % Soln Generic drug:  bimatoprost   magnesium hydroxide 400 MG/5ML suspension Commonly known as:  MILK OF MAGNESIA   metoprolol succinate 100 MG 24 hr tablet Commonly known as:  TOPROL-XL   ONGLYZA 5 MG Tabs tablet Generic drug:  saxagliptin HCl   oxybutynin 5 MG tablet Commonly known as:  DITROPAN   pantoprazole 40 MG tablet Commonly known as:  PROTONIX   potassium chloride 10 MEQ tablet Commonly known as:  K-DUR   pravastatin 40 MG tablet Commonly known as:  PRAVACHOL   traMADol 50 MG tablet Commonly known as:  Veatrice Bourbon  Allergies  Allergen Reactions  . Contrast Media [Iodinated Diagnostic Agents] Anaphylaxis     REVIEW OF SYSTEMS:  GENERAL: no change in appetite, no fatigue, no weight changes, no fever, chills or weakness MOUTH and THROAT: Denies oral discomfort, gingival pain or bleeding RESPIRATORY: no cough, SOB, DOE, wheezing, hemoptysis CARDIAC: no chest pain, edema or palpitations GI: no abdominal pain, diarrhea, constipation, heart burn, nausea or vomiting GU: Denies dysuria, frequency, hematuria, incontinence, or discharge PSYCHIATRIC: Denies feeling of depression or anxiety. No report of hallucinations, insomnia, paranoia, or agitation    PHYSICAL EXAMINATION  GENERAL APPEARANCE: Well nourished. In no acute distress. Morbidly obese SKIN:   Skin is warm and dry.  MOUTH and THROAT: Lips are without lesions. Oral mucosa is moist and without lesions.  RESPIRATORY: breathing is even & unlabored, BS CTAB CARDIAC: RRR, no murmur,no extra heart sounds, no edema GI: abdomen soft, normal BS, no masses, no tenderness, no hepatomegaly, no splenomegaly EXTREMITIES:  Able to move X 4 extremities, right arm on sling PSYCHIATRIC: Alert and oriented X 3. Affect and behavior are appropriate   LABS/RADIOLOGY: Labs reviewed: Basic Metabolic Panel: Recent Labs    07/13/17 0302 07/14/17 0535 07/15/17 0335 07/28/17 0626  NA 139 138 139 138  K 3.2* 3.6 3.6 4.2  CL 106 104 104 102  CO2 23 27 28   --   GLUCOSE 188* 237* 212* 271*  BUN 5* 8 8 18   CREATININE 0.50 0.52 0.50 0.60  CALCIUM 8.8* 8.6* 8.5*  --     CBC: Recent Labs    07/12/17 1525 07/28/17 0612 07/28/17 0626  WBC 15.9* 10.0  --   NEUTROABS  --  6.6  --   HGB 11.8* 11.7* 12.6  HCT 37.2 37.2 37.0  MCV 82.7 83.2  --   PLT 236 329  --    Cardiac Enzymes: Recent Labs    07/12/17 1525 07/13/17 0302  CKTOTAL 1,452* 677*   CBG: Recent Labs    07/15/17 0624 07/15/17 1156 07/15/17 1604  GLUCAP 180* 305* 281*      Dg Chest 1 View  Result Date: 07/12/2017 CLINICAL DATA:  [place] onto right shoulder. EXAM: CHEST 1 VIEW COMPARISON:  01/04/2013 FINDINGS: Lungs are clear. Heart is mildly enlarged but may be accentuated by the AP supine technique. Negative for a pneumothorax. Bridging osteophytes along the right side of thoracic spine. Fracture involving the proximal right humerus. Linear density along the mid left clavicle could be related to overlying scapula but this area is incompletely evaluated. IMPRESSION: No acute cardiopulmonary disease. Fracture of the proximal right humerus. Incomplete evaluation of the left clavicle. If there is concern for an injury in this area, recommend dedicated images. Electronically Signed   By: Markus Daft M.D.   On: 07/12/2017 17:28   Dg  Chest 2 View  Result Date: 07/28/2017 CLINICAL DATA:  Status post fall 2 weeks ago, with concern for chest injury. Initial encounter. EXAM: CHEST  2 VIEW COMPARISON:  Chest radiograph performed 07/12/2017 FINDINGS: The lungs are well-aerated and clear. There is no evidence of focal opacification, pleural effusion or pneumothorax. The heart is mildly enlarged. There is a comminuted and mildly displaced fracture of the right humeral head and neck. IMPRESSION: 1. Comminuted and mildly displaced fracture of the right humeral head and neck, as previously noted. 2. Mild cardiomegaly.  Lungs remain grossly clear. Electronically Signed   By: Garald Balding M.D.   On: 07/28/2017 06:55   Dg Shoulder Right  Result Date: 07/12/2017  CLINICAL DATA:  Fall.  Right shoulder pain. EXAM: RIGHT SHOULDER - 2+ VIEW COMPARISON:  No recent prior. FINDINGS: Displaced comminuted fracture of the proximal right humeral neck noted. No evidence of dislocation . Acromioclavicular degenerative change present. IMPRESSION: Displaced comminuted fracture of the proximal right humeral neck. Electronically Signed   By: Marcello Moores  Register   On: 07/12/2017 17:26   Dg Tibia/fibula Left  Result Date: 07/12/2017 CLINICAL DATA:  Left leg pain post fall. EXAM: LEFT TIBIA AND FIBULA - 2 VIEW COMPARISON:  None. FINDINGS: There is no evidence of fracture. Suboptimal visualization of the left knee severe osteoarthritic changes with joint space loss, subchondral sclerosis, remodeling and cyst formation an osteophyte formation. Soft tissues are unremarkable. IMPRESSION: No definite evidence of fracture. Suboptimal visualization of the left knee, which demonstrates 3 compartment severe osteoarthritic changes. Electronically Signed   By: Fidela Salisbury M.D.   On: 07/12/2017 17:28   Ct Shoulder Right Wo Contrast  Result Date: 07/12/2017 CLINICAL DATA:  Right humerus fracture due to a fall today. Initial encounter. EXAM: CT OF THE UPPER RIGHT  EXTREMITY WITHOUT CONTRAST TECHNIQUE: Multidetector CT imaging of the upper right extremity was performed according to the standard protocol. COMPARISON:  Plain films right shoulder earlier today. FINDINGS: Bones/Joint/Cartilage As seen on the comparison plain films, the patient has a mildly impacted surgical neck fracture of the right humerus. The fracture shows slight medial displacement. The lesser tuberosity is spared. The fracture involves the greater tuberosity which is mildly comminuted and posteriorly and inferiorly displaced. The humeral head is located and the acromioclavicular joint is intact. No other fracture is identified. No lytic or sclerotic bony lesion. Ligaments Suboptimally assessed by CT. Muscles and Tendons The rotator cuff appears intact. Soft tissues Imaged lung parenchyma is clear. IMPRESSION: Mildly impacted and medially displaced surgical neck fracture of the right humerus extends into the greater tuberosity which is mildly comminuted and displaced. Acromioclavicular osteoarthritis. Electronically Signed   By: Inge Rise M.D.   On: 07/12/2017 18:52   Ct Hip Right Wo Contrast  Result Date: 07/28/2017 CLINICAL DATA:  Status post fall several weeks ago, with persistent sharp right hip pain. Initial encounter. EXAM: CT OF THE RIGHT HIP WITHOUT CONTRAST TECHNIQUE: Multidetector CT imaging of the right hip was performed according to the standard protocol. Multiplanar CT image reconstructions were also generated. COMPARISON:  Right hip radiographs performed earlier today at 2:01 a.m., and CT of the abdomen and pelvis performed 01/27/2013 FINDINGS: Bones/Joint/Cartilage A minimally displaced subcapital fracture through the right femoral neck is suspected. This could be better assessed on MRI, if deemed clinically necessary. The right femoral head remains seated at the acetabulum. No significant hip joint effusion is seen. The cartilage is not well assessed on CT. Vacuum phenomenon is  noted at the right sacroiliac joint. Ligaments Suboptimally assessed by CT. Muscles and Tendons Mild soft tissue injury is suggested about the right gluteus musculature. Visualized tendon structures are grossly unremarkable. Soft tissues The visualized small and large bowel loops are grossly unremarkable. The bladder is mildly distended and unremarkable in appearance, though incompletely characterized. A mildly prominent right inguinal node is seen, measuring 1.2 cm in short axis. IMPRESSION: 1. Suspect minimally displaced subcapital fracture through the right femoral neck. This could be better assessed on MRI, if deemed clinically necessary. 2. Mild soft tissue injury suggested about the right gluteus musculature. Electronically Signed   By: Garald Balding M.D.   On: 07/28/2017 05:17   Mr Hip Right Wo Contrast  Result Date: 07/28/2017 CLINICAL DATA:  Chronic right hip pain.  Patient fell 2 weeks ago. EXAM: MR OF THE RIGHT HIP WITHOUT CONTRAST TECHNIQUE: Multiplanar, multisequence MR imaging was performed. No intravenous contrast was administered. COMPARISON:  Radiographs and CT same date. FINDINGS: Bones: There is no evidence of acute fracture, dislocation or avascular necrosis. The visualized bony pelvis appears normal. The visualized sacroiliac joints and symphysis pubis appear normal. Coronal images demonstrate inferior endplate edema in the L2 vertebral body, probably degenerative. Articular cartilage and labrum Articular cartilage: No focal chondral defect or subchondral signal abnormality identified. Labrum: There is no gross labral tear or paralabral abnormality. Joint or bursal effusion Joint effusion: No significant hip joint effusion. Bursae: No focal periarticular fluid collection. Muscles and tendons Muscles and tendons: The visualized gluteus, hamstring and iliopsoas tendons appear normal. The piriformis muscles appear symmetric. Other findings Miscellaneous: The visualized internal pelvic  contents appear unremarkable. IMPRESSION: 1. No evidence of acute hip fracture or dislocation. 2. Inferior endplate edema at L2, only imaged partially in the coronal plane but likely degenerative. Electronically Signed   By: Richardean Sale M.D.   On: 07/28/2017 09:06   Dg Hip Unilat With Pelvis 2-3 Views Right  Result Date: 07/28/2017 CLINICAL DATA:  [place] 2 weeks ago.  RIGHT hip pain. EXAM: DG HIP (WITH OR WITHOUT PELVIS) 2-3V RIGHT COMPARISON:  None. FINDINGS: Habitus limited examination. Faint linear lucency medial RIGHT femoral neck seen on AP view. No dislocation. No destructive bony lesions. There is no evidence of arthropathy or other focal bone abnormality. IMPRESSION: Habitus limited examination. Linear lucency favoring vascular channel or artifact RIGHT femoral neck, less likely incomplete fracture. Electronically Signed   By: Elon Alas M.D.   On: 07/28/2017 02:33   Dg Femur Min 2 Views Left  Result Date: 07/12/2017 CLINICAL DATA:  Fall.  Pain left lower extremity. EXAM: LEFT FEMUR 2 VIEWS COMPARISON:  No recent prior. FINDINGS: Left femur is intact. Left hip is intact. Severe degenerative changes left knee. Deformity noted the medial tibial plateau, this may be from degenerative change. Subtle fracture cannot be excluded . IMPRESSION: 1.  No acute left femoral abnormality identified. 2. Severe degenerative changes left knee.Deformity noted of the medial tibial plateau. This may be from degenerative change. Subtle fracture cannot be excluded. If need be further evaluation with MRI can be obtained. Electronically Signed   By: Marcello Moores  Register   On: 07/12/2017 17:29   Xr Shoulder Right  Result Date: 07/30/2017 2 views right proximal humerus demonstrates proximal humerus fracture unchanged in alignment from previous radial grafts.  The head is located.  It is about 30% medial shift of the shaft in relation to the head.   ASSESSMENT/PLAN:  1. Uncontrolled type 2 diabetes mellitus  with hyperglycemia (HCC) - discontinue Lantus 12 units daily at bedtime, start Lantus 100 units/mL inject 18 units subcutaneous daily at bedtime Lab Results  Component Value Date   HGBA1C 7.6 (H) 07/12/2017      Monina C. [place] - NP   [employer] [phone]

## 2017-08-12 ENCOUNTER — Ambulatory Visit (INDEPENDENT_AMBULATORY_CARE_PROVIDER_SITE_OTHER): Payer: PRIVATE HEALTH INSURANCE | Admitting: Orthopedic Surgery

## 2017-08-12 ENCOUNTER — Ambulatory Visit (INDEPENDENT_AMBULATORY_CARE_PROVIDER_SITE_OTHER): Payer: PRIVATE HEALTH INSURANCE

## 2017-08-12 ENCOUNTER — Other Ambulatory Visit: Payer: Self-pay | Admitting: Licensed Clinical Social Worker

## 2017-08-12 ENCOUNTER — Encounter (INDEPENDENT_AMBULATORY_CARE_PROVIDER_SITE_OTHER): Payer: Self-pay | Admitting: Orthopedic Surgery

## 2017-08-12 DIAGNOSIS — S42214D Unspecified nondisplaced fracture of surgical neck of right humerus, subsequent encounter for fracture with routine healing: Secondary | ICD-10-CM

## 2017-08-12 NOTE — Patient Outreach (Signed)
[place] [facility]) Care Management  08/12/2017  Whitney [name] [birth year] [id number]  Assessment- CSW received voice message from patient on 08/12/17 stating that she would attend her follow up appointment at [facility] today and that she will contact this CSW back with updates. Patient is still unsure when she will leave SNF but is hopeful physician can help guide her in making the right choice. CSW completed call back to patient's room at [facility] but was unable to reach her and was unable to leave a voice message.   Plan-CSW will await to hear back from patient. CSW will continue to follow patient and provide social work assistance.  Eula Fried, BSW, MSW, [email or web address] Phone: [phone] Fax: [phone]

## 2017-08-13 ENCOUNTER — Other Ambulatory Visit: Payer: Self-pay | Admitting: Licensed Clinical Social Worker

## 2017-08-13 NOTE — Patient Outreach (Signed)
[place] [facility]) Care Management  08/13/2017  Whitney [name] 07/27/1956 [id number]  Assessment- CSW completed outreach call to [facility], [facility] at [place] but was unable to reach her successfully. CSW left a HIPPA compliant voice message encouraging a return call with updates in regards to expected discharge date.  Plan-CSW will make order to [facility] once discharge date is known.  Eula Fried, BSW, MSW, [email or web address] Phone: [phone] Fax: [phone]

## 2017-08-14 ENCOUNTER — Other Ambulatory Visit: Payer: Self-pay | Admitting: Licensed Clinical Social Worker

## 2017-08-14 NOTE — Progress Notes (Signed)
   Post-Op Visit Note   Patient: Whitney [name]           Date of Birth: 1955/12/19           MRN: [id number] Visit Date: 08/12/2017 PCP: Darcus Austin, MD   Assessment & Plan:  Chief Complaint:  Chief Complaint  Patient presents with  . Right Shoulder - Follow-up, Fracture   Visit Diagnoses:  1. Closed nondisplaced fracture of surgical neck of right humerus with routine healing, unspecified fracture morphology, subsequent encounter     Plan: [name] is a 61 year old patient with right shoulder proximal humerus fracture.  On examination the fracture is moving as a unit.  Radiographs show no further displacement.  Plan at this time is to let her start doing some pendulum range of motion exercises but I do not want her weightbearing to that right arm on a walker for at least 2 weeks.  At that time she can start weightbearing to the walker.  I'll see her back in 4 weeks for clinical recheck and likely release.  We will need more x-rays on return  Follow-Up Instructions: Return in about 4 weeks (around 09/09/2017).   Orders:  Orders Placed This Encounter  Procedures  . XR Shoulder Right   No orders of the defined types were placed in this encounter.   Imaging: No results found.  PMFS History: Patient Active Problem List   Diagnosis Date Noted  . Acute right lumbar radiculopathy 07/25/2017  . At risk for adverse drug event 07/16/2017  . Humerus fracture 07/12/2017  . Morbid obesity due to excess calories ([place]) 12/14/2015  . Chronic systolic heart failure ([place]) 12/14/2015  . Chest pain 12/14/2015  . Angina decubitus ([place]) 12/14/2015  . Adiposity 01/06/2013  . Arthritis of knee, degenerative 01/06/2013  . OSA (obstructive sleep apnea) 11/30/2011  . Cardiac arrest ([place]) 11/13/2011  . Diabetes mellitus type 2, uncontrolled ([place]) 11/13/2011   Past Medical History:  Diagnosis Date  . Arthritis    left knee,right hip  . Cardiac arrest ([place])   . Depression   . Diabetes  mellitus   . GERD (gastroesophageal reflux disease)   . Glaucoma   . H/O cardiac arrest 11/2011  . Heart murmur   . Hyperlipidemia   . Hypertension   . Myocardial infarction ([place]) 2013  . Sleep apnea    Bring machine,mask and tubing    Family History  Problem Relation Age of Onset  . Asthma Mother     Past Surgical History:  Procedure Laterality Date  . BACK SURGERY    . FOOT SURGERY    . HERNIA REPAIR    . KNEE SURGERY     left  . PARTIAL HYSTERECTOMY     Social History   Occupational History  . Not on file  Tobacco Use  . Smoking status: Never Smoker  . Smokeless tobacco: Never Used  Substance and Sexual Activity  . Alcohol use: No  . Drug use: No  . Sexual activity: No

## 2017-08-14 NOTE — Patient Outreach (Signed)
[place] [facility]) Care Management  [place] Social Work  08/14/2017  MATTINGLY FOUNTAINE 1956/01/08 [id number]  Encounter Medications:  Outpatient Encounter Medications as of 08/14/2017  Medication Sig  . acetaminophen (TYLENOL) 500 MG tablet Take 500 mg by mouth every 6 (six) hours as needed.  Marland Kitchen amLODipine (NORVASC) 5 MG tablet Take 5 mg by mouth daily.  Marland Kitchen aspirin 325 MG tablet Take 1 tablet (325 mg total) by mouth at bedtime.  . bimatoprost (LUMIGAN) 0.01 % SOLN Place 1 drop into both eyes at bedtime.  . cyclobenzaprine (FLEXERIL) 5 MG tablet Take 1 tablet (5 mg total) by mouth 3 (three) times daily as needed for muscle spasms.  . furosemide (LASIX) 20 MG tablet Take 1 tablet (20 mg total) by mouth daily.  Marland Kitchen gabapentin (NEURONTIN) 100 MG capsule Take 100 mg 3 (three) times daily by mouth.  Marland Kitchen HYDROcodone-acetaminophen (NORCO/VICODIN) 5-325 MG tablet Take 1 tablet every 6 (six) hours as needed by mouth for moderate pain or severe pain.  Marland Kitchen ibuprofen (ADVIL,MOTRIN) 400 MG tablet Take 1 tablet (400 mg total) by mouth every 8 (eight) hours as needed for moderate pain.  . Insulin Glargine (LANTUS SOLOSTAR) 100 UNIT/ML Solostar Pen Inject 12 Units at bedtime into the skin.  Marland Kitchen lisinopril (PRINIVIL,ZESTRIL) 20 MG tablet Take 20 mg by mouth daily before breakfast.   . magnesium hydroxide (MILK OF MAGNESIA) 400 MG/5ML suspension Take 15 mLs by mouth daily as needed for mild constipation.  . metoprolol succinate (TOPROL-XL) 100 MG 24 hr tablet Take 100 mg by mouth daily before breakfast. Take with or immediately following a meal.  . ONGLYZA 5 MG TABS tablet Take 5 mg by mouth daily.  Marland Kitchen oxybutynin (DITROPAN) 5 MG tablet Take 5 mg by mouth daily.  . pantoprazole (PROTONIX) 40 MG tablet Take 40 mg by mouth daily.  . potassium chloride (K-DUR) 10 MEQ tablet Take 10 mEq by mouth daily.  . pravastatin (PRAVACHOL) 40 MG tablet Take 40 mg by mouth at bedtime.   . traMADol (ULTRAM) 50 MG tablet Take  100 mg by mouth every 8 (eight) hours as needed for severe pain.   No facility-administered encounter medications on file as of 08/14/2017.     Functional Status:  In your present state of health, do you have any difficulty performing the following activities: 08/01/2017  Hearing? N  Vision? Y  Difficulty concentrating or making decisions? N  Walking or climbing stairs? Y  Dressing or bathing? Y  Doing errands, shopping? Y  Some recent data might be hidden    Fall/Depression Screening:  PHQ 2/9 Scores 08/01/2017  PHQ - 2 Score 4  PHQ- 9 Score 12    Assessment: CSW arrived at [facility] in order to complete visit. Patient was in her room in her wheelchair when CSW arrived. Patient reports having a very successful follow up appointment on 08/12/17. She shares that she scheduled another follow up appointment on 08/26/17 and wishes to wait until that appointment to schedule a discharge date from [place]. Patient's aide reports that she has made a lot of progress over the last week. Patient no longer has to wear her sling but cannot bear wear on right arm. Patient is now able to pick herself up (before two people had to assist with this) and go to the bathroom on her own. Patient is very satisfied that she has come such a long way over the past week. Patient shares that she is doing a lot better mentally as  well because she does not feel as overwhelmed. CSW provided emotional support during SNF visit that patient was receptive to. Patient continues to benefit from a strong family support who contact her multiple times throughout the day to check on her. Patient reports being able to have her first shower since being at SNF instead of doing a sponge bath and this was also beneficial for her. Patient reports being able to ambulate more during PT as well. Patient reports having one concern for her transition back home from SNF. Patient is interested in [employer] upon returning back home  from SNF. CSW will make referral to [employer] for a 30 day supply.   CSW met with SNF social worker. She reports that PT confirmed that patient is making a lot of progress. No discharge date has been set.   THN CM Care Plan Problem One     Most Recent Value  Care Plan Problem One  SNF admission and lack of support within the home to establish a safe and stable transition back home  Role Documenting the Problem One  [place] for Problem One  Active  [facility] Long Term Goal   Patient will have a safe and stable discharge back home from SNF within 90 days  [place] Term Goal Start Date  08/01/17  Interventions for Problem One Long Term Goal  Patient went to follow up apointment and arm sling is now off. However, patient is going to wait until after her next follow up appointment on 08/26/17 before deciding on a discharge date. CSW discussed this with SNF social worker today as well.  THN CM Short Term Goal #1   Patient will gain aides through [place] [facility] for 30 days to assist with transition back home from SNF  [facility] CM Short Term Goal #1 Start Date  08/01/17  Interventions for Short Term Goal #1  Goal will be pushed back until expected discharge date.     Plan: CSW will continue to follow patient while at SNF and await for SNF discharge in order to make referral to [facility] and [employer] paid for through [place].  Eula Fried, BSW, MSW, [email or web address] Phone: [phone] Fax: [phone]

## 2017-08-16 ENCOUNTER — Encounter: Payer: Self-pay | Admitting: Adult Health

## 2017-08-16 ENCOUNTER — Non-Acute Institutional Stay (SKILLED_NURSING_FACILITY): Payer: Medicare Other | Admitting: Adult Health

## 2017-08-16 DIAGNOSIS — E785 Hyperlipidemia, unspecified: Secondary | ICD-10-CM | POA: Diagnosis not present

## 2017-08-16 DIAGNOSIS — E1165 Type 2 diabetes mellitus with hyperglycemia: Secondary | ICD-10-CM | POA: Diagnosis not present

## 2017-08-16 DIAGNOSIS — E876 Hypokalemia: Secondary | ICD-10-CM | POA: Diagnosis not present

## 2017-08-16 DIAGNOSIS — S42214D Unspecified nondisplaced fracture of surgical neck of right humerus, subsequent encounter for fracture with routine healing: Secondary | ICD-10-CM

## 2017-08-16 DIAGNOSIS — R350 Frequency of micturition: Secondary | ICD-10-CM

## 2017-08-16 DIAGNOSIS — G629 Polyneuropathy, unspecified: Secondary | ICD-10-CM

## 2017-08-16 DIAGNOSIS — I5022 Chronic systolic (congestive) heart failure: Secondary | ICD-10-CM

## 2017-08-16 DIAGNOSIS — I10 Essential (primary) hypertension: Secondary | ICD-10-CM

## 2017-08-16 NOTE — Progress Notes (Signed)
DATE:  08/16/2017   MRN:  [id number]  BIRTHDAY: 1956-05-18  Facility:  Nursing Home Location:  [facility] Room Number: 213-A  LEVEL OF CARE:  SNF [phone])  Contact Information    Name Relation Home Work [place] Brother [phone]  [phone]   Harris,Carol Sister [phone]  [phone]       Code Status History    Date Active Date Inactive Code Status Order ID Comments User Context   07/12/2017 20:28 07/15/2017 19:44 Full Code [id number]  Merton Border, MD Inpatient       Chief Complaint  Patient presents with  . Medical Management of Chronic Issues    Routine [facility] visit    HISTORY OF PRESENT ILLNESS:  This is a 62-YO female seen for a routine visit.  She is a short-term rehabilitation resident of [facility].  She has a PMH of CAD with ischemic cardiomyopathy, type 2 DM, sleep apnea, dyslipidemia, and HTN. She was seen in her room today. Her Lantus was recently increased from 12 units to 18 units Q HS. Noted CBGs are still elevated - 332, 358, 273, 306, 282. Noted that she no longer wears her right arm sling.   PAST MEDICAL HISTORY:  Past Medical History:  Diagnosis Date  . Arthritis    left knee,right hip  . Cardiac arrest ([place])   . Depression   . Diabetes mellitus   . GERD (gastroesophageal reflux disease)   . Glaucoma   . H/O cardiac arrest 11/2011  . Heart murmur   . Hyperlipidemia   . Hypertension   . Myocardial infarction ([place]) 2013  . Sleep apnea    Bring machine,mask and tubing     CURRENT MEDICATIONS: Reviewed    Medication List        Accurate as of 08/16/17 10:01 AM. Always use your most recent med list.          acetaminophen 500 MG tablet Commonly known as:  TYLENOL   amLODipine 5 MG tablet Commonly known as:  NORVASC   aspirin 325 MG tablet Take 1 tablet (325 mg total) by mouth at bedtime.   betamethasone dipropionate 0.05 % cream Commonly known as:   DIPROLENE   cyclobenzaprine 5 MG tablet Commonly known as:  FLEXERIL Take 1 tablet (5 mg total) by mouth 3 (three) times daily as needed for muscle spasms.   furosemide 20 MG tablet Commonly known as:  LASIX Take 1 tablet (20 mg total) by mouth daily.   gabapentin 100 MG capsule Commonly known as:  NEURONTIN   HYDROcodone-acetaminophen 5-325 MG tablet Commonly known as:  NORCO/VICODIN Take 1 tablet every 6 (six) hours as needed by mouth for moderate pain or severe pain.   ibuprofen 400 MG tablet Commonly known as:  ADVIL,MOTRIN Take 1 tablet (400 mg total) by mouth every 8 (eight) hours as needed for moderate pain.   LANTUS SOLOSTAR 100 UNIT/ML Solostar Pen Generic drug:  Insulin Glargine   lisinopril 20 MG tablet Commonly known as:  PRINIVIL,ZESTRIL   LUMIGAN 0.01 % Soln Generic drug:  bimatoprost   magnesium hydroxide 400 MG/5ML suspension Commonly known as:  MILK OF MAGNESIA   metoprolol succinate 100 MG 24 hr tablet Commonly known as:  TOPROL-XL   NOVOLOG 100 UNIT/ML injection Generic drug:  insulin aspart   ONGLYZA 5 MG Tabs tablet Generic drug:  saxagliptin HCl   oxybutynin 5 MG tablet Commonly known as:  DITROPAN   pantoprazole  40 MG tablet Commonly known as:  PROTONIX   potassium chloride 10 MEQ tablet Commonly known as:  K-DUR   pravastatin 40 MG tablet Commonly known as:  PRAVACHOL   traMADol 50 MG tablet Commonly known as:  ULTRAM        Allergies  Allergen Reactions  . Contrast Media [Iodinated Diagnostic Agents] Anaphylaxis     REVIEW OF SYSTEMS:  GENERAL: no change in appetite, no fatigue, no weight changes, no fever, chills or weakness MOUTH and THROAT: Denies oral discomfort RESPIRATORY: no cough, SOB, DOE, wheezing, hemoptysis CARDIAC: no chest pain, edema or palpitations GI: no abdominal pain, diarrhea, constipation, heart burn, nausea or vomiting GU: Denies dysuria, frequency, hematuria, incontinence, or  discharge PSYCHIATRIC: Denies feeling of depression or anxiety. No report of hallucinations, insomnia, paranoia, or agitation    PHYSICAL EXAMINATION  GENERAL APPEARANCE: Well nourished. In no acute distress. Morbidly obese SKIN:  Skin is warm and dry.  MOUTH and THROAT: Lips are without lesions. Oral mucosa is moist and without lesions. RESPIRATORY: breathing is even & unlabored, BS CTAB CARDIAC: RRR, no murmur,no extra heart sounds, BLE 1+edema GI: abdomen soft, normal BS, no masses, no tenderness, no hepatomegaly, no splenomegaly EXTREMITIES:  Able to move X 4 extremities PSYCHIATRIC: Alert and oriented X 3. Affect and behavior are appropriate   LABS/RADIOLOGY: Labs reviewed: Basic Metabolic Panel: Recent Labs    07/13/17 0302 07/14/17 0535 07/15/17 0335 07/28/17 0626  NA 139 138 139 138  K 3.2* 3.6 3.6 4.2  CL 106 104 104 102  CO2 23 27 28   --   GLUCOSE 188* 237* 212* 271*  BUN 5* 8 8 18   CREATININE 0.50 0.52 0.50 0.60  CALCIUM 8.8* 8.6* 8.5*  --    CBC: Recent Labs    07/12/17 1525 07/28/17 0612 07/28/17 0626  WBC 15.9* 10.0  --   NEUTROABS  --  6.6  --   HGB 11.8* 11.7* 12.6  HCT 37.2 37.2 37.0  MCV 82.7 83.2  --   PLT 236 329  --    Cardiac Enzymes: Recent Labs    07/12/17 1525 07/13/17 0302  CKTOTAL 1,452* 677*   CBG: Recent Labs    07/15/17 0624 07/15/17 1156 07/15/17 1604  GLUCAP 180* 305* 281*      Dg Chest 2 View  Result Date: 07/28/2017 CLINICAL DATA:  Status post fall 2 weeks ago, with concern for chest injury. Initial encounter. EXAM: CHEST  2 VIEW COMPARISON:  Chest radiograph performed 07/12/2017 FINDINGS: The lungs are well-aerated and clear. There is no evidence of focal opacification, pleural effusion or pneumothorax. The heart is mildly enlarged. There is a comminuted and mildly displaced fracture of the right humeral head and neck. IMPRESSION: 1. Comminuted and mildly displaced fracture of the right humeral head and neck, as  previously noted. 2. Mild cardiomegaly.  Lungs remain grossly clear. Electronically Signed   By: Garald Balding M.D.   On: 07/28/2017 06:55   Ct Hip Right Wo Contrast  Result Date: 07/28/2017 CLINICAL DATA:  Status post fall several weeks ago, with persistent sharp right hip pain. Initial encounter. EXAM: CT OF THE RIGHT HIP WITHOUT CONTRAST TECHNIQUE: Multidetector CT imaging of the right hip was performed according to the standard protocol. Multiplanar CT image reconstructions were also generated. COMPARISON:  Right hip radiographs performed earlier today at 2:01 a.m., and CT of the abdomen and pelvis performed 01/27/2013 FINDINGS: Bones/Joint/Cartilage A minimally displaced subcapital fracture through the right femoral neck is suspected. This  could be better assessed on MRI, if deemed clinically necessary. The right femoral head remains seated at the acetabulum. No significant hip joint effusion is seen. The cartilage is not well assessed on CT. Vacuum phenomenon is noted at the right sacroiliac joint. Ligaments Suboptimally assessed by CT. Muscles and Tendons Mild soft tissue injury is suggested about the right gluteus musculature. Visualized tendon structures are grossly unremarkable. Soft tissues The visualized small and large bowel loops are grossly unremarkable. The bladder is mildly distended and unremarkable in appearance, though incompletely characterized. A mildly prominent right inguinal node is seen, measuring 1.2 cm in short axis. IMPRESSION: 1. Suspect minimally displaced subcapital fracture through the right femoral neck. This could be better assessed on MRI, if deemed clinically necessary. 2. Mild soft tissue injury suggested about the right gluteus musculature. Electronically Signed   By: Garald Balding M.D.   On: 07/28/2017 05:17   Mr Hip Right Wo Contrast  Result Date: 07/28/2017 CLINICAL DATA:  Chronic right hip pain.  Patient fell 2 weeks ago. EXAM: MR OF THE RIGHT HIP WITHOUT  CONTRAST TECHNIQUE: Multiplanar, multisequence MR imaging was performed. No intravenous contrast was administered. COMPARISON:  Radiographs and CT same date. FINDINGS: Bones: There is no evidence of acute fracture, dislocation or avascular necrosis. The visualized bony pelvis appears normal. The visualized sacroiliac joints and symphysis pubis appear normal. Coronal images demonstrate inferior endplate edema in the L2 vertebral body, probably degenerative. Articular cartilage and labrum Articular cartilage: No focal chondral defect or subchondral signal abnormality identified. Labrum: There is no gross labral tear or paralabral abnormality. Joint or bursal effusion Joint effusion: No significant hip joint effusion. Bursae: No focal periarticular fluid collection. Muscles and tendons Muscles and tendons: The visualized gluteus, hamstring and iliopsoas tendons appear normal. The piriformis muscles appear symmetric. Other findings Miscellaneous: The visualized internal pelvic contents appear unremarkable. IMPRESSION: 1. No evidence of acute hip fracture or dislocation. 2. Inferior endplate edema at L2, only imaged partially in the coronal plane but likely degenerative. Electronically Signed   By: Richardean Sale M.D.   On: 07/28/2017 09:06   Dg Hip Unilat With Pelvis 2-3 Views Right  Result Date: 07/28/2017 CLINICAL DATA:  [place] 2 weeks ago.  RIGHT hip pain. EXAM: DG HIP (WITH OR WITHOUT PELVIS) 2-3V RIGHT COMPARISON:  None. FINDINGS: Habitus limited examination. Faint linear lucency medial RIGHT femoral neck seen on AP view. No dislocation. No destructive bony lesions. There is no evidence of arthropathy or other focal bone abnormality. IMPRESSION: Habitus limited examination. Linear lucency favoring vascular channel or artifact RIGHT femoral neck, less likely incomplete fracture. Electronically Signed   By: Elon Alas M.D.   On: 07/28/2017 02:33   Xr Shoulder Right  Result Date: 08/14/2017 AP outlet  right shoulder reviewed.  Again noted is minimally displaced proximal humerus fracture.  Some evidence of calcification noted.  There is no dislocation.  The visualized lung fields clear  Xr Shoulder Right  Result Date: 07/30/2017 2 views right proximal humerus demonstrates proximal humerus fracture unchanged in alignment from previous radial grafts.  The head is located.  It is about 30% medial shift of the shaft in relation to the head.   ASSESSMENT/PLAN:  1. Uncontrolled type 2 diabetes mellitus with hyperglycemia (HCC) - will increase Lantus 100 units/ml  From 18 units to 24 units SQ Q HS, NovoLog 100 units/mL inject 3 units subcutaneous before lunch and dinner and Onglyza 5 mg 1 tab daily   2. Closed nondisplaced fracture of surgical  neck of right humerus with routine healing, unspecified fracture morphology, subsequent encounter - sling has been discontinued, will follow-up with Dr. Marlou Sa on 11/26 accdg to patient, continue cyclobenzaprine 5 mg 1 tab 3 times daily as needed for muscle spasm, tramadol 50 mg daily 2 tabs = 100 mg every 8 hours when necessary and ibuprofen 400 mg 1 tab every 8 hours when necessary for pain   3. Hypokalemia - continue KCl ER 10 meq daily Lab Results  Component Value Date   K 4.2 07/28/2017     4. Hyperlipidemia, unspecified hyperlipidemia type - continue pravastatin 40 mg 1 tab daily at bedtime   5. Essential hypertension - well-controlled, continue amlodipine 5 mg 1 tab daily,  Lisinopril 20 mg 1 tab daily and metoprolol succinate ER 100 mg 1 tab daily   6. Urinary frequency -  continue oxybutynin 5 mg 1 tab daily   7. Neuropathy - stable, continue gabapentin 100 mg 1 capsule every 8 hours  8. Chronic systolic heart failure- no SOB, continue Lasix 20 mg 1 tab daily and metoprolol succinate ER 100 mg 1 tab daily      Goals of care:  Long-term care     Monina C. Riverside - NP    [employer] [phone]

## 2017-08-21 ENCOUNTER — Other Ambulatory Visit: Payer: Self-pay | Admitting: Licensed Clinical Social Worker

## 2017-08-21 NOTE — Patient Outreach (Signed)
  [facility]) Care Management  08/21/2017  JODEL MAYHALL [birth year] [id number]  Assessment- CSW completed outreach call to Cameron Ali, SNF social worker at [place] facility. CSW was unable to reach her successfully but left a voice message requesting a return call with any discharge updates.  Plan-CSW will await for return call or complete additional outreach within two weeks.  Eula Fried, BSW, MSW, [email or web address] Phone: [phone] Fax: [phone]

## 2017-08-26 ENCOUNTER — Ambulatory Visit (INDEPENDENT_AMBULATORY_CARE_PROVIDER_SITE_OTHER): Payer: PRIVATE HEALTH INSURANCE

## 2017-08-26 ENCOUNTER — Other Ambulatory Visit: Payer: Self-pay | Admitting: Licensed Clinical Social Worker

## 2017-08-26 ENCOUNTER — Encounter (INDEPENDENT_AMBULATORY_CARE_PROVIDER_SITE_OTHER): Payer: Self-pay | Admitting: Orthopedic Surgery

## 2017-08-26 ENCOUNTER — Ambulatory Visit (INDEPENDENT_AMBULATORY_CARE_PROVIDER_SITE_OTHER): Payer: PRIVATE HEALTH INSURANCE | Admitting: Orthopedic Surgery

## 2017-08-26 DIAGNOSIS — S42214D Unspecified nondisplaced fracture of surgical neck of right humerus, subsequent encounter for fracture with routine healing: Secondary | ICD-10-CM

## 2017-08-26 NOTE — Patient Outreach (Signed)
[facility]) Care Management  08/26/2017  Whitney [name] [birth year] [id number]  Assessment- CSW received incoming call from patient. She reports that she just completed PT and that they are very pleased with her progress. She reports that she has an appointment today at 1 pm and is thinking she will either discharged back home on 08/27/17 or 08/28/17. Patient is in need of Mobile Meals and Home Care Provider services that CSW will set up once a definite d/c date has been set. Patient is agreeable to contact CSW back with d/c date confirmation.   CSW contacted [facility] and provided update.  Plan-CSW will await for return call from patient.  Eula Fried, BSW, MSW, [email or web address] Phone: [phone] Fax: [phone]

## 2017-08-26 NOTE — Progress Notes (Signed)
Post-Op Visit Note   Patient: Whitney [name]           Date of Birth: May 26, 1956           MRN: [id number] Visit Date: 08/26/2017 PCP: Darcus Austin, MD   Assessment & Plan:  Chief Complaint:  Chief Complaint  Patient presents with  . Right Shoulder - Follow-up, Fracture   Visit Diagnoses:  1. Closed nondisplaced fracture of surgical neck of right humerus with routine healing, unspecified fracture morphology, subsequent encounter     Plan: Monetta is a patient who is now 6 weeks out right proximal humerus fracture.  She is doing well.  She is out of her sling and she's been weightbearing through the shoulder on the walker.  On exam she has good range of motion below shoulder level.  Rotator cuff strength predictably week.  All in all though the patient is improved with her shoulder range of motion and has mildly improving strength.  Plan is to continue home health PT.  I think that she is fine from an orthopedic standpoint to return back to her home with a walker.  I'll see her back as needed.  She will need occupational therapy for at least 4 weeks  Follow-Up Instructions: Return if symptoms worsen or fail to improve.   Orders:  Orders Placed This Encounter  Procedures  . XR Shoulder Right   No orders of the defined types were placed in this encounter.   Imaging: Xr Shoulder Right  Result Date: 08/26/2017 AP outlet right shoulder reviewed.  Axillary lateral review.  Continue fracture consolidation is noted of proximal humerus fracture no dislocation or complicating features.  Soft tissue envelope is large and inhibits examination of  bony detail   PMFS History: Patient Active Problem List   Diagnosis Date Noted  . Acute right lumbar radiculopathy 07/25/2017  . At risk for adverse drug event 07/16/2017  . Humerus fracture 07/12/2017  . Morbid obesity due to excess calories ([place]) 12/14/2015  . Chronic systolic heart failure ([place]) 12/14/2015  . Chest pain 12/14/2015    . Angina decubitus ([place]) 12/14/2015  . Adiposity 01/06/2013  . Arthritis of knee, degenerative 01/06/2013  . OSA (obstructive sleep apnea) 11/30/2011  . Cardiac arrest ([place]) 11/13/2011  . Diabetes mellitus type 2, uncontrolled ([place]) 11/13/2011   Past Medical History:  Diagnosis Date  . Arthritis    left knee,right hip  . Cardiac arrest ([place])   . Depression   . Diabetes mellitus   . GERD (gastroesophageal reflux disease)   . Glaucoma   . H/O cardiac arrest 11/2011  . Heart murmur   . Hyperlipidemia   . Hypertension   . Myocardial infarction ([place]) 2013  . Sleep apnea    Bring machine,mask and tubing    Family History  Problem Relation Age of Onset  . Asthma Mother     Past Surgical History:  Procedure Laterality Date  . BACK SURGERY    . COLONOSCOPY N/A 09/12/2016   Procedure: COLONOSCOPY;  Surgeon: Clarene Essex, MD;  Location: [facility];  Service: Endoscopy;  Laterality: N/A;  . CYSTOSCOPY W/ URETERAL STENT PLACEMENT Left 01/04/2013   Procedure: CYSTOSCOPY WITH RETROGRADE PYELOGRAM/URETERAL STENT PLACEMENT;  Surgeon: Bernestine Amass, MD;  Location: [facility] ORS;  Service: Urology;  Laterality: Left;  . CYSTOSCOPY WITH RETROGRADE PYELOGRAM, URETEROSCOPY AND STENT PLACEMENT Left 01/26/2013   Procedure: CYSTOSCOPY, JJ STENT REMOVAL, LEFT URETEROSCOPY, ;  Surgeon: Bernestine Amass, MD;  Location: [facility] ORS;  Service:  Urology;  Laterality: Left;  CYSTO, JJ STENT REMOVAL, LEFT URETEROSCOPY   . FOOT SURGERY    . HERNIA REPAIR    . HOLMIUM LASER APPLICATION Left 8/82/8003   Procedure: HOLMIUM LASER LITHOTRIPSY ;  Surgeon: Bernestine Amass, MD;  Location: [facility] ORS;  Service: Urology;  Laterality: Left;  . KNEE SURGERY     left  . PARTIAL HYSTERECTOMY     Social History   Occupational History  . Not on file  Tobacco Use  . Smoking status: Never Smoker  . Smokeless tobacco: Never Used  Substance and Sexual Activity  . Alcohol use: No  . Drug use: No  . Sexual activity: No

## 2017-08-26 NOTE — Patient Outreach (Signed)
[facility]) Care Management  08/26/2017  Whitney [name] 03-28-1956 [id number]  Assessment- CSW received incoming call from patient on 08/26/17. Patient reports that she will be discharging back home on 08/27/17. CSW informed her that she will update Mobile Meals and Home Care Providers at this time.  CSW sent request to [place] Management Assistant to arrange for a 30 day supply of Mobile Meals. Secure email was sent out to [employer] supervisor with request.  CSW sent secure email to [name] with [facility] with updated demographics sheet with medications list. CSW completed call to [facility] but was unable to reach anyone. CSW left a message informing staff that a confirmed discharge date was set for 08/27/17 and patient is hopeful to have [place] Provider services to start on 08/28/17 if at all possible.  Plan-CSW will continue to assist with ensuring a safe and stable discharge back home from SNF.  Eula Fried, BSW, MSW, [email or web address] Phone: [phone] Fax: [phone]

## 2017-08-26 NOTE — Patient Outreach (Signed)
Request received from Eula Fried, LCSW to refer patient to [employer] [facility].  Referral made on 08/26/17.  Meals will begin as soon as possible but goal is by Wednesday.  Programmer, multimedia will notify patient.

## 2017-08-27 ENCOUNTER — Other Ambulatory Visit: Payer: Self-pay | Admitting: *Deleted

## 2017-08-27 NOTE — Patient Outreach (Signed)
[facility]) Care Management  08/27/2017  Whitney [name] 1956/03/31 [id number]   Met with patient, she is ready to go home at the end of the week.  PT is working on transfers in/out of chairs and patient has full weight bearing.   Patient reports she is on new insulin and 2 new oral medications and will need diabetic supplies.  She has home care aide set up for 30 days and mobile meals.   Plan: Will let [facility] care team know of planned discharge.  Royetta Crochet. Laymond Purser, RN, BSN, [place] [phone]) Business Cell  [phone]) Toll Free Office

## 2017-08-28 ENCOUNTER — Other Ambulatory Visit: Payer: Self-pay | Admitting: Licensed Clinical Social Worker

## 2017-08-28 NOTE — Patient Outreach (Signed)
[facility]) Care Management  [facility] Social Work  08/28/2017  LARK LANGENFELD 05/21/1956 [id number]  Encounter Medications:  Outpatient Encounter Medications as of 08/28/2017  Medication Sig  . acetaminophen (TYLENOL) 500 MG tablet Take 500 mg by mouth every 6 (six) hours as needed.  Marland Kitchen amLODipine (NORVASC) 5 MG tablet Take 5 mg by mouth daily.  Marland Kitchen aspirin 325 MG tablet Take 1 tablet (325 mg total) by mouth at bedtime.  . betamethasone dipropionate (DIPROLENE) 0.05 % cream Apply 1 application 2 (two) times daily topically.  . bimatoprost (LUMIGAN) 0.01 % SOLN Place 1 drop into both eyes at bedtime.  . cyclobenzaprine (FLEXERIL) 5 MG tablet Take 1 tablet (5 mg total) by mouth 3 (three) times daily as needed for muscle spasms.  . furosemide (LASIX) 20 MG tablet Take 1 tablet (20 mg total) by mouth daily.  Marland Kitchen gabapentin (NEURONTIN) 100 MG capsule Take 100 mg 3 (three) times daily by mouth.  Marland Kitchen HYDROcodone-acetaminophen (NORCO/VICODIN) 5-325 MG tablet Take 1 tablet every 6 (six) hours as needed by mouth for moderate pain or severe pain.  Marland Kitchen ibuprofen (ADVIL,MOTRIN) 400 MG tablet Take 1 tablet (400 mg total) by mouth every 8 (eight) hours as needed for moderate pain.  Marland Kitchen insulin aspart (NOVOLOG) 100 UNIT/ML injection Inject 3 Units See admin instructions into the skin. Take before lunch and dinner  . Insulin Glargine (LANTUS SOLOSTAR) 100 UNIT/ML Solostar Pen Inject 18 Units at bedtime into the skin.   Marland Kitchen lisinopril (PRINIVIL,ZESTRIL) 20 MG tablet Take 20 mg by mouth daily before breakfast.   . magnesium hydroxide (MILK OF MAGNESIA) 400 MG/5ML suspension Take 15 mLs by mouth daily as needed for mild constipation.  . metoprolol succinate (TOPROL-XL) 100 MG 24 hr tablet Take 100 mg by mouth daily before breakfast. Take with or immediately following a meal.  . ONGLYZA 5 MG TABS tablet Take 5 mg by mouth daily.  Marland Kitchen oxybutynin (DITROPAN) 5 MG tablet Take 5 mg by mouth daily.  . pantoprazole  (PROTONIX) 40 MG tablet Take 40 mg by mouth daily.  . potassium chloride (K-DUR) 10 MEQ tablet Take 10 mEq by mouth daily.  . pravastatin (PRAVACHOL) 40 MG tablet Take 40 mg by mouth at bedtime.   . traMADol (ULTRAM) 50 MG tablet Take 100 mg by mouth every 8 (eight) hours as needed for severe pain.   No facility-administered encounter medications on file as of 08/28/2017.     Functional Status:  In your present state of health, do you have any difficulty performing the following activities: 08/01/2017  Hearing? N  Vision? Y  Difficulty concentrating or making decisions? N  Walking or climbing stairs? Y  Dressing or bathing? Y  Doing errands, shopping? Y  Some recent data might be hidden    Fall/Depression Screening:  PHQ 2/9 Scores 08/01/2017  PHQ - 2 Score 4  PHQ- 9 Score 12    Assessment: CSW arrived at [facility] and went to patient's room to complete visit. Patient was eating lunch when CSW arrived. Patient reports that she was tearful yesterday because she was ready to return back home from SNF but did not want to leave AMA. However, patient is happy with her decision now and feels that this will give her more time to get prepared for her return back home. Patient was informed to keep discharge paperwork so that she can provide to nurse from [facility]. Patient agreeable to do this as she has had some medication changes  and will be on insulin now. CSW went to SNF social worker's office but she was not available to meet. CSW completed a call to SNF social worker and left a message requesting a return call with discharge updates once set (which [facility] agency was chosen, what medical equipment was ordered and what services were ordered?) CSW also left on voice message that [facility] were wanting discharge paperwork and an updated medication list and patient is willing to provide that information. Patient understands that both Mobile Meals and [facility] may not  start services until 09/02/17. Patient is agreeable to [facility] referral post discharge.   Plan: CSW will make referral for [facility] RNCM post discharge. CSW will await for return call from SNF social worker. CSW will follow up within one week.  Eula Fried, BSW, MSW, [email or web address] Phone: [phone] Fax: [phone]

## 2017-08-28 NOTE — Patient Outreach (Signed)
[place] [facility]) Care Management  08/28/2017  Whitney [name] 1955-10-13 [id number]  Assessment- CSW received incoming call from patient on 08/27/17. CSW was informed that patient's discharge date has changed to 08/30/17. CSW completed call to Whitney [name] with [employer] to provide update but was unable to reach him. HIPPA compliant voice message was left. CSW also sent an email with this update. CSW sent SECURE email to Whitney [name] providing discharge date change update.  Plan-CSW will continue to follow case and will complete referral to [place] once patient has discharged home.  Eula Fried, BSW, MSW, [email or web address] Phone: [phone] Fax: [phone]

## 2017-08-29 ENCOUNTER — Encounter: Payer: Self-pay | Admitting: Adult Health

## 2017-08-29 ENCOUNTER — Non-Acute Institutional Stay (SKILLED_NURSING_FACILITY): Payer: Medicare Other | Admitting: Adult Health

## 2017-08-29 DIAGNOSIS — Z8674 Personal history of sudden cardiac arrest: Secondary | ICD-10-CM

## 2017-08-29 DIAGNOSIS — S42214D Unspecified nondisplaced fracture of surgical neck of right humerus, subsequent encounter for fracture with routine healing: Secondary | ICD-10-CM | POA: Diagnosis not present

## 2017-08-29 DIAGNOSIS — I5022 Chronic systolic (congestive) heart failure: Secondary | ICD-10-CM

## 2017-08-29 DIAGNOSIS — E876 Hypokalemia: Secondary | ICD-10-CM | POA: Diagnosis not present

## 2017-08-29 DIAGNOSIS — R350 Frequency of micturition: Secondary | ICD-10-CM

## 2017-08-29 DIAGNOSIS — I10 Essential (primary) hypertension: Secondary | ICD-10-CM | POA: Diagnosis not present

## 2017-08-29 DIAGNOSIS — E785 Hyperlipidemia, unspecified: Secondary | ICD-10-CM | POA: Diagnosis not present

## 2017-08-29 DIAGNOSIS — I469 Cardiac arrest, cause unspecified: Secondary | ICD-10-CM | POA: Diagnosis not present

## 2017-08-29 DIAGNOSIS — G629 Polyneuropathy, unspecified: Secondary | ICD-10-CM | POA: Diagnosis not present

## 2017-08-29 DIAGNOSIS — E1165 Type 2 diabetes mellitus with hyperglycemia: Secondary | ICD-10-CM

## 2017-08-29 MED ORDER — POTASSIUM CHLORIDE ER 10 MEQ PO TBCR: 10.0000 meq | EXTENDED_RELEASE_TABLET | Freq: Every day | ORAL | 0 refills | Status: DC

## 2017-08-29 MED ORDER — LISINOPRIL 20 MG PO TABS: 20.0000 mg | ORAL_TABLET | Freq: Every day | ORAL | 0 refills | Status: DC

## 2017-08-29 MED ORDER — ONGLYZA 5 MG PO TABS: 5.0000 mg | ORAL_TABLET | Freq: Every day | ORAL | 0 refills | Status: DC

## 2017-08-29 MED ORDER — BETAMETHASONE DIPROPIONATE 0.05 % EX CREA: 1.0000 "application " | TOPICAL_CREAM | Freq: Two times a day (BID) | CUTANEOUS | 0 refills | Status: DC

## 2017-08-29 MED ORDER — PRAVASTATIN SODIUM 40 MG PO TABS: 40.0000 mg | ORAL_TABLET | Freq: Every day | ORAL | 0 refills | Status: DC

## 2017-08-29 MED ORDER — TRAMADOL HCL 50 MG PO TABS: 100.0000 mg | ORAL_TABLET | Freq: Three times a day (TID) | ORAL | 0 refills | Status: AC | PRN

## 2017-08-29 MED ORDER — BLOOD GLUCOSE MONITOR KIT: PACK | 0 refills | Status: DC

## 2017-08-29 MED ORDER — PANTOPRAZOLE SODIUM 40 MG PO TBEC: 40.0000 mg | DELAYED_RELEASE_TABLET | Freq: Every day | ORAL | 0 refills | Status: DC

## 2017-08-29 MED ORDER — BIMATOPROST 0.01 % OP SOLN: 1.0000 [drp] | Freq: Every day | OPHTHALMIC | 0 refills | Status: DC

## 2017-08-29 MED ORDER — FUROSEMIDE 20 MG PO TABS: 20.0000 mg | ORAL_TABLET | Freq: Every day | ORAL | 0 refills | Status: DC

## 2017-08-29 MED ORDER — GABAPENTIN 100 MG PO CAPS: 100.0000 mg | ORAL_CAPSULE | Freq: Three times a day (TID) | ORAL | 0 refills | Status: DC

## 2017-08-29 MED ORDER — CYCLOBENZAPRINE HCL 5 MG PO TABS: 5.0000 mg | ORAL_TABLET | Freq: Three times a day (TID) | ORAL | 0 refills | Status: DC | PRN

## 2017-08-29 MED ORDER — INSULIN ASPART 100 UNIT/ML FLEXPEN: 3.0000 [IU] | PEN_INJECTOR | SUBCUTANEOUS | 0 refills | Status: DC

## 2017-08-29 MED ORDER — HYDROCODONE-ACETAMINOPHEN 5-325 MG PO TABS: 1.0000 | ORAL_TABLET | Freq: Four times a day (QID) | ORAL | 0 refills | Status: DC | PRN

## 2017-08-29 MED ORDER — AMLODIPINE BESYLATE 5 MG PO TABS: 5.0000 mg | ORAL_TABLET | Freq: Every day | ORAL | 0 refills | Status: DC

## 2017-08-29 MED ORDER — OXYBUTYNIN CHLORIDE 5 MG PO TABS: 5.0000 mg | ORAL_TABLET | Freq: Every day | ORAL | 0 refills | Status: AC

## 2017-08-29 MED ORDER — INSULIN GLARGINE 100 UNIT/ML SOLOSTAR PEN: 24.0000 [IU] | PEN_INJECTOR | Freq: Every day | SUBCUTANEOUS | 0 refills | Status: DC

## 2017-08-29 MED ORDER — METOPROLOL SUCCINATE ER 100 MG PO TB24: 100.0000 mg | ORAL_TABLET | Freq: Every day | ORAL | 0 refills | Status: AC

## 2017-08-29 NOTE — Progress Notes (Signed)
Location:  [facility] Room Number: 213-A Place of Service:  SNF (31) Provider:  Medina-Vargas, , NP  Patient Care Team: Gates, Donna, MD as PCP - General (Family Medicine) Joyce, Brooke L, LCSW as [facility] Care Management (Licensed Clinical Social Worker)  Extended Emergency Contact Information Primary Emergency Contact: Hayes,Roy          [place], [place] Home Phone: [phone] Mobile Phone: [phone] Relation: Brother Secondary Emergency Contact: Harris,Carol  [place] Home Phone: [phone] Mobile Phone: [phone] Relation: Sister  Code Status:  Full Code Goals of care: Advanced Directive information Advanced Directives 08/01/2017  Does Patient Have a Medical Advance Directive? No  Would patient like information on creating a medical advance directive? No - Patient declined  Pre-existing out of facility DNR order (yellow form or pink MOST form) -     Chief Complaint  Patient presents with  . Discharge Note    Patient is discharging to home on 08/30/17    HPI:  Pt is a 61 y.o. female seen today for discharge.  She is discharging to home 08/30/2017 with home health OT, PT, and Nursing for diabetes administration teaching.    She has been admitted to [facility] on 07/15/17 from [facility] admission dates 07/12/17 to 07/15/17.  She had a mechanical fall in the kitchen. Shoulder x-ray showed comminuted fracture of the proximal humeral neck. CT with mild GE impacted and medially displaced surgical neck fracture of the right humerus extends into the greater tuberosity which is mildly comminuted and displaced. Conservative management was recommended. She is no longer wearing the sling. She was seen today in her room after taking a bath. She has a PMH of CAD with ischemic cardiomyopathy, DM2, sleep apnea, dyslipidemia, and hypertension.  Patient was admitted to this facility  for short-term rehabilitation after the patient's recent hospitalization.  Patient has completed SNF rehabilitation and therapy has cleared the patient for discharge.    Past Medical History:  Diagnosis Date  . Arthritis    left knee,right hip  . Cardiac arrest (HCC)   . Depression   . Diabetes mellitus   . GERD (gastroesophageal reflux disease)   . Glaucoma   . H/O cardiac arrest 11/2011  . Heart murmur   . Hyperlipidemia   . Hypertension   . Myocardial infarction (HCC) 2013  . Sleep apnea    Bring machine,mask and tubing   Past Surgical History:  Procedure Laterality Date  . BACK SURGERY    . COLONOSCOPY N/A 09/12/2016   Procedure: COLONOSCOPY;  Surgeon: Marc Magod, MD;  Location: [facility];  Service: Endoscopy;  Laterality: N/A;  . CYSTOSCOPY W/ URETERAL STENT PLACEMENT Left 01/04/2013   Procedure: CYSTOSCOPY WITH RETROGRADE PYELOGRAM/URETERAL STENT PLACEMENT;  Surgeon: David S Grapey, MD;  Location: [facility];  Service: Urology;  Laterality: Left;  . CYSTOSCOPY WITH RETROGRADE PYELOGRAM, URETEROSCOPY AND STENT PLACEMENT Left 01/26/2013   Procedure: CYSTOSCOPY, JJ STENT REMOVAL, LEFT URETEROSCOPY, ;  Surgeon: David S Grapey, MD;  Location: [facility];  Service: Urology;  Laterality: Left;  CYSTO, JJ STENT REMOVAL, LEFT URETEROSCOPY   . FOOT SURGERY    . HERNIA REPAIR    . HOLMIUM LASER APPLICATION Left 01/26/2013   Procedure: HOLMIUM LASER LITHOTRIPSY ;  Surgeon: David S Grapey, MD;  Location: [facility];  Service: Urology;  Laterality: Left;  . KNEE SURGERY     left  . PARTIAL HYSTERECTOMY      Allergies    Allergen Reactions  . Contrast Media [Iodinated Diagnostic Agents] Anaphylaxis    Outpatient Encounter Medications as of 08/29/2017  Medication Sig  . acetaminophen (TYLENOL) 500 MG tablet Take 500 mg by mouth every 6 (six) hours as needed.  [place] Kitchen amLODipine (NORVASC) 5 MG tablet Take 1 tablet (5 mg total) by mouth daily.  Marland Kitchen aspirin 325 MG tablet Take 1 tablet (325 mg total) by  mouth at bedtime.  . betamethasone dipropionate (DIPROLENE) 0.05 % cream Apply 1 application topically 2 (two) times daily.  . bimatoprost (LUMIGAN) 0.01 % SOLN Place 1 drop into both eyes at bedtime.  . cyclobenzaprine (FLEXERIL) 5 MG tablet Take 1 tablet (5 mg total) by mouth 3 (three) times daily as needed for muscle spasms.  . furosemide (LASIX) 20 MG tablet Take 1 tablet (20 mg total) by mouth daily.  Marland Kitchen gabapentin (NEURONTIN) 100 MG capsule Take 1 capsule (100 mg total) by mouth 3 (three) times daily.  Marland Kitchen HYDROcodone-acetaminophen (NORCO/VICODIN) 5-325 MG tablet Take 1 tablet by mouth every 6 (six) hours as needed for moderate pain or severe pain.  Marland Kitchen ibuprofen (ADVIL,MOTRIN) 400 MG tablet Take 1 tablet (400 mg total) by mouth every 8 (eight) hours as needed for moderate pain.  Marland Kitchen insulin aspart (NOVOLOG) 100 UNIT/ML injection Inject 3 Units See admin instructions into the skin. Take before lunch and dinner  . Insulin Glargine (LANTUS SOLOSTAR) 100 UNIT/ML Solostar Pen Inject 24 Units into the skin at bedtime.  Marland Kitchen lisinopril (PRINIVIL,ZESTRIL) 20 MG tablet Take 1 tablet (20 mg total) by mouth daily before breakfast.  . magnesium hydroxide (MILK OF MAGNESIA) 400 MG/5ML suspension Take 15 mLs by mouth daily as needed for mild constipation.  . metoprolol succinate (TOPROL-XL) 100 MG 24 hr tablet Take 1 tablet (100 mg total) by mouth daily before breakfast. Take with or immediately following a meal.  . ONGLYZA 5 MG TABS tablet Take 1 tablet (5 mg total) by mouth daily.  Marland Kitchen oxybutynin (DITROPAN) 5 MG tablet Take 1 tablet (5 mg total) by mouth daily.  . pantoprazole (PROTONIX) 40 MG tablet Take 1 tablet (40 mg total) by mouth daily.  . potassium chloride (K-DUR) 10 MEQ tablet Take 1 tablet (10 mEq total) by mouth daily.  . pravastatin (PRAVACHOL) 40 MG tablet Take 1 tablet (40 mg total) by mouth at bedtime.  . traMADol (ULTRAM) 50 MG tablet Take 2 tablets (100 mg total) by mouth every 8 (eight) hours as  needed for severe pain.  . [DISCONTINUED] amLODipine (NORVASC) 5 MG tablet Take 5 mg by mouth daily.  . [DISCONTINUED] betamethasone dipropionate (DIPROLENE) 0.05 % cream Apply 1 application 2 (two) times daily topically.  . [DISCONTINUED] bimatoprost (LUMIGAN) 0.01 % SOLN Place 1 drop into both eyes at bedtime.  . [DISCONTINUED] cyclobenzaprine (FLEXERIL) 5 MG tablet Take 1 tablet (5 mg total) by mouth 3 (three) times daily as needed for muscle spasms.  . [DISCONTINUED] furosemide (LASIX) 20 MG tablet Take 1 tablet (20 mg total) by mouth daily.  . [DISCONTINUED] gabapentin (NEURONTIN) 100 MG capsule Take 100 mg 3 (three) times daily by mouth.  . [DISCONTINUED] HYDROcodone-acetaminophen (NORCO/VICODIN) 5-325 MG tablet Take 1 tablet every 6 (six) hours as needed by mouth for moderate pain or severe pain.  . [DISCONTINUED] Insulin Glargine (LANTUS SOLOSTAR) 100 UNIT/ML Solostar Pen Inject 24 Units into the skin at bedtime.   . [DISCONTINUED] lisinopril (PRINIVIL,ZESTRIL) 20 MG tablet Take 20 mg by mouth daily before breakfast.   . [DISCONTINUED] metoprolol succinate (TOPROL-XL) 100  MG 24 hr tablet Take 100 mg by mouth daily before breakfast. Take with or immediately following a meal.  . [DISCONTINUED] ONGLYZA 5 MG TABS tablet Take 5 mg by mouth daily.  . [DISCONTINUED] oxybutynin (DITROPAN) 5 MG tablet Take 5 mg by mouth daily.  . [DISCONTINUED] pantoprazole (PROTONIX) 40 MG tablet Take 40 mg by mouth daily.   . [DISCONTINUED] potassium chloride (K-DUR) 10 MEQ tablet Take 10 mEq by mouth daily.  . [DISCONTINUED] pravastatin (PRAVACHOL) 40 MG tablet Take 40 mg by mouth at bedtime.   . [DISCONTINUED] traMADol (ULTRAM) 50 MG tablet Take 100 mg by mouth every 8 (eight) hours as needed for severe pain.  . blood glucose meter kit and supplies KIT Dispense based on patient and insurance preference. Use up to four times daily as directed. (FOR ICD-9 250.00, 250.01).  . insulin aspart (NOVOLOG) 100 UNIT/ML  FlexPen Inject 3 Units into the skin See admin instructions. Before lunch and dinner.   No facility-administered encounter medications on file as of 08/29/2017.     Review of Systems  GENERAL: No change in appetite, no fatigue, no weight changes, no fever, chills or weakness MOUTH and THROAT: Denies oral discomfort, gingival pain  RESPIRATORY: no cough, SOB, DOE, wheezing, hemoptysis CARDIAC: No chest pain, edema or palpitations GI: No abdominal pain, diarrhea, constipation, heart burn, nausea or vomiting GU: Denies dysuria, frequency, hematuria, incontinence, or discharge PSYCHIATRIC: Denies feelings of depression or anxiety. No report of hallucinations, insomnia, paranoia, or agitation   Immunization History  Administered Date(s) Administered  . Influenza,inj,Quad PF,6+ Mos 07/15/2017  . Pneumococcal Polysaccharide-23 07/15/2017   Pertinent  Health Maintenance Due  Topic Date Due  . FOOT EXAM  03/27/1964  . OPHTHALMOLOGY EXAM  03/27/1964  . PAP SMEAR  03/28/1975  . MAMMOGRAM  11/23/2017  . HEMOGLOBIN A1C  01/10/2018  . COLONOSCOPY  09/12/2026  . INFLUENZA VACCINE  Completed   Fall Risk  08/01/2017  Falls in the past year? Yes  Number falls in past yr: 1  Injury with Fall? Yes  Risk Factor Category  High Fall Risk  Risk for fall due to : Impaired mobility  Follow up Education provided      Vitals:   08/29/17 1004  BP: 116/74  Pulse: 88  Resp: 20  Temp: 98.1 F (36.7 C)  TempSrc: Oral  SpO2: 94%  Weight: 297 lb 9.6 oz (135 kg)  Height: 5' 3" (1.6 m)   Body mass index is 52.72 kg/m.   Physical Exam  GENERAL APPEARANCE: Well nourished. In no acute distress. Morbidly obese SKIN:  Bilateral lower legs dark discoloration MOUTH and THROAT: Lips are without lesions. Oral mucosa is moist and without lesions.  RESPIRATORY: Breathing is even & unlabored, BS CTAB CARDIAC: RRR, no murmur,no extra heart sounds, no edema GI: Abdomen soft, normal BS, no masses, no  tenderness, no hepatomegaly, no splenomegaly EXTREMITIES: Able to move X 4 extremities PSYCHIATRIC: Alert and oriented X 3. Affect and behavior are appropriate   Labs reviewed: Recent Labs    07/13/17 0302 07/14/17 0535 07/15/17 0335 07/28/17 0626  NA 139 138 139 138  K 3.2* 3.6 3.6 4.2  CL 106 104 104 102  CO2 _0 --   GLUCOSE 188* 237* 212* 271*  BUN 5* _1 CREATININE 0.50 0.52 0.50 0.60  CALCIUM 8.8* 8.6* 8.5*  --     Recent Labs    07/12/17 1525 07/28/17 0612 07/28/17 0626  WBC 15.9* 10.0  --  NEUTROABS  --  6.6  --   HGB 11.8* 11.7* 12.6  HCT 37.2 37.2 37.0  MCV 82.7 83.2  --   PLT 236 329  --    Lab Results  Component Value Date   TSH 3.348 11/16/2011   Lab Results  Component Value Date   HGBA1C 7.6 (H) 07/12/2017    Significant Diagnostic Results in last 30 days:  Xr Shoulder Right  Result Date: 08/26/2017 AP outlet right shoulder reviewed.  Axillary lateral review.  Continue fracture consolidation is noted of proximal humerus fracture no dislocation or complicating features.  Soft tissue envelope is large and inhibits examination of  bony detail  Xr Shoulder Right  Result Date: 08/14/2017 AP outlet right shoulder reviewed.  Again noted is minimally displaced proximal humerus fracture.  Some evidence of calcification noted.  There is no dislocation.  The visualized lung fields clear   Assessment/Plan  1. Closed nondisplaced fracture of surgical neck of right humerus with routine healing, unspecified fracture morphology, subsequent encounter - for Home health PT and OT, for therapeutic strengthening exercises, fall precautions - cyclobenzaprine (FLEXERIL) 5 MG tablet; Take 1 tablet (5 mg total) by mouth 3 (three) times daily as needed for muscle spasms.  Dispense: 30 tablet; Refill: 0 - HYDROcodone-acetaminophen (NORCO/VICODIN) 5-325 MG tablet; Take 1 tablet by mouth every 6 (six) hours as needed for moderate pain or severe pain.  Dispense:  30 tablet; Refill: 0 - traMADol (ULTRAM) 50 MG tablet; Take 2 tablets (100 mg total) by mouth every 8 (eight) hours as needed for severe pain.  Dispense: 30 tablet; Refill: 0  2. Chronic systolic heart failure (HCC) - no SOB - furosemide (LASIX) 20 MG tablet; Take 1 tablet (20 mg total) by mouth daily.  Dispense: 30 tablet; Refill: 0 - lisinopril (PRINIVIL,ZESTRIL) 20 MG tablet; Take 1 tablet (20 mg total) by mouth daily before breakfast.  Dispense: 30 tablet; Refill: 0 - metoprolol succinate (TOPROL-XL) 100 MG 24 hr tablet; Take 1 tablet (100 mg total) by mouth daily before breakfast. Take with or immediately following a meal.  Dispense: 30 tablet; Refill: 0  3. Uncontrolled type 2 diabetes mellitus with hyperglycemia (HCC) Lab Results  Component Value Date   HGBA1C 7.6 (H) 07/12/2017   - Insulin Glargine (LANTUS SOLOSTAR) 100 UNIT/ML Solostar Pen; Inject 24 Units into the skin at bedtime.  Dispense: 15 mL; Refill: 0 - ONGLYZA 5 MG TABS tablet; Take 1 tablet (5 mg total) by mouth daily.  Dispense: 30 tablet; Refill: 0 - insulin aspart (NOVOLOG) 100 UNIT/ML FlexPen; Inject 3 Units into the skin See admin instructions. Before lunch and dinner.  Dispense: 15 mL; Refill: 0 - blood glucose meter kit and supplies KIT; Dispense based on patient and insurance preference. Use up to four times daily as directed. (FOR ICD-9 250.00, 250.01).  Dispense: 1 each; Refill: 0  4. Essential hypertension -well-controlled - amLODipine (NORVASC) 5 MG tablet; Take 1 tablet (5 mg total) by mouth daily.  Dispense: 30 tablet; Refill: 0 - lisinopril (PRINIVIL,ZESTRIL) 20 MG tablet; Take 1 tablet (20 mg total) by mouth daily before breakfast.  Dispense: 30 tablet; Refill: 0 - metoprolol succinate (TOPROL-XL) 100 MG 24 hr tablet; Take 1 tablet (100 mg total) by mouth daily before breakfast. Take with or immediately following a meal.  Dispense: 30 tablet; Refill: 0  5. Hyperlipidemia, unspecified hyperlipidemia  type Continue Pravastatin 40 mg 1 tab PO Q HS   6. Neuropathy -stable - gabapentin (NEURONTIN) 100 MG capsule; Take  1 capsule (100 mg total) by mouth 3 (three) times daily.  Dispense: 90 capsule; Refill: 0  7. Hypokalemia - continue KCL ER 10 meq 1 tab daily Lab Results  Component Value Date   K 4.2 07/28/2017   - potassium chloride (K-DUR) 10 MEQ tablet; Take 1 tablet (10 mEq total) by mouth daily.  Dispense: 30 tablet; Refill: 0  8. History of cardiac arrest - ASA 325 mg daily - pravastatin (PRAVACHOL) 40 MG tablet; Take 1 tablet (40 mg total) by mouth at bedtime.  Dispense: 30 tablet; Refill: 0  9. Urinary frequency - oxybutynin (DITROPAN) 5 MG tablet; Take 1 tablet (5 mg total) by mouth daily.  Dispense: 30 tablet; Refill: 0     I have filled out patient's discharge paperwork and written prescriptions.  Patient will receive home health PT, OT, and Nursing.  DME provided: None  Total discharge time: Greater than 30 minutes Greater than 50% was spent in counseling and coordination of care.    Discharge time involved coordination of the discharge process with social worker, nursing staff and therapy department. Medical justification for home health services/ verified.    C. Medina-Vargas - NP [facility] [phone]  

## 2017-08-30 ENCOUNTER — Other Ambulatory Visit: Payer: Self-pay | Admitting: Licensed Clinical Social Worker

## 2017-08-30 NOTE — Patient Outreach (Signed)
[name]) Care Management  08/30/2017  Whitney [name] 05/20/1956 [id number]  Assessment- Patient will be discharging back home from SNF today on 08/30/17. CSW completed order for [facility] RNCM to assist with diabetes management.   Plan-CSW will follow up within one week. Mobile Meals and Home Care Providers referrals placed in order to assist with transition back home.  Eula Fried, BSW, MSW, [email or web address] Phone: [phone] Fax: [phone]

## 2017-09-02 ENCOUNTER — Other Ambulatory Visit: Payer: Self-pay | Admitting: *Deleted

## 2017-09-02 ENCOUNTER — Other Ambulatory Visit: Payer: Self-pay | Admitting: Licensed Clinical Social Worker

## 2017-09-02 ENCOUNTER — Other Ambulatory Visit: Payer: Self-pay

## 2017-09-02 DIAGNOSIS — I2510 Atherosclerotic heart disease of native coronary artery without angina pectoris: Secondary | ICD-10-CM | POA: Diagnosis not present

## 2017-09-02 DIAGNOSIS — E1165 Type 2 diabetes mellitus with hyperglycemia: Secondary | ICD-10-CM | POA: Diagnosis not present

## 2017-09-02 DIAGNOSIS — Z6841 Body Mass Index (BMI) 40.0 and over, adult: Secondary | ICD-10-CM | POA: Diagnosis not present

## 2017-09-02 DIAGNOSIS — S42211D Unspecified displaced fracture of surgical neck of right humerus, subsequent encounter for fracture with routine healing: Secondary | ICD-10-CM | POA: Diagnosis not present

## 2017-09-02 DIAGNOSIS — Z79891 Long term (current) use of opiate analgesic: Secondary | ICD-10-CM | POA: Diagnosis not present

## 2017-09-02 DIAGNOSIS — W19XXXD Unspecified fall, subsequent encounter: Secondary | ICD-10-CM | POA: Diagnosis not present

## 2017-09-02 DIAGNOSIS — Z794 Long term (current) use of insulin: Secondary | ICD-10-CM | POA: Diagnosis not present

## 2017-09-02 DIAGNOSIS — H409 Unspecified glaucoma: Secondary | ICD-10-CM | POA: Diagnosis not present

## 2017-09-02 DIAGNOSIS — M25561 Pain in right knee: Secondary | ICD-10-CM | POA: Diagnosis not present

## 2017-09-02 DIAGNOSIS — I5022 Chronic systolic (congestive) heart failure: Secondary | ICD-10-CM | POA: Diagnosis not present

## 2017-09-02 DIAGNOSIS — E1140 Type 2 diabetes mellitus with diabetic neuropathy, unspecified: Secondary | ICD-10-CM | POA: Diagnosis not present

## 2017-09-02 DIAGNOSIS — Z9181 History of falling: Secondary | ICD-10-CM | POA: Diagnosis not present

## 2017-09-02 DIAGNOSIS — I110 Hypertensive heart disease with heart failure: Secondary | ICD-10-CM | POA: Diagnosis not present

## 2017-09-02 DIAGNOSIS — M1712 Unilateral primary osteoarthritis, left knee: Secondary | ICD-10-CM | POA: Diagnosis not present

## 2017-09-02 MED ORDER — BLOOD GLUCOSE MONITOR KIT: PACK | 0 refills | Status: AC

## 2017-09-02 MED ORDER — PEN NEEDLES 32G X 5 MM MISC: 90.0000 | Freq: Two times a day (BID) | 0 refills | Status: AC

## 2017-09-02 NOTE — Patient Outreach (Signed)
  [place] [facility]) Care Management  09/02/2017  Whitney [name] 03-Feb-1956 [id number]  Assessment- CSW completed outreach call to patient and she answered. She reports that she is doing well and is unsure if she needs Home Care Providers anymore. CSW completed call to [name] and provided update. If patient changes her mind then CSW will update Home Care Providers. Patient is only wanting Mobile Meals now. Patient reports that [facility] services are starting today.  Plan-CSW will follow up again this week to make sure patient does not want Home Care Providers involvement.  Eula Fried, BSW, MSW, [email or web address] Phone: [phone] Fax: [phone]

## 2017-09-02 NOTE — Patient Outreach (Signed)
[facility]) Care Management  09/02/2017  Whitney [name] 09-Oct-1955 [id number]   Referral received from LCSW for transition of care as member was discharged from SNF/rehab facility on 11/30.  She was recently hospitalized 10/12-10/15 after a fall resulting in a humerus fracture, discharged to SNF.  Per chart, she also has history of heart failure, diabetes, and sleep apnea.   Call placed to member for transition of care, no answer.  HIPAA compliant voice message left.  Will await call back, if no call back will follow up tomorrow.  Valente David, MSN, RN [facility] Care Management  [facility] Manager [phone]

## 2017-09-03 ENCOUNTER — Other Ambulatory Visit: Payer: Self-pay | Admitting: *Deleted

## 2017-09-03 DIAGNOSIS — E1165 Type 2 diabetes mellitus with hyperglycemia: Secondary | ICD-10-CM | POA: Diagnosis not present

## 2017-09-03 DIAGNOSIS — M25561 Pain in right knee: Secondary | ICD-10-CM | POA: Diagnosis not present

## 2017-09-03 DIAGNOSIS — I2510 Atherosclerotic heart disease of native coronary artery without angina pectoris: Secondary | ICD-10-CM | POA: Diagnosis not present

## 2017-09-03 DIAGNOSIS — I110 Hypertensive heart disease with heart failure: Secondary | ICD-10-CM | POA: Diagnosis not present

## 2017-09-03 DIAGNOSIS — M1712 Unilateral primary osteoarthritis, left knee: Secondary | ICD-10-CM | POA: Diagnosis not present

## 2017-09-03 DIAGNOSIS — S42211D Unspecified displaced fracture of surgical neck of right humerus, subsequent encounter for fracture with routine healing: Secondary | ICD-10-CM | POA: Diagnosis not present

## 2017-09-03 NOTE — Patient Outreach (Signed)
[place] [facility]) Care Management  09/03/2017  Whitney [name] [birth year] [id number]   2nd attempt made to contact member for transition of care.  She immediately begin talking about the home health agency, [facility], making a visit today for initial assessment, state they will return tomorrow. She then state she does not have time to talk due to receiving another call.  This care manager will follow up within the next 2 days.  Unable to complete transition of care assessment, medication review, and care plan at this time.  Valente David, MSN, RN [facility] Care Management  [facility] Manager [phone]

## 2017-09-04 ENCOUNTER — Other Ambulatory Visit: Payer: Self-pay | Admitting: Licensed Clinical Social Worker

## 2017-09-04 DIAGNOSIS — E1165 Type 2 diabetes mellitus with hyperglycemia: Secondary | ICD-10-CM | POA: Diagnosis not present

## 2017-09-04 DIAGNOSIS — M1712 Unilateral primary osteoarthritis, left knee: Secondary | ICD-10-CM | POA: Diagnosis not present

## 2017-09-04 DIAGNOSIS — I2510 Atherosclerotic heart disease of native coronary artery without angina pectoris: Secondary | ICD-10-CM | POA: Diagnosis not present

## 2017-09-04 DIAGNOSIS — M25561 Pain in right knee: Secondary | ICD-10-CM | POA: Diagnosis not present

## 2017-09-04 DIAGNOSIS — I110 Hypertensive heart disease with heart failure: Secondary | ICD-10-CM | POA: Diagnosis not present

## 2017-09-04 DIAGNOSIS — S42211D Unspecified displaced fracture of surgical neck of right humerus, subsequent encounter for fracture with routine healing: Secondary | ICD-10-CM | POA: Diagnosis not present

## 2017-09-04 NOTE — Patient Outreach (Signed)
[facility] [facility]) Care Management  09/04/2017  EZELLA KELL July 10, 1956 [id number]  Assessment- CSW completed outreach call to patient to check on Mobile Meals. Patient answered. She reports that [facility] nurse is currently there and she cannot talk. However, patient was able to confirm that Mobile Meals started yesterday. Patient was reminded that CSW will be out of the office starting tomorrow until 09/09/17.   Plan-CSW will follow up within two weeks.  Eula Fried, BSW, MSW, [email or web address] Phone: [phone] Fax: [phone]

## 2017-09-05 ENCOUNTER — Other Ambulatory Visit: Payer: Self-pay | Admitting: *Deleted

## 2017-09-05 DIAGNOSIS — I10 Essential (primary) hypertension: Secondary | ICD-10-CM | POA: Diagnosis not present

## 2017-09-05 DIAGNOSIS — E1165 Type 2 diabetes mellitus with hyperglycemia: Secondary | ICD-10-CM | POA: Diagnosis not present

## 2017-09-05 DIAGNOSIS — M170 Bilateral primary osteoarthritis of knee: Secondary | ICD-10-CM | POA: Diagnosis not present

## 2017-09-05 DIAGNOSIS — S42309D Unspecified fracture of shaft of humerus, unspecified arm, subsequent encounter for fracture with routine healing: Secondary | ICD-10-CM | POA: Diagnosis not present

## 2017-09-05 DIAGNOSIS — I5022 Chronic systolic (congestive) heart failure: Secondary | ICD-10-CM | POA: Diagnosis not present

## 2017-09-05 NOTE — Patient Outreach (Signed)
[place] [facility]) Care Management  09/05/2017  LASHEBA STEVENS 1956/02/26 [id number]   3rd attempt made to initiate transition of care program/assesssment, unsuccessful.  HIPAA compliant voice message left.  Will await call back.  Since successful contact was made with second attempt but member was unable to talk at that time, will make another outreach tomorrow.  If unsuccessful, will send outreach letter to the home.  Valente David, MSN, RN [facility] Care Management  [facility] Manager [phone]

## 2017-09-06 ENCOUNTER — Other Ambulatory Visit: Payer: Self-pay | Admitting: *Deleted

## 2017-09-06 ENCOUNTER — Encounter: Payer: Self-pay | Admitting: *Deleted

## 2017-09-06 DIAGNOSIS — E1165 Type 2 diabetes mellitus with hyperglycemia: Secondary | ICD-10-CM | POA: Diagnosis not present

## 2017-09-06 DIAGNOSIS — M1712 Unilateral primary osteoarthritis, left knee: Secondary | ICD-10-CM | POA: Diagnosis not present

## 2017-09-06 DIAGNOSIS — I2510 Atherosclerotic heart disease of native coronary artery without angina pectoris: Secondary | ICD-10-CM | POA: Diagnosis not present

## 2017-09-06 DIAGNOSIS — S42211D Unspecified displaced fracture of surgical neck of right humerus, subsequent encounter for fracture with routine healing: Secondary | ICD-10-CM | POA: Diagnosis not present

## 2017-09-06 DIAGNOSIS — I110 Hypertensive heart disease with heart failure: Secondary | ICD-10-CM | POA: Diagnosis not present

## 2017-09-06 DIAGNOSIS — M25561 Pain in right knee: Secondary | ICD-10-CM | POA: Diagnosis not present

## 2017-09-06 NOTE — Patient Outreach (Signed)
[place] [facility]) Care Management  09/06/2017  CARINNE BRANDENBURGER 06/19/1956 [id number]   4th attempt made to engage member in [facility] care management services/transtion of care program.  She state she is unable talk at this time as her home health nurse just left and her physical therapist will be arriving.  Also state she is waiting for her mobile meals to be delivered.  This care manager has been unable to engage member, unable to complete transition of care assessment.  Will send outreach letter to member with contact information for this care manager.  Will also collaborate with LCSW regarding multiple attempts to engage.  If no contact from member within the next 10 business days, will close case.  Valente David, [place], MSN [place] [phone]

## 2017-09-09 ENCOUNTER — Other Ambulatory Visit: Payer: Self-pay | Admitting: Licensed Clinical Social Worker

## 2017-09-09 NOTE — Patient Outreach (Signed)
[place] [facility]) Care Management  09/09/2017  Whitney [name] [birth year] [id number]  Assessment- CSW to completed outreach call to patient and she answered. HIPPA verifications provided.CSW informed patient that [facility] RNCM has been trying to reach her but has been unsuccessful in establishing contact with patient. Patient shares that she has everything she needs and states that she does not need Home Care Providers involvement or [place] but is extremely appreciative for all social work assistance provided to her in order to assist with a safe transition back home from SNF. Patient is agreeable to case closure at this time. Mobile Meals has already started and patient feels comfortable with preparing meals on her own once her 30 day supply has ended. CSW will complete case closure and will update [facility] RNCM.   Plan-CSW will complete case closure at this time and notify PCP and [place] Management Assistant.  Eula Fried, BSW, MSW, [email or web address] Phone: [phone] Fax: [phone]

## 2017-09-10 ENCOUNTER — Other Ambulatory Visit: Payer: Self-pay | Admitting: *Deleted

## 2017-09-10 NOTE — Patient Outreach (Signed)
[facility] [facility] Care Management  09/10/2017  MARY-ANN PENNELLA 1956-05-09 [id number]   Notified by LCSW that member declined further assistance from [facility] care management services.  MD case closure letter sent per LCSW, will close case at this time.  Valente David, MSN, RN [facility] Care Management  [facility] Manager [phone]

## 2017-09-11 DIAGNOSIS — S42211D Unspecified displaced fracture of surgical neck of right humerus, subsequent encounter for fracture with routine healing: Secondary | ICD-10-CM | POA: Diagnosis not present

## 2017-09-11 DIAGNOSIS — I2510 Atherosclerotic heart disease of native coronary artery without angina pectoris: Secondary | ICD-10-CM | POA: Diagnosis not present

## 2017-09-11 DIAGNOSIS — M1712 Unilateral primary osteoarthritis, left knee: Secondary | ICD-10-CM | POA: Diagnosis not present

## 2017-09-11 DIAGNOSIS — E1165 Type 2 diabetes mellitus with hyperglycemia: Secondary | ICD-10-CM | POA: Diagnosis not present

## 2017-09-11 DIAGNOSIS — I110 Hypertensive heart disease with heart failure: Secondary | ICD-10-CM | POA: Diagnosis not present

## 2017-09-11 DIAGNOSIS — M25561 Pain in right knee: Secondary | ICD-10-CM | POA: Diagnosis not present

## 2017-09-13 DIAGNOSIS — I2510 Atherosclerotic heart disease of native coronary artery without angina pectoris: Secondary | ICD-10-CM | POA: Diagnosis not present

## 2017-09-13 DIAGNOSIS — M1712 Unilateral primary osteoarthritis, left knee: Secondary | ICD-10-CM | POA: Diagnosis not present

## 2017-09-13 DIAGNOSIS — E1165 Type 2 diabetes mellitus with hyperglycemia: Secondary | ICD-10-CM | POA: Diagnosis not present

## 2017-09-13 DIAGNOSIS — M25561 Pain in right knee: Secondary | ICD-10-CM | POA: Diagnosis not present

## 2017-09-13 DIAGNOSIS — S42211D Unspecified displaced fracture of surgical neck of right humerus, subsequent encounter for fracture with routine healing: Secondary | ICD-10-CM | POA: Diagnosis not present

## 2017-09-13 DIAGNOSIS — I110 Hypertensive heart disease with heart failure: Secondary | ICD-10-CM | POA: Diagnosis not present

## 2017-09-18 DIAGNOSIS — I2510 Atherosclerotic heart disease of native coronary artery without angina pectoris: Secondary | ICD-10-CM | POA: Diagnosis not present

## 2017-09-18 DIAGNOSIS — S42211D Unspecified displaced fracture of surgical neck of right humerus, subsequent encounter for fracture with routine healing: Secondary | ICD-10-CM | POA: Diagnosis not present

## 2017-09-18 DIAGNOSIS — E1165 Type 2 diabetes mellitus with hyperglycemia: Secondary | ICD-10-CM | POA: Diagnosis not present

## 2017-09-18 DIAGNOSIS — M25561 Pain in right knee: Secondary | ICD-10-CM | POA: Diagnosis not present

## 2017-09-18 DIAGNOSIS — M1712 Unilateral primary osteoarthritis, left knee: Secondary | ICD-10-CM | POA: Diagnosis not present

## 2017-09-18 DIAGNOSIS — I110 Hypertensive heart disease with heart failure: Secondary | ICD-10-CM | POA: Diagnosis not present

## 2017-09-19 DIAGNOSIS — E1165 Type 2 diabetes mellitus with hyperglycemia: Secondary | ICD-10-CM | POA: Diagnosis not present

## 2017-09-19 DIAGNOSIS — M792 Neuralgia and neuritis, unspecified: Secondary | ICD-10-CM | POA: Diagnosis not present

## 2017-09-19 DIAGNOSIS — Z6841 Body Mass Index (BMI) 40.0 and over, adult: Secondary | ICD-10-CM | POA: Diagnosis not present

## 2017-09-19 DIAGNOSIS — I10 Essential (primary) hypertension: Secondary | ICD-10-CM | POA: Diagnosis not present

## 2017-09-20 DIAGNOSIS — I2510 Atherosclerotic heart disease of native coronary artery without angina pectoris: Secondary | ICD-10-CM | POA: Diagnosis not present

## 2017-09-20 DIAGNOSIS — I110 Hypertensive heart disease with heart failure: Secondary | ICD-10-CM | POA: Diagnosis not present

## 2017-09-20 DIAGNOSIS — M1712 Unilateral primary osteoarthritis, left knee: Secondary | ICD-10-CM | POA: Diagnosis not present

## 2017-09-20 DIAGNOSIS — E1165 Type 2 diabetes mellitus with hyperglycemia: Secondary | ICD-10-CM | POA: Diagnosis not present

## 2017-09-20 DIAGNOSIS — M25561 Pain in right knee: Secondary | ICD-10-CM | POA: Diagnosis not present

## 2017-09-20 DIAGNOSIS — S42211D Unspecified displaced fracture of surgical neck of right humerus, subsequent encounter for fracture with routine healing: Secondary | ICD-10-CM | POA: Diagnosis not present

## 2017-09-27 ENCOUNTER — Other Ambulatory Visit: Payer: Self-pay | Admitting: Adult Health

## 2017-09-27 DIAGNOSIS — M1712 Unilateral primary osteoarthritis, left knee: Secondary | ICD-10-CM | POA: Diagnosis not present

## 2017-09-27 DIAGNOSIS — E1165 Type 2 diabetes mellitus with hyperglycemia: Secondary | ICD-10-CM | POA: Diagnosis not present

## 2017-09-27 DIAGNOSIS — I110 Hypertensive heart disease with heart failure: Secondary | ICD-10-CM | POA: Diagnosis not present

## 2017-09-27 DIAGNOSIS — M25561 Pain in right knee: Secondary | ICD-10-CM | POA: Diagnosis not present

## 2017-09-27 DIAGNOSIS — I2510 Atherosclerotic heart disease of native coronary artery without angina pectoris: Secondary | ICD-10-CM | POA: Diagnosis not present

## 2017-09-27 DIAGNOSIS — S42211D Unspecified displaced fracture of surgical neck of right humerus, subsequent encounter for fracture with routine healing: Secondary | ICD-10-CM | POA: Diagnosis not present

## 2017-09-30 DIAGNOSIS — M1712 Unilateral primary osteoarthritis, left knee: Secondary | ICD-10-CM | POA: Diagnosis not present

## 2017-09-30 DIAGNOSIS — E1165 Type 2 diabetes mellitus with hyperglycemia: Secondary | ICD-10-CM | POA: Diagnosis not present

## 2017-09-30 DIAGNOSIS — S42211D Unspecified displaced fracture of surgical neck of right humerus, subsequent encounter for fracture with routine healing: Secondary | ICD-10-CM | POA: Diagnosis not present

## 2017-09-30 DIAGNOSIS — I110 Hypertensive heart disease with heart failure: Secondary | ICD-10-CM | POA: Diagnosis not present

## 2017-09-30 DIAGNOSIS — I2510 Atherosclerotic heart disease of native coronary artery without angina pectoris: Secondary | ICD-10-CM | POA: Diagnosis not present

## 2017-09-30 DIAGNOSIS — M25561 Pain in right knee: Secondary | ICD-10-CM | POA: Diagnosis not present

## 2017-10-02 DIAGNOSIS — M1712 Unilateral primary osteoarthritis, left knee: Secondary | ICD-10-CM | POA: Diagnosis not present

## 2017-10-02 DIAGNOSIS — I110 Hypertensive heart disease with heart failure: Secondary | ICD-10-CM | POA: Diagnosis not present

## 2017-10-02 DIAGNOSIS — E1140 Type 2 diabetes mellitus with diabetic neuropathy, unspecified: Secondary | ICD-10-CM | POA: Diagnosis not present

## 2017-10-02 DIAGNOSIS — H409 Unspecified glaucoma: Secondary | ICD-10-CM | POA: Diagnosis not present

## 2017-10-02 DIAGNOSIS — I5022 Chronic systolic (congestive) heart failure: Secondary | ICD-10-CM | POA: Diagnosis not present

## 2017-10-02 DIAGNOSIS — I2510 Atherosclerotic heart disease of native coronary artery without angina pectoris: Secondary | ICD-10-CM | POA: Diagnosis not present

## 2017-10-02 DIAGNOSIS — S42211D Unspecified displaced fracture of surgical neck of right humerus, subsequent encounter for fracture with routine healing: Secondary | ICD-10-CM | POA: Diagnosis not present

## 2017-10-02 DIAGNOSIS — E1165 Type 2 diabetes mellitus with hyperglycemia: Secondary | ICD-10-CM | POA: Diagnosis not present

## 2017-10-02 DIAGNOSIS — M25561 Pain in right knee: Secondary | ICD-10-CM | POA: Diagnosis not present

## 2017-10-04 DIAGNOSIS — E1165 Type 2 diabetes mellitus with hyperglycemia: Secondary | ICD-10-CM | POA: Diagnosis not present

## 2017-10-04 DIAGNOSIS — I110 Hypertensive heart disease with heart failure: Secondary | ICD-10-CM | POA: Diagnosis not present

## 2017-10-04 DIAGNOSIS — M1712 Unilateral primary osteoarthritis, left knee: Secondary | ICD-10-CM | POA: Diagnosis not present

## 2017-10-04 DIAGNOSIS — M25561 Pain in right knee: Secondary | ICD-10-CM | POA: Diagnosis not present

## 2017-10-04 DIAGNOSIS — I5022 Chronic systolic (congestive) heart failure: Secondary | ICD-10-CM | POA: Diagnosis not present

## 2017-10-04 DIAGNOSIS — E1140 Type 2 diabetes mellitus with diabetic neuropathy, unspecified: Secondary | ICD-10-CM | POA: Diagnosis not present

## 2017-10-04 DIAGNOSIS — S42211D Unspecified displaced fracture of surgical neck of right humerus, subsequent encounter for fracture with routine healing: Secondary | ICD-10-CM | POA: Diagnosis not present

## 2017-10-04 DIAGNOSIS — I2510 Atherosclerotic heart disease of native coronary artery without angina pectoris: Secondary | ICD-10-CM | POA: Diagnosis not present

## 2017-10-04 DIAGNOSIS — H409 Unspecified glaucoma: Secondary | ICD-10-CM | POA: Diagnosis not present

## 2017-10-08 DIAGNOSIS — I5022 Chronic systolic (congestive) heart failure: Secondary | ICD-10-CM | POA: Diagnosis not present

## 2017-10-08 DIAGNOSIS — E1140 Type 2 diabetes mellitus with diabetic neuropathy, unspecified: Secondary | ICD-10-CM | POA: Diagnosis not present

## 2017-10-08 DIAGNOSIS — I110 Hypertensive heart disease with heart failure: Secondary | ICD-10-CM | POA: Diagnosis not present

## 2017-10-08 DIAGNOSIS — S42211D Unspecified displaced fracture of surgical neck of right humerus, subsequent encounter for fracture with routine healing: Secondary | ICD-10-CM | POA: Diagnosis not present

## 2017-10-08 DIAGNOSIS — M1712 Unilateral primary osteoarthritis, left knee: Secondary | ICD-10-CM | POA: Diagnosis not present

## 2017-10-08 DIAGNOSIS — E1165 Type 2 diabetes mellitus with hyperglycemia: Secondary | ICD-10-CM | POA: Diagnosis not present

## 2017-10-08 DIAGNOSIS — I2510 Atherosclerotic heart disease of native coronary artery without angina pectoris: Secondary | ICD-10-CM | POA: Diagnosis not present

## 2017-10-08 DIAGNOSIS — M25561 Pain in right knee: Secondary | ICD-10-CM | POA: Diagnosis not present

## 2017-10-08 DIAGNOSIS — H409 Unspecified glaucoma: Secondary | ICD-10-CM | POA: Diagnosis not present

## 2017-10-09 DIAGNOSIS — M25561 Pain in right knee: Secondary | ICD-10-CM | POA: Diagnosis not present

## 2017-10-09 DIAGNOSIS — I5022 Chronic systolic (congestive) heart failure: Secondary | ICD-10-CM | POA: Diagnosis not present

## 2017-10-09 DIAGNOSIS — E1165 Type 2 diabetes mellitus with hyperglycemia: Secondary | ICD-10-CM | POA: Diagnosis not present

## 2017-10-09 DIAGNOSIS — I110 Hypertensive heart disease with heart failure: Secondary | ICD-10-CM | POA: Diagnosis not present

## 2017-10-09 DIAGNOSIS — H409 Unspecified glaucoma: Secondary | ICD-10-CM | POA: Diagnosis not present

## 2017-10-09 DIAGNOSIS — E1140 Type 2 diabetes mellitus with diabetic neuropathy, unspecified: Secondary | ICD-10-CM | POA: Diagnosis not present

## 2017-10-09 DIAGNOSIS — S42211D Unspecified displaced fracture of surgical neck of right humerus, subsequent encounter for fracture with routine healing: Secondary | ICD-10-CM | POA: Diagnosis not present

## 2017-10-09 DIAGNOSIS — M1712 Unilateral primary osteoarthritis, left knee: Secondary | ICD-10-CM | POA: Diagnosis not present

## 2017-10-09 DIAGNOSIS — I2510 Atherosclerotic heart disease of native coronary artery without angina pectoris: Secondary | ICD-10-CM | POA: Diagnosis not present

## 2017-10-10 DIAGNOSIS — M25561 Pain in right knee: Secondary | ICD-10-CM | POA: Diagnosis not present

## 2017-10-10 DIAGNOSIS — M1712 Unilateral primary osteoarthritis, left knee: Secondary | ICD-10-CM | POA: Diagnosis not present

## 2017-10-10 DIAGNOSIS — I5022 Chronic systolic (congestive) heart failure: Secondary | ICD-10-CM | POA: Diagnosis not present

## 2017-10-10 DIAGNOSIS — S42211D Unspecified displaced fracture of surgical neck of right humerus, subsequent encounter for fracture with routine healing: Secondary | ICD-10-CM | POA: Diagnosis not present

## 2017-10-10 DIAGNOSIS — H409 Unspecified glaucoma: Secondary | ICD-10-CM | POA: Diagnosis not present

## 2017-10-10 DIAGNOSIS — E1165 Type 2 diabetes mellitus with hyperglycemia: Secondary | ICD-10-CM | POA: Diagnosis not present

## 2017-10-10 DIAGNOSIS — I110 Hypertensive heart disease with heart failure: Secondary | ICD-10-CM | POA: Diagnosis not present

## 2017-10-10 DIAGNOSIS — I2510 Atherosclerotic heart disease of native coronary artery without angina pectoris: Secondary | ICD-10-CM | POA: Diagnosis not present

## 2017-10-10 DIAGNOSIS — E1140 Type 2 diabetes mellitus with diabetic neuropathy, unspecified: Secondary | ICD-10-CM | POA: Diagnosis not present

## 2017-10-11 DIAGNOSIS — M1712 Unilateral primary osteoarthritis, left knee: Secondary | ICD-10-CM | POA: Diagnosis not present

## 2017-10-11 DIAGNOSIS — M25561 Pain in right knee: Secondary | ICD-10-CM | POA: Diagnosis not present

## 2017-10-11 DIAGNOSIS — H409 Unspecified glaucoma: Secondary | ICD-10-CM | POA: Diagnosis not present

## 2017-10-11 DIAGNOSIS — I110 Hypertensive heart disease with heart failure: Secondary | ICD-10-CM | POA: Diagnosis not present

## 2017-10-11 DIAGNOSIS — I5022 Chronic systolic (congestive) heart failure: Secondary | ICD-10-CM | POA: Diagnosis not present

## 2017-10-11 DIAGNOSIS — S42211D Unspecified displaced fracture of surgical neck of right humerus, subsequent encounter for fracture with routine healing: Secondary | ICD-10-CM | POA: Diagnosis not present

## 2017-10-11 DIAGNOSIS — I2510 Atherosclerotic heart disease of native coronary artery without angina pectoris: Secondary | ICD-10-CM | POA: Diagnosis not present

## 2017-10-11 DIAGNOSIS — E1165 Type 2 diabetes mellitus with hyperglycemia: Secondary | ICD-10-CM | POA: Diagnosis not present

## 2017-10-11 DIAGNOSIS — E1140 Type 2 diabetes mellitus with diabetic neuropathy, unspecified: Secondary | ICD-10-CM | POA: Diagnosis not present

## 2017-10-16 DIAGNOSIS — I5022 Chronic systolic (congestive) heart failure: Secondary | ICD-10-CM | POA: Diagnosis not present

## 2017-10-16 DIAGNOSIS — H409 Unspecified glaucoma: Secondary | ICD-10-CM | POA: Diagnosis not present

## 2017-10-16 DIAGNOSIS — M25561 Pain in right knee: Secondary | ICD-10-CM | POA: Diagnosis not present

## 2017-10-16 DIAGNOSIS — S42211D Unspecified displaced fracture of surgical neck of right humerus, subsequent encounter for fracture with routine healing: Secondary | ICD-10-CM | POA: Diagnosis not present

## 2017-10-16 DIAGNOSIS — E1140 Type 2 diabetes mellitus with diabetic neuropathy, unspecified: Secondary | ICD-10-CM | POA: Diagnosis not present

## 2017-10-16 DIAGNOSIS — M1712 Unilateral primary osteoarthritis, left knee: Secondary | ICD-10-CM | POA: Diagnosis not present

## 2017-10-16 DIAGNOSIS — E1165 Type 2 diabetes mellitus with hyperglycemia: Secondary | ICD-10-CM | POA: Diagnosis not present

## 2017-10-16 DIAGNOSIS — I2510 Atherosclerotic heart disease of native coronary artery without angina pectoris: Secondary | ICD-10-CM | POA: Diagnosis not present

## 2017-10-16 DIAGNOSIS — I110 Hypertensive heart disease with heart failure: Secondary | ICD-10-CM | POA: Diagnosis not present

## 2017-10-18 ENCOUNTER — Other Ambulatory Visit: Payer: Self-pay | Admitting: Adult Health

## 2017-10-18 DIAGNOSIS — E1140 Type 2 diabetes mellitus with diabetic neuropathy, unspecified: Secondary | ICD-10-CM | POA: Diagnosis not present

## 2017-10-18 DIAGNOSIS — M25561 Pain in right knee: Secondary | ICD-10-CM | POA: Diagnosis not present

## 2017-10-18 DIAGNOSIS — I110 Hypertensive heart disease with heart failure: Secondary | ICD-10-CM | POA: Diagnosis not present

## 2017-10-18 DIAGNOSIS — I2510 Atherosclerotic heart disease of native coronary artery without angina pectoris: Secondary | ICD-10-CM | POA: Diagnosis not present

## 2017-10-18 DIAGNOSIS — R350 Frequency of micturition: Secondary | ICD-10-CM

## 2017-10-18 DIAGNOSIS — I5022 Chronic systolic (congestive) heart failure: Secondary | ICD-10-CM | POA: Diagnosis not present

## 2017-10-18 DIAGNOSIS — M1712 Unilateral primary osteoarthritis, left knee: Secondary | ICD-10-CM | POA: Diagnosis not present

## 2017-10-18 DIAGNOSIS — H409 Unspecified glaucoma: Secondary | ICD-10-CM | POA: Diagnosis not present

## 2017-10-18 DIAGNOSIS — E1165 Type 2 diabetes mellitus with hyperglycemia: Secondary | ICD-10-CM | POA: Diagnosis not present

## 2017-10-18 DIAGNOSIS — S42211D Unspecified displaced fracture of surgical neck of right humerus, subsequent encounter for fracture with routine healing: Secondary | ICD-10-CM | POA: Diagnosis not present

## 2017-10-18 DIAGNOSIS — I10 Essential (primary) hypertension: Secondary | ICD-10-CM

## 2017-10-23 DIAGNOSIS — E1165 Type 2 diabetes mellitus with hyperglycemia: Secondary | ICD-10-CM | POA: Diagnosis not present

## 2017-10-23 DIAGNOSIS — H409 Unspecified glaucoma: Secondary | ICD-10-CM | POA: Diagnosis not present

## 2017-10-23 DIAGNOSIS — M1712 Unilateral primary osteoarthritis, left knee: Secondary | ICD-10-CM | POA: Diagnosis not present

## 2017-10-23 DIAGNOSIS — I5022 Chronic systolic (congestive) heart failure: Secondary | ICD-10-CM | POA: Diagnosis not present

## 2017-10-23 DIAGNOSIS — M25561 Pain in right knee: Secondary | ICD-10-CM | POA: Diagnosis not present

## 2017-10-23 DIAGNOSIS — I2510 Atherosclerotic heart disease of native coronary artery without angina pectoris: Secondary | ICD-10-CM | POA: Diagnosis not present

## 2017-10-23 DIAGNOSIS — S42211D Unspecified displaced fracture of surgical neck of right humerus, subsequent encounter for fracture with routine healing: Secondary | ICD-10-CM | POA: Diagnosis not present

## 2017-10-23 DIAGNOSIS — E1140 Type 2 diabetes mellitus with diabetic neuropathy, unspecified: Secondary | ICD-10-CM | POA: Diagnosis not present

## 2017-10-23 DIAGNOSIS — I110 Hypertensive heart disease with heart failure: Secondary | ICD-10-CM | POA: Diagnosis not present

## 2017-10-25 DIAGNOSIS — I2510 Atherosclerotic heart disease of native coronary artery without angina pectoris: Secondary | ICD-10-CM | POA: Diagnosis not present

## 2017-10-25 DIAGNOSIS — E1140 Type 2 diabetes mellitus with diabetic neuropathy, unspecified: Secondary | ICD-10-CM | POA: Diagnosis not present

## 2017-10-25 DIAGNOSIS — E1165 Type 2 diabetes mellitus with hyperglycemia: Secondary | ICD-10-CM | POA: Diagnosis not present

## 2017-10-25 DIAGNOSIS — S42211D Unspecified displaced fracture of surgical neck of right humerus, subsequent encounter for fracture with routine healing: Secondary | ICD-10-CM | POA: Diagnosis not present

## 2017-10-25 DIAGNOSIS — H409 Unspecified glaucoma: Secondary | ICD-10-CM | POA: Diagnosis not present

## 2017-10-25 DIAGNOSIS — I110 Hypertensive heart disease with heart failure: Secondary | ICD-10-CM | POA: Diagnosis not present

## 2017-10-25 DIAGNOSIS — I5022 Chronic systolic (congestive) heart failure: Secondary | ICD-10-CM | POA: Diagnosis not present

## 2017-10-25 DIAGNOSIS — M25561 Pain in right knee: Secondary | ICD-10-CM | POA: Diagnosis not present

## 2017-10-25 DIAGNOSIS — M1712 Unilateral primary osteoarthritis, left knee: Secondary | ICD-10-CM | POA: Diagnosis not present

## 2017-10-28 ENCOUNTER — Other Ambulatory Visit: Payer: Self-pay | Admitting: Adult Health

## 2017-10-28 DIAGNOSIS — E1165 Type 2 diabetes mellitus with hyperglycemia: Secondary | ICD-10-CM

## 2017-11-01 DIAGNOSIS — H04123 Dry eye syndrome of bilateral lacrimal glands: Secondary | ICD-10-CM | POA: Diagnosis not present

## 2017-11-01 DIAGNOSIS — H401131 Primary open-angle glaucoma, bilateral, mild stage: Secondary | ICD-10-CM | POA: Diagnosis not present

## 2017-11-01 DIAGNOSIS — H16042 Marginal corneal ulcer, left eye: Secondary | ICD-10-CM | POA: Diagnosis not present

## 2017-11-02 IMAGING — US US BREAST LTD UNI LEFT INC AXILLA
1 series · 5 of 5 positions shown · non-contrast
Comparison: Previous exam(s).

ACR Breast Density Category a: The breast tissue is almost entirely
fatty.

CLINICAL DATA: Delayed six-month follow-up for right breast
calcifications. The patient was last seen for her initial screening
recall in Monday March, 2014.

EXAM:
DIGITAL DIAGNOSTIC BILATERAL MAMMOGRAM WITH 3D TOMOSYNTHESIS AND CAD
LEFT BREAST ULTRASOUND

[Series 1: us breast ltd uni left inc axilla · 0.06mm/px · 5 of 5 slices shown]
[im 1/5]
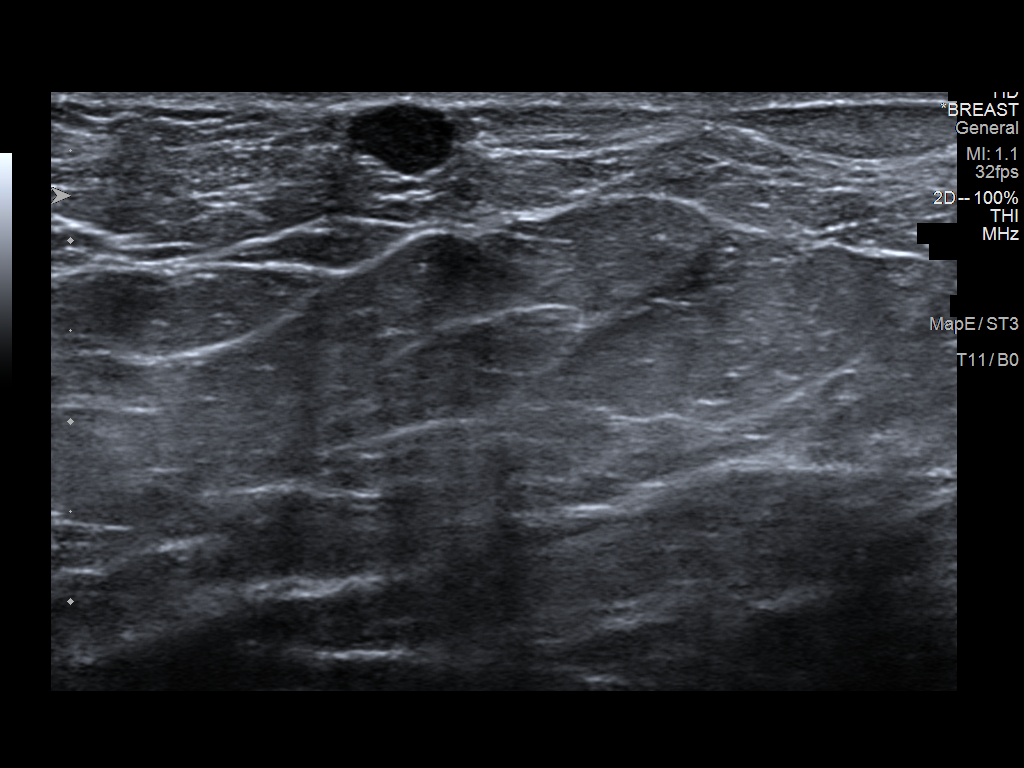
[im 2/5]
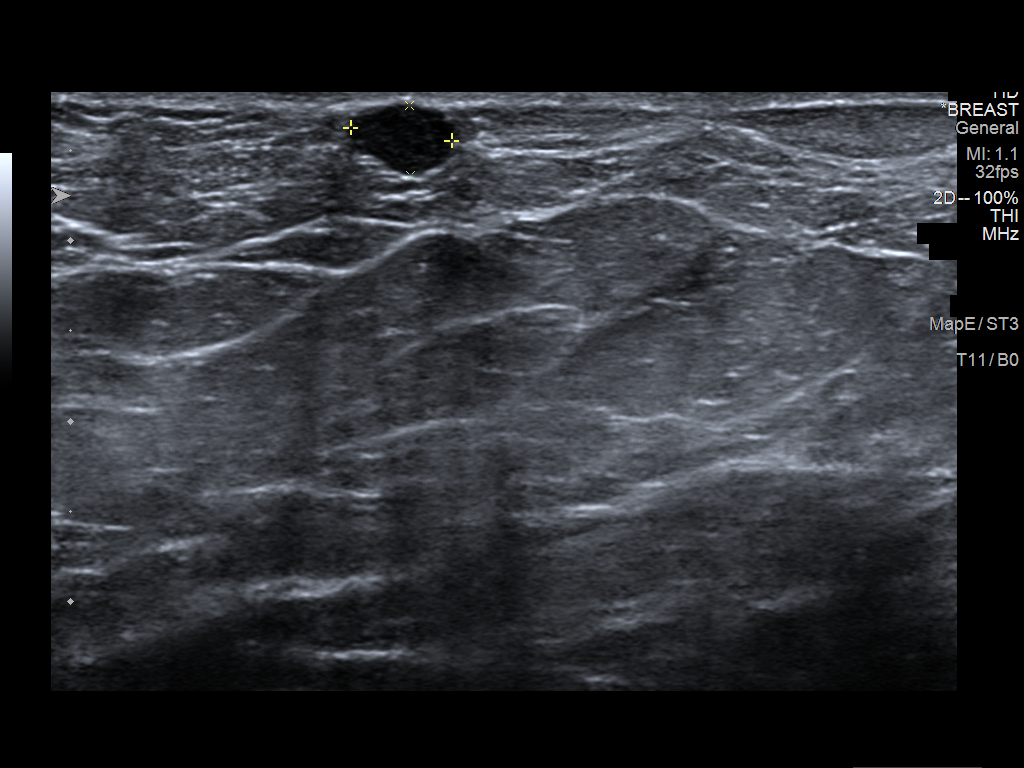
[im 3/5]
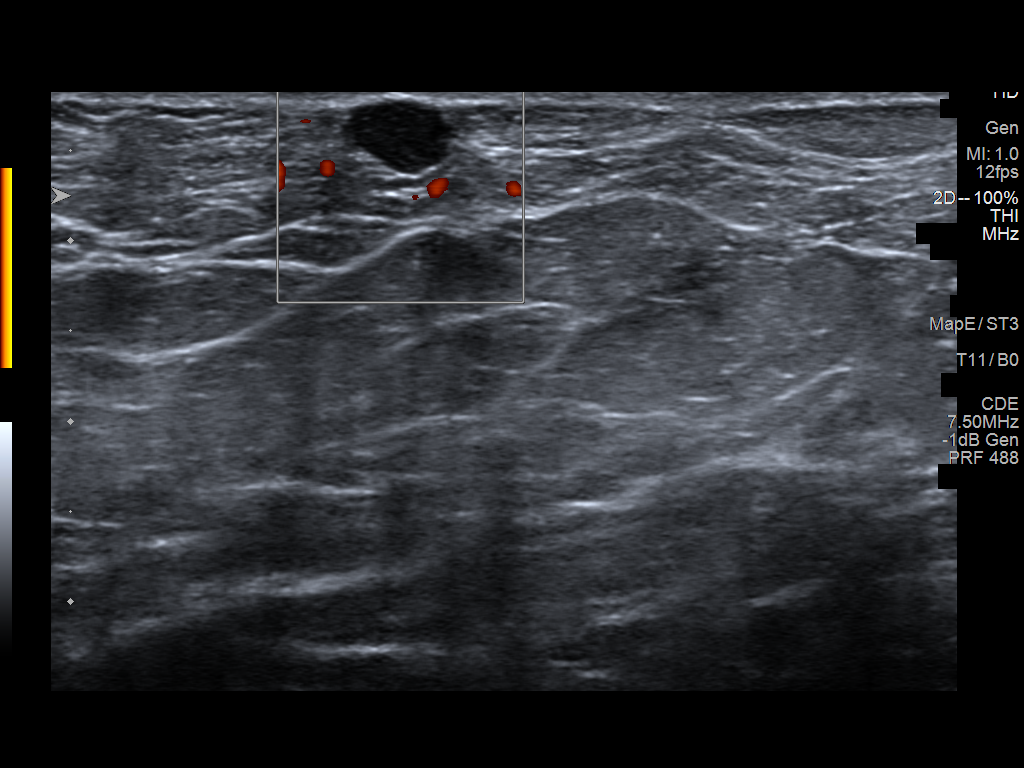
[im 4/5]
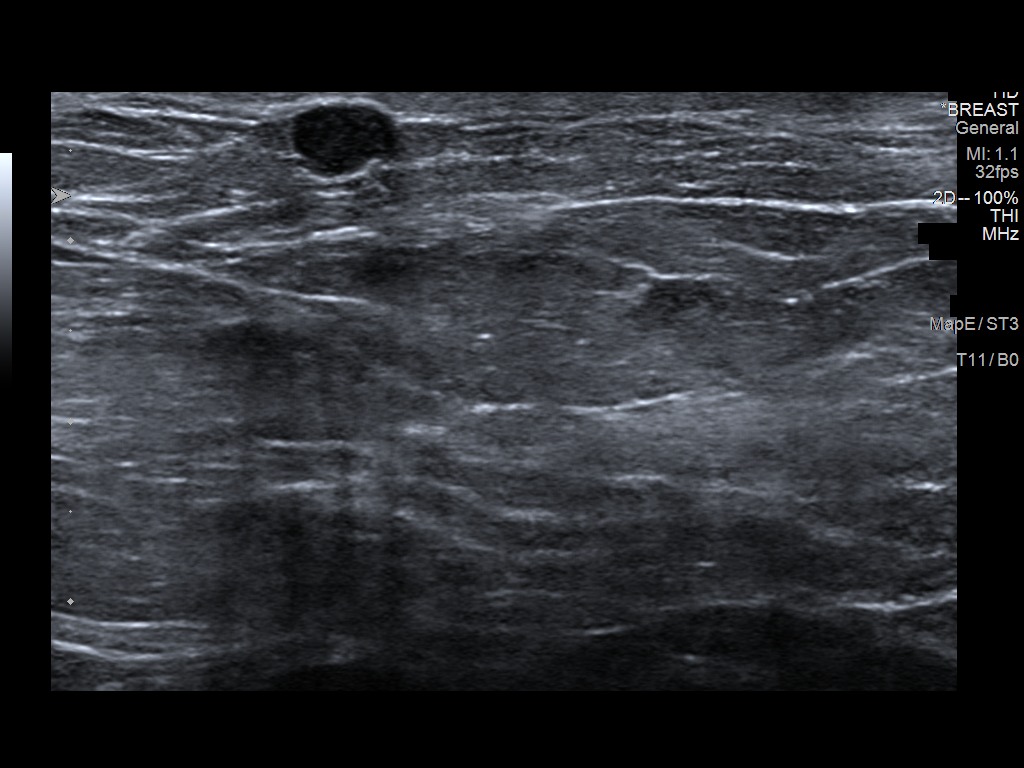
[im 5/5]
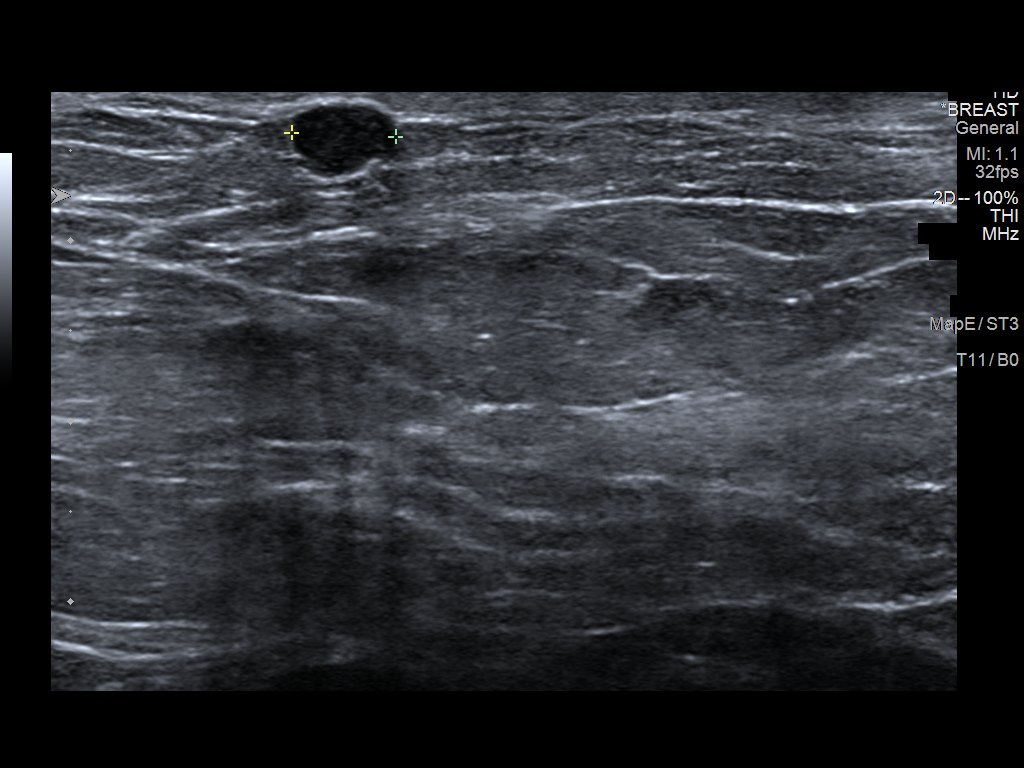

[5 of 5 positions shown; findings below may reference images not displayed]

FINDINGS: The coarse Jumper calcifications in the central to superior
right breast appear stable from the prior exam. In the periareolar
slightly medial left breast there is a circumscribed superficial
mass which has slightly increased in size. No other suspicious
calcifications, masses or areas of distortion are seen in the
bilateral breasts.

Mammographic images were processed with CAD.

Physical exam of the medial periareolar right breast demonstrates a
superficial small palpable mass.

Ultrasound targeted to the left breast at 9 o'clock demonstrates a
superficial nearly anechoic circumscribed oval mass just deep to the
skin. This measures 6 x 6 x 4 mm.
IMPRESSION: 1. The probably benign coarse calcifications in the central superior
right breast are stable.

2. The 6 mm mass in the periareolar left breast is probably benign.
This may represent a benign cyst, a focally dilated duct or possibly
a small benign mass.

RECOMMENDATION:
1. Six-month follow-up ultrasound of the left breast is recommended
to ensure continued stability of the probably benign periareolar
left breast mass.

2. Twelve month follow-up bilateral diagnostic mammogram is
recommended to ensure continued stability of the probably benign
right breast calcifications which will complete the follow-up for
these calcifications.

I have discussed the findings and recommendations with the patient.
Results were also provided in writing at the conclusion of the
visit. If applicable, a reminder letter will be sent to the patient
regarding the next appointment.

BI-RADS CATEGORY  3: Probably benign.

## 2017-11-02 IMAGING — MG MM DIAG BREAST TOMO BILATERAL
8 series · 8 of 12 positions shown · non-contrast
Comparison: Previous exam(s).

ACR Breast Density Category a: The breast tissue is almost entirely
fatty.

CLINICAL DATA: Delayed six-month follow-up for right breast
calcifications. The patient was last seen for her initial screening
recall in Monday March, 2014.

EXAM:
DIGITAL DIAGNOSTIC BILATERAL MAMMOGRAM WITH 3D TOMOSYNTHESIS AND CAD
LEFT BREAST ULTRASOUND

[R ML (1 of 2)]
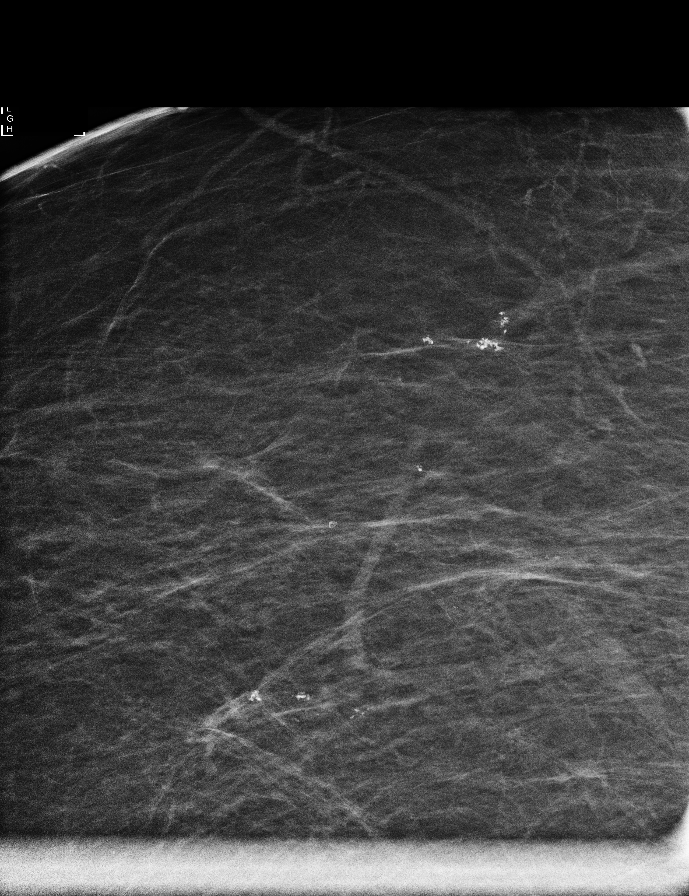

[R ML (2 of 2)]
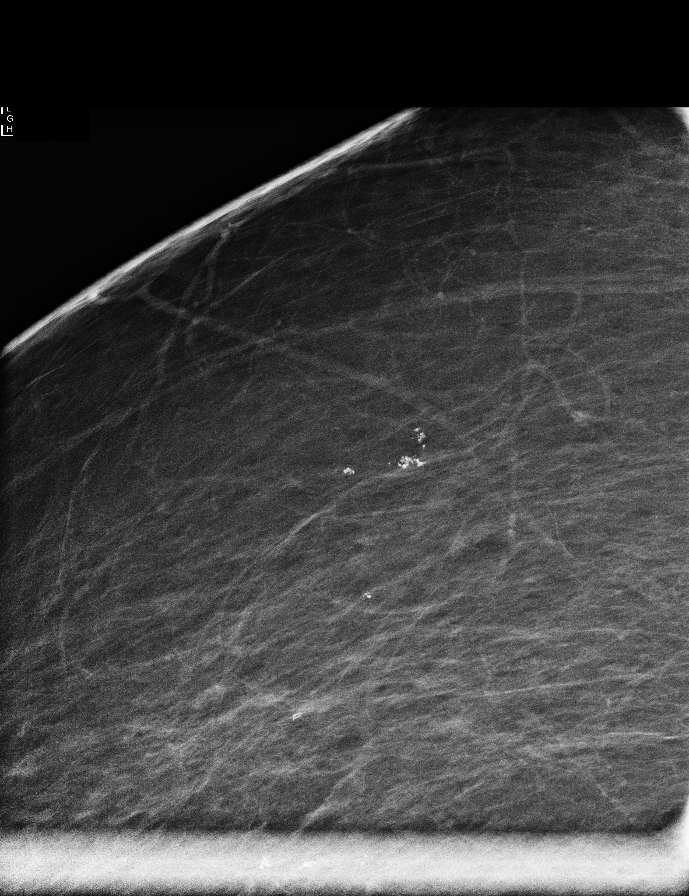

[R CC]
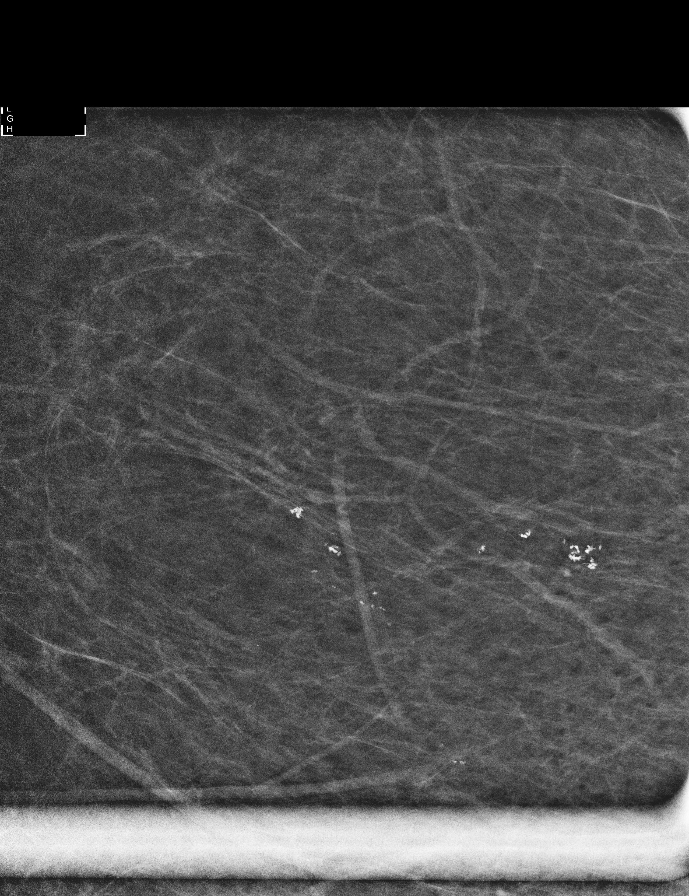

[L CV]
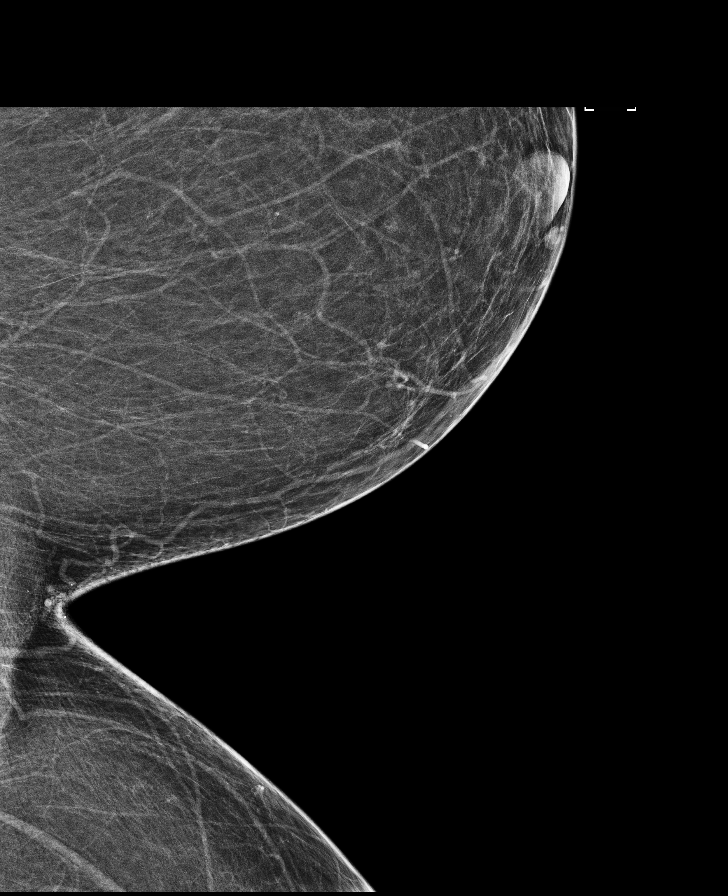

[R MLO]
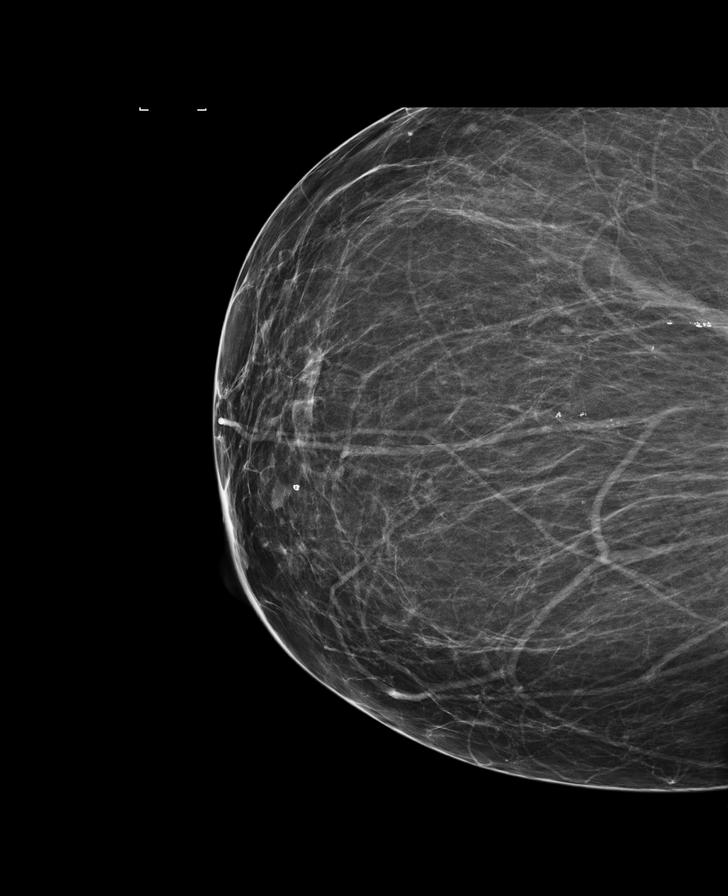

[L MLO (1 of 2)]
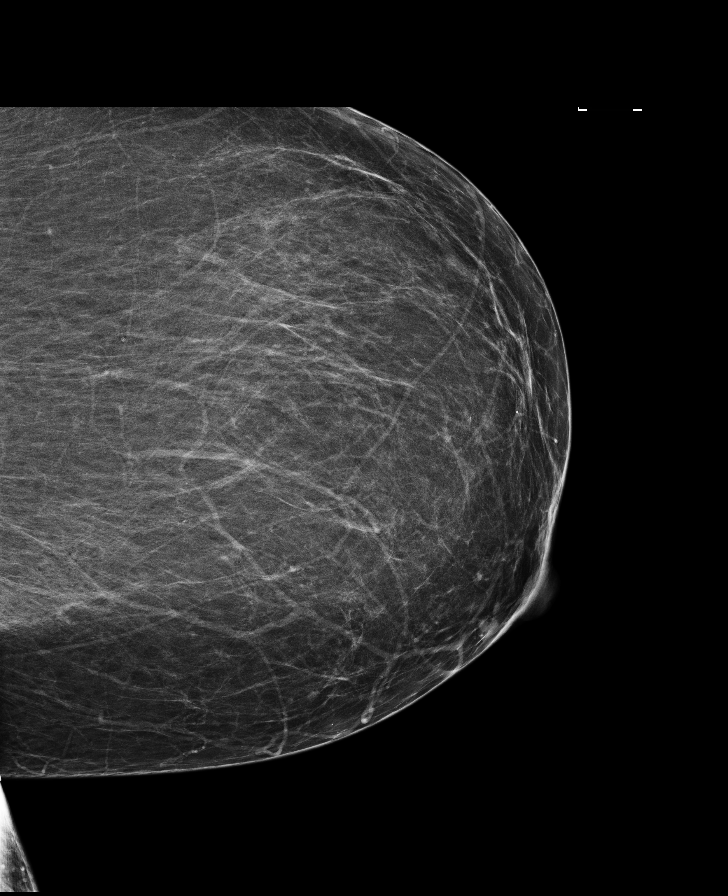

[L MLO (2 of 2)]
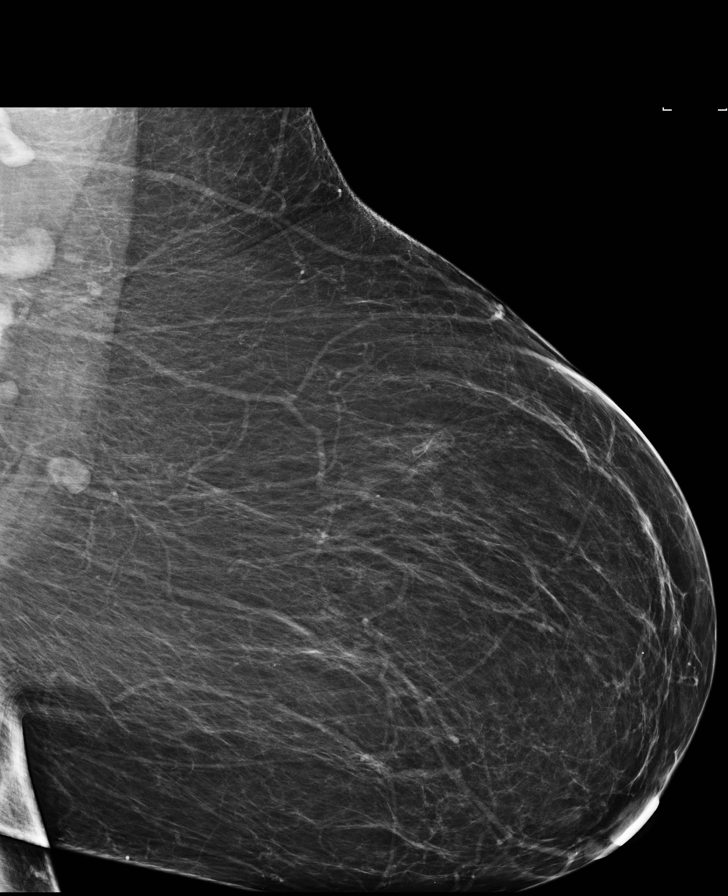

[R CC tomo · tomo slice 33/66.0]
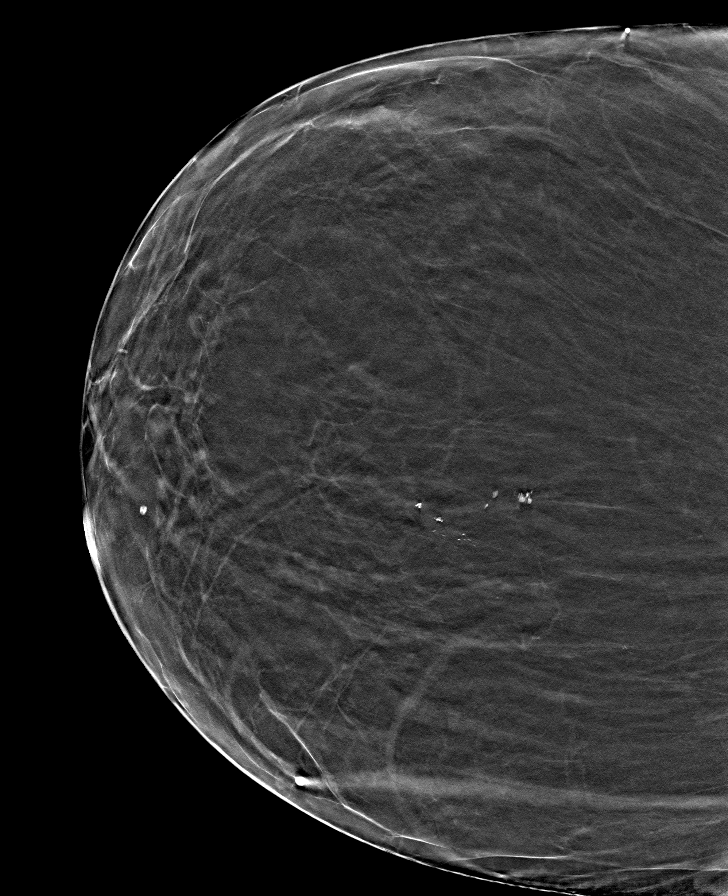

[8 of 12 positions shown; findings below may reference images not displayed]

FINDINGS: The coarse Jumper calcifications in the central to superior
right breast appear stable from the prior exam. In the periareolar
slightly medial left breast there is a circumscribed superficial
mass which has slightly increased in size. No other suspicious
calcifications, masses or areas of distortion are seen in the
bilateral breasts.

Mammographic images were processed with CAD.

Physical exam of the medial periareolar right breast demonstrates a
superficial small palpable mass.

Ultrasound targeted to the left breast at 9 o'clock demonstrates a
superficial nearly anechoic circumscribed oval mass just deep to the
skin. This measures 6 x 6 x 4 mm.
IMPRESSION: 1. The probably benign coarse calcifications in the central superior
right breast are stable.

2. The 6 mm mass in the periareolar left breast is probably benign.
This may represent a benign cyst, a focally dilated duct or possibly
a small benign mass.

RECOMMENDATION:
1. Six-month follow-up ultrasound of the left breast is recommended
to ensure continued stability of the probably benign periareolar
left breast mass.

2. Twelve month follow-up bilateral diagnostic mammogram is
recommended to ensure continued stability of the probably benign
right breast calcifications which will complete the follow-up for
these calcifications.

I have discussed the findings and recommendations with the patient.
Results were also provided in writing at the conclusion of the
visit. If applicable, a reminder letter will be sent to the patient
regarding the next appointment.

BI-RADS CATEGORY  3: Probably benign.

## 2017-11-04 DIAGNOSIS — H04123 Dry eye syndrome of bilateral lacrimal glands: Secondary | ICD-10-CM | POA: Diagnosis not present

## 2017-11-04 DIAGNOSIS — H401131 Primary open-angle glaucoma, bilateral, mild stage: Secondary | ICD-10-CM | POA: Diagnosis not present

## 2017-11-04 DIAGNOSIS — H16042 Marginal corneal ulcer, left eye: Secondary | ICD-10-CM | POA: Diagnosis not present

## 2017-11-05 DIAGNOSIS — H04123 Dry eye syndrome of bilateral lacrimal glands: Secondary | ICD-10-CM | POA: Diagnosis not present

## 2017-11-05 DIAGNOSIS — H16042 Marginal corneal ulcer, left eye: Secondary | ICD-10-CM | POA: Diagnosis not present

## 2017-11-05 DIAGNOSIS — H401131 Primary open-angle glaucoma, bilateral, mild stage: Secondary | ICD-10-CM | POA: Diagnosis not present

## 2017-11-08 DIAGNOSIS — H401131 Primary open-angle glaucoma, bilateral, mild stage: Secondary | ICD-10-CM | POA: Diagnosis not present

## 2017-11-08 DIAGNOSIS — H16042 Marginal corneal ulcer, left eye: Secondary | ICD-10-CM | POA: Diagnosis not present

## 2017-11-08 DIAGNOSIS — H04123 Dry eye syndrome of bilateral lacrimal glands: Secondary | ICD-10-CM | POA: Diagnosis not present

## 2017-11-11 DIAGNOSIS — Z6841 Body Mass Index (BMI) 40.0 and over, adult: Secondary | ICD-10-CM | POA: Diagnosis not present

## 2017-11-11 DIAGNOSIS — E1165 Type 2 diabetes mellitus with hyperglycemia: Secondary | ICD-10-CM | POA: Diagnosis not present

## 2017-11-11 DIAGNOSIS — M792 Neuralgia and neuritis, unspecified: Secondary | ICD-10-CM | POA: Diagnosis not present

## 2017-11-11 DIAGNOSIS — I10 Essential (primary) hypertension: Secondary | ICD-10-CM | POA: Diagnosis not present

## 2017-11-11 DIAGNOSIS — Z794 Long term (current) use of insulin: Secondary | ICD-10-CM | POA: Diagnosis not present

## 2017-11-15 DIAGNOSIS — H401131 Primary open-angle glaucoma, bilateral, mild stage: Secondary | ICD-10-CM | POA: Diagnosis not present

## 2017-11-15 DIAGNOSIS — H16042 Marginal corneal ulcer, left eye: Secondary | ICD-10-CM | POA: Diagnosis not present

## 2017-11-25 ENCOUNTER — Ambulatory Visit: Payer: Medicare Other | Admitting: Adult Health

## 2017-11-27 ENCOUNTER — Ambulatory Visit (INDEPENDENT_AMBULATORY_CARE_PROVIDER_SITE_OTHER): Payer: Medicare Other | Admitting: Orthopedic Surgery

## 2017-11-29 ENCOUNTER — Ambulatory Visit: Payer: Medicare Other | Admitting: Podiatry

## 2017-12-18 ENCOUNTER — Ambulatory Visit (INDEPENDENT_AMBULATORY_CARE_PROVIDER_SITE_OTHER): Payer: Medicare HMO | Admitting: Podiatry

## 2017-12-18 ENCOUNTER — Encounter: Payer: Self-pay | Admitting: Podiatry

## 2017-12-18 DIAGNOSIS — M79676 Pain in unspecified toe(s): Secondary | ICD-10-CM | POA: Diagnosis not present

## 2017-12-18 DIAGNOSIS — Q828 Other specified congenital malformations of skin: Secondary | ICD-10-CM | POA: Diagnosis not present

## 2017-12-18 DIAGNOSIS — B351 Tinea unguium: Secondary | ICD-10-CM | POA: Diagnosis not present

## 2017-12-18 DIAGNOSIS — E119 Type 2 diabetes mellitus without complications: Secondary | ICD-10-CM

## 2017-12-18 NOTE — Progress Notes (Addendum)
Patient ID: Whitney [name], female   DOB: 02-13-1956, 62 y.o.   MRN: [id number] Complaint:  Visit Type: Patient returns to my office for continued preventative foot care services. Complaint: Patient states" my nails have grown long and thick and become painful to walk and wear shoes" Patient has been diagnosed with DM with no foot complications. The patient presents for preventative foot care services. No changes to ROS.  Callus left foot.  Podiatric Exam: Vascular: dorsalis pedis  pulses are palpable bilateral. Her posterior tibial pulses are not palpable due to ankle swelling. Capillary return is immediate. Temperature gradient is WNL. Skin turgor WNL  Sensorium: Normal Semmes Weinstein monofilament test. Normal tactile sensation bilaterally. Nail Exam: Pt has thick disfigured discolored nails with subungual debris noted bilateral entire nail hallux through fifth toenails Ulcer Exam: There is no evidence of ulcer or pre-ulcerative changes or infection. Orthopedic Exam: Muscle tone and strength are WNL. No limitations in general ROM. No crepitus or effusions noted. Foot type and digits show no abnormalities. Bony prominences are unremarkable. Skin: No Porokeratosis. No infection or ulcers.  Porokeratosis sub 5 th left foot. Heloma durum fifth toe right foot.  Diagnosis:  Onychomycosis, , Pain in right toe, pain in left toes,  Debride porokeratosis  Treatment & Plan Procedures and Treatment: Consent by patient was obtained for treatment procedures. The patient understood the discussion of treatment and procedures well. All questions were answered thoroughly reviewed. Debridement of mycotic and hypertrophic toenails, 1 through 5 bilateral and clearing of subungual debris. No ulceration, no infection noted.  Debridement of porokeratosis./corn.  ABN signed for 2019. Return Visit-Office Procedure: Patient instructed to return to the office for a follow up visit 3 months for continued evaluation and  treatment.  Gardiner Barefoot DPM

## 2017-12-21 ENCOUNTER — Other Ambulatory Visit: Payer: Self-pay | Admitting: Adult Health

## 2017-12-21 DIAGNOSIS — E1165 Type 2 diabetes mellitus with hyperglycemia: Secondary | ICD-10-CM

## 2018-01-09 DIAGNOSIS — I5022 Chronic systolic (congestive) heart failure: Secondary | ICD-10-CM | POA: Diagnosis not present

## 2018-01-09 DIAGNOSIS — E1165 Type 2 diabetes mellitus with hyperglycemia: Secondary | ICD-10-CM | POA: Diagnosis not present

## 2018-01-09 DIAGNOSIS — Z794 Long term (current) use of insulin: Secondary | ICD-10-CM | POA: Diagnosis not present

## 2018-01-09 DIAGNOSIS — I10 Essential (primary) hypertension: Secondary | ICD-10-CM | POA: Diagnosis not present

## 2018-02-19 DIAGNOSIS — Z794 Long term (current) use of insulin: Secondary | ICD-10-CM | POA: Diagnosis not present

## 2018-02-19 DIAGNOSIS — E1165 Type 2 diabetes mellitus with hyperglycemia: Secondary | ICD-10-CM | POA: Diagnosis not present

## 2018-02-19 DIAGNOSIS — Z6841 Body Mass Index (BMI) 40.0 and over, adult: Secondary | ICD-10-CM | POA: Diagnosis not present

## 2018-02-19 DIAGNOSIS — I10 Essential (primary) hypertension: Secondary | ICD-10-CM | POA: Diagnosis not present

## 2018-02-19 DIAGNOSIS — N3281 Overactive bladder: Secondary | ICD-10-CM | POA: Diagnosis not present

## 2018-02-19 DIAGNOSIS — I5022 Chronic systolic (congestive) heart failure: Secondary | ICD-10-CM | POA: Diagnosis not present

## 2018-05-01 DIAGNOSIS — I8310 Varicose veins of unspecified lower extremity with inflammation: Secondary | ICD-10-CM | POA: Diagnosis not present

## 2018-05-01 DIAGNOSIS — Z794 Long term (current) use of insulin: Secondary | ICD-10-CM | POA: Diagnosis not present

## 2018-05-01 DIAGNOSIS — E1165 Type 2 diabetes mellitus with hyperglycemia: Secondary | ICD-10-CM | POA: Diagnosis not present

## 2018-05-01 DIAGNOSIS — G629 Polyneuropathy, unspecified: Secondary | ICD-10-CM | POA: Diagnosis not present

## 2018-05-01 DIAGNOSIS — M170 Bilateral primary osteoarthritis of knee: Secondary | ICD-10-CM | POA: Diagnosis not present

## 2018-05-21 DIAGNOSIS — E1165 Type 2 diabetes mellitus with hyperglycemia: Secondary | ICD-10-CM | POA: Diagnosis not present

## 2018-05-21 DIAGNOSIS — I10 Essential (primary) hypertension: Secondary | ICD-10-CM | POA: Diagnosis not present

## 2018-05-21 DIAGNOSIS — I5022 Chronic systolic (congestive) heart failure: Secondary | ICD-10-CM | POA: Diagnosis not present

## 2018-05-21 DIAGNOSIS — Z794 Long term (current) use of insulin: Secondary | ICD-10-CM | POA: Diagnosis not present

## 2018-07-07 DIAGNOSIS — I8310 Varicose veins of unspecified lower extremity with inflammation: Secondary | ICD-10-CM | POA: Diagnosis not present

## 2018-07-07 DIAGNOSIS — I5022 Chronic systolic (congestive) heart failure: Secondary | ICD-10-CM | POA: Diagnosis not present

## 2018-07-07 DIAGNOSIS — E1165 Type 2 diabetes mellitus with hyperglycemia: Secondary | ICD-10-CM | POA: Diagnosis not present

## 2018-07-07 DIAGNOSIS — M25571 Pain in right ankle and joints of right foot: Secondary | ICD-10-CM | POA: Diagnosis not present

## 2018-07-07 DIAGNOSIS — R609 Edema, unspecified: Secondary | ICD-10-CM | POA: Diagnosis not present

## 2018-07-07 DIAGNOSIS — M79671 Pain in right foot: Secondary | ICD-10-CM | POA: Diagnosis not present

## 2018-07-07 DIAGNOSIS — M25572 Pain in left ankle and joints of left foot: Secondary | ICD-10-CM | POA: Diagnosis not present

## 2018-07-07 DIAGNOSIS — L02419 Cutaneous abscess of limb, unspecified: Secondary | ICD-10-CM | POA: Diagnosis not present

## 2018-07-08 ENCOUNTER — Ambulatory Visit
Admission: RE | Admit: 2018-07-08 | Discharge: 2018-07-08 | Disposition: A | Discharge status: Home / Self Care | Payer: Medicare HMO | Source: Ambulatory Visit | Attending: Family Medicine | Admitting: Family Medicine

## 2018-07-08 ENCOUNTER — Other Ambulatory Visit: Payer: Self-pay | Admitting: Family Medicine

## 2018-07-08 DIAGNOSIS — M25472 Effusion, left ankle: Secondary | ICD-10-CM

## 2018-07-08 DIAGNOSIS — M25571 Pain in right ankle and joints of right foot: Secondary | ICD-10-CM

## 2018-07-08 DIAGNOSIS — M7989 Other specified soft tissue disorders: Secondary | ICD-10-CM | POA: Diagnosis not present

## 2018-07-08 DIAGNOSIS — M79671 Pain in right foot: Secondary | ICD-10-CM | POA: Diagnosis not present

## 2018-07-08 DIAGNOSIS — S99911A Unspecified injury of right ankle, initial encounter: Secondary | ICD-10-CM | POA: Diagnosis not present

## 2018-07-08 DIAGNOSIS — M25471 Effusion, right ankle: Secondary | ICD-10-CM

## 2018-07-08 DIAGNOSIS — M25572 Pain in left ankle and joints of left foot: Secondary | ICD-10-CM

## 2018-07-08 DIAGNOSIS — S99912A Unspecified injury of left ankle, initial encounter: Secondary | ICD-10-CM | POA: Diagnosis not present

## 2018-07-08 DIAGNOSIS — S99921A Unspecified injury of right foot, initial encounter: Secondary | ICD-10-CM | POA: Diagnosis not present

## 2018-07-10 DIAGNOSIS — Z23 Encounter for immunization: Secondary | ICD-10-CM | POA: Diagnosis not present

## 2018-07-10 DIAGNOSIS — L0390 Cellulitis, unspecified: Secondary | ICD-10-CM | POA: Diagnosis not present

## 2018-07-10 DIAGNOSIS — E1165 Type 2 diabetes mellitus with hyperglycemia: Secondary | ICD-10-CM | POA: Diagnosis not present

## 2018-07-10 DIAGNOSIS — R609 Edema, unspecified: Secondary | ICD-10-CM | POA: Diagnosis not present

## 2018-07-14 DIAGNOSIS — I872 Venous insufficiency (chronic) (peripheral): Secondary | ICD-10-CM | POA: Diagnosis not present

## 2018-07-14 DIAGNOSIS — Z794 Long term (current) use of insulin: Secondary | ICD-10-CM | POA: Diagnosis not present

## 2018-07-14 DIAGNOSIS — M170 Bilateral primary osteoarthritis of knee: Secondary | ICD-10-CM | POA: Diagnosis not present

## 2018-07-14 DIAGNOSIS — E1140 Type 2 diabetes mellitus with diabetic neuropathy, unspecified: Secondary | ICD-10-CM | POA: Diagnosis not present

## 2018-07-14 DIAGNOSIS — L03115 Cellulitis of right lower limb: Secondary | ICD-10-CM | POA: Diagnosis not present

## 2018-07-14 DIAGNOSIS — L03116 Cellulitis of left lower limb: Secondary | ICD-10-CM | POA: Diagnosis not present

## 2018-07-14 DIAGNOSIS — I5022 Chronic systolic (congestive) heart failure: Secondary | ICD-10-CM | POA: Diagnosis not present

## 2018-07-14 DIAGNOSIS — L02415 Cutaneous abscess of right lower limb: Secondary | ICD-10-CM | POA: Diagnosis not present

## 2018-07-14 DIAGNOSIS — I110 Hypertensive heart disease with heart failure: Secondary | ICD-10-CM | POA: Diagnosis not present

## 2018-07-19 DIAGNOSIS — E1140 Type 2 diabetes mellitus with diabetic neuropathy, unspecified: Secondary | ICD-10-CM | POA: Diagnosis not present

## 2018-07-19 DIAGNOSIS — Z794 Long term (current) use of insulin: Secondary | ICD-10-CM | POA: Diagnosis not present

## 2018-07-19 DIAGNOSIS — L03116 Cellulitis of left lower limb: Secondary | ICD-10-CM | POA: Diagnosis not present

## 2018-07-19 DIAGNOSIS — I872 Venous insufficiency (chronic) (peripheral): Secondary | ICD-10-CM | POA: Diagnosis not present

## 2018-07-19 DIAGNOSIS — L02415 Cutaneous abscess of right lower limb: Secondary | ICD-10-CM | POA: Diagnosis not present

## 2018-07-19 DIAGNOSIS — I110 Hypertensive heart disease with heart failure: Secondary | ICD-10-CM | POA: Diagnosis not present

## 2018-07-19 DIAGNOSIS — L03115 Cellulitis of right lower limb: Secondary | ICD-10-CM | POA: Diagnosis not present

## 2018-07-19 DIAGNOSIS — M170 Bilateral primary osteoarthritis of knee: Secondary | ICD-10-CM | POA: Diagnosis not present

## 2018-07-19 DIAGNOSIS — I5022 Chronic systolic (congestive) heart failure: Secondary | ICD-10-CM | POA: Diagnosis not present

## 2018-07-22 DIAGNOSIS — I5022 Chronic systolic (congestive) heart failure: Secondary | ICD-10-CM | POA: Diagnosis not present

## 2018-07-22 DIAGNOSIS — L02415 Cutaneous abscess of right lower limb: Secondary | ICD-10-CM | POA: Diagnosis not present

## 2018-07-22 DIAGNOSIS — E1140 Type 2 diabetes mellitus with diabetic neuropathy, unspecified: Secondary | ICD-10-CM | POA: Diagnosis not present

## 2018-07-22 DIAGNOSIS — I872 Venous insufficiency (chronic) (peripheral): Secondary | ICD-10-CM | POA: Diagnosis not present

## 2018-07-22 DIAGNOSIS — M170 Bilateral primary osteoarthritis of knee: Secondary | ICD-10-CM | POA: Diagnosis not present

## 2018-07-22 DIAGNOSIS — L03115 Cellulitis of right lower limb: Secondary | ICD-10-CM | POA: Diagnosis not present

## 2018-07-22 DIAGNOSIS — Z794 Long term (current) use of insulin: Secondary | ICD-10-CM | POA: Diagnosis not present

## 2018-07-22 DIAGNOSIS — L03116 Cellulitis of left lower limb: Secondary | ICD-10-CM | POA: Diagnosis not present

## 2018-07-22 DIAGNOSIS — I110 Hypertensive heart disease with heart failure: Secondary | ICD-10-CM | POA: Diagnosis not present

## 2018-07-24 DIAGNOSIS — L03115 Cellulitis of right lower limb: Secondary | ICD-10-CM | POA: Diagnosis not present

## 2018-07-24 DIAGNOSIS — L02415 Cutaneous abscess of right lower limb: Secondary | ICD-10-CM | POA: Diagnosis not present

## 2018-07-24 DIAGNOSIS — Z794 Long term (current) use of insulin: Secondary | ICD-10-CM | POA: Diagnosis not present

## 2018-07-24 DIAGNOSIS — I5022 Chronic systolic (congestive) heart failure: Secondary | ICD-10-CM | POA: Diagnosis not present

## 2018-07-24 DIAGNOSIS — M170 Bilateral primary osteoarthritis of knee: Secondary | ICD-10-CM | POA: Diagnosis not present

## 2018-07-24 DIAGNOSIS — Z6841 Body Mass Index (BMI) 40.0 and over, adult: Secondary | ICD-10-CM | POA: Diagnosis not present

## 2018-07-24 DIAGNOSIS — L03116 Cellulitis of left lower limb: Secondary | ICD-10-CM | POA: Diagnosis not present

## 2018-07-24 DIAGNOSIS — I10 Essential (primary) hypertension: Secondary | ICD-10-CM | POA: Diagnosis not present

## 2018-07-24 DIAGNOSIS — E1140 Type 2 diabetes mellitus with diabetic neuropathy, unspecified: Secondary | ICD-10-CM | POA: Diagnosis not present

## 2018-07-24 DIAGNOSIS — E1165 Type 2 diabetes mellitus with hyperglycemia: Secondary | ICD-10-CM | POA: Diagnosis not present

## 2018-07-24 DIAGNOSIS — I110 Hypertensive heart disease with heart failure: Secondary | ICD-10-CM | POA: Diagnosis not present

## 2018-07-24 DIAGNOSIS — B373 Candidiasis of vulva and vagina: Secondary | ICD-10-CM | POA: Diagnosis not present

## 2018-07-24 DIAGNOSIS — I872 Venous insufficiency (chronic) (peripheral): Secondary | ICD-10-CM | POA: Diagnosis not present

## 2018-07-28 DIAGNOSIS — I5022 Chronic systolic (congestive) heart failure: Secondary | ICD-10-CM | POA: Diagnosis not present

## 2018-07-28 DIAGNOSIS — L03116 Cellulitis of left lower limb: Secondary | ICD-10-CM | POA: Diagnosis not present

## 2018-07-28 DIAGNOSIS — E1140 Type 2 diabetes mellitus with diabetic neuropathy, unspecified: Secondary | ICD-10-CM | POA: Diagnosis not present

## 2018-07-28 DIAGNOSIS — I110 Hypertensive heart disease with heart failure: Secondary | ICD-10-CM | POA: Diagnosis not present

## 2018-07-28 DIAGNOSIS — L02415 Cutaneous abscess of right lower limb: Secondary | ICD-10-CM | POA: Diagnosis not present

## 2018-07-28 DIAGNOSIS — L03115 Cellulitis of right lower limb: Secondary | ICD-10-CM | POA: Diagnosis not present

## 2018-07-28 DIAGNOSIS — Z794 Long term (current) use of insulin: Secondary | ICD-10-CM | POA: Diagnosis not present

## 2018-07-28 DIAGNOSIS — M170 Bilateral primary osteoarthritis of knee: Secondary | ICD-10-CM | POA: Diagnosis not present

## 2018-07-28 DIAGNOSIS — I872 Venous insufficiency (chronic) (peripheral): Secondary | ICD-10-CM | POA: Diagnosis not present

## 2018-07-31 DIAGNOSIS — I110 Hypertensive heart disease with heart failure: Secondary | ICD-10-CM | POA: Diagnosis not present

## 2018-07-31 DIAGNOSIS — I5022 Chronic systolic (congestive) heart failure: Secondary | ICD-10-CM | POA: Diagnosis not present

## 2018-07-31 DIAGNOSIS — L02415 Cutaneous abscess of right lower limb: Secondary | ICD-10-CM | POA: Diagnosis not present

## 2018-07-31 DIAGNOSIS — E1140 Type 2 diabetes mellitus with diabetic neuropathy, unspecified: Secondary | ICD-10-CM | POA: Diagnosis not present

## 2018-07-31 DIAGNOSIS — I872 Venous insufficiency (chronic) (peripheral): Secondary | ICD-10-CM | POA: Diagnosis not present

## 2018-07-31 DIAGNOSIS — L03115 Cellulitis of right lower limb: Secondary | ICD-10-CM | POA: Diagnosis not present

## 2018-07-31 DIAGNOSIS — M170 Bilateral primary osteoarthritis of knee: Secondary | ICD-10-CM | POA: Diagnosis not present

## 2018-07-31 DIAGNOSIS — Z794 Long term (current) use of insulin: Secondary | ICD-10-CM | POA: Diagnosis not present

## 2018-07-31 DIAGNOSIS — L03116 Cellulitis of left lower limb: Secondary | ICD-10-CM | POA: Diagnosis not present

## 2018-08-06 DIAGNOSIS — I110 Hypertensive heart disease with heart failure: Secondary | ICD-10-CM | POA: Diagnosis not present

## 2018-08-06 DIAGNOSIS — I872 Venous insufficiency (chronic) (peripheral): Secondary | ICD-10-CM | POA: Diagnosis not present

## 2018-08-06 DIAGNOSIS — M170 Bilateral primary osteoarthritis of knee: Secondary | ICD-10-CM | POA: Diagnosis not present

## 2018-08-06 DIAGNOSIS — L03116 Cellulitis of left lower limb: Secondary | ICD-10-CM | POA: Diagnosis not present

## 2018-08-06 DIAGNOSIS — Z794 Long term (current) use of insulin: Secondary | ICD-10-CM | POA: Diagnosis not present

## 2018-08-06 DIAGNOSIS — E1140 Type 2 diabetes mellitus with diabetic neuropathy, unspecified: Secondary | ICD-10-CM | POA: Diagnosis not present

## 2018-08-06 DIAGNOSIS — L03115 Cellulitis of right lower limb: Secondary | ICD-10-CM | POA: Diagnosis not present

## 2018-08-06 DIAGNOSIS — I5022 Chronic systolic (congestive) heart failure: Secondary | ICD-10-CM | POA: Diagnosis not present

## 2018-08-06 DIAGNOSIS — L02415 Cutaneous abscess of right lower limb: Secondary | ICD-10-CM | POA: Diagnosis not present

## 2018-08-11 DIAGNOSIS — I5022 Chronic systolic (congestive) heart failure: Secondary | ICD-10-CM | POA: Diagnosis not present

## 2018-08-11 DIAGNOSIS — I110 Hypertensive heart disease with heart failure: Secondary | ICD-10-CM | POA: Diagnosis not present

## 2018-08-11 DIAGNOSIS — L03116 Cellulitis of left lower limb: Secondary | ICD-10-CM | POA: Diagnosis not present

## 2018-08-11 DIAGNOSIS — M170 Bilateral primary osteoarthritis of knee: Secondary | ICD-10-CM | POA: Diagnosis not present

## 2018-08-11 DIAGNOSIS — I872 Venous insufficiency (chronic) (peripheral): Secondary | ICD-10-CM | POA: Diagnosis not present

## 2018-08-11 DIAGNOSIS — L03115 Cellulitis of right lower limb: Secondary | ICD-10-CM | POA: Diagnosis not present

## 2018-08-11 DIAGNOSIS — Z794 Long term (current) use of insulin: Secondary | ICD-10-CM | POA: Diagnosis not present

## 2018-08-11 DIAGNOSIS — L02415 Cutaneous abscess of right lower limb: Secondary | ICD-10-CM | POA: Diagnosis not present

## 2018-08-11 DIAGNOSIS — E1140 Type 2 diabetes mellitus with diabetic neuropathy, unspecified: Secondary | ICD-10-CM | POA: Diagnosis not present

## 2018-08-15 DIAGNOSIS — Z794 Long term (current) use of insulin: Secondary | ICD-10-CM | POA: Diagnosis not present

## 2018-08-15 DIAGNOSIS — E1140 Type 2 diabetes mellitus with diabetic neuropathy, unspecified: Secondary | ICD-10-CM | POA: Diagnosis not present

## 2018-08-15 DIAGNOSIS — L03115 Cellulitis of right lower limb: Secondary | ICD-10-CM | POA: Diagnosis not present

## 2018-08-15 DIAGNOSIS — I110 Hypertensive heart disease with heart failure: Secondary | ICD-10-CM | POA: Diagnosis not present

## 2018-08-15 DIAGNOSIS — I872 Venous insufficiency (chronic) (peripheral): Secondary | ICD-10-CM | POA: Diagnosis not present

## 2018-08-15 DIAGNOSIS — L02415 Cutaneous abscess of right lower limb: Secondary | ICD-10-CM | POA: Diagnosis not present

## 2018-08-15 DIAGNOSIS — I5022 Chronic systolic (congestive) heart failure: Secondary | ICD-10-CM | POA: Diagnosis not present

## 2018-08-15 DIAGNOSIS — L03116 Cellulitis of left lower limb: Secondary | ICD-10-CM | POA: Diagnosis not present

## 2018-08-15 DIAGNOSIS — M170 Bilateral primary osteoarthritis of knee: Secondary | ICD-10-CM | POA: Diagnosis not present

## 2018-08-18 DIAGNOSIS — I110 Hypertensive heart disease with heart failure: Secondary | ICD-10-CM | POA: Diagnosis not present

## 2018-08-18 DIAGNOSIS — L02415 Cutaneous abscess of right lower limb: Secondary | ICD-10-CM | POA: Diagnosis not present

## 2018-08-18 DIAGNOSIS — I872 Venous insufficiency (chronic) (peripheral): Secondary | ICD-10-CM | POA: Diagnosis not present

## 2018-08-18 DIAGNOSIS — L03116 Cellulitis of left lower limb: Secondary | ICD-10-CM | POA: Diagnosis not present

## 2018-08-18 DIAGNOSIS — L03115 Cellulitis of right lower limb: Secondary | ICD-10-CM | POA: Diagnosis not present

## 2018-08-18 DIAGNOSIS — I5022 Chronic systolic (congestive) heart failure: Secondary | ICD-10-CM | POA: Diagnosis not present

## 2018-08-18 DIAGNOSIS — Z794 Long term (current) use of insulin: Secondary | ICD-10-CM | POA: Diagnosis not present

## 2018-08-18 DIAGNOSIS — M170 Bilateral primary osteoarthritis of knee: Secondary | ICD-10-CM | POA: Diagnosis not present

## 2018-08-18 DIAGNOSIS — E1140 Type 2 diabetes mellitus with diabetic neuropathy, unspecified: Secondary | ICD-10-CM | POA: Diagnosis not present

## 2018-08-19 ENCOUNTER — Encounter: Payer: Self-pay | Admitting: Podiatry

## 2018-08-19 ENCOUNTER — Ambulatory Visit: Payer: Medicare HMO | Admitting: Podiatry

## 2018-08-19 DIAGNOSIS — M79676 Pain in unspecified toe(s): Secondary | ICD-10-CM

## 2018-08-19 DIAGNOSIS — B351 Tinea unguium: Secondary | ICD-10-CM | POA: Diagnosis not present

## 2018-08-19 DIAGNOSIS — L84 Corns and callosities: Secondary | ICD-10-CM | POA: Diagnosis not present

## 2018-08-19 DIAGNOSIS — E119 Type 2 diabetes mellitus without complications: Secondary | ICD-10-CM

## 2018-08-19 NOTE — Progress Notes (Signed)
Patient ID: Whitney [name], female   DOB: [birth year], 62 y.o.   MRN: [id number] Complaint:  Visit Type: Patient returns to my office for continued preventative foot care services. Complaint: Patient states" my nails have grown long and thick and become painful to walk and wear shoes" Patient has been diagnosed with DM with no foot complications. The patient presents for preventative foot care services. No changes to ROS.  Corn fifth toe right foot.  Patient is wearing unna boots both legs.  Podiatric Exam: Vascular: Deferred Sensorium: Deferred Nail Exam: Pt has thick disfigured discolored nails with subungual debris noted bilateral entire nail hallux through fifth toenails Ulcer Exam: There is no evidence of ulcer or pre-ulcerative changes or infection. Orthopedic Exam: Muscle tone and strength are WNL. No limitations in general ROM. No crepitus or effusions noted. Foot type and digits show no abnormalities. Bony prominences are unremarkable. Skin: No Porokeratosis. No infection or ulcers. Heloma durum fifth toe right foot.  Diagnosis:  Onychomycosis, , Pain in right toe, pain in left toes,  Debride corn.  Treatment & Plan Procedures and Treatment: Consent by patient was obtained for treatment procedures. The patient understood the discussion of treatment and procedures well. All questions were answered thoroughly reviewed. Debridement of mycotic and hypertrophic toenails, 1 through 5 bilateral and clearing of subungual debris. No ulceration, no infection noted.  Debridement of /corn.  ABN signed for 2019. Return Visit-Office Procedure: Patient instructed to return to the office for a follow up visit 3 months for continued evaluation and treatment.  Gardiner Barefoot DPM

## 2018-08-21 DIAGNOSIS — L03115 Cellulitis of right lower limb: Secondary | ICD-10-CM | POA: Diagnosis not present

## 2018-08-21 DIAGNOSIS — I872 Venous insufficiency (chronic) (peripheral): Secondary | ICD-10-CM | POA: Diagnosis not present

## 2018-08-21 DIAGNOSIS — L02415 Cutaneous abscess of right lower limb: Secondary | ICD-10-CM | POA: Diagnosis not present

## 2018-08-21 DIAGNOSIS — E1140 Type 2 diabetes mellitus with diabetic neuropathy, unspecified: Secondary | ICD-10-CM | POA: Diagnosis not present

## 2018-08-21 DIAGNOSIS — I110 Hypertensive heart disease with heart failure: Secondary | ICD-10-CM | POA: Diagnosis not present

## 2018-08-21 DIAGNOSIS — I5022 Chronic systolic (congestive) heart failure: Secondary | ICD-10-CM | POA: Diagnosis not present

## 2018-08-21 DIAGNOSIS — L03116 Cellulitis of left lower limb: Secondary | ICD-10-CM | POA: Diagnosis not present

## 2018-08-21 DIAGNOSIS — M170 Bilateral primary osteoarthritis of knee: Secondary | ICD-10-CM | POA: Diagnosis not present

## 2018-08-21 DIAGNOSIS — Z794 Long term (current) use of insulin: Secondary | ICD-10-CM | POA: Diagnosis not present

## 2018-08-25 DIAGNOSIS — E1140 Type 2 diabetes mellitus with diabetic neuropathy, unspecified: Secondary | ICD-10-CM | POA: Diagnosis not present

## 2018-08-25 DIAGNOSIS — L03116 Cellulitis of left lower limb: Secondary | ICD-10-CM | POA: Diagnosis not present

## 2018-08-25 DIAGNOSIS — Z794 Long term (current) use of insulin: Secondary | ICD-10-CM | POA: Diagnosis not present

## 2018-08-25 DIAGNOSIS — I110 Hypertensive heart disease with heart failure: Secondary | ICD-10-CM | POA: Diagnosis not present

## 2018-08-25 DIAGNOSIS — L03115 Cellulitis of right lower limb: Secondary | ICD-10-CM | POA: Diagnosis not present

## 2018-08-25 DIAGNOSIS — L02415 Cutaneous abscess of right lower limb: Secondary | ICD-10-CM | POA: Diagnosis not present

## 2018-08-25 DIAGNOSIS — I5022 Chronic systolic (congestive) heart failure: Secondary | ICD-10-CM | POA: Diagnosis not present

## 2018-08-25 DIAGNOSIS — M170 Bilateral primary osteoarthritis of knee: Secondary | ICD-10-CM | POA: Diagnosis not present

## 2018-08-25 DIAGNOSIS — I872 Venous insufficiency (chronic) (peripheral): Secondary | ICD-10-CM | POA: Diagnosis not present

## 2018-08-29 DIAGNOSIS — L03116 Cellulitis of left lower limb: Secondary | ICD-10-CM | POA: Diagnosis not present

## 2018-08-29 DIAGNOSIS — L02415 Cutaneous abscess of right lower limb: Secondary | ICD-10-CM | POA: Diagnosis not present

## 2018-08-29 DIAGNOSIS — E1140 Type 2 diabetes mellitus with diabetic neuropathy, unspecified: Secondary | ICD-10-CM | POA: Diagnosis not present

## 2018-08-29 DIAGNOSIS — Z794 Long term (current) use of insulin: Secondary | ICD-10-CM | POA: Diagnosis not present

## 2018-08-29 DIAGNOSIS — I872 Venous insufficiency (chronic) (peripheral): Secondary | ICD-10-CM | POA: Diagnosis not present

## 2018-08-29 DIAGNOSIS — I5022 Chronic systolic (congestive) heart failure: Secondary | ICD-10-CM | POA: Diagnosis not present

## 2018-08-29 DIAGNOSIS — I110 Hypertensive heart disease with heart failure: Secondary | ICD-10-CM | POA: Diagnosis not present

## 2018-08-29 DIAGNOSIS — M170 Bilateral primary osteoarthritis of knee: Secondary | ICD-10-CM | POA: Diagnosis not present

## 2018-08-29 DIAGNOSIS — L03115 Cellulitis of right lower limb: Secondary | ICD-10-CM | POA: Diagnosis not present

## 2018-09-02 DIAGNOSIS — I872 Venous insufficiency (chronic) (peripheral): Secondary | ICD-10-CM | POA: Diagnosis not present

## 2018-09-02 DIAGNOSIS — L03116 Cellulitis of left lower limb: Secondary | ICD-10-CM | POA: Diagnosis not present

## 2018-09-02 DIAGNOSIS — L03115 Cellulitis of right lower limb: Secondary | ICD-10-CM | POA: Diagnosis not present

## 2018-09-02 DIAGNOSIS — I110 Hypertensive heart disease with heart failure: Secondary | ICD-10-CM | POA: Diagnosis not present

## 2018-09-02 DIAGNOSIS — L02415 Cutaneous abscess of right lower limb: Secondary | ICD-10-CM | POA: Diagnosis not present

## 2018-09-02 DIAGNOSIS — Z794 Long term (current) use of insulin: Secondary | ICD-10-CM | POA: Diagnosis not present

## 2018-09-02 DIAGNOSIS — M170 Bilateral primary osteoarthritis of knee: Secondary | ICD-10-CM | POA: Diagnosis not present

## 2018-09-02 DIAGNOSIS — E1140 Type 2 diabetes mellitus with diabetic neuropathy, unspecified: Secondary | ICD-10-CM | POA: Diagnosis not present

## 2018-09-02 DIAGNOSIS — I5022 Chronic systolic (congestive) heart failure: Secondary | ICD-10-CM | POA: Diagnosis not present

## 2018-09-04 ENCOUNTER — Other Ambulatory Visit: Payer: Self-pay | Admitting: Adult Health

## 2018-09-04 DIAGNOSIS — I5022 Chronic systolic (congestive) heart failure: Secondary | ICD-10-CM

## 2018-09-04 DIAGNOSIS — I10 Essential (primary) hypertension: Secondary | ICD-10-CM

## 2018-09-09 DIAGNOSIS — Z794 Long term (current) use of insulin: Secondary | ICD-10-CM | POA: Diagnosis not present

## 2018-09-09 DIAGNOSIS — I110 Hypertensive heart disease with heart failure: Secondary | ICD-10-CM | POA: Diagnosis not present

## 2018-09-09 DIAGNOSIS — L02415 Cutaneous abscess of right lower limb: Secondary | ICD-10-CM | POA: Diagnosis not present

## 2018-09-09 DIAGNOSIS — I872 Venous insufficiency (chronic) (peripheral): Secondary | ICD-10-CM | POA: Diagnosis not present

## 2018-09-09 DIAGNOSIS — L03115 Cellulitis of right lower limb: Secondary | ICD-10-CM | POA: Diagnosis not present

## 2018-09-09 DIAGNOSIS — I5022 Chronic systolic (congestive) heart failure: Secondary | ICD-10-CM | POA: Diagnosis not present

## 2018-09-09 DIAGNOSIS — E1140 Type 2 diabetes mellitus with diabetic neuropathy, unspecified: Secondary | ICD-10-CM | POA: Diagnosis not present

## 2018-09-09 DIAGNOSIS — M170 Bilateral primary osteoarthritis of knee: Secondary | ICD-10-CM | POA: Diagnosis not present

## 2018-09-09 DIAGNOSIS — L03116 Cellulitis of left lower limb: Secondary | ICD-10-CM | POA: Diagnosis not present

## 2018-09-10 DIAGNOSIS — I8310 Varicose veins of unspecified lower extremity with inflammation: Secondary | ICD-10-CM | POA: Diagnosis not present

## 2018-09-10 DIAGNOSIS — Z794 Long term (current) use of insulin: Secondary | ICD-10-CM | POA: Diagnosis not present

## 2018-09-10 DIAGNOSIS — I10 Essential (primary) hypertension: Secondary | ICD-10-CM | POA: Diagnosis not present

## 2018-09-10 DIAGNOSIS — G4733 Obstructive sleep apnea (adult) (pediatric): Secondary | ICD-10-CM | POA: Diagnosis not present

## 2018-09-10 DIAGNOSIS — M170 Bilateral primary osteoarthritis of knee: Secondary | ICD-10-CM | POA: Diagnosis not present

## 2018-09-10 DIAGNOSIS — E7800 Pure hypercholesterolemia, unspecified: Secondary | ICD-10-CM | POA: Diagnosis not present

## 2018-09-10 DIAGNOSIS — E1165 Type 2 diabetes mellitus with hyperglycemia: Secondary | ICD-10-CM | POA: Diagnosis not present

## 2018-09-18 DIAGNOSIS — I8310 Varicose veins of unspecified lower extremity with inflammation: Secondary | ICD-10-CM | POA: Diagnosis not present

## 2018-09-18 DIAGNOSIS — R609 Edema, unspecified: Secondary | ICD-10-CM | POA: Diagnosis not present

## 2018-09-18 DIAGNOSIS — L089 Local infection of the skin and subcutaneous tissue, unspecified: Secondary | ICD-10-CM | POA: Diagnosis not present

## 2018-09-22 DIAGNOSIS — L03116 Cellulitis of left lower limb: Secondary | ICD-10-CM | POA: Diagnosis not present

## 2018-09-22 DIAGNOSIS — L03115 Cellulitis of right lower limb: Secondary | ICD-10-CM | POA: Diagnosis not present

## 2018-09-22 DIAGNOSIS — I8310 Varicose veins of unspecified lower extremity with inflammation: Secondary | ICD-10-CM | POA: Diagnosis not present

## 2018-09-29 DIAGNOSIS — Z794 Long term (current) use of insulin: Secondary | ICD-10-CM | POA: Diagnosis not present

## 2018-09-29 DIAGNOSIS — I8312 Varicose veins of left lower extremity with inflammation: Secondary | ICD-10-CM | POA: Diagnosis not present

## 2018-09-29 DIAGNOSIS — I5032 Chronic diastolic (congestive) heart failure: Secondary | ICD-10-CM | POA: Diagnosis not present

## 2018-09-29 DIAGNOSIS — I110 Hypertensive heart disease with heart failure: Secondary | ICD-10-CM | POA: Diagnosis not present

## 2018-09-29 DIAGNOSIS — I8311 Varicose veins of right lower extremity with inflammation: Secondary | ICD-10-CM | POA: Diagnosis not present

## 2018-09-29 DIAGNOSIS — L03116 Cellulitis of left lower limb: Secondary | ICD-10-CM | POA: Diagnosis not present

## 2018-09-29 DIAGNOSIS — E119 Type 2 diabetes mellitus without complications: Secondary | ICD-10-CM | POA: Diagnosis not present

## 2018-09-29 DIAGNOSIS — L03115 Cellulitis of right lower limb: Secondary | ICD-10-CM | POA: Diagnosis not present

## 2018-10-06 DIAGNOSIS — L03115 Cellulitis of right lower limb: Secondary | ICD-10-CM | POA: Diagnosis not present

## 2018-10-06 DIAGNOSIS — E119 Type 2 diabetes mellitus without complications: Secondary | ICD-10-CM | POA: Diagnosis not present

## 2018-10-06 DIAGNOSIS — I110 Hypertensive heart disease with heart failure: Secondary | ICD-10-CM | POA: Diagnosis not present

## 2018-10-06 DIAGNOSIS — Z794 Long term (current) use of insulin: Secondary | ICD-10-CM | POA: Diagnosis not present

## 2018-10-06 DIAGNOSIS — L03116 Cellulitis of left lower limb: Secondary | ICD-10-CM | POA: Diagnosis not present

## 2018-10-06 DIAGNOSIS — I8312 Varicose veins of left lower extremity with inflammation: Secondary | ICD-10-CM | POA: Diagnosis not present

## 2018-10-06 DIAGNOSIS — I8311 Varicose veins of right lower extremity with inflammation: Secondary | ICD-10-CM | POA: Diagnosis not present

## 2018-10-06 DIAGNOSIS — I5032 Chronic diastolic (congestive) heart failure: Secondary | ICD-10-CM | POA: Diagnosis not present

## 2018-10-09 DIAGNOSIS — L03115 Cellulitis of right lower limb: Secondary | ICD-10-CM | POA: Diagnosis not present

## 2018-10-09 DIAGNOSIS — I8311 Varicose veins of right lower extremity with inflammation: Secondary | ICD-10-CM | POA: Diagnosis not present

## 2018-10-09 DIAGNOSIS — I5032 Chronic diastolic (congestive) heart failure: Secondary | ICD-10-CM | POA: Diagnosis not present

## 2018-10-09 DIAGNOSIS — I110 Hypertensive heart disease with heart failure: Secondary | ICD-10-CM | POA: Diagnosis not present

## 2018-10-09 DIAGNOSIS — Z794 Long term (current) use of insulin: Secondary | ICD-10-CM | POA: Diagnosis not present

## 2018-10-09 DIAGNOSIS — I8312 Varicose veins of left lower extremity with inflammation: Secondary | ICD-10-CM | POA: Diagnosis not present

## 2018-10-09 DIAGNOSIS — L03116 Cellulitis of left lower limb: Secondary | ICD-10-CM | POA: Diagnosis not present

## 2018-10-09 DIAGNOSIS — E119 Type 2 diabetes mellitus without complications: Secondary | ICD-10-CM | POA: Diagnosis not present

## 2018-10-13 DIAGNOSIS — Z794 Long term (current) use of insulin: Secondary | ICD-10-CM | POA: Diagnosis not present

## 2018-10-13 DIAGNOSIS — I8311 Varicose veins of right lower extremity with inflammation: Secondary | ICD-10-CM | POA: Diagnosis not present

## 2018-10-13 DIAGNOSIS — L03116 Cellulitis of left lower limb: Secondary | ICD-10-CM | POA: Diagnosis not present

## 2018-10-13 DIAGNOSIS — L03115 Cellulitis of right lower limb: Secondary | ICD-10-CM | POA: Diagnosis not present

## 2018-10-13 DIAGNOSIS — E119 Type 2 diabetes mellitus without complications: Secondary | ICD-10-CM | POA: Diagnosis not present

## 2018-10-13 DIAGNOSIS — I5032 Chronic diastolic (congestive) heart failure: Secondary | ICD-10-CM | POA: Diagnosis not present

## 2018-10-13 DIAGNOSIS — I8312 Varicose veins of left lower extremity with inflammation: Secondary | ICD-10-CM | POA: Diagnosis not present

## 2018-10-13 DIAGNOSIS — I110 Hypertensive heart disease with heart failure: Secondary | ICD-10-CM | POA: Diagnosis not present

## 2018-10-16 ENCOUNTER — Ambulatory Visit: Payer: Medicare HMO | Admitting: Pulmonary Disease

## 2018-10-16 DIAGNOSIS — L03115 Cellulitis of right lower limb: Secondary | ICD-10-CM | POA: Diagnosis not present

## 2018-10-16 DIAGNOSIS — I110 Hypertensive heart disease with heart failure: Secondary | ICD-10-CM | POA: Diagnosis not present

## 2018-10-16 DIAGNOSIS — I8312 Varicose veins of left lower extremity with inflammation: Secondary | ICD-10-CM | POA: Diagnosis not present

## 2018-10-16 DIAGNOSIS — Z794 Long term (current) use of insulin: Secondary | ICD-10-CM | POA: Diagnosis not present

## 2018-10-16 DIAGNOSIS — L03116 Cellulitis of left lower limb: Secondary | ICD-10-CM | POA: Diagnosis not present

## 2018-10-16 DIAGNOSIS — I8311 Varicose veins of right lower extremity with inflammation: Secondary | ICD-10-CM | POA: Diagnosis not present

## 2018-10-16 DIAGNOSIS — I5032 Chronic diastolic (congestive) heart failure: Secondary | ICD-10-CM | POA: Diagnosis not present

## 2018-10-16 DIAGNOSIS — E119 Type 2 diabetes mellitus without complications: Secondary | ICD-10-CM | POA: Diagnosis not present

## 2018-10-16 DIAGNOSIS — Z6841 Body Mass Index (BMI) 40.0 and over, adult: Secondary | ICD-10-CM | POA: Diagnosis not present

## 2018-10-20 DIAGNOSIS — I8311 Varicose veins of right lower extremity with inflammation: Secondary | ICD-10-CM | POA: Diagnosis not present

## 2018-10-20 DIAGNOSIS — I110 Hypertensive heart disease with heart failure: Secondary | ICD-10-CM | POA: Diagnosis not present

## 2018-10-20 DIAGNOSIS — E119 Type 2 diabetes mellitus without complications: Secondary | ICD-10-CM | POA: Diagnosis not present

## 2018-10-20 DIAGNOSIS — I8312 Varicose veins of left lower extremity with inflammation: Secondary | ICD-10-CM | POA: Diagnosis not present

## 2018-10-20 DIAGNOSIS — L03116 Cellulitis of left lower limb: Secondary | ICD-10-CM | POA: Diagnosis not present

## 2018-10-20 DIAGNOSIS — L03115 Cellulitis of right lower limb: Secondary | ICD-10-CM | POA: Diagnosis not present

## 2018-10-20 DIAGNOSIS — Z794 Long term (current) use of insulin: Secondary | ICD-10-CM | POA: Diagnosis not present

## 2018-10-20 DIAGNOSIS — I5032 Chronic diastolic (congestive) heart failure: Secondary | ICD-10-CM | POA: Diagnosis not present

## 2018-10-28 DIAGNOSIS — E119 Type 2 diabetes mellitus without complications: Secondary | ICD-10-CM | POA: Diagnosis not present

## 2018-10-28 DIAGNOSIS — I5032 Chronic diastolic (congestive) heart failure: Secondary | ICD-10-CM | POA: Diagnosis not present

## 2018-10-28 DIAGNOSIS — I110 Hypertensive heart disease with heart failure: Secondary | ICD-10-CM | POA: Diagnosis not present

## 2018-10-28 DIAGNOSIS — I8311 Varicose veins of right lower extremity with inflammation: Secondary | ICD-10-CM | POA: Diagnosis not present

## 2018-10-28 DIAGNOSIS — L03116 Cellulitis of left lower limb: Secondary | ICD-10-CM | POA: Diagnosis not present

## 2018-10-28 DIAGNOSIS — L03115 Cellulitis of right lower limb: Secondary | ICD-10-CM | POA: Diagnosis not present

## 2018-10-28 DIAGNOSIS — I8312 Varicose veins of left lower extremity with inflammation: Secondary | ICD-10-CM | POA: Diagnosis not present

## 2018-10-28 DIAGNOSIS — Z794 Long term (current) use of insulin: Secondary | ICD-10-CM | POA: Diagnosis not present

## 2018-11-11 ENCOUNTER — Ambulatory Visit: Payer: Medicare HMO | Admitting: Pulmonary Disease

## 2018-11-12 DIAGNOSIS — L03115 Cellulitis of right lower limb: Secondary | ICD-10-CM | POA: Diagnosis not present

## 2018-11-12 DIAGNOSIS — L03116 Cellulitis of left lower limb: Secondary | ICD-10-CM | POA: Diagnosis not present

## 2018-11-12 DIAGNOSIS — I5032 Chronic diastolic (congestive) heart failure: Secondary | ICD-10-CM | POA: Diagnosis not present

## 2018-11-12 DIAGNOSIS — I8312 Varicose veins of left lower extremity with inflammation: Secondary | ICD-10-CM | POA: Diagnosis not present

## 2018-11-12 DIAGNOSIS — Z794 Long term (current) use of insulin: Secondary | ICD-10-CM | POA: Diagnosis not present

## 2018-11-12 DIAGNOSIS — E119 Type 2 diabetes mellitus without complications: Secondary | ICD-10-CM | POA: Diagnosis not present

## 2018-11-12 DIAGNOSIS — I110 Hypertensive heart disease with heart failure: Secondary | ICD-10-CM | POA: Diagnosis not present

## 2018-11-12 DIAGNOSIS — I8311 Varicose veins of right lower extremity with inflammation: Secondary | ICD-10-CM | POA: Diagnosis not present

## 2018-11-18 ENCOUNTER — Ambulatory Visit: Payer: Medicare HMO | Admitting: Podiatry

## 2018-12-23 DIAGNOSIS — Z794 Long term (current) use of insulin: Secondary | ICD-10-CM | POA: Diagnosis not present

## 2018-12-23 DIAGNOSIS — I5022 Chronic systolic (congestive) heart failure: Secondary | ICD-10-CM | POA: Diagnosis not present

## 2018-12-23 DIAGNOSIS — E1165 Type 2 diabetes mellitus with hyperglycemia: Secondary | ICD-10-CM | POA: Diagnosis not present

## 2019-01-08 DIAGNOSIS — Z79899 Other long term (current) drug therapy: Secondary | ICD-10-CM | POA: Diagnosis not present

## 2019-01-20 DIAGNOSIS — I10 Essential (primary) hypertension: Secondary | ICD-10-CM | POA: Diagnosis not present

## 2019-01-20 DIAGNOSIS — Z794 Long term (current) use of insulin: Secondary | ICD-10-CM | POA: Diagnosis not present

## 2019-01-20 DIAGNOSIS — I5022 Chronic systolic (congestive) heart failure: Secondary | ICD-10-CM | POA: Diagnosis not present

## 2019-01-20 DIAGNOSIS — E7800 Pure hypercholesterolemia, unspecified: Secondary | ICD-10-CM | POA: Diagnosis not present

## 2019-01-20 DIAGNOSIS — E1165 Type 2 diabetes mellitus with hyperglycemia: Secondary | ICD-10-CM | POA: Diagnosis not present

## 2019-03-18 ENCOUNTER — Encounter: Payer: Self-pay | Admitting: Podiatry

## 2019-03-18 ENCOUNTER — Ambulatory Visit: Payer: Medicare HMO | Admitting: Podiatry

## 2019-03-18 ENCOUNTER — Other Ambulatory Visit: Payer: Self-pay

## 2019-03-18 DIAGNOSIS — B351 Tinea unguium: Secondary | ICD-10-CM | POA: Insufficient documentation

## 2019-03-18 DIAGNOSIS — M79675 Pain in left toe(s): Secondary | ICD-10-CM

## 2019-03-18 DIAGNOSIS — Q828 Other specified congenital malformations of skin: Secondary | ICD-10-CM | POA: Diagnosis not present

## 2019-03-18 DIAGNOSIS — E1165 Type 2 diabetes mellitus with hyperglycemia: Secondary | ICD-10-CM | POA: Diagnosis not present

## 2019-03-18 DIAGNOSIS — M79674 Pain in right toe(s): Secondary | ICD-10-CM

## 2019-03-18 NOTE — Progress Notes (Addendum)
Patient ID: Whitney [name], female   DOB: 04/30/1956, 63 y.o.   MRN: [id number] Complaint:  Visit Type: Patient returns to my office for continued preventative foot care services. Complaint: Patient states" my nails have grown long and thick and become painful to walk and wear shoes" Patient has been diagnosed with DM with no foot complications. The patient presents for preventative foot care services. No changes to ROS.  Corn fifth toe right foot.  Painful callus left foot. Patient has not been seen for 8 months.  Podiatric Exam: Vascular: Deferred Sensorium: Deferred Nail Exam: Pt has thick disfigured discolored nails with subungual debris noted bilateral entire nail hallux through fifth toenails Ulcer Exam: There is no evidence of ulcer or pre-ulcerative changes or infection. Orthopedic Exam: Muscle tone and strength are WNL. No limitations in general ROM. No crepitus or effusions noted. Foot type and digits show no abnormalities. Plantar flex 5th  Left foot. Skin: No Porokeratosis. No infection or ulcers. Heloma durum fifth toe right foot. Porokeratosis sub 5 left foot.  Diagnosis:  Onychomycosis, , Pain in right toe, pain in left toes,  Porokeratosis left foot.  Treatment & Plan Procedures and Treatment: Consent by patient was obtained for treatment procedures. The patient understood the discussion of treatment and procedures well. All questions were answered thoroughly reviewed. Debridement of mycotic and hypertrophic toenails, 1 through 5 bilateral and clearing of subungual debris. No ulceration, no infection noted.  Debridement of  porokeratosis Return Visit-Office Procedure: Patient instructed to return to the office for a follow up visit 3 months for continued evaluation and treatment.  Gardiner Barefoot DPM

## 2019-03-24 DIAGNOSIS — G629 Polyneuropathy, unspecified: Secondary | ICD-10-CM | POA: Diagnosis not present

## 2019-03-24 DIAGNOSIS — I8310 Varicose veins of unspecified lower extremity with inflammation: Secondary | ICD-10-CM | POA: Diagnosis not present

## 2019-03-24 DIAGNOSIS — I10 Essential (primary) hypertension: Secondary | ICD-10-CM | POA: Diagnosis not present

## 2019-03-24 DIAGNOSIS — I5022 Chronic systolic (congestive) heart failure: Secondary | ICD-10-CM | POA: Diagnosis not present

## 2019-03-24 DIAGNOSIS — E1165 Type 2 diabetes mellitus with hyperglycemia: Secondary | ICD-10-CM | POA: Diagnosis not present

## 2019-03-24 DIAGNOSIS — Z794 Long term (current) use of insulin: Secondary | ICD-10-CM | POA: Diagnosis not present

## 2019-03-24 DIAGNOSIS — E7800 Pure hypercholesterolemia, unspecified: Secondary | ICD-10-CM | POA: Diagnosis not present

## 2019-03-24 DIAGNOSIS — M7989 Other specified soft tissue disorders: Secondary | ICD-10-CM | POA: Diagnosis not present

## 2019-04-01 DIAGNOSIS — E1165 Type 2 diabetes mellitus with hyperglycemia: Secondary | ICD-10-CM | POA: Diagnosis not present

## 2019-04-01 DIAGNOSIS — D1721 Benign lipomatous neoplasm of skin and subcutaneous tissue of right arm: Secondary | ICD-10-CM | POA: Diagnosis not present

## 2019-04-09 DIAGNOSIS — I8310 Varicose veins of unspecified lower extremity with inflammation: Secondary | ICD-10-CM | POA: Diagnosis not present

## 2019-04-16 DIAGNOSIS — R600 Localized edema: Secondary | ICD-10-CM | POA: Diagnosis not present

## 2019-04-17 DIAGNOSIS — L089 Local infection of the skin and subcutaneous tissue, unspecified: Secondary | ICD-10-CM | POA: Diagnosis not present

## 2019-04-17 DIAGNOSIS — I878 Other specified disorders of veins: Secondary | ICD-10-CM | POA: Diagnosis not present

## 2019-04-24 DIAGNOSIS — Z794 Long term (current) use of insulin: Secondary | ICD-10-CM | POA: Diagnosis not present

## 2019-04-24 DIAGNOSIS — R600 Localized edema: Secondary | ICD-10-CM | POA: Diagnosis not present

## 2019-04-24 DIAGNOSIS — I872 Venous insufficiency (chronic) (peripheral): Secondary | ICD-10-CM | POA: Diagnosis not present

## 2019-04-24 DIAGNOSIS — E1165 Type 2 diabetes mellitus with hyperglycemia: Secondary | ICD-10-CM | POA: Diagnosis not present

## 2019-04-29 DIAGNOSIS — I872 Venous insufficiency (chronic) (peripheral): Secondary | ICD-10-CM | POA: Diagnosis not present

## 2019-05-01 ENCOUNTER — Other Ambulatory Visit: Payer: Self-pay

## 2019-05-01 NOTE — Patient Outreach (Signed)
[place] [facility]) Care Management  05/01/2019  JAMISEN HAWES Dec 31, 1955 [id number]  TELEPHONE SCREENING Referral date: 04/30/19 Referral source: primary MD office.  Referral reason: needs help with nutrition for weight loss, Diabetes  Insurance: [employer]  Attempt #1  Telephone call to patient regarding primary MD referral. Unable to reach patient. HIPAA compliant voice message left with call back phone number.   PLAN: RNCm will attempt 2nd telephone call to patient within 4 business days.  RNCM will send outreach letter to patient to attempt contact.   Quinn Plowman RN,BSN,CCM [facility] Telephonic  [phone]

## 2019-05-05 ENCOUNTER — Other Ambulatory Visit: Payer: Self-pay

## 2019-05-05 ENCOUNTER — Ambulatory Visit: Payer: Self-pay

## 2019-05-05 NOTE — Patient Outreach (Signed)
[place] [facility]) Care Management  05/05/2019  DESSIREE SZE 1955-10-03 [id number]  TELEPHONE SCREENING Referral date: 04/30/19 Referral source: primary MD office.  Referral reason: needs help with nutrition for weight loss, Diabetes  Insurance: [employer]  Attempt #2  Telephone call to patient regarding primary MD referral. Unable to reach patient. HIPAA compliant voice message left with call back phone number.   PLAN; RNCM will attempt 3rd telephone call to patient within 4 business days.   Quinn Plowman RN,BSN,CCM [facility] Telephonic  [phone]

## 2019-05-11 ENCOUNTER — Other Ambulatory Visit: Payer: Self-pay

## 2019-05-11 ENCOUNTER — Encounter (HOSPITAL_BASED_OUTPATIENT_CLINIC_OR_DEPARTMENT_OTHER): Payer: HMO | Attending: Internal Medicine

## 2019-05-11 DIAGNOSIS — L97812 Non-pressure chronic ulcer of other part of right lower leg with fat layer exposed: Secondary | ICD-10-CM | POA: Insufficient documentation

## 2019-05-11 DIAGNOSIS — G4733 Obstructive sleep apnea (adult) (pediatric): Secondary | ICD-10-CM | POA: Diagnosis not present

## 2019-05-11 DIAGNOSIS — L97212 Non-pressure chronic ulcer of right calf with fat layer exposed: Secondary | ICD-10-CM | POA: Diagnosis not present

## 2019-05-11 DIAGNOSIS — I87313 Chronic venous hypertension (idiopathic) with ulcer of bilateral lower extremity: Secondary | ICD-10-CM | POA: Insufficient documentation

## 2019-05-11 DIAGNOSIS — I87333 Chronic venous hypertension (idiopathic) with ulcer and inflammation of bilateral lower extremity: Secondary | ICD-10-CM | POA: Diagnosis not present

## 2019-05-11 DIAGNOSIS — L97222 Non-pressure chronic ulcer of left calf with fat layer exposed: Secondary | ICD-10-CM | POA: Diagnosis not present

## 2019-05-11 DIAGNOSIS — I872 Venous insufficiency (chronic) (peripheral): Secondary | ICD-10-CM | POA: Diagnosis not present

## 2019-05-11 DIAGNOSIS — E119 Type 2 diabetes mellitus without complications: Secondary | ICD-10-CM | POA: Diagnosis not present

## 2019-05-11 DIAGNOSIS — I110 Hypertensive heart disease with heart failure: Secondary | ICD-10-CM | POA: Diagnosis not present

## 2019-05-11 DIAGNOSIS — I509 Heart failure, unspecified: Secondary | ICD-10-CM | POA: Diagnosis not present

## 2019-05-11 DIAGNOSIS — Z794 Long term (current) use of insulin: Secondary | ICD-10-CM | POA: Insufficient documentation

## 2019-05-11 NOTE — Patient Outreach (Signed)
[place] [facility]) Care Management  05/11/2019  CARRISA KELLER [birth year] [id number] TELEPHONE SCREENING Referral date:04/30/19 Referral source:primary MD office. Referral reason:needs help with nutrition for weight loss, Diabetes Insurance:[employer]  Attempt #3  Telephone call to patient regarding primary MD office referral. Unable to reach patient. HIPAA compliant voice message left with call back phone number.   PLAN; If no return call will proceed with closure.   Quinn Plowman RN,BSN,CCM [facility] Telephonic  [phone]

## 2019-05-15 ENCOUNTER — Other Ambulatory Visit: Payer: Self-pay

## 2019-05-15 NOTE — Patient Outreach (Signed)
[place] [facility]) Care Management  05/15/2019  Whitney [name] 03-21-1956 [id number]  TELEPHONE SCREENING Referral date:04/30/19 Referral source:primary MD office. Referral reason:needs help with nutrition for weight loss, Diabetes Insurance:[employer]  No response from patient after telephone outreaches and letter attempt  PLAN: RNCM will close case due to being unable to reach [name] will send closure letter to patient  Quinn Plowman RN,BSN,CCM [facility] Telephonic  [phone]

## 2019-05-18 DIAGNOSIS — L97819 Non-pressure chronic ulcer of other part of right lower leg with unspecified severity: Secondary | ICD-10-CM | POA: Diagnosis not present

## 2019-05-18 DIAGNOSIS — I890 Lymphedema, not elsewhere classified: Secondary | ICD-10-CM | POA: Diagnosis not present

## 2019-05-18 DIAGNOSIS — I87313 Chronic venous hypertension (idiopathic) with ulcer of bilateral lower extremity: Secondary | ICD-10-CM | POA: Diagnosis not present

## 2019-05-18 DIAGNOSIS — L97229 Non-pressure chronic ulcer of left calf with unspecified severity: Secondary | ICD-10-CM | POA: Diagnosis not present

## 2019-05-22 NOTE — Telephone Encounter (Signed)
This encounter was created in error - please disregard.

## 2019-05-25 DIAGNOSIS — S81801A Unspecified open wound, right lower leg, initial encounter: Secondary | ICD-10-CM | POA: Diagnosis not present

## 2019-05-25 DIAGNOSIS — I890 Lymphedema, not elsewhere classified: Secondary | ICD-10-CM | POA: Diagnosis not present

## 2019-05-25 DIAGNOSIS — S81802A Unspecified open wound, left lower leg, initial encounter: Secondary | ICD-10-CM | POA: Diagnosis not present

## 2019-05-25 DIAGNOSIS — I87313 Chronic venous hypertension (idiopathic) with ulcer of bilateral lower extremity: Secondary | ICD-10-CM | POA: Diagnosis not present

## 2019-06-23 ENCOUNTER — Ambulatory Visit: Payer: HMO | Admitting: Podiatry

## 2019-06-25 ENCOUNTER — Observation Stay (HOSPITAL_COMMUNITY)
Admission: EM | Admit: 2019-06-25 | Discharge: 2019-06-27 | Disposition: A | Discharge status: Home / Self Care | Payer: HMO | Attending: Internal Medicine | Admitting: Internal Medicine

## 2019-06-25 ENCOUNTER — Emergency Department (HOSPITAL_COMMUNITY): Payer: HMO

## 2019-06-25 ENCOUNTER — Other Ambulatory Visit: Payer: Self-pay

## 2019-06-25 ENCOUNTER — Encounter (HOSPITAL_COMMUNITY): Payer: Self-pay | Admitting: Emergency Medicine

## 2019-06-25 DIAGNOSIS — Z9181 History of falling: Secondary | ICD-10-CM | POA: Insufficient documentation

## 2019-06-25 DIAGNOSIS — Z7982 Long term (current) use of aspirin: Secondary | ICD-10-CM | POA: Diagnosis not present

## 2019-06-25 DIAGNOSIS — I200 Unstable angina: Secondary | ICD-10-CM | POA: Diagnosis not present

## 2019-06-25 DIAGNOSIS — Z79899 Other long term (current) drug therapy: Secondary | ICD-10-CM | POA: Diagnosis not present

## 2019-06-25 DIAGNOSIS — R0601 Orthopnea: Secondary | ICD-10-CM | POA: Diagnosis not present

## 2019-06-25 DIAGNOSIS — M6281 Muscle weakness (generalized): Secondary | ICD-10-CM | POA: Diagnosis not present

## 2019-06-25 DIAGNOSIS — Z8679 Personal history of other diseases of the circulatory system: Secondary | ICD-10-CM

## 2019-06-25 DIAGNOSIS — E1165 Type 2 diabetes mellitus with hyperglycemia: Secondary | ICD-10-CM | POA: Diagnosis not present

## 2019-06-25 DIAGNOSIS — M1990 Unspecified osteoarthritis, unspecified site: Secondary | ICD-10-CM | POA: Diagnosis not present

## 2019-06-25 DIAGNOSIS — Z6841 Body Mass Index (BMI) 40.0 and over, adult: Secondary | ICD-10-CM | POA: Diagnosis not present

## 2019-06-25 DIAGNOSIS — G4733 Obstructive sleep apnea (adult) (pediatric): Secondary | ICD-10-CM | POA: Diagnosis not present

## 2019-06-25 DIAGNOSIS — R079 Chest pain, unspecified: Secondary | ICD-10-CM | POA: Diagnosis not present

## 2019-06-25 DIAGNOSIS — Z23 Encounter for immunization: Secondary | ICD-10-CM | POA: Diagnosis not present

## 2019-06-25 DIAGNOSIS — Z20828 Contact with and (suspected) exposure to other viral communicable diseases: Secondary | ICD-10-CM | POA: Diagnosis not present

## 2019-06-25 DIAGNOSIS — R2689 Other abnormalities of gait and mobility: Secondary | ICD-10-CM | POA: Insufficient documentation

## 2019-06-25 DIAGNOSIS — K219 Gastro-esophageal reflux disease without esophagitis: Secondary | ICD-10-CM | POA: Insufficient documentation

## 2019-06-25 DIAGNOSIS — Z8674 Personal history of sudden cardiac arrest: Secondary | ICD-10-CM | POA: Diagnosis not present

## 2019-06-25 DIAGNOSIS — H409 Unspecified glaucoma: Secondary | ICD-10-CM | POA: Diagnosis not present

## 2019-06-25 DIAGNOSIS — I10 Essential (primary) hypertension: Secondary | ICD-10-CM | POA: Diagnosis not present

## 2019-06-25 DIAGNOSIS — I872 Venous insufficiency (chronic) (peripheral): Secondary | ICD-10-CM | POA: Diagnosis not present

## 2019-06-25 DIAGNOSIS — E785 Hyperlipidemia, unspecified: Secondary | ICD-10-CM | POA: Diagnosis not present

## 2019-06-25 DIAGNOSIS — R0789 Other chest pain: Secondary | ICD-10-CM | POA: Diagnosis not present

## 2019-06-25 DIAGNOSIS — IMO0002 Reserved for concepts with insufficient information to code with codable children: Secondary | ICD-10-CM | POA: Diagnosis present

## 2019-06-25 DIAGNOSIS — I201 Angina pectoris with documented spasm: Secondary | ICD-10-CM | POA: Diagnosis not present

## 2019-06-25 DIAGNOSIS — Z794 Long term (current) use of insulin: Secondary | ICD-10-CM | POA: Insufficient documentation

## 2019-06-25 DIAGNOSIS — E876 Hypokalemia: Secondary | ICD-10-CM | POA: Diagnosis not present

## 2019-06-25 DIAGNOSIS — R0602 Shortness of breath: Secondary | ICD-10-CM | POA: Diagnosis not present

## 2019-06-25 DIAGNOSIS — I2510 Atherosclerotic heart disease of native coronary artery without angina pectoris: Secondary | ICD-10-CM | POA: Insufficient documentation

## 2019-06-25 LAB — BASIC METABOLIC PANEL
Anion gap: 10 (ref 5–15)
BUN: 10 mg/dL (ref 8–23)
CO2: 25 mmol/L (ref 22–32)
Calcium: 8.7 mg/dL — ABNORMAL LOW (ref 8.9–10.3)
Chloride: 106 mmol/L (ref 98–111)
Creatinine, Ser: 0.64 mg/dL (ref 0.44–1.00)
GFR calc Af Amer: >60 mL/min (ref >60)
GFR calc non Af Amer: >60 mL/min (ref >60)
Glucose, Bld: 141 mg/dL — ABNORMAL HIGH (ref 70–99)
Potassium: 3.2 mmol/L — ABNORMAL LOW (ref 3.5–5.1)
Sodium: 141 mmol/L (ref 135–145)

## 2019-06-25 LAB — CBC WITH DIFFERENTIAL/PLATELET
Abs Immature Granulocytes: 0.05 10*3/uL (ref 0.00–0.07)
Basophils Absolute: 0 10*3/uL (ref 0.0–0.1)
Basophils Relative: 0 %
Eosinophils Absolute: 0.1 10*3/uL (ref 0.0–0.5)
Eosinophils Relative: 1 %
HCT: 36.6 % (ref 36.0–46.0)
Hemoglobin: 10.8 g/dL — ABNORMAL LOW (ref 12.0–15.0)
Immature Granulocytes: 1 %
Lymphocytes Relative: 23 %
Lymphs Abs: 2 10*3/uL (ref 0.7–4.0)
MCH: 25.5 pg — ABNORMAL LOW (ref 26.0–34.0)
MCHC: 29.5 g/dL — ABNORMAL LOW (ref 30.0–36.0)
MCV: 86.5 fL (ref 80.0–100.0)
Monocytes Absolute: 0.5 10*3/uL (ref 0.1–1.0)
Monocytes Relative: 6 %
Neutro Abs: 6 10*3/uL (ref 1.7–7.7)
Neutrophils Relative %: 69 %
Platelets: 254 10*3/uL (ref 150–400)
RBC: 4.23 MIL/uL (ref 3.87–5.11)
RDW: 15 % (ref 11.5–15.5)
WBC: 8.7 10*3/uL (ref 4.0–10.5)
nRBC: 0 % (ref 0.0–0.2)

## 2019-06-25 LAB — TROPONIN I (HIGH SENSITIVITY)
Troponin I (High Sensitivity): 12 ng/L (ref <18)
Troponin I (High Sensitivity): 16 ng/L (ref <18)
Troponin I (High Sensitivity): 17 ng/L (ref <18)

## 2019-06-25 LAB — CBG MONITORING, ED: Glucose-Capillary: 99 mg/dL (ref 70–99)

## 2019-06-25 LAB — BRAIN NATRIURETIC PEPTIDE: B Natriuretic Peptide: 65.6 pg/mL (ref 0.0–100.0)

## 2019-06-25 MED ORDER — PRAVASTATIN SODIUM 40 MG PO TABS
40.0000 mg | ORAL_TABLET | Freq: Every day | ORAL | Status: DC
  Administered 2019-06-25 – 2019-06-26 (×2): 40 mg via ORAL
  Filled 2019-06-25 (×2): qty 1

## 2019-06-25 MED ORDER — SODIUM CHLORIDE 0.9% FLUSH: 3.0000 mL | INTRAVENOUS | Status: DC | PRN

## 2019-06-25 MED ORDER — GABAPENTIN 300 MG PO CAPS
300.0000 mg | ORAL_CAPSULE | Freq: Two times a day (BID) | ORAL | Status: DC
  Administered 2019-06-25 – 2019-06-27 (×4): 300 mg via ORAL
  Filled 2019-06-25 (×4): qty 1

## 2019-06-25 MED ORDER — ONDANSETRON HCL 4 MG/2ML IJ SOLN: 4.0000 mg | Freq: Four times a day (QID) | INTRAMUSCULAR | Status: DC | PRN

## 2019-06-25 MED ORDER — SODIUM CHLORIDE 0.9% FLUSH
3.0000 mL | Freq: Two times a day (BID) | INTRAVENOUS | Status: DC
  Administered 2019-06-26 – 2019-06-27 (×3): 3 mL via INTRAVENOUS

## 2019-06-25 MED ORDER — INSULIN ASPART 100 UNIT/ML ~~LOC~~ SOLN
0.0000 [IU] | Freq: Three times a day (TID) | SUBCUTANEOUS | Status: DC
  Administered 2019-06-26: 2 [IU] via SUBCUTANEOUS
  Administered 2019-06-26: 1 [IU] via SUBCUTANEOUS
  Administered 2019-06-26 – 2019-06-27 (×2): 2 [IU] via SUBCUTANEOUS

## 2019-06-25 MED ORDER — ASPIRIN EC 81 MG PO TBEC: 81.0000 mg | DELAYED_RELEASE_TABLET | Freq: Every day | ORAL | Status: DC

## 2019-06-25 MED ORDER — POTASSIUM CHLORIDE CRYS ER 20 MEQ PO TBCR
40.0000 meq | EXTENDED_RELEASE_TABLET | Freq: Once | ORAL | Status: AC
  Administered 2019-06-25: 40 meq via ORAL
  Filled 2019-06-25: qty 2

## 2019-06-25 MED ORDER — INSULIN ASPART 100 UNIT/ML ~~LOC~~ SOLN
0.0000 [IU] | Freq: Every day | SUBCUTANEOUS | Status: DC
  Administered 2019-06-26: 2 [IU] via SUBCUTANEOUS

## 2019-06-25 MED ORDER — NITROGLYCERIN 0.4 MG SL SUBL: 0.4000 mg | SUBLINGUAL_TABLET | SUBLINGUAL | Status: DC | PRN

## 2019-06-25 MED ORDER — FUROSEMIDE 20 MG PO TABS
20.0000 mg | ORAL_TABLET | Freq: Every day | ORAL | Status: DC
  Administered 2019-06-26 – 2019-06-27 (×2): 20 mg via ORAL
  Filled 2019-06-25 (×2): qty 1

## 2019-06-25 MED ORDER — ACETAMINOPHEN 325 MG PO TABS
650.0000 mg | ORAL_TABLET | ORAL | Status: DC | PRN
  Administered 2019-06-25: 650 mg via ORAL
  Filled 2019-06-25: qty 2

## 2019-06-25 MED ORDER — HYDRALAZINE HCL 20 MG/ML IJ SOLN: 10.0000 mg | INTRAMUSCULAR | Status: DC | PRN

## 2019-06-25 MED ORDER — ASPIRIN EC 325 MG PO TBEC
325.0000 mg | DELAYED_RELEASE_TABLET | Freq: Every day | ORAL | Status: DC
  Administered 2019-06-26: 325 mg via ORAL
  Filled 2019-06-25: qty 1

## 2019-06-25 MED ORDER — METOPROLOL SUCCINATE ER 100 MG PO TB24
100.0000 mg | ORAL_TABLET | Freq: Every day | ORAL | Status: DC
  Administered 2019-06-26 – 2019-06-27 (×2): 100 mg via ORAL
  Filled 2019-06-25 (×2): qty 1

## 2019-06-25 MED ORDER — ENOXAPARIN SODIUM 40 MG/0.4ML ~~LOC~~ SOLN
40.0000 mg | SUBCUTANEOUS | Status: DC
  Administered 2019-06-25 – 2019-06-26 (×2): 40 mg via SUBCUTANEOUS
  Filled 2019-06-25 (×2): qty 0.4

## 2019-06-25 MED ORDER — INSULIN GLARGINE 100 UNIT/ML ~~LOC~~ SOLN
10.0000 [IU] | Freq: Every day | SUBCUTANEOUS | Status: DC
  Filled 2019-06-25 (×2): qty 0.1

## 2019-06-25 MED ORDER — METOPROLOL TARTRATE 25 MG PO TABS
25.0000 mg | ORAL_TABLET | Freq: Once | ORAL | Status: AC
  Administered 2019-06-25: 25 mg via ORAL
  Filled 2019-06-25: qty 1

## 2019-06-25 MED ORDER — SODIUM CHLORIDE 0.9 % IV SOLN: 250.0000 mL | INTRAVENOUS | Status: DC | PRN

## 2019-06-25 MED ORDER — CYCLOBENZAPRINE HCL 10 MG PO TABS
10.0000 mg | ORAL_TABLET | Freq: Two times a day (BID) | ORAL | Status: DC
  Administered 2019-06-25 – 2019-06-27 (×4): 10 mg via ORAL
  Filled 2019-06-25 (×5): qty 1

## 2019-06-25 MED ORDER — LISINOPRIL 20 MG PO TABS
20.0000 mg | ORAL_TABLET | Freq: Every day | ORAL | Status: DC
  Administered 2019-06-26: 20 mg via ORAL
  Filled 2019-06-25: qty 1

## 2019-06-25 NOTE — ED Provider Notes (Signed)
[place] EMERGENCY DEPARTMENT Provider Note   CSN: [id number] Arrival date & time: 06/25/19  1657     History   Chief Complaint Chief Complaint  Patient presents with  . Chest Pain  . Shortness of Breath    HPI Whitney [name] is a 63 y.o. female.  Presents emergency department after referral from PCP office today.  Patient states over the past few days she has noted intermittent episodes of chest tightness/palpitations and shortness of breath.  States these episodes are worse with exertion, relieved with rest.  No other aggravating factors.  Episodes are normally brief.  Currently denies any ongoing symptoms.  Patient reports per the doctor's office she was told she had a 20 pound weight gain over the past month.  States she has been taking her oral Lasix daily as prescribed, has not had any recent changes in this medication.     HPI  Past Medical History:  Diagnosis Date  . Arthritis    left knee,right hip  . Cardiac arrest ([place])   . Depression   . Diabetes mellitus   . GERD (gastroesophageal reflux disease)   . Glaucoma   . H/O cardiac arrest 11/2011  . Heart murmur   . Hyperlipidemia   . Hypertension   . Myocardial infarction ([place]) 2013  . Sleep apnea    Bring machine,mask and tubing    Patient Active Problem List   Diagnosis Date Noted  . Pain due to onychomycosis of toenails of both feet 03/18/2019  . Porokeratosis 03/18/2019  . Acute right lumbar radiculopathy 07/25/2017  . At risk for adverse drug event 07/16/2017  . Humerus fracture 07/12/2017  . Morbid obesity due to excess calories ([place]) 12/14/2015  . Chronic systolic heart failure ([place]) 12/14/2015  . Chest pain 12/14/2015  . Angina decubitus ([place]) 12/14/2015  . Adiposity 01/06/2013  . Arthritis of knee, degenerative 01/06/2013  . OSA (obstructive sleep apnea) 11/30/2011  . Cardiac arrest ([place]) 11/13/2011  . Diabetes mellitus type 2, uncontrolled ([place]) 11/13/2011    Past  Surgical History:  Procedure Laterality Date  . BACK SURGERY    . COLONOSCOPY N/A 09/12/2016   Procedure: COLONOSCOPY;  Surgeon: Clarene Essex, MD;  Location: [facility] ENDOSCOPY;  Service: Endoscopy;  Laterality: N/A;  . CYSTOSCOPY W/ URETERAL STENT PLACEMENT Left 01/04/2013   Procedure: CYSTOSCOPY WITH RETROGRADE PYELOGRAM/URETERAL STENT PLACEMENT;  Surgeon: Bernestine Amass, MD;  Location: [facility] ORS;  Service: Urology;  Laterality: Left;  . CYSTOSCOPY WITH RETROGRADE PYELOGRAM, URETEROSCOPY AND STENT PLACEMENT Left 01/26/2013   Procedure: CYSTOSCOPY, JJ STENT REMOVAL, LEFT URETEROSCOPY, ;  Surgeon: Bernestine Amass, MD;  Location: [facility] ORS;  Service: Urology;  Laterality: Left;  CYSTO, JJ STENT REMOVAL, LEFT URETEROSCOPY   . FOOT SURGERY    . HERNIA REPAIR    . HOLMIUM LASER APPLICATION Left 05/12/7516   Procedure: HOLMIUM LASER LITHOTRIPSY ;  Surgeon: Bernestine Amass, MD;  Location: [facility] ORS;  Service: Urology;  Laterality: Left;  . KNEE SURGERY     left  . PARTIAL HYSTERECTOMY       OB History   No obstetric history on file.      Home Medications    Prior to Admission medications   Medication Sig Start Date End Date Taking? Authorizing Provider  acetaminophen (TYLENOL) 500 MG tablet Take 500 mg by mouth every 6 (six) hours as needed.    [provider]  amLODipine (NORVASC) 5 MG tablet Take 1 tablet (5 mg total) by  mouth daily. 08/29/17   Medina-Vargas, Monina C, NP  aspirin 325 MG tablet Take 1 tablet (325 mg total) by mouth at bedtime. 07/15/17   Arrien, Jimmy Picket, MD  BD INSULIN SYRINGE U/F 31G X 5/16" 0.5 ML MISC AS DIRECTED ONCE A DAY 03/02/19   [provider]  betamethasone dipropionate (DIPROLENE) 0.05 % cream Apply 1 application topically 2 (two) times daily. 08/29/17   Medina-Vargas, Monina C, NP  bimatoprost (LUMIGAN) 0.01 % SOLN Place 1 drop into both eyes at bedtime. 08/29/17   Medina-Vargas, Monina C, NP  blood glucose meter kit and supplies KIT Dispense based on  patient and insurance preference. Use up to four times daily as directed. (FOR ICD-9 250.00, 250.01). 09/02/17   Medina-Vargas, Monina C, NP  cephALEXin (KEFLEX) 500 MG capsule TAKE 1 CAPSULE BY MOUTH THREE TIMES A DAY FOR 10 DAYS 07/07/18   [provider]  clobetasol ointment (TEMOVATE) 4.78 % 1 APPLICATION TWICE A DAY, AVOID OPEN WOUNDS AS NEEDED EXTERNALLY 30 DAYS 07/08/18   [provider]  cyclobenzaprine (FLEXERIL) 10 MG tablet  03/12/19   [provider]  fluconazole (DIFLUCAN) 150 MG tablet TAKE 1 TABLET BY MOUTH ONCE 07/24/18   [provider]  furosemide (LASIX) 20 MG tablet Take 1 tablet (20 mg total) by mouth daily. 08/29/17   Medina-Vargas, Monina C, NP  gabapentin (NEURONTIN) 300 MG capsule  03/11/19   [provider]  HYDROcodone-acetaminophen (NORCO/VICODIN) 5-325 MG tablet Take 1 tablet by mouth every 6 (six) hours as needed for moderate pain or severe pain. 08/29/17   Medina-Vargas, Monina C, NP  insulin aspart (NOVOLOG) 100 UNIT/ML FlexPen Inject 3 Units into the skin See admin instructions. Before lunch and dinner. 08/29/17   Medina-Vargas, Monina C, NP  insulin aspart (NOVOLOG) 100 UNIT/ML injection Inject 3 Units See admin instructions into the skin. Take before lunch and dinner    [provider]  Insulin Glargine (LANTUS SOLOSTAR) 100 UNIT/ML Solostar Pen Inject 24 Units into the skin at bedtime. 08/29/17   Medina-Vargas, Monina C, NP  JARDIANCE 10 MG TABS tablet Take 10 mg by mouth daily. 03/09/19   [provider]  lisinopril (PRINIVIL,ZESTRIL) 20 MG tablet Take 1 tablet (20 mg total) by mouth daily before breakfast. 08/29/17   Medina-Vargas, Monina C, NP  magnesium hydroxide (MILK OF MAGNESIA) 400 MG/5ML suspension Take 15 mLs by mouth daily as needed for mild constipation.    [provider]  metoprolol succinate (TOPROL-XL) 100 MG 24 hr tablet Take 1 tablet (100 mg total) by mouth daily before breakfast. Take  with or immediately following a meal. 08/29/17   Medina-Vargas, Monina C, NP  NOVOLIN N 100 UNIT/ML injection INJECT 10 UNITS TWICE A DAY 08/08/18   [provider]  ONGLYZA 5 MG TABS tablet Take 1 tablet (5 mg total) by mouth daily. 08/29/17   Medina-Vargas, Monina C, NP  oxybutynin (DITROPAN) 5 MG tablet Take 1 tablet (5 mg total) by mouth daily. 08/29/17   Medina-Vargas, Monina C, NP  pantoprazole (PROTONIX) 40 MG tablet Take 1 tablet (40 mg total) by mouth daily. 08/29/17   Medina-Vargas, Monina C, NP  potassium chloride (K-DUR) 10 MEQ tablet Take 1 tablet (10 mEq total) by mouth daily. 08/29/17   Medina-Vargas, Monina C, NP  pravastatin (PRAVACHOL) 40 MG tablet Take 1 tablet (40 mg total) by mouth at bedtime. 08/29/17   Medina-Vargas, Monina C, NP  traMADol (ULTRAM) 50 MG tablet Take 2 tablets (100 mg total) by  mouth every 8 (eight) hours as needed for severe pain. 08/29/17   Medina-Vargas, Senaida Lange, NP    Family History Family History  Problem Relation Age of Onset  . Asthma Mother     Social History Social History   Tobacco Use  . Smoking status: Never Smoker  . Smokeless tobacco: Never Used  Substance Use Topics  . Alcohol use: No  . Drug use: No     Allergies   Contrast media [iodinated diagnostic agents]   Review of Systems Review of Systems  Constitutional: Negative for chills and fever.  HENT: Negative for ear pain and sore throat.   Eyes: Negative for pain and visual disturbance.  Respiratory: Positive for shortness of breath. Negative for cough.   Cardiovascular: Positive for chest pain. Negative for palpitations.  Gastrointestinal: Negative for abdominal pain and vomiting.  Genitourinary: Negative for dysuria and hematuria.  Musculoskeletal: Negative for arthralgias and back pain.  Skin: Negative for color change and rash.  Neurological: Negative for seizures and syncope.  All other systems reviewed and are negative.    Physical Exam Updated  Vital Signs BP (!) 159/73 (BP Location: Right Arm)   Pulse 64   Temp (!) 97.5 F (36.4 C) (Oral)   Resp 16   SpO2 100%   Physical Exam Vitals signs and nursing note reviewed.  Constitutional:      General: She is not in acute distress.    Appearance: She is well-developed.  HENT:     Head: Normocephalic and atraumatic.  Eyes:     Conjunctiva/sclera: Conjunctivae normal.  Neck:     Musculoskeletal: Neck supple.  Cardiovascular:     Rate and Rhythm: Normal rate and regular rhythm.     Heart sounds: No murmur.  Pulmonary:     Effort: Pulmonary effort is normal. No respiratory distress.     Breath sounds: Normal breath sounds.  Abdominal:     Palpations: Abdomen is soft.     Tenderness: There is no abdominal tenderness.  Musculoskeletal:     Comments: B/l pitting edema  Skin:    General: Skin is warm and dry.  Neurological:     Mental Status: She is alert.      ED Treatments / Results  Labs (all labs ordered are listed, but only abnormal results are displayed) Labs Reviewed  CBC WITH DIFFERENTIAL/PLATELET - Abnormal; Notable for the following components:      Result Value   Hemoglobin 10.8 (*)    MCH 25.5 (*)    MCHC 29.5 (*)    All other components within normal limits  BASIC METABOLIC PANEL  BRAIN NATRIURETIC PEPTIDE  TROPONIN I (HIGH SENSITIVITY)    EKG None  Radiology Dg Chest Portable 1 View  Result Date: 06/25/2019 CLINICAL DATA:  Chest pressure, shortness of breath. EXAM: PORTABLE CHEST 1 VIEW COMPARISON:  07/28/2017 FINDINGS: No signs of dense consolidation or pleural effusion. Heart size remains enlarged with ectatic thoracic aorta. Healed right humeral fracture, no acute bone finding. IMPRESSION: No signs of acute cardiopulmonary disease with marked cardiomegaly. Electronically Signed   By: Zetta Bills M.D.   On: 06/25/2019 17:28    Procedures Procedures (including critical care time)  Medications Ordered in ED Medications - No data to  display   Initial Impression / Assessment and Plan / ED Course  I have reviewed the triage vital signs and the nursing notes.  Pertinent labs & imaging results that were available during my care of the patient were reviewed by  me and considered in my medical decision making (see chart for details).  Clinical Course as of Jun 24 2346  Thu Jun 25, 2019  1749 Completed initial assessment, reviewed chart and [facility] physician note from today   [RD]  1930 D/w McDowel - recommends medicine admit, echo   [RD]    Clinical Course User Index [RD] Lucrezia Starch, MD       63 year old lady presents the ER new onset chest pain, shortness of breath, exertional.  EKG without clear ischemic changes, initial troponin 12.  Report of weight gain prior history of heart failure however chest x-ray was clear, lungs not edematous, not clinically fluid overloaded.  Given this new onset exertional chest discomfort, consulted cardiology.  Recommended medicine admission for ACS rule out, repeat echocardiogram.  Discussed case with hospitalist Dr. Myna Hidalgo who agrees to admit.  No ongoing chest pain in the department.  Final Clinical Impressions(s) / ED Diagnoses   Final diagnoses:  Chest pain, unspecified type  Essential hypertension    ED Discharge Orders    None       Lucrezia Starch, MD 06/25/19 2349

## 2019-06-25 NOTE — H&P (Signed)
History and Physical    Whitney [name] WVP:[id number] DOB: [birth year] DOA: 06/25/2019  PCP: [name], MD   Patient coming from: Home   Chief Complaint: Exertional SOB and chest discomfort, wt gain    HPI: Whitney [name] is a 63 y.o. female with medical history significant for coronary artery disease, history of cardiomyopathy that subsequently resolved, insulin-dependent diabetes mellitus, hypertension, and OSA on CPAP, presented to emergency department for evaluation of shortness of breath and chest discomfort.  Patient reports intermittent chest discomfort and dyspnea over the past few days, and also reported a 20 lb wt gain in the past month.  She describes the chest discomfort as a tightness, localized to the central chest, associated with a fluttering sensation, and shortness of breath.  Episodes have been self-limited, resolving within a couple minutes.  These have been occurring at rest while in bed, possibly worse with certain movements such as sitting up in bed.  They have also occurred with activity, though the patient is fairly sedentary.  She denies any recent shortness of breath, fevers, chills, or sick contacts.  ED Course: Upon arrival to the ED, patient is found to be afebrile, saturating well on room air, slightly tachypneic, and with blood pressure 190/80.  EKG features a sinus rhythm with rate 56 and LVH.  Chest x-ray is notable for marked cardiomegaly without any acute cardiopulmonary disease.  Chemistry panel notable for potassium of 3.2.  Cardiology was contacted by the ED physician and recommended observation on the medical service for repeat troponin, echocardiogram, and formal consultation with cardiology in the morning.  Review of Systems:  All other systems reviewed and apart from HPI, are negative.  Past Medical History:  Diagnosis Date  . Arthritis    left knee,right hip  . Cardiac arrest ([place])   . Depression   . Diabetes mellitus   . GERD (gastroesophageal  reflux disease)   . Glaucoma   . H/O cardiac arrest 11/2011  . Heart murmur   . Hyperlipidemia   . Hypertension   . Myocardial infarction ([place]) 2013  . Sleep apnea    Bring machine,mask and tubing    Past Surgical History:  Procedure Laterality Date  . BACK SURGERY    . COLONOSCOPY N/A 09/12/2016   Procedure: COLONOSCOPY;  Surgeon: Clarene Essex, MD;  [facility];  Service: Endoscopy;  Laterality: N/A;  . CYSTOSCOPY W/ URETERAL STENT PLACEMENT Left 01/04/2013   Procedure: CYSTOSCOPY WITH RETROGRADE PYELOGRAM/URETERAL STENT PLACEMENT;  Surgeon: Bernestine Amass, MD;  Location: [facility];  Service: Urology;  Laterality: Left;  . CYSTOSCOPY WITH RETROGRADE PYELOGRAM, URETEROSCOPY AND STENT PLACEMENT Left 01/26/2013   Procedure: CYSTOSCOPY, JJ STENT REMOVAL, LEFT URETEROSCOPY, ;  Surgeon: Bernestine Amass, MD;  Location[facility];  Service: Urology;  Laterality: Left;  CYSTO, JJ STENT REMOVAL, LEFT URETEROSCOPY   . FOOT SURGERY    . HERNIA REPAIR    . HOLMIUM LASER APPLICATION Left 5/46/2703   Procedure: HOLMIUM LASER LITHOTRIPSY ;  Surgeon: Bernestine Amass, MD;  [facility];  Service: Urology;  Laterality: Left;  . KNEE SURGERY     left  . PARTIAL HYSTERECTOMY       reports that she has never smoked. She has never used smokeless tobacco. She reports that she does not drink alcohol or use drugs.  Allergies  Allergen Reactions  . Contrast Media [Iodinated Diagnostic Agents] Anaphylaxis    Family History  Problem Relation Age of Onset  . Asthma Mother  Prior to Admission medications   Medication Sig Start Date End Date Taking? Authorizing Provider  aspirin 325 MG tablet Take 1 tablet (325 mg total) by mouth at bedtime. 07/15/17  Yes Arrien, Jimmy Picket, MD  Chlorphen-Phenyleph-APAP (CORICIDIN D COLD/FLU/SINUS) 2-5-325 MG TABS Take 1 tablet by mouth every 6 (six) hours as needed (for congestion).   Yes [provider]  clobetasol ointment (TEMOVATE) 5.05 %  Apply 1 application topically See admin instructions. 1 APPLICATION TWICE A DAY, AVOID OPEN WOUNDS AS NEEDED EXTERNALLY 07/08/18  Yes [provider]  cyclobenzaprine (FLEXERIL) 10 MG tablet Take 10 mg by mouth 2 (two) times daily.  03/12/19  Yes [provider]  docusate sodium (COLACE) 100 MG capsule Take 200 mg by mouth 2 (two) times daily as needed for mild constipation or moderate constipation.   Yes [provider]  furosemide (LASIX) 20 MG tablet Take 1 tablet (20 mg total) by mouth daily. 08/29/17  Yes Medina-Vargas, Monina C, NP  gabapentin (NEURONTIN) 300 MG capsule Take 300 mg by mouth See admin instructions. Take 300 mg by mouth two times a day and an additional 300 mg once daily as needed for nerve pain 03/11/19  Yes [provider]  JARDIANCE 25 MG TABS tablet Take 25 mg by mouth daily. 06/03/19  Yes [provider]  lisinopril (PRINIVIL,ZESTRIL) 20 MG tablet Take 1 tablet (20 mg total) by mouth daily before breakfast. 08/29/17  Yes Medina-Vargas, Monina C, NP  metoprolol succinate (TOPROL-XL) 100 MG 24 hr tablet Take 1 tablet (100 mg total) by mouth daily before breakfast. Take with or immediately following a meal. 08/29/17  Yes Medina-Vargas, Monina C, NP  NOVOLIN N 100 UNIT/ML injection Inject 15 Units into the skin 2 (two) times daily after a meal.  08/08/18  Yes [provider]  oxybutynin (DITROPAN) 5 MG tablet Take 1 tablet (5 mg total) by mouth daily. 08/29/17  Yes Medina-Vargas, Monina C, NP  pantoprazole (PROTONIX) 40 MG tablet Take 1 tablet (40 mg total) by mouth daily. 08/29/17  Yes Medina-Vargas, Monina C, NP  pravastatin (PRAVACHOL) 40 MG tablet Take 1 tablet (40 mg total) by mouth at bedtime. 08/29/17  Yes Medina-Vargas, Monina C, NP  traMADol (ULTRAM) 50 MG tablet Take 2 tablets (100 mg total) by mouth every 8 (eight) hours as needed for severe pain. 08/29/17  Yes Medina-Vargas, Monina C, NP  amLODipine (NORVASC) 5 MG tablet  Take 1 tablet (5 mg total) by mouth daily. Patient not taking: Reported on 06/25/2019 08/29/17   Medina-Vargas, Jaymes Graff C, NP  BD INSULIN SYRINGE U/F 31G X 5/16" 0.5 ML MISC as directed.  03/02/19   [provider]  betamethasone dipropionate (DIPROLENE) 0.05 % cream Apply 1 application topically 2 (two) times daily. Patient not taking: Reported on 06/25/2019 08/29/17   Medina-Vargas, Monina C, NP  bimatoprost (LUMIGAN) 0.01 % SOLN Place 1 drop into both eyes at bedtime. Patient not taking: Reported on 06/25/2019 08/29/17   Medina-Vargas, Monina C, NP  blood glucose meter kit and supplies KIT Dispense based on patient and insurance preference. Use up to four times daily as directed. (FOR ICD-9 250.00, 250.01). 09/02/17   Medina-Vargas, Monina C, NP  HYDROcodone-acetaminophen (NORCO/VICODIN) 5-325 MG tablet Take 1 tablet by mouth every 6 (six) hours as needed for moderate pain or severe pain. Patient not taking: Reported on 06/25/2019 08/29/17   Medina-Vargas, Monina C, NP  insulin aspart (NOVOLOG) 100 UNIT/ML FlexPen Inject 3 Units into the skin See admin instructions. Before  lunch and dinner. 08/29/17   Medina-Vargas, Monina C, NP  Insulin Glargine (LANTUS SOLOSTAR) 100 UNIT/ML Solostar Pen Inject 24 Units into the skin at bedtime. Patient not taking: Reported on 06/25/2019 08/29/17   Medina-Vargas, Monina C, NP  ONGLYZA 5 MG TABS tablet Take 1 tablet (5 mg total) by mouth daily. Patient not taking: Reported on 06/25/2019 08/29/17   Medina-Vargas, Monina C, NP  potassium chloride (K-DUR) 10 MEQ tablet Take 1 tablet (10 mEq total) by mouth daily. Patient not taking: Reported on 06/25/2019 08/29/17   Nickola Major, NP    Physical Exam: Vitals:   06/25/19 1915 06/25/19 1930 06/25/19 1945 06/25/19 2000  BP: (!) 174/90 (!) 172/74 (!) 173/78 (!) 170/71  Pulse: (!) 57 (!) 59 61 60  Resp: 20 20 (!) 23 (!) 21  Temp:      TempSrc:      SpO2: 99% 100% 99% 98%    Constitutional: NAD, calm   Eyes: PERTLA, lids and conjunctivae normal  ENMT: Mucous membranes are moist. Posterior pharynx clear of any exudate or lesions.   Neck: normal, supple, no masses, no thyromegaly Respiratory: no wheezing, no crackles. Normal respiratory effort. No accessory muscle use.  Cardiovascular: S1 & S2 heard, regular rate and rhythm. Pretibial pitting edema bilaterally. Abdomen: No distension, no tenderness, soft. Bowel sounds active.  Musculoskeletal: no clubbing / cyanosis. No joint deformity upper and lower extremities.   Skin: no significant rashes, lesions, ulcers. Warm, dry, well-perfused. Neurologic: CN 2-12 grossly intact. Sensation intact. Moving all extremities.  Psychiatric: Alert and oriented to person, place, and situation. Calm, cooperative.   Labs on Admission: I have personally reviewed following labs and imaging studies  CBC: Recent Labs  Lab 06/25/19 1738  WBC 8.7  NEUTROABS 6.0  HGB 10.8*  HCT 36.6  MCV 86.5  PLT 240   Basic Metabolic Panel: Recent Labs  Lab 06/25/19 1738  NA 141  K 3.2*  CL 106  CO2 25  GLUCOSE 141*  BUN 10  CREATININE 0.64  CALCIUM 8.7*   GFR: CrCl cannot be calculated (Unknown ideal weight.). Liver Function Tests: No results for input(s): AST, ALT, ALKPHOS, BILITOT, PROT, ALBUMIN in the last 168 hours. No results for input(s): LIPASE, AMYLASE in the last 168 hours. No results for input(s): AMMONIA in the last 168 hours. Coagulation Profile: No results for input(s): INR, PROTIME in the last 168 hours. Cardiac Enzymes: No results for input(s): CKTOTAL, CKMB, CKMBINDEX, TROPONINI in the last 168 hours. BNP (last 3 results) No results for input(s): PROBNP in the last 8760 hours. HbA1C: No results for input(s): HGBA1C in the last 72 hours. CBG: No results for input(s): GLUCAP in the last 168 hours. Lipid Profile: No results for input(s): CHOL, HDL, LDLCALC, TRIG, CHOLHDL, LDLDIRECT in the last 72 hours. Thyroid Function Tests: No  results for input(s): TSH, T4TOTAL, FREET4, T3FREE, THYROIDAB in the last 72 hours. Anemia Panel: No results for input(s): VITAMINB12, FOLATE, FERRITIN, TIBC, IRON, RETICCTPCT in the last 72 hours. Urine analysis:    Component Value Date/Time   COLORURINE YELLOW 07/12/2017 1826   APPEARANCEUR CLEAR 07/12/2017 1826   LABSPEC 1.010 07/12/2017 1826   PHURINE 6.0 07/12/2017 1826   GLUCOSEU 50 (A) 07/12/2017 1826   HGBUR SMALL (A) 07/12/2017 1826   BILIRUBINUR NEGATIVE 07/12/2017 1826   KETONESUR 20 (A) 07/12/2017 1826   PROTEINUR 100 (A) 07/12/2017 1826   UROBILINOGEN 0.2 01/27/2013 1004   NITRITE NEGATIVE 07/12/2017 1826   LEUKOCYTESUR NEGATIVE 07/12/2017 1826  Sepsis Labs: _0 (procalcitonin:4,lacticidven:4) )No results found for this or any previous visit (from the past 240 hour(s)).   Radiological Exams on Admission: Dg Chest Portable 1 View  Result Date: 06/25/2019 CLINICAL DATA:  Chest pressure, shortness of breath. EXAM: PORTABLE CHEST 1 VIEW COMPARISON:  07/28/2017 FINDINGS: No signs of dense consolidation or pleural effusion. Heart size remains enlarged with ectatic thoracic aorta. Healed right humeral fracture, no acute bone finding. IMPRESSION: No signs of acute cardiopulmonary disease with marked cardiomegaly. Electronically Signed   By: Zetta Bills M.D.   On: 06/25/2019 17:28    EKG: Independently reviewed. Sinus rhythm, rate 56, LVH.   Assessment/Plan   1. Chest pain; CAD  - Patient has hx of CAD with low-risk stress test in 2017 presenting with several days of episodic chest discomfort and dyspnea  - She also describes a fluttering sensation in chest during these episodes and DDx includes UA, CHF, transient arrhythmia, less likely PE given absence of cough or hypoxia  - Initial HS troponin is normal at 12, EKG with SR and borderline T-wave abnormalities similar to prior, and CXR with cardiomegaly but no acute findings  - She was treated with ASA 324 mg by EMS  pta  - No anginal complaints since arrival in ED  - Continue cardiac monitoring, check another troponin, echocardiogram, continue aspirin, statin, beta-blocker, and ACE-i    2. History of cardiomyopathy  - Patient has hx of cardiomyopathy that had resolved  - She reports recent wt gain has pitting edema to b/l LEs that she reports to be chronic, no apparent dyspnea or evidence for pulmonary edema  - SLIV, continue Lasix, continue beta-blocker and ACE-i as tolerated,  follow daily wt, and update echo as above    3. Insulin-dependent DM  - No recent A1c in EMR, serum glucose is 141 in ED  - Continue glycemic-control with insulin   4. Hypertension  - SBP 180 in ED  - Treat with metoprolol now and continue metoprolol, lisinopril    5. Hypokalemia - Serum potassium is 3.0 on admission  - Replaced, repeat chem panel in am    PPE: Mask, face shield  DVT prophylaxis: Lovenox  Code Status: Full  Family Communication: Discussed with patient  Consults called: None  Admission status: Observation     Vianne Bulls, MD [facility] Pager [phone]  If 7PM-7AM, please contact night-coverage [email or web address] Password [facility]  06/25/2019, 8:08 PM

## 2019-06-25 NOTE — ED Notes (Addendum)
Admitting MD ( Dr. Myna Hidalgo) notified on pt's hypertension .

## 2019-06-25 NOTE — ED Triage Notes (Signed)
Patient presents to the ED by EMS with pt from PCP office for f/u, reporting a 20 lb weight gain in the last month, exertional chest tightness and shortness of breath since Monday. Received 324 ASA PTA. Denies pain at this time.  HTN 172/84 with pulse 60s and. Hx Cardiac arrest 2019.

## 2019-06-25 NOTE — Progress Notes (Signed)
Patient states that they haven't worn CPAP since 2018. RT will continue to monitor throughout the night as needed

## 2019-06-25 NOTE — ED Notes (Signed)
Pts sister is at the bedside to obtain the pts keys to get her vehicle.

## 2019-06-25 NOTE — ED Notes (Signed)
ED TO INPATIENT HANDOFF REPORT  ED Nurse Name and Phone #:  Clydene Laming [phone]  S Name/Age/Gender Whitney [name] 63 y.o. female Room/Bed: 030C/030C  Code Status   Code Status: Prior  Home/SNF/Other Home {Patient oriented x4 Is this baseline? yes  Triage Complete: Triage complete  Chief Complaint 63yo f, CP, SHOB  Triage Note Patient presents to the ED by EMS with pt from PCP office for f/u, reporting a 20 lb weight gain in the last month, exertional chest tightness and shortness of breath since Monday. Received 324 ASA PTA. Denies pain at this time.  HTN 172/84 with pulse 60s and. Hx Cardiac arrest 2019.   Allergies Allergies  Allergen Reactions  . Contrast Media [Iodinated Diagnostic Agents] Anaphylaxis    Level of Care/Admitting Diagnosis ED Disposition    ED Disposition Condition Comment   Admit  Hospital Area: [facility] [[id number]]  Level of Care: Telemetry Cardiac [103]  I expect the patient will be discharged within 24 hours: Yes  LOW acuity---Tx typically complete <24 hrs---ACUTE conditions typically can be evaluated <24 hours---LABS likely to return to acceptable levels <24 hours---IS near functional baseline---EXPECTED to return to current living arrangement---NOT newly hypoxic: Meets criteria for 5C-Observation unit  Covid Evaluation: Asymptomatic Screening Protocol (No Symptoms)  Diagnosis: Chest pain [id number]  Admitting Physician: Vianne Bulls [id number]  Attending Physician: Vianne Bulls [id number]  PT Class (Do Not Modify): Observation [104]  PT Acc Code (Do Not Modify): Observation [10022]       B Medical/Surgery History Past Medical History:  Diagnosis Date  . Arthritis    left knee,right hip  . Cardiac arrest ([place])   . Depression   . Diabetes mellitus   . GERD (gastroesophageal reflux disease)   . Glaucoma   . H/O cardiac arrest 11/2011  . Heart murmur   . Hyperlipidemia   . Hypertension   . Myocardial infarction  ([place]) 2013  . Sleep apnea    Bring machine,mask and tubing   Past Surgical History:  Procedure Laterality Date  . BACK SURGERY    . COLONOSCOPY N/A 09/12/2016   Procedure: COLONOSCOPY;  Surgeon: Clarene Essex, MD;  [facility];  Service: Endoscopy;  Laterality: N/A;  . CYSTOSCOPY W/ URETERAL STENT PLACEMENT Left 01/04/2013   Procedure: CYSTOSCOPY WITH RETROGRADE PYELOGRAM/URETERAL STENT PLACEMENT;  Surgeon: Bernestine Amass, MD;  [facility];  Service: Urology;  Laterality: Left;  . CYSTOSCOPY WITH RETROGRADE PYELOGRAM, URETEROSCOPY AND STENT PLACEMENT Left 01/26/2013   Procedure: CYSTOSCOPY, JJ STENT REMOVAL, LEFT URETEROSCOPY, ;  Surgeon: Bernestine Amass, MD;  [facility];  Service: Urology;  Laterality: Left;  CYSTO, JJ STENT REMOVAL, LEFT URETEROSCOPY   . FOOT SURGERY    . HERNIA REPAIR    . HOLMIUM LASER APPLICATION Left [id number]   Procedure: HOLMIUM LASER LITHOTRIPSY ;  Surgeon: Bernestine Amass, MD;  [facility];  Service: Urology;  Laterality: Left;  . KNEE SURGERY     left  . PARTIAL HYSTERECTOMY       A IV Location/Drains/Wounds Patient Lines/Drains/Airways Status   Active Line/Drains/Airways    Name:   Placement date:   Placement time:   Site:   Days:   Peripheral IV 06/25/19 Anterior;Left;Proximal Forearm   06/25/19    1706    Forearm   less than 1   External Urinary Catheter   07/12/17    1950    -   [id number]  Intake/Output Last 24 hours No intake or output data in the 24 hours ending 06/25/19 2031  Labs/Imaging Results for orders placed or performed during the hospital encounter of 06/25/19 (from the past 48 hour(s))  CBC with Differential     Status: Abnormal   Collection Time: 06/25/19  5:38 PM  Result Value Ref Range   WBC 8.7 4.0 - 10.5 K/uL   RBC 4.23 3.87 - 5.11 MIL/uL   Hemoglobin 10.8 (L) 12.0 - 15.0 g/dL   HCT 36.6 36.0 - 46.0 %   MCV 86.5 80.0 - 100.0 fL   MCH 25.5 (L) 26.0 - 34.0 pg   MCHC 29.5 (L) 30.0 - 36.0 g/dL   RDW  15.0 11.5 - 15.5 %   Platelets 254 150 - 400 K/uL   nRBC 0.0 0.0 - 0.2 %   Neutrophils Relative % 69 %   Neutro Abs 6.0 1.7 - 7.7 K/uL   Lymphocytes Relative 23 %   Lymphs Abs 2.0 0.7 - 4.0 K/uL   Monocytes Relative 6 %   Monocytes Absolute 0.5 0.1 - 1.0 K/uL   Eosinophils Relative 1 %   Eosinophils Absolute 0.1 0.0 - 0.5 K/uL   Basophils Relative 0 %   Basophils Absolute 0.0 0.0 - 0.1 K/uL   Immature Granulocytes 1 %   Abs Immature Granulocytes 0.05 0.00 - 0.07 K/uL    Comment: Performed at [facility], [address], [place], [place] [id number]  Basic metabolic panel     Status: Abnormal   Collection Time: 06/25/19  5:38 PM  Result Value Ref Range   Sodium 141 135 - 145 mmol/L   Potassium 3.2 (L) 3.5 - 5.1 mmol/L   Chloride 106 98 - 111 mmol/L   CO2 25 22 - 32 mmol/L   Glucose, Bld 141 (H) 70 - 99 mg/dL   BUN 10 8 - 23 mg/dL   Creatinine, Ser 0.64 0.44 - 1.00 mg/dL   Calcium 8.7 (L) 8.9 - 10.3 mg/dL   GFR calc non Af Amer >60 >60 mL/min   GFR calc Af Amer >60 >60 mL/min   Anion gap 10 5 - 15    Comment: Performed at [facility], [place] [address] [place], [place] [address]  Brain natriuretic peptide     Status: None   Collection Time: 06/25/19  5:38 PM  Result Value Ref Range   B Natriuretic Peptide 65.6 0.0 - 100.0 pg/mL    Comment: Performed at [facility] [address], [place], [place] [address]  Troponin I (High Sensitivity)     Status: None   Collection Time: 06/25/19  5:38 PM  Result Value Ref Range   Troponin I (High Sensitivity) 12 <18 ng/L    Comment: (NOTE) Elevated high sensitivity troponin I (hsTnI) values and significant  changes across serial measurements may suggest ACS but many other  chronic and acute conditions are known to elevate hsTnI results.  Refer to the "Links" section for chest pain algorithms and additional  guidance. Performed at [facility], [place] [address] [place], [place] [address]   Troponin I (High  Sensitivity)     Status: None   Collection Time: 06/25/19  7:30 PM  Result Value Ref Range   Troponin I (High Sensitivity) 16 <18 ng/L    Comment: (NOTE) Elevated high sensitivity troponin I (hsTnI) values and significant  changes across serial measurements may suggest ACS but many other  chronic and acute conditions are known  to elevate hsTnI results.  Refer to the "Links" section for chest pain algorithms and additional  guidance. Performed at [facility], [place] [address] [place], [place] [address]   CBG monitoring, ED     Status: None   Collection Time: 06/25/19  8:18 PM  Result Value Ref Range   Glucose-Capillary 99 70 - 99 mg/dL   Dg Chest Portable 1 View  Result Date: 06/25/2019 CLINICAL DATA:  Chest pressure, shortness of breath. EXAM: PORTABLE CHEST 1 VIEW COMPARISON:  07/28/2017 FINDINGS: No signs of dense consolidation or pleural effusion. Heart size remains enlarged with ectatic thoracic aorta. Healed right humeral fracture, no acute bone finding. IMPRESSION: No signs of acute cardiopulmonary disease with marked cardiomegaly. Electronically Signed   By: Zetta Bills M.D.   On: 06/25/2019 17:28    Pending Labs Unresulted Labs (From admission, onward)    Start     Ordered   06/25/19 2005  SARS CORONAVIRUS 2 (TAT 6-24 HRS) Nasopharyngeal Nasopharyngeal Swab  (Asymptomatic/Tier 2 Patients Labs)  Once,   STAT    Question Answer Comment  Is this test for diagnosis or screening Screening   Symptomatic for COVID-19 as defined by CDC No   Hospitalized for COVID-19 No   Admitted to ICU for COVID-19 No   Previously tested for COVID-19 No   Resident in a congregate (group) care setting No   Employed in healthcare setting No   Pregnant No      06/25/19 2004   Signed and Held  HIV Antibody  (Routine Testing)  Tomorrow morning,   R     Signed and Held   Signed and Held  Hemoglobin A1c  Tomorrow morning,   R    Comments: To assess prior glycemic control    Signed and  Held   Signed and Held  Creatinine, serum  (enoxaparin (LOVENOX)    CrCl >/= 30 ml/min)  Weekly,   R    Comments: while on enoxaparin therapy    Signed and Held   Signed and Held  Comprehensive metabolic panel  Tomorrow morning,   R     Signed and Held   Signed and Held  CBC  Tomorrow morning,   R     Signed and Held   Signed and Held  Magnesium  Tomorrow morning,   R     Signed and Held          Vitals/Pain Today's Vitals   06/25/19 1930 06/25/19 1945 06/25/19 2000 06/25/19 2015  BP: (!) 172/74 (!) 173/78 (!) 170/71 (!) 161/86  Pulse: (!) 59 61 60 66  Resp: 20 (!) 23 (!) 21 17  Temp:      TempSrc:      SpO2: 100% 99% 98% 100%  PainSc:        Isolation Precautions No active isolations  Medications Medications  metoprolol tartrate (LOPRESSOR) tablet 25 mg (25 mg Oral Given 06/25/19 2020)    Mobility walks with device Low fall risk   Focused Assessments Pulmonary Assessment Handoff:  Lung sounds: Bilateral Breath Sounds: Clear O2 Device: Room Air     ,    R Recommendations: See Admitting Provider Note  Report given to:   Additional Notes:  Denies chest pain at this time /respirations unlabored.

## 2019-06-25 NOTE — ED Notes (Signed)
ED Provider at bedside. 

## 2019-06-26 ENCOUNTER — Encounter (HOSPITAL_COMMUNITY): Payer: Self-pay | Admitting: General Practice

## 2019-06-26 ENCOUNTER — Other Ambulatory Visit: Payer: Self-pay

## 2019-06-26 ENCOUNTER — Observation Stay (HOSPITAL_BASED_OUTPATIENT_CLINIC_OR_DEPARTMENT_OTHER): Payer: HMO

## 2019-06-26 DIAGNOSIS — Z23 Encounter for immunization: Secondary | ICD-10-CM | POA: Diagnosis not present

## 2019-06-26 DIAGNOSIS — I10 Essential (primary) hypertension: Secondary | ICD-10-CM

## 2019-06-26 DIAGNOSIS — I872 Venous insufficiency (chronic) (peripheral): Secondary | ICD-10-CM | POA: Diagnosis not present

## 2019-06-26 DIAGNOSIS — R0782 Intercostal pain: Secondary | ICD-10-CM | POA: Diagnosis not present

## 2019-06-26 DIAGNOSIS — Z20828 Contact with and (suspected) exposure to other viral communicable diseases: Secondary | ICD-10-CM | POA: Diagnosis not present

## 2019-06-26 DIAGNOSIS — R079 Chest pain, unspecified: Secondary | ICD-10-CM | POA: Diagnosis not present

## 2019-06-26 DIAGNOSIS — E1165 Type 2 diabetes mellitus with hyperglycemia: Secondary | ICD-10-CM

## 2019-06-26 DIAGNOSIS — Z8679 Personal history of other diseases of the circulatory system: Secondary | ICD-10-CM | POA: Diagnosis not present

## 2019-06-26 DIAGNOSIS — R0789 Other chest pain: Secondary | ICD-10-CM | POA: Diagnosis not present

## 2019-06-26 DIAGNOSIS — R600 Localized edema: Secondary | ICD-10-CM

## 2019-06-26 DIAGNOSIS — Z6841 Body Mass Index (BMI) 40.0 and over, adult: Secondary | ICD-10-CM | POA: Diagnosis not present

## 2019-06-26 DIAGNOSIS — I2510 Atherosclerotic heart disease of native coronary artery without angina pectoris: Secondary | ICD-10-CM | POA: Diagnosis not present

## 2019-06-26 DIAGNOSIS — I340 Nonrheumatic mitral (valve) insufficiency: Secondary | ICD-10-CM

## 2019-06-26 DIAGNOSIS — E876 Hypokalemia: Secondary | ICD-10-CM | POA: Diagnosis not present

## 2019-06-26 DIAGNOSIS — Z794 Long term (current) use of insulin: Secondary | ICD-10-CM | POA: Diagnosis not present

## 2019-06-26 DIAGNOSIS — G4733 Obstructive sleep apnea (adult) (pediatric): Secondary | ICD-10-CM | POA: Diagnosis not present

## 2019-06-26 LAB — COMPREHENSIVE METABOLIC PANEL
ALT: 11 U/L (ref 0–44)
AST: 11 U/L — ABNORMAL LOW (ref 15–41)
Albumin: 3 g/dL — ABNORMAL LOW (ref 3.5–5.0)
Alkaline Phosphatase: 91 U/L (ref 38–126)
Anion gap: 7 (ref 5–15)
BUN: 10 mg/dL (ref 8–23)
CO2: 25 mmol/L (ref 22–32)
Calcium: 8.5 mg/dL — ABNORMAL LOW (ref 8.9–10.3)
Chloride: 109 mmol/L (ref 98–111)
Creatinine, Ser: 0.56 mg/dL (ref 0.44–1.00)
GFR calc Af Amer: >60 mL/min (ref >60)
GFR calc non Af Amer: >60 mL/min (ref >60)
Glucose, Bld: 215 mg/dL — ABNORMAL HIGH (ref 70–99)
Potassium: 3.2 mmol/L — ABNORMAL LOW (ref 3.5–5.1)
Sodium: 141 mmol/L (ref 135–145)
Total Bilirubin: 1.2 mg/dL (ref 0.3–1.2)
Total Protein: 6.4 g/dL — ABNORMAL LOW (ref 6.5–8.1)

## 2019-06-26 LAB — CBC
HCT: 35.1 % — ABNORMAL LOW (ref 36.0–46.0)
Hemoglobin: 10.8 g/dL — ABNORMAL LOW (ref 12.0–15.0)
MCH: 26.2 pg (ref 26.0–34.0)
MCHC: 30.8 g/dL (ref 30.0–36.0)
MCV: 85 fL (ref 80.0–100.0)
Platelets: 249 10*3/uL (ref 150–400)
RBC: 4.13 MIL/uL (ref 3.87–5.11)
RDW: 15.2 % (ref 11.5–15.5)
WBC: 7.3 10*3/uL (ref 4.0–10.5)
nRBC: 0 % (ref 0.0–0.2)

## 2019-06-26 LAB — HEMOGLOBIN A1C
Hgb A1c MFr Bld: 7.5 % — ABNORMAL HIGH (ref 4.8–5.6)
Mean Plasma Glucose: 168.55 mg/dL

## 2019-06-26 LAB — GLUCOSE, CAPILLARY
Glucose-Capillary: 146 mg/dL — ABNORMAL HIGH (ref 70–99)
Glucose-Capillary: 177 mg/dL — ABNORMAL HIGH (ref 70–99)
Glucose-Capillary: 143 mg/dL — ABNORMAL HIGH (ref 70–99)
Glucose-Capillary: 161 mg/dL — ABNORMAL HIGH (ref 70–99)
Glucose-Capillary: 224 mg/dL — ABNORMAL HIGH (ref 70–99)

## 2019-06-26 LAB — ECHOCARDIOGRAM COMPLETE
Height: 63 in
Weight: 4857.17 oz

## 2019-06-26 LAB — MAGNESIUM: Magnesium: 1.9 mg/dL (ref 1.7–2.4)

## 2019-06-26 LAB — HIV ANTIBODY (ROUTINE TESTING W REFLEX): HIV Screen 4th Generation wRfx: NONREACTIVE

## 2019-06-26 LAB — SARS CORONAVIRUS 2 (TAT 6-24 HRS): SARS Coronavirus 2: NEGATIVE

## 2019-06-26 MED ORDER — POTASSIUM CHLORIDE CRYS ER 20 MEQ PO TBCR
40.0000 meq | EXTENDED_RELEASE_TABLET | Freq: Once | ORAL | Status: AC
  Administered 2019-06-26: 40 meq via ORAL
  Filled 2019-06-26: qty 2

## 2019-06-26 MED ORDER — INFLUENZA VAC A&B SA ADJ QUAD 0.5 ML IM PRSY
0.5000 mL | PREFILLED_SYRINGE | INTRAMUSCULAR | Status: AC
  Administered 2019-06-27: 0.5 mL via INTRAMUSCULAR
  Filled 2019-06-26: qty 0.5

## 2019-06-26 MED ORDER — PERFLUTREN LIPID MICROSPHERE
1.0000 mL | INTRAVENOUS | Status: AC | PRN
  Administered 2019-06-26: 2 mL via INTRAVENOUS
  Filled 2019-06-26: qty 10

## 2019-06-26 MED ORDER — LISINOPRIL 40 MG PO TABS
40.0000 mg | ORAL_TABLET | Freq: Every day | ORAL | Status: DC
  Administered 2019-06-27: 40 mg via ORAL
  Filled 2019-06-26 (×3): qty 1

## 2019-06-26 MED ORDER — LISINOPRIL 20 MG PO TABS
20.0000 mg | ORAL_TABLET | Freq: Once | ORAL | Status: AC
  Administered 2019-06-26: 20 mg via ORAL
  Filled 2019-06-26: qty 1

## 2019-06-26 MED ORDER — INSULIN GLARGINE 100 UNIT/ML ~~LOC~~ SOLN
15.0000 [IU] | Freq: Every day | SUBCUTANEOUS | Status: DC
  Administered 2019-06-26: 15 [IU] via SUBCUTANEOUS
  Filled 2019-06-26 (×2): qty 0.15

## 2019-06-26 NOTE — Evaluation (Signed)
Physical Therapy Evaluation Patient Details Name: Whitney [name] MRN: [id number] DOB: September 13, 1956 Today's Date: 06/26/2019   History of Present Illness  63 yo admitted with chest pain with negative troponin. PMHx: arthritis, depression, DM, glaucoma, HLD, HTN, CAD  Clinical Impression  Pt very pleasant and wanting to get OOB. Pt with increased struggle and assist to stand from bed and toilet with report of no consistent assist at home due to family/friends work. Pt states she normally uses lift chair during the day but sleeps in bed and can stand with use of holding onto surface at home. Pt with decreased strength, balance, function and mobility who will benefit from acute therapy to maximize mobility, safety and function with pt encouraged to use BSC or toilet not purewick and ambulate with staff. Pt states she is not very agreeable to HHPT as she doesn't want someone "poking around in my house". Pt will need to demonstrate mod I mobility for safe return home.     Follow Up Recommendations Home health PT    Equipment Recommendations  None recommended by PT    Recommendations for Other Services OT consult     Precautions / Restrictions Precautions Precautions: Fall      Mobility  Bed Mobility Overal bed mobility: Modified Independent             General bed mobility comments: pt able to transition from supine to sit with HOB 25 degrees and increased time to achieve sitting  Transfers Overall transfer level: Needs assistance   Transfers: Sit to/from Stand Sit to Stand: Min assist;From elevated surface         General transfer comment: min assist to rise from elevated surface with cues for sequence, use of foot rail and stabilized RW to stand as well as physical assist for anterior translation with 3 trials prior to being able to stand from bed. From toilet also assist for anterior translation with use of rail and stabilized RW with increased  time  Ambulation/Gait Ambulation/Gait assistance: Min guard Gait Distance (Feet): 200 Feet Assistive device: Rolling walker (2 wheeled) Gait Pattern/deviations: Step-through pattern;Decreased stride length;Trunk flexed;Wide base of support   Gait velocity interpretation: 1.31 - 2.62 ft/sec, indicative of limited community ambulator General Gait Details: pt with 3 standing rest breaks with fatigue limiting distance. Cues for proximity to RW and self regulation of activity  Stairs            Wheelchair Mobility    Modified Rankin (Stroke Patients Only)       Balance Overall balance assessment: Needs assistance   Sitting balance-Leahy Scale: Good     Standing balance support: Bilateral upper extremity supported Standing balance-Leahy Scale: Poor                               Pertinent Vitals/Pain Pain Assessment: No/denies pain    Home Living Family/patient expects to be discharged to:: Private residence Living Arrangements: Alone Available Help at Discharge: Friend(s);Available PRN/intermittently Type of Home: House Home Access: Ramped entrance     Home Layout: One level Home Equipment: Walker - 2 wheels;Cane - single point;Tub bench;Other (comment) Additional Comments: lift chair and adjustable bed without rails    Prior Function Level of Independence: Independent with assistive device(s)         Comments: uses RW most of the time, does her own housework and driving     Hand Dominance  Extremity/Trunk Assessment   Upper Extremity Assessment Upper Extremity Assessment: Generalized weakness    Lower Extremity Assessment Lower Extremity Assessment: Generalized weakness    Cervical / Trunk Assessment Cervical / Trunk Assessment: Kyphotic  Communication   Communication: No difficulties  Cognition Arousal/Alertness: Awake/alert Behavior During Therapy: WFL for tasks assessed/performed Overall Cognitive Status: Within  Functional Limits for tasks assessed                                        General Comments      Exercises     Assessment/Plan    PT Assessment Patient needs continued PT services  PT Problem List Decreased strength;Decreased mobility;Decreased activity tolerance;Decreased balance;Decreased knowledge of use of DME       PT Treatment Interventions DME instruction;Therapeutic activities;Gait training;Therapeutic exercise;Patient/family education;Functional mobility training;Balance training    PT Goals (Current goals can be found in the Care Plan section)  Acute Rehab PT Goals Patient Stated Goal: return to cooking for church PT Goal Formulation: With patient Time For Goal Achievement: 07/10/19 Potential to Achieve Goals: Fair    Frequency Min 3X/week   Barriers to discharge Decreased caregiver support      Co-evaluation               AM-PAC PT "6 Clicks" Mobility  Outcome Measure Help needed turning from your back to your side while in a flat bed without using bedrails?: A Little Help needed moving from lying on your back to sitting on the side of a flat bed without using bedrails?: A Little Help needed moving to and from a bed to a chair (including a wheelchair)?: A Little Help needed standing up from a chair using your arms (e.g., wheelchair or bedside chair)?: A Little Help needed to walk in hospital room?: A Little Help needed climbing 3-5 steps with a railing? : A Lot 6 Click Score: 17    End of Session Equipment Utilized During Treatment: Gait belt Activity Tolerance: Patient tolerated treatment well Patient left: in chair;with call bell/phone within reach;with chair alarm set Nurse Communication: Mobility status PT Visit Diagnosis: Other abnormalities of gait and mobility (R26.89);Muscle weakness (generalized) (M62.81)    Time: [id number] PT Time Calculation (min) (ACUTE ONLY): 32 min   Charges:   PT Evaluation $PT Eval Moderate  Complexity: 1 Mod PT Treatments $Gait Training: 8-22 mins        Najma Bozarth Pam Drown, PT Acute Rehabilitation Services Pager: [phone] Office: [phone]   Sandy Salaam Akasia Ahmad 06/26/2019, 12:39 PM

## 2019-06-26 NOTE — Progress Notes (Signed)
  Echocardiogram 2D Echocardiogram with definity has been performed.  Whitney [name] M 06/26/2019, 10:12 AM

## 2019-06-26 NOTE — Progress Notes (Signed)
Progress Note    Whitney [name]  [id number] DOB: 1956-06-05  DOA: 06/25/2019 PCP: [name], MD    Brief Narrative:     Medical records reviewed and are as summarized below:  Whitney [name] is an 63 y.o. female with medical history significant for coronary artery disease, history of cardiomyopathy that subsequently resolved, insulin-dependent diabetes mellitus, hypertension, and OSA on CPAP, presented to emergency department for evaluation of shortness of breath and chest discomfort.  Patient reports intermittent chest discomfort and dyspnea over the past few days, and also reported a 20 lb wt gain in the past month.    Assessment/Plan:   Principal Problem:   Chest pain Active Problems:   Diabetes mellitus type 2, uncontrolled (HCC)   Hypokalemia   History of cardiomyopathy   Chest pain; CAD -- h/o cardiac arrest/MI-- PEA in 2013 - Patient has hx of CAD with low-risk stress test in 2017 presenting with several days of episodic chest discomfort and dyspnea  - She also describes a fluttering sensation in chest during these episodes -- tele reviewed, No PVCs (had episode this AM) - Initial HS troponin is normal at 12, EKG with SR and borderline T-wave abnormalities similar to prior, and CXR with cardiomegaly but no acute findings  - She was treated with ASA 324 mg by EMS pta  -echo pending -cardiology consult  Insulin-dependent DM  -SSI -diabetic diet  Hypertension  - resume home meds  Hypokalemia - Replace  Morbid obesity Body mass index is 53.78 kg/m.   Family Communication/Anticipated D/C date and plan/Code Status   DVT prophylaxis: Lovenox ordered. Code Status: Full Code.  Family Communication:  Disposition Plan:    Medical Consultants:    cards  Subjective:   Has chest pressure and neck pain-- at rest and when straining to have a BM No chest wall tenderness  Objective:    Vitals:   06/26/19 0448 06/26/19 0736 06/26/19 1025  06/26/19 1126  BP: 138/75 (!) 144/71  (!) 157/81  Pulse: 71 73 64 64  Resp: 16 18  18   Temp: 98.5 F (36.9 C) 98.3 F (36.8 C)  98.4 F (36.9 C)  TempSrc: Oral Oral  Oral  SpO2: 96% 96%  96%  Weight:      Height:        Intake/Output Summary (Last 24 hours) at 06/26/2019 1131 Last data filed at 06/26/2019 1044 Gross per 24 hour  Intake 360 ml  Output 675 ml  Net -315 ml   Filed Weights   06/25/19 2122 06/26/19 0400  Weight: (!) 138.5 kg (!) 137.7 kg    Exam: Up walking with PT rrr-- tele reviewed- NSR Clear no wheezing Obese Chronic skin changes on LE  Data Reviewed:   I have personally reviewed following labs and imaging studies:  Labs: Labs show the following:   Basic Metabolic Panel: Recent Labs  Lab 06/25/19 1738 06/26/19 0418  NA 141 141  K 3.2* 3.2*  CL 106 109  CO2 25 25  GLUCOSE 141* 215*  BUN 10 10  CREATININE 0.64 0.56  CALCIUM 8.7* 8.5*  MG  --  1.9   GFR Estimated Creatinine Clearance: 95.7 mL/min (by C-G formula based on SCr of 0.56 mg/dL). Liver Function Tests: Recent Labs  Lab 06/26/19 0418  AST 11*  ALT 11  ALKPHOS 91  BILITOT 1.2  PROT 6.4*  ALBUMIN 3.0*   No results for input(s): LIPASE, AMYLASE in the last 168 hours. No results for input(s):  AMMONIA in the last 168 hours. Coagulation profile No results for input(s): INR, PROTIME in the last 168 hours.  CBC: Recent Labs  Lab 06/25/19 1738 06/26/19 0418  WBC 8.7 7.3  NEUTROABS 6.0  --   HGB 10.8* 10.8*  HCT 36.6 35.1*  MCV 86.5 85.0  PLT 254 249   Cardiac Enzymes: No results for input(s): CKTOTAL, CKMB, CKMBINDEX, TROPONINI in the last 168 hours. BNP (last 3 results) No results for input(s): PROBNP in the last 8760 hours. CBG: Recent Labs  Lab 06/25/19 2018 06/25/19 2130 06/26/19 0526  GLUCAP 99 146* 177*   D-Dimer: No results for input(s): DDIMER in the last 72 hours. Hgb A1c: Recent Labs    06/26/19 0418  HGBA1C 7.5*   Lipid Profile: No results  for input(s): CHOL, HDL, LDLCALC, TRIG, CHOLHDL, LDLDIRECT in the last 72 hours. Thyroid function studies: No results for input(s): TSH, T4TOTAL, T3FREE, THYROIDAB in the last 72 hours.  Invalid input(s): FREET3 Anemia work up: No results for input(s): VITAMINB12, FOLATE, FERRITIN, TIBC, IRON, RETICCTPCT in the last 72 hours. Sepsis Labs: Recent Labs  Lab 06/25/19 1738 06/26/19 0418  WBC 8.7 7.3    Microbiology Recent Results (from the past 240 hour(s))  SARS CORONAVIRUS 2 (TAT 6-24 HRS) Nasopharyngeal Nasopharyngeal Swab     Status: None   Collection Time: 06/25/19  8:21 PM   Specimen: Nasopharyngeal Swab  Result Value Ref Range Status   SARS Coronavirus 2 NEGATIVE NEGATIVE Final    Comment: (NOTE) SARS-CoV-2 target nucleic acids are NOT DETECTED. The SARS-CoV-2 RNA is generally detectable in upper and lower respiratory specimens during the acute phase of infection. Negative results do not preclude SARS-CoV-2 infection, do not rule out co-infections with other pathogens, and should not be used as the sole basis for treatment or other patient management decisions. Negative results must be combined with clinical observations, patient history, and epidemiological information. The expected result is Negative. Fact Sheet for Patients: SugarRoll.be Fact Sheet for Healthcare Providers: https://www.woods-mathews.com/ This test is not yet approved or cleared by the [place] FDA and  has been authorized for detection and/or diagnosis of SARS-CoV-2 by FDA under an Emergency Use Authorization (EUA). This EUA will remain  in effect (meaning this test can be used) for the duration of the COVID-19 declaration under Section 56 4(b)(1) of the Act, 21 U.S.C. section 360bbb-3(b)(1), unless the authorization is terminated or revoked sooner. Performed at [facility], [place] [address], [place], [place] [address]     Procedures and  diagnostic studies:  Dg Chest Portable 1 View  Result Date: 06/25/2019 CLINICAL DATA:  Chest pressure, shortness of breath. EXAM: PORTABLE CHEST 1 VIEW COMPARISON:  07/28/2017 FINDINGS: No signs of dense consolidation or pleural effusion. Heart size remains enlarged with ectatic thoracic aorta. Healed right humeral fracture, no acute bone finding. IMPRESSION: No signs of acute cardiopulmonary disease with marked cardiomegaly. Electronically Signed   By: Zetta Bills M.D.   On: 06/25/2019 17:28    Medications:   . aspirin EC  325 mg Oral QHS  . cyclobenzaprine  10 mg Oral BID  . enoxaparin (LOVENOX) injection  40 mg Subcutaneous Q24H  . furosemide  20 mg Oral Daily  . gabapentin  300 mg Oral BID  . [START ON 06/27/2019] influenza vaccine adjuvanted  0.5 mL Intramuscular Tomorrow-1000  . insulin aspart  0-5 Units Subcutaneous QHS  . insulin aspart  0-9 Units Subcutaneous TID WC  . insulin glargine  10 Units Subcutaneous QHS  .  lisinopril  20 mg Oral Daily  . metoprolol succinate  100 mg Oral Daily  . potassium chloride  40 mEq Oral Once  . pravastatin  40 mg Oral QHS  . sodium chloride flush  3 mL Intravenous Q12H   Continuous Infusions: . sodium chloride       LOS: 0 days   Geradine Girt  [facility]   How to contact the [facility] Attending or Consulting provider [place] or covering provider during after hours [place], for this patient?  1. Check the care team in [facility] and look for a) attending/consulting [facility] provider listed and b) the [facility] team listed 2. Log into [email or web address] and use [place]'s universal password to access. If you do not have the password, please contact the hospital operator. 3. Locate the [facility] provider you are looking for under [facility] and page to a number that you can be directly reached. 4. If you still have difficulty reaching the provider, please page the [facility] (Director on Call) for the Hospitalists listed on amion for assistance.  06/26/2019,  11:31 AM

## 2019-06-26 NOTE — Consult Note (Addendum)
Cardiology Consultation:   Patient ID: Whitney [name] MRN: [id number]; DOB: 1955-12-04  Admit date: 06/25/2019 Date of Consult: 06/26/2019  Primary Care Provider: [name], MD Primary Cardiologist: [name], MD  Primary Electrophysiologist:  None    Patient Profile:   Whitney [name] is a 63 y.o. female with a history of normal coronaries on cath in 2006, PEA arrest in 3154, chronic systolic CHF with EF of 00-86% in 2013, hypertension, hyperlipidemia, diabetes mellitus on insulin, obstructive sleep apnea not on CPAP, and morbid obesity who is being seen today for the evaluation of chest pain at the request of Dr. Eliseo Squires (Internal Medicine).  History of Present Illness:   Whitney [name] is a 63 year old female with the above history. Patient had normal coronaries on remote cath in 2006. She had a witnessed PEA arrest in 11/2011 after presenting to the ED with shortness of breath for PE rule out. CPR was performed and ROSC was achieved after about 10 minutes. Echo at that time showed LVEF of 30-35%. Repeat Echo in 12/2011 showed LVEF of 40-45% with grade 2 diastolic dysfunction, mild MR, and mild calcification of aortic valve. Patient was most recently seen by Dr. Marlou Porch in 11/2015 at which time she reported some exertional chest pain and baseline shorntess of breath. EKG at that visit showed T waves in inferolateral leads. 2-day stress test was ordered and showed a small defect in the basal inferolateral and mid inferolateral location consistent with prior MI but was considered low risk.  Patient presented to the ED yesterday via EMS for evaluation of chest pain.  Patient reports intermittent chest discomfort that sometimes radiates up both sides of her neck.  She has a hard time describing the pain but eventually states it feels tight like she has a ball on.  Discomfort lasts about a minute and then resolves on its own.  It occurs at rest and with minimal activity such as lifting her head up from  the bed. Also occurs when she is straining to have a bowel movement. Patient is very sedentary and does not do much more than simply walk around the house.  Patient states she first noticed the chest discomfort when her PCP took her off Amlodipine about 2 weeks ago. She notes occasional shortness of breath but nothing worse from her baseline.  She denies any associated diaphoresis, nausea, vomiting.  She also reports her PCP told her she is gained about 20 pounds in the last month.  She notes chronic lower extremity swelling and stable orthopnea. She states she has slept on an incline for a while now. She also notes occasional PND. However, she does have sleep apnea but has not used her CPAP machine in a couple of years.  She also notes some fluttering in her chest but denies any lightheadedness, dizziness, syncope.  No recent falls.  No abnormal bleeding including hematochezia, melena, hematuria.  In the ED, patient hypertensive but vital stable. EKG showed normal sinus rhythm with mild T wave inversions in leads II and III (no acute changes from prior tracings). High-sensitivity troponin negative x3. BNP normal. Chest x-ray showed markedly cardiomegaly but no acute findings. WBC 8.7, Hgb 10.8, Plts 254. Na 141, K 3.2, Glucose 141, Scr 0.64. COVID-19 testing negative.   At the time of this evaluation, patient complains on and off of very minimal chest tightness.  Patient denies any history of tobacco use.  No alcohol use or recreational drug use.  He does have a family  history of heart disease with both her mother and her her maternal grandmother dying in their sleep in their 70s to 61s.  Her grandmother had known CHF. Marland Kitchen Heart Pathway Score:     Past Medical History:  Diagnosis Date   Arthritis    left knee,right hip   Cardiac arrest ([place])    Depression    Diabetes mellitus    GERD (gastroesophageal reflux disease)    Glaucoma    H/O cardiac arrest 11/2011   PEA   Heart murmur     Hyperlipidemia    Hypertension    Myocardial infarction [facility]) 2013   Sleep apnea    Bring machine,mask and tubing    Past Surgical History:  Procedure Laterality Date   BACK SURGERY     COLONOSCOPY N/A 09/12/2016   Procedure: COLONOSCOPY;  Surgeon: Clarene Essex, MD;  Location: [facility];  Service: Endoscopy;  Laterality: N/A;   CYSTOSCOPY W/ URETERAL STENT PLACEMENT Left 01/04/2013   Procedure: CYSTOSCOPY WITH RETROGRADE PYELOGRAM/URETERAL STENT PLACEMENT;  Surgeon: Bernestine Amass, MD;  Location: [facility] OR[facility];  Service: Urology;  Laterality: Left;   CYSTOSCOPY WITH RETROGRADE PYELOGRAM, URETEROSCOPY AND STENT PLACEMENT Left 01/26/2013   Procedure: CYSTOSCOPY, JJ STENT REMOVAL, LEFT URETEROSCOPY, ;  Surgeon: Bernestine Amass, MD;  Location: [facility];  Service: Urology;  Laterality: Left;  CYSTO, JJ STENT REMOVAL, LEFT URETEROSCOPY    FOOT SURGERY     HERNIA REPAIR     HOLMIUM LASER APPLICATION Left 1/61/0960   Procedure: HOLMIUM LASER LITHOTRIPSY ;  Surgeon: Bernestine Amass, MD;  Location: [facility];  Service: Urology;  Laterality: Left;   KNEE SURGERY     left   PARTIAL HYSTERECTOMY       Home Medications:  Prior to Admission medications   Medication Sig Start Date End Date Taking? Authorizing Provider  aspirin 325 MG tablet Take 1 tablet (325 mg total) by mouth at bedtime. 07/15/17  Yes Arrien, Jimmy Picket, MD  Chlorphen-Phenyleph-APAP (CORICIDIN D COLD/FLU/SINUS) 2-5-325 MG TABS Take 1 tablet by mouth every 6 (six) hours as needed (for congestion or cold-like symptoms).    Yes [provider]  clobetasol ointment (TEMOVATE) 4.54 % Apply 1 application topically 2 (two) times daily as needed (as directed- avoid open wounds).  07/08/18  Yes [provider]  cyclobenzaprine (FLEXERIL) 10 MG tablet Take 10 mg by mouth 2 (two) times daily.  03/12/19  Yes [provider]  docusate sodium (COLACE) 100 MG capsule Take 200 mg by mouth 2 (two) times daily as needed  for mild constipation or moderate constipation.   Yes [provider]  furosemide (LASIX) 20 MG tablet Take 1 tablet (20 mg total) by mouth daily. 08/29/17  Yes Medina-Vargas, Monina C, NP  gabapentin (NEURONTIN) 300 MG capsule Take 300 mg by mouth See admin instructions. Take 300 mg by mouth two times a day and an additional 300 mg once daily as needed for nerve pain 03/11/19  Yes [provider]  JARDIANCE 25 MG TABS tablet Take 25 mg by mouth daily. 06/03/19  Yes [provider]  lisinopril (PRINIVIL,ZESTRIL) 20 MG tablet Take 1 tablet (20 mg total) by mouth daily before breakfast. 08/29/17  Yes Medina-Vargas, Monina C, NP  metoprolol succinate (TOPROL-XL) 100 MG 24 hr tablet Take 1 tablet (100 mg total) by mouth daily before breakfast. Take with or immediately following a meal. 08/29/17  Yes Medina-Vargas, Monina C, NP  NOVOLIN N 100 UNIT/ML injection Inject 15 Units into the  skin 2 (two) times daily after a meal.  08/08/18  Yes [provider]  oxybutynin (DITROPAN) 5 MG tablet Take 1 tablet (5 mg total) by mouth daily. 08/29/17  Yes Medina-Vargas, Monina C, NP  pantoprazole (PROTONIX) 40 MG tablet Take 1 tablet (40 mg total) by mouth daily. 08/29/17  Yes Medina-Vargas, Monina C, NP  pravastatin (PRAVACHOL) 40 MG tablet Take 1 tablet (40 mg total) by mouth at bedtime. 08/29/17  Yes Medina-Vargas, Monina C, NP  traMADol (ULTRAM) 50 MG tablet Take 2 tablets (100 mg total) by mouth every 8 (eight) hours as needed for severe pain. 08/29/17  Yes Medina-Vargas, Monina C, NP  amLODipine (NORVASC) 5 MG tablet Take 1 tablet (5 mg total) by mouth daily. Patient not taking: Reported on 06/25/2019 08/29/17   Medina-Vargas, Jaymes Graff C, NP  BD INSULIN SYRINGE U/F 31G X 5/16" 0.5 ML MISC as directed.  03/02/19   [provider]  betamethasone dipropionate (DIPROLENE) 0.05 % cream Apply 1 application topically 2 (two) times daily. Patient not taking: Reported on 06/25/2019  08/29/17   Medina-Vargas, Monina C, NP  bimatoprost (LUMIGAN) 0.01 % SOLN Place 1 drop into both eyes at bedtime. Patient not taking: Reported on 06/25/2019 08/29/17   Medina-Vargas, Monina C, NP  blood glucose meter kit and supplies KIT Dispense based on patient and insurance preference. Use up to four times daily as directed. (FOR ICD-9 250.00, 250.01). 09/02/17   Medina-Vargas, Monina C, NP  HYDROcodone-acetaminophen (NORCO/VICODIN) 5-325 MG tablet Take 1 tablet by mouth every 6 (six) hours as needed for moderate pain or severe pain. Patient not taking: Reported on 06/25/2019 08/29/17   Medina-Vargas, Monina C, NP  insulin aspart (NOVOLOG) 100 UNIT/ML FlexPen Inject 3 Units into the skin See admin instructions. Before lunch and dinner. Patient not taking: Reported on 06/25/2019 08/29/17   Medina-Vargas, Monina C, NP  Insulin Glargine (LANTUS SOLOSTAR) 100 UNIT/ML Solostar Pen Inject 24 Units into the skin at bedtime. Patient not taking: Reported on 06/25/2019 08/29/17   Medina-Vargas, Monina C, NP  ONGLYZA 5 MG TABS tablet Take 1 tablet (5 mg total) by mouth daily. Patient not taking: Reported on 06/25/2019 08/29/17   Medina-Vargas, Monina C, NP  potassium chloride (K-DUR) 10 MEQ tablet Take 1 tablet (10 mEq total) by mouth daily. Patient not taking: Reported on 06/25/2019 08/29/17   Medina-Vargas, Senaida Lange, NP    Inpatient Medications: Scheduled Meds:  aspirin EC  325 mg Oral QHS   cyclobenzaprine  10 mg Oral BID   enoxaparin (LOVENOX) injection  40 mg Subcutaneous Q24H   furosemide  20 mg Oral Daily   gabapentin  300 mg Oral BID   [START ON 06/27/2019] influenza vaccine adjuvanted  0.5 mL Intramuscular Tomorrow-1000   insulin aspart  0-5 Units Subcutaneous QHS   insulin aspart  0-9 Units Subcutaneous TID WC   insulin glargine  10 Units Subcutaneous QHS   lisinopril  20 mg Oral Daily   metoprolol succinate  100 mg Oral Daily   pravastatin  40 mg Oral QHS   sodium chloride flush   3 mL Intravenous Q12H   Continuous Infusions:  sodium chloride     PRN Meds: sodium chloride, acetaminophen, hydrALAZINE, nitroGLYCERIN, ondansetron (ZOFRAN) IV, sodium chloride flush  Allergies:    Allergies  Allergen Reactions   Contrast Media [Iodinated Diagnostic Agents] Anaphylaxis    Social History:   Social History   Socioeconomic History   Marital status: Divorced    Spouse name: Not on file  Number of children: Not on file   Years of education: Not on file   Highest education level: Not on file  Occupational History   Not on file  Social Needs   Financial resource strain: Not on file   Food insecurity    Worry: Not on file    Inability: Not on file   Transportation needs    Medical: Not on file    Non-medical: Not on file  Tobacco Use   Smoking status: Never Smoker   Smokeless tobacco: Never Used  Substance and Sexual Activity   Alcohol use: No   Drug use: No   Sexual activity: Never  Lifestyle   Physical activity    Days per week: Not on file    Minutes per session: Not on file   Stress: Not on file  Relationships   Social connections    Talks on phone: Not on file    Gets together: Not on file    Attends religious service: Not on file    Active member of club or organization: Not on file    Attends meetings of clubs or organizations: Not on file    Relationship status: Not on file   Intimate partner violence    Fear of current or ex partner: Not on file    Emotionally abused: Not on file    Physically abused: Not on file    Forced sexual activity: Not on file  Other Topics Concern   Not on file  Social History Narrative   Not on file    Family History:    Family History  Problem Relation Age of Onset   Asthma Mother      ROS:  Please see the history of present illness.  All other ROS reviewed and negative.     Physical Exam/Data:   Vitals:   06/26/19 0448 06/26/19 0736 06/26/19 1025 06/26/19 1126  BP:  138/75 (!) 144/71  (!) 157/81  Pulse: 71 73 64 64  Resp: '16 18  18  ' Temp: 98.5 F (36.9 C) 98.3 F (36.8 C)  98.4 F (36.9 C)  TempSrc: Oral Oral  Oral  SpO2: 96% 96%  96%  Weight:      Height:        Intake/Output Summary (Last 24 hours) at 06/26/2019 1615 Last data filed at 06/26/2019 1443 Gross per 24 hour  Intake 777 ml  Output 1475 ml  Net -698 ml   Last 3 Weights 06/26/2019 06/25/2019 08/29/2017  Weight (lbs) 303 lb 9.2 oz 305 lb 5.4 oz 297 lb 9.6 oz  Weight (kg) 137.7 kg 138.5 kg 134.99 kg     Body mass index is 53.78 kg/m.  General: 63 y.o. morbidly obese African-American female resting comfortably in no acute distress. HEENT: Normocephalic and atraumatic. Sclera clear.  Neck: Supple. Difficult to assess JVD due to body habitus. Heart: RRR. Distinct S1 and S2. II/VI murmur noted. No gallops or rubs. Radial pulses 2+ and equal bilaterally. Lungs: No increased work of breathing. Clear to ausculation bilaterally. No wheezes, rhonchi, or rales.  Abdomen: Soft, non-distended, and non-tender to palpation. Bowel sounds present in all 4 quadrants.  MSK: Generalized weakness/deconditioning. Extremities: Difficult to assess edema due to body habitus. Skin: Warm and dry. Chronic lower extremity hyperpigmentation/skin changes due to venous stasis. Neuro: Alert and oriented x3. No focal deficits. Psych: Normal affect. Responds appropriately.   EKG:  The EKG was personally reviewed and demonstrates: Normal sinus rhythm, rate 71 bpm, with LVH and  T wave inversions in leads II and III (seen on prior tracings). Telemetry:  Telemetry was personally reviewed and demonstrates:  Normal sinus rhythm with rates in the 60's to 90's.   Relevant CV Studies:  Echocardiogram 06/26/2019: 1. Left ventricular ejection fraction, by visual estimation, is 55 to 60%. The left ventricle has normal function. Moderately increased left ventricular size. There is no left ventricular hypertrophy.  2.  Definity contrast agent was given IV to delineate the left ventricular endocardial borders.  3. Elevated mean left atrial pressure.  4. Left ventricular diastolic Doppler parameters are consistent with impaired relaxation pattern of LV diastolic filling.  5. Global right ventricle has normal systolic function.The right ventricular size is normal. No increase in right ventricular wall thickness.  6. The mitral valve is normal in structure. Mild mitral valve regurgitation. _______________  Carlton Adam Myoview 12/27/2015:  Nuclear stress EF: 60%.  There was no ST segment deviation noted during stress.  Defect 1: There is a small defect of moderate severity present in the basal inferolateral and mid inferolateral location.  Findings consistent with prior myocardial infarction.  This is a low risk study.  The left ventricular ejection fraction is normal (55-65%).  Laboratory Data:  High Sensitivity Troponin:   Recent Labs  Lab 06/25/19 1738 06/25/19 1930 06/25/19 2212  TROPONINIHS '12 16 17     ' Chemistry Recent Labs  Lab 06/25/19 1738 06/26/19 0418  NA 141 141  K 3.2* 3.2*  CL 106 109  CO2 25 25  GLUCOSE 141* 215*  BUN 10 10  CREATININE 0.64 0.56  CALCIUM 8.7* 8.5*  GFRNONAA >60 >60  GFRAA >60 >60  ANIONGAP 10 7    Recent Labs  Lab 06/26/19 0418  PROT 6.4*  ALBUMIN 3.0*  AST 11*  ALT 11  ALKPHOS 91  BILITOT 1.2   Hematology Recent Labs  Lab 06/25/19 1738 06/26/19 0418  WBC 8.7 7.3  RBC 4.23 4.13  HGB 10.8* 10.8*  HCT 36.6 35.1*  MCV 86.5 85.0  MCH 25.5* 26.2  MCHC 29.5* 30.8  RDW 15.0 15.2  PLT 254 249   BNP Recent Labs  Lab 06/25/19 1738  BNP 65.6    DDimer No results for input(s): DDIMER in the last 168 hours.   Radiology/Studies:  Dg Chest Portable 1 View  Result Date: 06/25/2019 CLINICAL DATA:  Chest pressure, shortness of breath. EXAM: PORTABLE CHEST 1 VIEW COMPARISON:  07/28/2017 FINDINGS: No signs of dense consolidation or pleural  effusion. Heart size remains enlarged with ectatic thoracic aorta. Healed right humeral fracture, no acute bone finding. IMPRESSION: No signs of acute cardiopulmonary disease with marked cardiomegaly. Electronically Signed   By: Zetta Bills M.D.   On: 06/25/2019 17:28    Assessment and Plan:   Chest Pain - Patient present with intermittent chest pain that last for about one minute at a time and then resolves independently. Occurs at rest and with minimal activity as well as when straining to have a bowel movement. She has history of normal coronaries on remote cath in 2006. She had a PEA arrest in 2013 felt to be due to respiratory failure. No cath at that time. She had a low risk Myoview in 2017. - EKG shows mild T waves in inferior leads but no acute changes from prior tracings. - High-sensitivity troponin negative x3. - Presentation atypical. However, cardiovascular risk factors include hypertension, diabetes, morbid obesity, sedentary lifestyle. Will discuss whether any further work-up is needed with MD. Given body habitus, I do  not think patient would be a good candidate for Myoview. Could consider outpatient coronary CT if symptoms persisted.  Chronic Systolic CHF - Chest x-ray showed cardiomegaly but no edema. - BNP normal.  - Echo this admission shows improved LVEF of 55-60% with grade 1 diastolic dysfunction and elevated mean left atrial pressures. EF was 40-45% on last Echo in 2013.  - Very difficult to assess volume status due to body habitus. - Currently on home PO Lasix 34m daily. Documented output of 1.2 L so far today. Weight down about 2 lbs from yesterday. Continue for now. - Continue home Lisinopril and Toprol.  - Continue to monitor daily weight, strict I/O's, and renal function.  History of PEA Arrest in 2013 - Patient had witnessed PEA arrest in 2013 after presenting to the ED for further evaluation of shortness of breath. Echo at that time showed LVEF of 30-35%. No cath  was performed at that time and EF eventually improved.   Hypertension - Most recent BP 157/81. Systolic BP has been as high as 190 in the ED.  - Currently on Lisinopril 20 mg daily and Toprol 100 mg daily. Previously on Amlodipine but this was stopped due to edema.  - Recommend increasing Lisinopril to 40 mg daily.  Diabetes Mellitus - Hemoglobin A1c 7.5 this admission. - Management per primary team.  Hypokalemia - Potassium low at 3.2. Supplemented with K-Dur. - Continue to monitor.  Obstructive Sleep Apnea - Patient has a history of sleep apnea but states she has not used her CPAP machine in the past couple of years.  - Patient would benefit from repeat sleep evaluation.  Morbid Obesity - I suspect this is the crux of all her problems.     For questions or updates, please contact CValentinePlease consult [email or web address] for contact info under     Signed, CDarreld Mclean PA-C  06/26/2019 4:15 PM   I have examined the patient and reviewed assessment and plan and discussed with patient.  Agree with above as stated.    Atypical chest pain with negative HS troponin.  SHe has felt worse since amlodipine was stopped for edema.  I don't think edema in legs is from amlodipine.  I think this is from venous insufficiency.  Skin changes on the legs and wound in the past would be more consistent with venous insufficiency.    Increase lisinopril for now.  If BP still increased, would add back amlodipine 5 mg daily.  No plan for ischemia w/u.  If symptoms persist, could consider outpatient cardiac CT of the coronaries.   JLarae Grooms

## 2019-06-26 NOTE — Progress Notes (Signed)
RT offered pt CPAP dream station for the night and pt declined at this time. Pt states there is no need for me to start wearing it here at the hospital when I have not worn my home one for a while d/t being broken. RT told pt to call if she changes her mind. RT will continue to monitor.

## 2019-06-27 DIAGNOSIS — I872 Venous insufficiency (chronic) (peripheral): Secondary | ICD-10-CM | POA: Diagnosis not present

## 2019-06-27 DIAGNOSIS — E1165 Type 2 diabetes mellitus with hyperglycemia: Secondary | ICD-10-CM | POA: Diagnosis not present

## 2019-06-27 DIAGNOSIS — R079 Chest pain, unspecified: Secondary | ICD-10-CM | POA: Diagnosis not present

## 2019-06-27 DIAGNOSIS — R0789 Other chest pain: Secondary | ICD-10-CM | POA: Diagnosis not present

## 2019-06-27 DIAGNOSIS — Z6841 Body Mass Index (BMI) 40.0 and over, adult: Secondary | ICD-10-CM | POA: Diagnosis not present

## 2019-06-27 DIAGNOSIS — E876 Hypokalemia: Secondary | ICD-10-CM | POA: Diagnosis not present

## 2019-06-27 DIAGNOSIS — Z23 Encounter for immunization: Secondary | ICD-10-CM | POA: Diagnosis not present

## 2019-06-27 DIAGNOSIS — G4733 Obstructive sleep apnea (adult) (pediatric): Secondary | ICD-10-CM | POA: Diagnosis not present

## 2019-06-27 DIAGNOSIS — I10 Essential (primary) hypertension: Secondary | ICD-10-CM | POA: Diagnosis not present

## 2019-06-27 DIAGNOSIS — Z20828 Contact with and (suspected) exposure to other viral communicable diseases: Secondary | ICD-10-CM | POA: Diagnosis not present

## 2019-06-27 DIAGNOSIS — I2510 Atherosclerotic heart disease of native coronary artery without angina pectoris: Secondary | ICD-10-CM | POA: Diagnosis not present

## 2019-06-27 DIAGNOSIS — Z794 Long term (current) use of insulin: Secondary | ICD-10-CM | POA: Diagnosis not present

## 2019-06-27 DIAGNOSIS — R0782 Intercostal pain: Secondary | ICD-10-CM | POA: Diagnosis not present

## 2019-06-27 LAB — GLUCOSE, CAPILLARY: Glucose-Capillary: 173 mg/dL — ABNORMAL HIGH (ref 70–99)

## 2019-06-27 MED ORDER — AMLODIPINE BESYLATE 5 MG PO TABS: 5.0000 mg | ORAL_TABLET | Freq: Every day | ORAL | Status: DC

## 2019-06-27 MED ORDER — LISINOPRIL 40 MG PO TABS: 40.0000 mg | ORAL_TABLET | Freq: Every day | ORAL | 0 refills | Status: DC

## 2019-06-27 MED ORDER — AMLODIPINE BESYLATE 5 MG PO TABS: 5.0000 mg | ORAL_TABLET | Freq: Every day | ORAL | 0 refills | Status: DC

## 2019-06-27 NOTE — Plan of Care (Signed)

## 2019-06-27 NOTE — Discharge Summary (Signed)
Physician Discharge Summary  Whitney [name]:[id number] DOB: 04-Mar-1956 DOA: 06/25/2019  PCP: Whitney Amel, MD  Admit date: 06/25/2019 Discharge date: 06/27/2019  Admitted From: home Discharge disposition: home   Recommendations for Outpatient Follow-Up:   1. Titration of BP medications- increased lisinopril and added norvasc per cardiology recommendations  2. Needs weight loss- consider referral to a medical weight loss center such as [facility] or [facility] 3. Declined home health 4. BMP 1 week-- resume home Kdur that patient was not taking   Discharge Diagnosis:   Principal Problem:   Chest pain Active Problems:   Diabetes mellitus type 2, uncontrolled (HCC)   Hypokalemia   History of cardiomyopathy    Discharge Condition: Improved.  Diet recommendation: Low sodium, heart healthy.  Carbohydrate-modified.  Wound care: None.  Code status: Full.   History of Present Illness:   Whitney [name] is a 63 y.o. female with medical history significant for coronary artery disease, history of cardiomyopathy that subsequently resolved, insulin-dependent diabetes mellitus, hypertension, and OSA on CPAP, presented to emergency department for evaluation of shortness of breath and chest discomfort.  Patient reports intermittent chest discomfort and dyspnea over the past few days, and also reported a 20 lb wt gain in the past month.  She describes the chest discomfort as a tightness, localized to the central chest, associated with a fluttering sensation, and shortness of breath.  Episodes have been self-limited, resolving within a couple minutes.  These have been occurring at rest while in bed, possibly worse with certain movements such as sitting up in bed.  They have also occurred with activity, though the patient is fairly sedentary.  She denies any recent shortness of breath, fevers, chills, or sick contacts.  ED Course: Upon arrival to the ED, patient is found to be afebrile,  saturating well on room air, slightly tachypneic, and with blood pressure 190/80.  EKG features a sinus rhythm with rate 56 and LVH.  Chest x-ray is notable for marked cardiomegaly without any acute cardiopulmonary disease.  Chemistry panel notable for potassium of 3.2.  Cardiology was contacted by the ED physician and recommended observation on the medical service for repeat troponin, echocardiogram, and formal consultation with cardiology in the morning   Hospital Course by Problem:   Atypical chest pain with negative HS troponin.  -has felt worse since amlodipine was stopped for edema.  -per cards: don't think edema in legs is from amlodipine.  I think this is from venous insufficiency.  Skin changes on the legs and wound in the past would be more consistent with venous insufficiency.    HTN Increase lisinopril to 40 mg and add back the norvasc as BP still elevated.   Insulin-dependent DM -resume home meds  Hypokalemia -Replace  Morbid obesity Body mass index is 53.78 kg/m. -referral to medical weight loss center   Medical Consultants:   cards   Discharge Exam:   Vitals:   06/27/19 0744 06/27/19 0950  BP: (!) 151/75 (!) 159/70  Pulse: 82 66  Resp: 18   Temp: 98.7 F (37.1 C) 98.3 F (36.8 C)  SpO2: 93% 95%   Vitals:   06/26/19 2313 06/27/19 0428 06/27/19 0744 06/27/19 0950  BP: (!) 149/59 132/75 (!) 151/75 (!) 159/70  Pulse: 73 69 82 66  Resp: '16 17 18   ' Temp: 98.3 F (36.8 C) 98.4 F (36.9 C) 98.7 F (37.1 C) 98.3 F (36.8 C)  TempSrc: Oral Oral Oral Oral  SpO2: 94% 95%  93% 95%  Weight:  135.2 kg    Height:        General exam: Appears calm and comfortable.   The results of significant diagnostics from this hospitalization (including imaging, microbiology, ancillary and laboratory) are listed below for reference.     Procedures and Diagnostic Studies:   Dg Chest Portable 1 View  Result Date: 06/25/2019 CLINICAL DATA:  Chest pressure,  shortness of breath. EXAM: PORTABLE CHEST 1 VIEW COMPARISON:  07/28/2017 FINDINGS: No signs of dense consolidation or pleural effusion. Heart size remains enlarged with ectatic thoracic aorta. Healed right humeral fracture, no acute bone finding. IMPRESSION: No signs of acute cardiopulmonary disease with marked cardiomegaly. Electronically Signed   By: Zetta Bills M.D.   On: 06/25/2019 17:28     Labs:   Basic Metabolic Panel: Recent Labs  Lab 06/25/19 1738 06/26/19 0418  NA 141 141  K 3.2* 3.2*  CL 106 109  CO2 25 25  GLUCOSE 141* 215*  BUN 10 10  CREATININE 0.64 0.56  CALCIUM 8.7* 8.5*  MG  --  1.9   GFR Estimated Creatinine Clearance: 94.6 mL/min (by C-G formula based on SCr of 0.56 mg/dL). Liver Function Tests: Recent Labs  Lab 06/26/19 0418  AST 11*  ALT 11  ALKPHOS 91  BILITOT 1.2  PROT 6.4*  ALBUMIN 3.0*   No results for input(s): LIPASE, AMYLASE in the last 168 hours. No results for input(s): AMMONIA in the last 168 hours. Coagulation profile No results for input(s): INR, PROTIME in the last 168 hours.  CBC: Recent Labs  Lab 06/25/19 1738 06/26/19 0418  WBC 8.7 7.3  NEUTROABS 6.0  --   HGB 10.8* 10.8*  HCT 36.6 35.1*  MCV 86.5 85.0  PLT 254 249   Cardiac Enzymes: No results for input(s): CKTOTAL, CKMB, CKMBINDEX, TROPONINI in the last 168 hours. BNP: Invalid input(s): POCBNP CBG: Recent Labs  Lab 06/26/19 0526 06/26/19 1128 06/26/19 1648 06/26/19 2119 06/27/19 0626  GLUCAP 177* 143* 161* 224* 173*   D-Dimer No results for input(s): DDIMER in the last 72 hours. Hgb A1c Recent Labs    06/26/19 0418  HGBA1C 7.5*   Lipid Profile No results for input(s): CHOL, HDL, LDLCALC, TRIG, CHOLHDL, LDLDIRECT in the last 72 hours. Thyroid function studies No results for input(s): TSH, T4TOTAL, T3FREE, THYROIDAB in the last 72 hours.  Invalid input(s): FREET3 Anemia work up No results for input(s): VITAMINB12, FOLATE, FERRITIN, TIBC, IRON,  RETICCTPCT in the last 72 hours. Microbiology Recent Results (from the past 240 hour(s))  SARS CORONAVIRUS 2 (TAT 6-24 HRS) Nasopharyngeal Nasopharyngeal Swab     Status: None   Collection Time: 06/25/19  8:21 PM   Specimen: Nasopharyngeal Swab  Result Value Ref Range Status   SARS Coronavirus 2 NEGATIVE NEGATIVE Final    Comment: (NOTE) SARS-CoV-2 target nucleic acids are NOT DETECTED. The SARS-CoV-2 RNA is generally detectable in upper and lower respiratory specimens during the acute phase of infection. Negative results do not preclude SARS-CoV-2 infection, do not rule out co-infections with other pathogens, and should not be used as the sole basis for treatment or other patient management decisions. Negative results must be combined with clinical observations, patient history, and epidemiological information. The expected result is Negative. Fact Sheet for Patients: SugarRoll.be Fact Sheet for Healthcare Providers: https://www.woods-mathews.com/ This test is not yet approved or cleared by the [place] FDA and  has been authorized for detection and/or diagnosis of SARS-CoV-2 by FDA under an Emergency Use Authorization (  EUA). This EUA will remain  in effect (meaning this test can be used) for the duration of the COVID-19 declaration under Section 56 4(b)(1) of the Act, 21 U.S.C. section 360bbb-3(b)(1), unless the authorization is terminated or revoked sooner. Performed at [facility], [place] [address] [place], [place] [address]      Discharge Instructions:   Discharge Instructions    Diet - low sodium heart healthy   Complete by: As directed    Diet Carb Modified   Complete by: As directed    Increase activity slowly   Complete by: As directed      Allergies as of 06/27/2019      Reactions   Contrast Media [iodinated Diagnostic Agents] Anaphylaxis      Medication List    STOP taking these medications    betamethasone dipropionate 0.05 % cream Commonly known as: DIPROLENE   bimatoprost 0.01 % Soln Commonly known as: Lumigan   HYDROcodone-acetaminophen 5-325 MG tablet Commonly known as: NORCO/VICODIN   insulin aspart 100 UNIT/ML FlexPen Commonly known as: NOVOLOG   Insulin Glargine 100 UNIT/ML Solostar Pen Commonly known as: Lantus SoloStar   Onglyza 5 MG Tabs tablet Generic drug: saxagliptin HCl     TAKE these medications   amLODipine 5 MG tablet Commonly known as: NORVASC Take 1 tablet (5 mg total) by mouth daily.   aspirin 325 MG tablet Take 1 tablet (325 mg total) by mouth at bedtime.   BD Insulin Syringe U/F 31G X 5/16" 0.5 ML Misc Generic drug: Insulin Syringe-Needle U-100 as directed.   blood glucose meter kit and supplies Kit Dispense based on patient and insurance preference. Use up to four times daily as directed. (FOR ICD-9 250.00, 250.01).   clobetasol ointment 0.05 % Commonly known as: TEMOVATE Apply 1 application topically 2 (two) times daily as needed (as directed- avoid open wounds).   Coricidin D Cold/Flu/Sinus 2-5-325 MG Tabs Generic drug: Chlorphen-Phenyleph-APAP Take 1 tablet by mouth every 6 (six) hours as needed (for congestion or cold-like symptoms).   cyclobenzaprine 10 MG tablet Commonly known as: FLEXERIL Take 10 mg by mouth 2 (two) times daily.   docusate sodium 100 MG capsule Commonly known as: COLACE Take 200 mg by mouth 2 (two) times daily as needed for mild constipation or moderate constipation.   furosemide 20 MG tablet Commonly known as: LASIX Take 1 tablet (20 mg total) by mouth daily.   gabapentin 300 MG capsule Commonly known as: NEURONTIN Take 300 mg by mouth See admin instructions. Take 300 mg by mouth two times a day and an additional 300 mg once daily as needed for nerve pain   Jardiance 25 MG Tabs tablet Generic drug: empagliflozin Take 25 mg by mouth daily.   lisinopril 40 MG tablet Commonly known as: ZESTRIL  Take 1 tablet (40 mg total) by mouth daily. Start taking on: June 28, 2019 What changed:   medication strength  how much to take  when to take this   metoprolol succinate 100 MG 24 hr tablet Commonly known as: TOPROL-XL Take 1 tablet (100 mg total) by mouth daily before breakfast. Take with or immediately following a meal.   NovoLIN N 100 UNIT/ML injection Generic drug: insulin NPH Human Inject 15 Units into the skin 2 (two) times daily after a meal.   oxybutynin 5 MG tablet Commonly known as: DITROPAN Take 1 tablet (5 mg total) by mouth daily.   pantoprazole 40 MG tablet Commonly known as: PROTONIX Take 1 tablet (40  mg total) by mouth daily.   potassium chloride 10 MEQ tablet Commonly known as: K-DUR Take 1 tablet (10 mEq total) by mouth daily.   pravastatin 40 MG tablet Commonly known as: PRAVACHOL Take 1 tablet (40 mg total) by mouth at bedtime.   traMADol 50 MG tablet Commonly known as: ULTRAM Take 2 tablets (100 mg total) by mouth every 8 (eight) hours as needed for severe pain.      Follow-up Information    Koirala, Dibas, MD Follow up.   Specialty: Family Medicine Why: for BP check Contact information: [place] [address] [phone]            Time coordinating discharge: 25 min  Signed:  Geradine Girt DO  [facility] 06/27/2019, 10:03 AM

## 2019-06-27 NOTE — Progress Notes (Signed)
Patient discharge and medication education given with teach back. Questions and concern answered. Peripheral iv removed, dressing applied.  Pt was transported by nursing student in wheelchair with belongings: clothes, shoes, and glasses.

## 2019-06-27 NOTE — TOC Progression Note (Signed)
Transition of Care [facility]) - Progression Note    Patient Details  Name: Whitney [name] MRN: [id number] Date of Birth: [birth year]  [facility] of [facility]) CM/SW Contact  Zenon Mayo, RN Phone Number: 06/27/2019, 12:44 PM  Clinical Narrative:     Patient for dc today, she does not want any one coming to her house , does not want HH.  She has insurance to get medications.       Expected Discharge Plan and Services           Expected Discharge Date: 06/27/19                                     Social Determinants of Health (SDOH) Interventions    Readmission Risk Interventions No flowsheet data found.

## 2019-06-27 NOTE — Progress Notes (Signed)
Progress Note  Patient Name: Whitney [name] Date of Encounter: 06/27/2019  Primary Cardiologist: [name], MD   Patient Profile     Whitney [name] is a 63 y.o. female with a history of normal coronaries on cath in 2006, PEA arrest in 0000000, chronic systolic CHF with EF of [id number] in 2013, hypertension, hyperlipidemia, diabetes mellitus on insulin, obstructive sleep apnea not on CPAP, and morbid obesity seen 9/25  for the evaluation of chest pain Hypertension/DM  HsTn neg x 3; BNP normal  Echo 9/20>> LVEF 55-60%  Subjective   Still with some chest pains  Inpatient Medications    Scheduled Meds: . amLODipine  5 mg Oral Daily  . aspirin EC  325 mg Oral QHS  . cyclobenzaprine  10 mg Oral BID  . enoxaparin (LOVENOX) injection  40 mg Subcutaneous Q24H  . furosemide  20 mg Oral Daily  . gabapentin  300 mg Oral BID  . insulin aspart  0-5 Units Subcutaneous QHS  . insulin aspart  0-9 Units Subcutaneous TID WC  . insulin glargine  15 Units Subcutaneous QHS  . lisinopril  40 mg Oral Daily  . metoprolol succinate  100 mg Oral Daily  . pravastatin  40 mg Oral QHS  . sodium chloride flush  3 mL Intravenous Q12H   Continuous Infusions: . sodium chloride     PRN Meds: sodium chloride, acetaminophen, hydrALAZINE, nitroGLYCERIN, ondansetron (ZOFRAN) IV, sodium chloride flush   Vital Signs    Vitals:   06/26/19 2313 06/27/19 0428 06/27/19 0744 06/27/19 0950  BP: (!) 149/59 132/75 (!) 151/75 (!) 159/70  Pulse: 73 69 82 66  Resp: 16 17 18    Temp: 98.3 F (36.8 C) 98.4 F (36.9 C) 98.7 F (37.1 C) 98.3 F (36.8 C)  TempSrc: Oral Oral Oral Oral  SpO2: 94% 95% 93% 95%  Weight:  135.2 kg    Height:        Intake/Output Summary (Last 24 hours) at 06/27/2019 1015 Last data filed at 06/27/2019 0956 Gross per 24 hour  Intake 1314 ml  Output 3300 ml  Net -1986 ml   Last 3 Weights 06/27/2019 06/26/2019 06/25/2019  Weight (lbs) 298 lb 1 oz 303 lb 9.2 oz 305 lb 5.4 oz  Weight (kg)  135.2 kg 137.7 kg 138.5 kg      Telemetry    Sinus  - Personally Reviewed  ECG       Physical Exam  Well developed and morbidly obese in no acute distress HENT normal Neck supple with JVP-flat Clear Regular rate and rhythm, no murmurs or gallops Abd-soft with active BS No Clubbing cyanosis edema Skin-warm and dry A & Oriented  Grossly normal sensory and motor function   Labs    High Sensitivity Troponin:   Recent Labs  Lab 06/25/19 1738 06/25/19 1930 06/25/19 2212  TROPONINIHS 12 16 17       Chemistry Recent Labs  Lab 06/25/19 1738 06/26/19 0418  NA 141 141  K 3.2* 3.2*  CL 106 109  CO2 25 25  GLUCOSE 141* 215*  BUN 10 10  CREATININE 0.64 0.56  CALCIUM 8.7* 8.5*  PROT  --  6.4*  ALBUMIN  --  3.0*  AST  --  11*  ALT  --  11  ALKPHOS  --  91  BILITOT  --  1.2  GFRNONAA >60 >60  GFRAA >60 >60  ANIONGAP 10 7     Hematology Recent Labs  Lab 06/25/19 1738 06/26/19 0418  WBC 8.7 7.3  RBC 4.23 4.13  HGB 10.8* 10.8*  HCT 36.6 35.1*  MCV 86.5 85.0  MCH 25.5* 26.2  MCHC 29.5* 30.8  RDW 15.0 15.2  PLT 254 249    BNP Recent Labs  Lab 06/25/19 1738  BNP 65.6     DDimer No results for input(s): DDIMER in the last 168 hours.   Radiology    Dg Chest Portable 1 View  Result Date: 06/25/2019 CLINICAL DATA:  Chest pressure, shortness of breath. EXAM: PORTABLE CHEST 1 VIEW COMPARISON:  07/28/2017 FINDINGS: No signs of dense consolidation or pleural effusion. Heart size remains enlarged with ectatic thoracic aorta. Healed right humeral fracture, no acute bone finding. IMPRESSION: No signs of acute cardiopulmonary disease with marked cardiomegaly. Electronically Signed   By: Zetta Bills M.D.   On: 06/25/2019 17:28       Assessment & Plan    Chest pain atypical  HsTN neg x 3  Coronary Arteries Nonobstructive [id number]  HTN  Morbid obesity  Non cardiac chest pain  [facility] will sign off.   Medication Recommendations:   none Other  recommendations (labs, testing, etc):   Follow up as an outpatient:  None recommended  folluwp with PCP  For questions or updates, please contact [place] Please consult [email or web address] for contact info under        Signed, Virl Axe, MD  06/27/2019, 10:15 AM

## 2019-06-27 NOTE — Discharge Instructions (Signed)
Check BP at home and keep a diary of your readings at different times during the day 1-2 times per week-- bring to PCP

## 2019-06-27 NOTE — Evaluation (Signed)
Occupational Therapy Evaluation Patient Details Name: Whitney [name] MRN: [id number] DOB: 1956-09-11 Today's Date: 06/27/2019    History of Present Illness 63 yo admitted with chest pain with negative troponin. PMHx: arthritis, depression, DM, glaucoma, HLD, HTN, CAD   Clinical Impression   Pt admitted with chest pain with negative troponin. Pt currently with functional limitations due to the deficits listed below (see OT Problem List). Pt requires variable level of assistance due to elevated surfaces. Pt can complete transfer with min guard from elevated surface. However, if the patient is on a low surface they require mod x 2. Patient educated about [facility] services but doe not want other people in her home to further assess transfers to complete showering and increase in strength as her sister will only be able to assist intermittently.     Pt will benefit from skilled OT to increase their safety and independence with ADL and functional mobility for ADL to facilitate discharge to venue listed below.       Follow Up Recommendations  Home health OT;Supervision/Assistance - 24 hour    Equipment Recommendations       Recommendations for Other Services       Precautions / Restrictions Precautions Precautions: Fall Restrictions Weight Bearing Restrictions: No      Mobility Bed Mobility               General bed mobility comments: presented OOB   Transfers Overall transfer level: Needs assistance Equipment used: Rolling walker (2 wheeled) Transfers: Sit to/from Stand Sit to Stand: Min guard;From elevated surface         General transfer comment: if not from elevated surfaces mod x2     Balance Overall balance assessment: Needs assistance Sitting-balance support: Feet supported       Standing balance support: Bilateral upper extremity supported Standing balance-Leahy Scale: Poor                             ADL either performed or assessed with clinical  judgement   ADL Overall ADL's : Needs assistance/impaired Eating/Feeding: Modified independent;Sitting   Grooming: Wash/dry hands;Min guard;Standing   Upper Body Bathing: Set up;Sitting;Bed level;Cueing for safety   Lower Body Bathing: Minimal assistance;Sit to/from stand;Cueing for safety;Cueing for sequencing   Upper Body Dressing : Set up;Standing   Lower Body Dressing: Minimal assistance;Sit to/from stand   Toilet Transfer: Minimal assistance;Comfort height toilet;Requires wide/bariatric   Toileting- Clothing Manipulation and Hygiene: Minimal assistance;Cueing for safety;Cueing for sequencing;Sit to/from stand   Tub/ Banker: Walk-in shower;Moderate assistance;Grab bars;Cueing for safety;Cueing for sequencing   Functional mobility during ADLs: Min guard;Rolling walker;Cueing for sequencing General ADL Comments: pt requires elevated surfaces      Vision Baseline Vision/History: Wears glasses Wears Glasses: At all times Patient Visual Report: No change from baseline Vision Assessment?: No apparent visual deficits     Perception Perception Perception Tested?: No   Praxis Praxis Praxis tested?: Not tested    Pertinent Vitals/Pain Pain Assessment: Faces Faces Pain Scale: No hurt     Hand Dominance     Extremity/Trunk Assessment Upper Extremity Assessment Upper Extremity Assessment: Overall WFL for tasks assessed   Lower Extremity Assessment Lower Extremity Assessment: Defer to PT evaluation   Cervical / Trunk Assessment Cervical / Trunk Assessment: Kyphotic   Communication Communication Communication: No difficulties   Cognition Arousal/Alertness: Awake/alert Behavior During Therapy: WFL for tasks assessed/performed Overall Cognitive Status: Within Functional Limits for tasks assessed  General Comments       Exercises     Shoulder Instructions      Home Living Family/patient expects to be  discharged to:: Private residence Living Arrangements: Alone Available Help at Discharge: Friend(s);Available PRN/intermittently Type of Home: House Home Access: Ramped entrance     Home Layout: One level     Bathroom Shower/Tub: Occupational psychologist: Standard Bathroom Accessibility: Yes How Accessible: Accessible via walker Home Equipment: [place] - 2 wheels;Cane - single point;Tub bench;Other (comment)   Additional Comments: lift chair and adjustable bed without rails      Prior Functioning/Environment Level of Independence: Independent with assistive device(s)        Comments: uses RW most of the time, does her own housework and driving        OT Problem List: Decreased strength;Decreased activity tolerance;Impaired balance (sitting and/or standing);Decreased knowledge of use of DME or AE      OT Treatment/Interventions: Self-care/ADL training;Therapeutic exercise;DME and/or AE instruction;Therapeutic activities;Patient/family education;Balance training    OT Goals(Current goals can be found in the care plan section) Acute Rehab OT Goals Patient Stated Goal: to go home soon OT Goal Formulation: With patient Time For Goal Achievement: 07/04/19 Potential to Achieve Goals: Good  OT Frequency: Min 2X/week   Barriers to D/C: Decreased caregiver support  sister only able to help sometimes       Co-evaluation              AM-PAC OT "6 Clicks" Daily Activity     Outcome Measure Help from another person eating meals?: None Help from another person taking care of personal grooming?: A Little Help from another person toileting, which includes using toliet, bedpan, or urinal?: A Little Help from another person bathing (including washing, rinsing, drying)?: A Lot Help from another person to put on and taking off regular upper body clothing?: A Little Help from another person to put on and taking off regular lower body clothing?: A Lot 6 Click Score: 17    End of Session Equipment Utilized During Treatment: Gait belt;Rolling walker  Activity Tolerance: Patient limited by fatigue Patient left: in chair;with chair alarm set;with nursing/sitter in room  OT Visit Diagnosis: Unsteadiness on feet (R26.81);Repeated falls (R29.6);Muscle weakness (generalized) (M62.81);History of falling (Z91.81)                Time: [id number] OT Time Calculation (min): 53 min Charges:  OT General Charges $OT Visit: 1 Visit OT Evaluation $OT Eval Low Complexity: 1 Low OT Treatments $Self Care/Home Management : 38-52 mins  Joeseph Amor OTR/L  Acute Rehab Services  [phone] office number [phone] pager number   Joeseph Amor 06/27/2019, 10:17 AM

## 2019-06-30 ENCOUNTER — Other Ambulatory Visit: Payer: Self-pay

## 2019-06-30 DIAGNOSIS — I10 Essential (primary) hypertension: Secondary | ICD-10-CM | POA: Diagnosis not present

## 2019-06-30 DIAGNOSIS — M65332 Trigger finger, left middle finger: Secondary | ICD-10-CM | POA: Diagnosis not present

## 2019-06-30 DIAGNOSIS — Z79899 Other long term (current) drug therapy: Secondary | ICD-10-CM | POA: Diagnosis not present

## 2019-06-30 DIAGNOSIS — I872 Venous insufficiency (chronic) (peripheral): Secondary | ICD-10-CM | POA: Diagnosis not present

## 2019-06-30 DIAGNOSIS — D649 Anemia, unspecified: Secondary | ICD-10-CM | POA: Diagnosis not present

## 2019-06-30 NOTE — Patient Outreach (Signed)
  [facility]) Care Management Chronic Special Needs Program  06/30/2019  Name: Whitney [name] DOB: May 09, 1956  MRN: [id number]  Whitney [name] is enrolled in a Chronic Special Needs Plan. RNCM called review health risk assessment and complete individualized care plan. No answer. HIPAA compliant message left.   Plan: Chronic care management coordinator will attempt outreach within 1-2 weeks.  Thea Silversmith, RN, MSN, [place] [phone]

## 2019-07-03 ENCOUNTER — Other Ambulatory Visit: Payer: Self-pay

## 2019-07-03 ENCOUNTER — Other Ambulatory Visit: Payer: Self-pay | Admitting: Pharmacist

## 2019-07-03 NOTE — Patient Outreach (Signed)
-   [facility]) Care Management Chronic Special Needs Program    07/03/2019  Name: Whitney [name], DOB: 1956/02/16  MRN: [id number]   Ms. Jayleah Lovaglio is enrolled in a chronic special needs plan for Diabetes.   Care Coordination: RNCM called primary care office updated regarding client receptive to speaking with nutritionist/diabetes educator.  Thea Silversmith, RN, MSN, [place] [phone]

## 2019-07-03 NOTE — Patient Outreach (Signed)
[name] Whitney [name]) Care Management Chronic Special Needs Program  07/03/2019  Name: Whitney [name] DOB: Apr 27, 1956  MRN: [id number]  Ms. Whitney [name] is enrolled in a chronic special needs plan for Diabetes. Chronic Care Management Coordinator telephoned client to review [name] risk assessment and to develop individualized care plan.  Introduced the chronic care management program, importance of client participation, and taking their care plan to all provider appointments and inpatient facilities.  Reviewed the transition of care process and possible referral to community care management.  Subjective: client reports history of diabetes, heart disease, heart attack, HTN and heart failure and glaucoma. She states recent visit to the hospital due to chest pain. She reports her blood pressure medication was increased. She has followed up with primary care and that her blood pressure is better. Client states she has another follow up this month with provider. She also reports that her sister is taking her to get a blood pressure monitor so that she can also check her blood pressure.   RNCM discussed client's history of heart failure (not noted on [name] risk assessment). Client did not seem familiar with the term, but states she had fluid volume problem. Client reports she weighs self every other day and keeps track of her weights. She states "if I feel like something is wrong, I call."  Client reports a history of diabetes. Client states her last A1C was 7.5 which is better than it has been in the past. Client reports her preference to be in the diabetes C-SNP program, but also receptive to receiving education regarding heart failure management.   Client states she takes medications as prescribed, but reports she is not able to afford her Lumagan eye drops. She reports with co-pay visits and cost of medications, she has not been back to see an eye doctor. She denies any visual deficits at this  time. Ms. Whitney [name] is receptive to Whitney [name] referral for medication review and possible assistance.  Goals Addressed            This Visit's Progress   .  Acknowledge receipt of Whitney [name], broadcasting/film/video      . Client understands the importance of follow-up with providers by attending scheduled visits      . Client verbalize knowledge of Heart Failure disease self management skills within the next 6-9 months.      . Client verbalizes knowledge of Heart Attack self management skills within the next 6-9 months.      . Client will report abillity to obtain Medications within 6 months.      . Client will report no worsening of symptoms related to heart disease within the next 6-9 months.      . Client will use Assistive Devices as needed and verbalize understanding of device use      . Client will verbalize knowledge of self management of Hypertension as evidences by BP reading of 140/90 or less; or as defined by provider      . Decrease inpatient diabetes admissions/readmissions with in the year      . Decrease the use of hospital emergency department related to diabetes within the next year       . Diabetes Patient stated goal "loose weight" (pt-stated)      . HEMOGLOBIN A1C < 7.0      . Maintain timely refills of diabetic medication as prescribed within the year .      Marland Kitchen Obtain annual  Lipid Profile, LDL-C      .  Obtain Annual Eye (retinal)  Exam       . Obtain Annual Foot Exam      . Obtain annual screen for micro albuminuria (urine) , nephropathy (kidney problems)      . Obtain Hemoglobin A1C at least 2 times per year      . Visit Primary Care Provider or Endocrinologist at least 2 times per year         RNCM discussed at length the importance of taking medications as prescribed, daily weights, following up with provider and knowing when to call the doctor. Action plan discussed with client. RNCM encouraged client to speak with [name] care concierge regarding in [facility] providers and  eye benefits. RNCM also encouraged client to call to discuss glucose meter benefits. RNCM offered to warm transfer client, but client declined at this time and states she will call.   RNCM discussed referral to nutrition program. Client is receptive to discussion with nutritionist.   RNCM reinforced the availability of the 24 hour nurse advice line; encouraged client to call [name] care concierge with benefits questions as needed and client encouraged RNCM to call as needed.   Covid 19 precautions discussed.   Plan:  Send successful outreach letter with a copy of their individualized care plan, Send individual care plan to provider and Send educational material, RNCM will send advanced directive packet.   Chronic care management coordination will outreach within 6 Months Will refer client to: [facility] pharmacist   Thea Silversmith, RN, MSN, Whitney [name] [place] [phone]

## 2019-07-03 NOTE — Patient Outreach (Signed)
[facility]) Care Management  [place]   07/03/2019  Whitney [name] Jun 29, 1956 [id number]  Reason for referral: Medication Assistance with Lumigan  Referral source: [employer] C-SNP Care Manager with [facility] Current insurance: [employer] C-SNP  PMHx includes but not limited to:    Outreach:  Unsuccessful telephone call attempt #1 to patient. Patient answered phone but requests that I call her next week as she is very busy today.   Plan:  Will f/u with patient on Monday morning per pt request  Ralene Bathe, PharmD, [place] [phone]

## 2019-07-06 ENCOUNTER — Ambulatory Visit: Payer: Self-pay | Admitting: Pharmacist

## 2019-07-06 ENCOUNTER — Other Ambulatory Visit: Payer: Self-pay | Admitting: Pharmacist

## 2019-07-06 NOTE — Patient Outreach (Signed)
[facility]) Care Management  [place]   07/06/2019  Whitney [name] [birth year] [id number]  Reason for referral: Medication Assistance with Lumigan  Referral source: [employer] C-SNP Care Manager with [facility] Current insurance: [employer] C-SNP  PMHx includes but not limited to:  T2DM, CAD, hx cardiomyopathy that subsequently resolved, HTN, OSA with recent hospitalization 9/24-9/26 for atypical chest pain, found to have negative troponins, cards consult with possible venous insufficiency diagnosis, several medications stopped at discharge, including Lumigan, and dose adjustment for lisinopril  Outreach:  Successful telephone call with patient.  HIPAA identifiers verified.   Subjective:  Patient reports her Lumigan eye drops cost $45 co-pay / month in 2017 / 2018 and she has not had prescription filled since then.  She is not sure if she has active prescription for eye drops since she has not been back to see the eye doctor "for a while."  She reports her other medications are $3.60 for generics and $8.95 for brand.  She thinks she was told she has Extra Help in the past but doesn't remember how long ago it was. She is not able to review medications at this time.   Objective: The ASCVD Risk score Mikey Bussing DC Jr., et al., 2013) failed to calculate for the following reasons:   Cannot find a previous HDL lab   Cannot find a previous total cholesterol lab  Lab Results  Component Value Date   CREATININE 0.56 06/26/2019   CREATININE 0.64 06/25/2019   CREATININE 0.60 07/28/2017    Lab Results  Component Value Date   HGBA1C 7.5 (H) 06/26/2019    Lipid Panel  No results found for: CHOL, TRIG, HDL, CHOLHDL, VLDL, LDLCALC, LDLDIRECT  BP Readings from Last 3 Encounters:  06/27/19 (!) 149/80  08/29/17 116/74  08/16/17 115/68    Allergies  Allergen Reactions  . Contrast Media [Iodinated Diagnostic Agents] Anaphylaxis    Medications Reviewed  Today    Reviewed by Luretha Rued, RN (Registered Nurse) on 07/03/19 at [phone]  Med List Status: <None>  Medication Order Taking? Sig Documenting Provider Last Dose Status Informant  amLODipine (NORVASC) 5 MG tablet [id number] Yes Take 1 tablet (5 mg total) by mouth daily. Geradine Girt, DO Taking Active   aspirin 325 MG tablet [id number] Yes Take 1 tablet (325 mg total) by mouth at bedtime. Arrien, Jimmy Picket, MD Taking Active Self  BD INSULIN SYRINGE U/F 31G X 5/16" 0.5 ML MISC [id number]  as directed.  [provider]  Active Self  blood glucose meter kit and supplies KIT [id number]  Dispense based on patient and insurance preference. Use up to four times daily as directed. (FOR ICD-9 250.00, 250.01). Medina-Vargas, Monina C, NP  Active Self  Chlorphen-Phenyleph-APAP (CORICIDIN D COLD/FLU/SINUS) 2-5-325 MG TABS [id number] Yes Take 1 tablet by mouth every 6 (six) hours as needed (for congestion or cold-like symptoms).  [provider] Taking Active Self  clobetasol ointment (TEMOVATE) 0.05 % [id number] Yes Apply 1 application topically 2 (two) times daily as needed (as directed- avoid open wounds).  [provider] Taking Active Self  cyclobenzaprine (FLEXERIL) 10 MG tablet [id number] Yes Take 10 mg by mouth 2 (two) times daily.  [provider] Taking Active Self  docusate sodium (COLACE) 100 MG capsule [id number] Yes Take 200 mg by mouth 2 (two) times daily as needed for mild constipation or moderate constipation. [provider] Taking Active Self  furosemide (LASIX) 20 MG tablet [id number] Yes Take  1 tablet (20 mg total) by mouth daily. Medina-Vargas, Monina C, NP Taking Active Self  gabapentin (NEURONTIN) 300 MG capsule [id number] Yes Take 300 mg by mouth See admin instructions. Take 300 mg by mouth two times a day and an additional 300 mg once daily as needed for nerve pain [provider] Taking Active Self           Med Note Juleen China, Deno Etienne   Fri Jul 03, 2019  9:33 AM) Reports takes three times/day.  JARDIANCE 25 MG TABS tablet [id number] Yes Take 25 mg by mouth daily. [provider] Taking Active Self  lisinopril (ZESTRIL) 40 MG tablet [id number] Yes Take 1 tablet (40 mg total) by mouth daily. Geradine Girt, DO Taking Active   metoprolol succinate (TOPROL-XL) 100 MG 24 hr tablet [id number] Yes Take 1 tablet (100 mg total) by mouth daily before breakfast. Take with or immediately following a meal. Medina-Vargas, Monina C, NP Taking Active Self  NOVOLIN N 100 UNIT/ML injection [id number] Yes Inject 15 Units into the skin 2 (two) times daily after a meal.  [provider] Taking Active Self           Med Note Duffy Bruce, Legrand Como   Thu Jun 25, 2019  8:20 PM) Regimen confirmed to be accurate by the patient  oxybutynin (DITROPAN) 5 MG tablet [id number] Yes Take 1 tablet (5 mg total) by mouth daily. Medina-Vargas, Monina C, NP Taking Active Self  pantoprazole (PROTONIX) 40 MG tablet [id number] Yes Take 1 tablet (40 mg total) by mouth daily. Medina-Vargas, Monina C, NP Taking Active Self  potassium chloride (K-DUR) 10 MEQ tablet [id number] Yes Take 1 tablet (10 mEq total) by mouth daily. Medina-Vargas, Monina C, NP Taking Active Self  pravastatin (PRAVACHOL) 40 MG tablet [id number] Yes Take 1 tablet (40 mg total) by mouth at bedtime. Medina-Vargas, Monina C, NP Taking Active Self  traMADol (ULTRAM) 50 MG tablet [id number] Yes Take 2 tablets (100 mg total) by mouth every 8 (eight) hours as needed for severe pain. Medina-Vargas, Senaida Lange, NP Taking Active Self  Med List Note Jenny Reichmann 01/19/13 1034): CPAP          Assessment: Patient declines medication review at this time.  Unable to assess adherence.    Medication Assistance Findings:  Medication assistance needs identified: Lumigan  Extra Help:  Already receiving Full Extra Help Low Income Subsidy  -[facility] and confirmed Full Extra  Help co-pays.  Pharmacy unable to run test claim for Lumigan without active RX.  No current RX on file.  Since patient has Full Extra Help, she is not eligible for any patient assistance programs.  Estimated co-pay will be $8.95 for Lumigan.    -Reviewed benefits of using [facility] mail order pharmacy with patient as well as patient will save money on medications with 90 day supplies.  Patient voiced understanding.  She is planning on calling PCP and eye doctor to request prescriptions be sent to [facility] and for appt with eye doctor if needed to be able to fill Lumigan.   Plan: . Will f/u with patient in 1-2 months regarding medications  Ralene Bathe, PharmD, [place] [phone]

## 2019-07-08 ENCOUNTER — Other Ambulatory Visit: Payer: Self-pay

## 2019-07-08 NOTE — Patient Outreach (Signed)
  [facility] [facility]) Care Management Chronic Special Needs Program   07/08/2019  Name: DAZIYA CERECERES, DOB: 1956-04-05  MRN: [id number]  The client was discussed in today's interdisciplinary care team meeting.  The following issues were discussed:  Client's needs, Changes in health status, Care Plan, Coordination of care and Care transitions  Participants present:                     Thea Silversmith, MSN, RN, CCM                     Melissa Sandlin RN,BSN,CCM, CDE  Kelli Churn, RN, CCM, CDE Quinn Plowman RN, BSN, CCM            Marco Collie, MD Maryella Shivers, MD            Gilda Crease, PharmD, RPh Bary Castilla, RN, BSN, MS, CCM Coralie Carpen, MD  Plan: [facility] will continue to follow as client's C-SNP chronic care management coordinator.   Thea Silversmith, RN, MSN, [place] [phone]

## 2019-07-09 ENCOUNTER — Ambulatory Visit: Payer: HMO | Admitting: Orthopaedic Surgery

## 2019-07-14 ENCOUNTER — Encounter: Payer: Self-pay | Admitting: Orthopaedic Surgery

## 2019-07-14 ENCOUNTER — Ambulatory Visit (INDEPENDENT_AMBULATORY_CARE_PROVIDER_SITE_OTHER): Payer: HMO | Admitting: Orthopaedic Surgery

## 2019-07-14 ENCOUNTER — Other Ambulatory Visit: Payer: Self-pay

## 2019-07-14 VITALS — Ht 63.0 in | Wt 295.0 lb

## 2019-07-14 DIAGNOSIS — M65332 Trigger finger, left middle finger: Secondary | ICD-10-CM

## 2019-07-14 MED ORDER — METHYLPREDNISOLONE ACETATE 40 MG/ML IJ SUSP
13.3300 mg | INTRAMUSCULAR | Status: AC | PRN
  Administered 2019-07-14: 14:00:00 13.33 mg

## 2019-07-14 MED ORDER — LIDOCAINE HCL 1 % IJ SOLN
1.0000 mL | INTRAMUSCULAR | Status: AC | PRN
  Administered 2019-07-14: 1 mL

## 2019-07-14 MED ORDER — BUPIVACAINE HCL 0.25 % IJ SOLN
0.3300 mL | INTRAMUSCULAR | Status: AC | PRN
  Administered 2019-07-14: 14:00:00 .33 mL

## 2019-07-14 NOTE — Progress Notes (Signed)
Office Visit Note   Patient: Whitney [name]           Date of Birth: 1956/08/23           MRN: [id number] Visit Date: 07/14/2019              Requested by: [name], MD [address],  [place] [address] PCP: [name], MD   Assessment & Plan: Visit Diagnoses:  1. Trigger finger, left middle finger     Plan: Impression is left long trigger finger.  We we will inject this today with cortisone.  She will follow-up with [place] as needed.  Follow-Up Instructions: Return if symptoms worsen or fail to improve.   Orders:  Orders Placed This Encounter  Procedures  . Hand/UE Inj: L long A1   No orders of the defined types were placed in this encounter.     Procedures: Hand/UE Inj: L long A1 for trigger finger on 07/14/2019 1:52 PM Indications: pain Details: 25 G needle Medications: 0.33 mL bupivacaine 0.25 %; 1 mL lidocaine 1 %; 13.33 mg methylPREDNISolone acetate 40 MG/ML      Clinical Data: No additional findings.   Subjective: Chief Complaint  Patient presents with  . Left Middle Finger - Pain    HPI patient is a pleasant 63 year old right-hand-dominant female who presents to our clinic today with pain and triggering to the left long finger.  This has been ongoing for about 3 to 4 weeks.  No injury.  No previous history of trigger fingers.  Review of Systems as detailed in HPI.  All others reviewed and are negative.   Objective: Vital Signs: Ht 5\' 3"  (1.6 m)   Wt 295 lb (133.8 kg)   BMI 52.26 kg/m   Physical Exam well-developed and well-nourished female in no acute distress.  Alert and oriented x3.  Ortho Exam examination of her left hand reveals a markedly tender area to the A1 pulley but really not much of a nodule.  She does have active triggering.  She is neurovascularly intact distally.  Specialty Comments:  No specialty comments available.  Imaging: No new imaging   PMFS History: Patient Active Problem List   Diagnosis Date Noted  . Hypokalemia 06/25/2019  . History of cardiomyopathy 06/25/2019  . Pain due to onychomycosis of toenails of both feet 03/18/2019  . Porokeratosis 03/18/2019  . Acute right lumbar radiculopathy 07/25/2017  . At risk for adverse drug event 07/16/2017  . Humerus fracture 07/12/2017  . Morbid obesity due to excess calories ([place]) 12/14/2015  . Chronic systolic heart failure ([place]) 12/14/2015  . Chest pain 12/14/2015  . Angina decubitus ([place]) 12/14/2015  . Adiposity 01/06/2013  . Arthritis of knee, degenerative 01/06/2013  . OSA (obstructive sleep apnea) 11/30/2011  . Cardiac arrest ([place]) 11/13/2011  . Diabetes mellitus type 2, uncontrolled ([place]) 11/13/2011   Past Medical History:  Diagnosis Date  . Arthritis    left knee,right hip  . Cardiac arrest ([place])   . Depression   . Diabetes mellitus   . GERD (gastroesophageal reflux disease)   . Glaucoma   . H/O cardiac arrest 11/2011   PEA  . Heart murmur   . Hyperlipidemia   . Hypertension   . Myocardial infarction ([place]) 2013  . Sleep apnea    Bring machine,mask and tubing    Family History  Problem Relation Age of Onset  . Asthma Mother     Past Surgical History:  Procedure Laterality Date  .  BACK SURGERY    . COLONOSCOPY N/A 09/12/2016   Procedure: COLONOSCOPY;  Surgeon: Clarene Essex, MD;  Location: [facility];  Service: Endoscopy;  Laterality: N/A;  . CYSTOSCOPY W/ URETERAL STENT PLACEMENT Left 01/04/2013   Procedure: CYSTOSCOPY WITH RETROGRADE PYELOGRAM/URETERAL STENT PLACEMENT;  Surgeon: Bernestine Amass, MD;  Location: [facility];  Service: Urology;  Laterality: Left;  . CYSTOSCOPY WITH RETROGRADE PYELOGRAM, URETEROSCOPY AND STENT PLACEMENT Left 01/26/2013   Procedure: CYSTOSCOPY, JJ STENT REMOVAL, LEFT URETEROSCOPY, ;  Surgeon: Bernestine Amass, MD;  Location: [facility];  Service: Urology;  Laterality: Left;  CYSTO, JJ STENT REMOVAL, LEFT URETEROSCOPY   . FOOT SURGERY    . HERNIA REPAIR    . HOLMIUM LASER  APPLICATION Left [id number]   Procedure: HOLMIUM LASER LITHOTRIPSY ;  Surgeon: Bernestine Amass, MD;  Location: [facility];  Service: Urology;  Laterality: Left;  . KNEE SURGERY     left  . PARTIAL HYSTERECTOMY     Social History   Occupational History  . Not on file  Tobacco Use  . Smoking status: Never Smoker  . Smokeless tobacco: Never Used  Substance and Sexual Activity  . Alcohol use: No  . Drug use: No  . Sexual activity: Never

## 2019-07-16 ENCOUNTER — Ambulatory Visit (HOSPITAL_BASED_OUTPATIENT_CLINIC_OR_DEPARTMENT_OTHER): Payer: HMO | Admitting: Internal Medicine

## 2019-07-27 ENCOUNTER — Ambulatory Visit: Payer: HMO | Admitting: Pharmacist

## 2019-07-28 DIAGNOSIS — I872 Venous insufficiency (chronic) (peripheral): Secondary | ICD-10-CM | POA: Diagnosis not present

## 2019-07-28 DIAGNOSIS — E7800 Pure hypercholesterolemia, unspecified: Secondary | ICD-10-CM | POA: Diagnosis not present

## 2019-07-28 DIAGNOSIS — Z79899 Other long term (current) drug therapy: Secondary | ICD-10-CM | POA: Diagnosis not present

## 2019-07-28 DIAGNOSIS — G629 Polyneuropathy, unspecified: Secondary | ICD-10-CM | POA: Diagnosis not present

## 2019-07-28 DIAGNOSIS — I5022 Chronic systolic (congestive) heart failure: Secondary | ICD-10-CM | POA: Diagnosis not present

## 2019-07-28 DIAGNOSIS — E1169 Type 2 diabetes mellitus with other specified complication: Secondary | ICD-10-CM | POA: Diagnosis not present

## 2019-07-28 DIAGNOSIS — Z794 Long term (current) use of insulin: Secondary | ICD-10-CM | POA: Diagnosis not present

## 2019-07-28 DIAGNOSIS — I10 Essential (primary) hypertension: Secondary | ICD-10-CM | POA: Diagnosis not present

## 2019-08-05 DIAGNOSIS — Z79899 Other long term (current) drug therapy: Secondary | ICD-10-CM | POA: Diagnosis not present

## 2019-08-05 DIAGNOSIS — E1169 Type 2 diabetes mellitus with other specified complication: Secondary | ICD-10-CM | POA: Diagnosis not present

## 2019-08-13 ENCOUNTER — Ambulatory Visit: Payer: HMO | Admitting: *Deleted

## 2019-08-25 DIAGNOSIS — M170 Bilateral primary osteoarthritis of knee: Secondary | ICD-10-CM | POA: Diagnosis not present

## 2019-08-25 DIAGNOSIS — Z7984 Long term (current) use of oral hypoglycemic drugs: Secondary | ICD-10-CM | POA: Diagnosis not present

## 2019-08-25 DIAGNOSIS — E1169 Type 2 diabetes mellitus with other specified complication: Secondary | ICD-10-CM | POA: Diagnosis not present

## 2019-08-25 DIAGNOSIS — I872 Venous insufficiency (chronic) (peripheral): Secondary | ICD-10-CM | POA: Diagnosis not present

## 2019-08-25 DIAGNOSIS — G629 Polyneuropathy, unspecified: Secondary | ICD-10-CM | POA: Diagnosis not present

## 2019-08-25 DIAGNOSIS — E7800 Pure hypercholesterolemia, unspecified: Secondary | ICD-10-CM | POA: Diagnosis not present

## 2019-09-03 ENCOUNTER — Ambulatory Visit: Payer: Self-pay | Admitting: Pharmacist

## 2019-09-03 ENCOUNTER — Other Ambulatory Visit: Payer: Self-pay | Admitting: Pharmacist

## 2019-09-03 NOTE — Patient Outreach (Signed)
[place] [facility]) Care Management  [place] 09/03/2019  Whitney [name] 08/10/1956 [id number]  Reason for call: f/u on Lumigan eye drops / 90 day supply of medications and supplies via [employer]:  Unsuccessful telephone call attempt #1 to patient. HIPAA compliant voicemail left requesting a return call  Plan:  -I will make another outreach attempt to patient within 3-4 business days.    Ralene Bathe, PharmD, [place] [phone]

## 2019-09-07 ENCOUNTER — Other Ambulatory Visit: Payer: Self-pay | Admitting: Pharmacist

## 2019-09-07 NOTE — Patient Outreach (Signed)
[facility] [facility]) Care Management  [place] 09/07/2019  Whitney [name] [birth year] [id number]  Incoming call and VM received from patient. Successful return call to Ms. Broers.  Patient states she has run out of her Lumigan and is no longer getting from eye doctor.  She is confused on where to have it filled and did not [facility] as was discussed in October.    3-way call to [facility] [phone]).  Representative reports patient was last seen on 11/2017.  Patient will need to be seen by provider before Lumigan eye drops can be refilled.  Representative states [employer] insurance is accepted and usually is no charge for co-pays.  Appt made for 12/8 at 10:30AM.  Patient voiced understanding.    Patient will ask for samples of Lumigan at appt and also request new [facility] for [facility].  Once co-pay confirmed tomorrow, patient will decide if she can afford [facility] vs 3 month via mail order.  She states she is going to continue with same insurance for 2021.   Plan: F/u with patient and pharmacy tomorrow afternoon re: co-pay for [place], PharmD, [place] [phone]

## 2019-09-08 ENCOUNTER — Other Ambulatory Visit: Payer: Self-pay | Admitting: Pharmacist

## 2019-09-08 ENCOUNTER — Ambulatory Visit: Payer: Self-pay | Admitting: Pharmacist

## 2019-09-08 DIAGNOSIS — H524 Presbyopia: Secondary | ICD-10-CM | POA: Diagnosis not present

## 2019-09-08 DIAGNOSIS — H401131 Primary open-angle glaucoma, bilateral, mild stage: Secondary | ICD-10-CM | POA: Diagnosis not present

## 2019-09-08 DIAGNOSIS — E119 Type 2 diabetes mellitus without complications: Secondary | ICD-10-CM | POA: Diagnosis not present

## 2019-09-08 DIAGNOSIS — H5203 Hypermetropia, bilateral: Secondary | ICD-10-CM | POA: Diagnosis not present

## 2019-09-08 DIAGNOSIS — H04123 Dry eye syndrome of bilateral lacrimal glands: Secondary | ICD-10-CM | POA: Diagnosis not present

## 2019-09-08 DIAGNOSIS — H52223 Regular astigmatism, bilateral: Secondary | ICD-10-CM | POA: Diagnosis not present

## 2019-09-08 NOTE — Patient Outreach (Addendum)
[facility]) Care Management  [place] 09/08/2019  Whitney [name] 1956-06-08 [id number]  Reason for call: f/u re: Lumigan RX and co-pay  Outreach:  Unsuccessful telephone call attempt #1 to patient. HIPAA compliant voicemail left requesting a return call  Plan:  -I will make another outreach attempt to patient within 3-4 business days.    Ralene Bathe, PharmD, [place] [phone]   Incoming call from patient.  She reports she went to see her eye doctor and did not have to pay co-pay.  Substitution for Lumigan was called into pharmacy but patient did not know if it was ready.   [facility].  Latanoprost eye drops called in and co-pay is $0.  Patient very appreciative and will go to pharmacy today to pick up.    Patient denies having other medication questions at this time.  I provided my phone number to patient to call me if you has any other medication related needs.   Plan: Will sign off but am happy to assist in the future as needed.   Ralene Bathe, PharmD, [place] [phone]

## 2019-09-08 NOTE — Progress Notes (Signed)
Whitney [name] ([id number]) Visit Report for 05/11/2019 Abuse/Suicide Risk Screen Details Patient Name: Date of Service: Whitney [name], Whitney [name] 05/11/2019 9:00 AM Medical Record [id number] Patient Account Number: [id number] Date of Birth/Sex: Treating RN: 02-Apr-1956 (63 y.o. Female) Carlene Coria Primary Care Anneta Rounds: Vertell Novak Other Clinician: Referring Keval Nam: Treating Gaby Harney/Extender:Madduri, Gladis Riffle, Dibas Weeks in Treatment: 0 Abuse/Suicide Risk Screen Items Answer ABUSE RISK SCREEN: Has anyone close to you tried to hurt or harm you recentlyo No Do you feel uncomfortable with anyone in your familyo No Has anyone forced you do things that you didnt want to doo No Electronic Signature(s) Signed: 09/08/2019 3:07:45 PM By: Carlene Coria RN Entered By: Carlene Coria on 05/11/2019 10:25:18 -------------------------------------------------------------------------------- Activities of Daily Living Details Patient Name: Date of Service: Whitney [name], Whitney [name] 05/11/2019 9:00 AM Medical Record [id number] Patient Account Number: [id number] Date of Birth/Sex: Treating RN: 1956-03-07 (63 y.o. Female) Carlene Coria Primary Care Wynton Hufstetler: Vertell Novak Other Clinician: Referring Carlisle Torgeson: Treating Peaches Vanoverbeke/Extender:Madduri, Gladis Riffle, Dibas Weeks in Treatment: 0 Activities of Daily Living Items Answer Activities of Daily Living (Please select one for each item) Drive Automobile Completely Able Take Medications Completely Able Use Telephone Completely Able Care for Appearance Completely Able Use Toilet Completely Able Bath / Shower Completely Able Dress Self Completely Able Feed Self Completely Able Walk Completely Able Get In / Out Bed Completely Able Housework Completely Able Prepare Meals Completely [place] for Self Completely Able Electronic Signature(s) Signed: 09/08/2019 3:07:45 PM By: Carlene Coria RN Entered By:  Carlene Coria on 05/11/2019 10:25:48 -------------------------------------------------------------------------------- Education Screening Details Patient Name: Date of Service: Whitney [name] 05/11/2019 9:00 AM Medical Record [id number] Patient Account Number: [id number] Date of Birth/Sex: Treating RN: August 28, 1956 (63 y.o. Female) Carlene Coria Primary Care Kennedy Bohanon: Vertell Novak Other Clinician: Referring Lakoda Mcanany: Treating Tonica Brasington/Extender:Madduri, Gladis Riffle, Dibas Weeks in Treatment: 0 Learning Preferences/Education Level/Primary Language Learning Preference: Explanation Highest Education Level: College or Above Preferred Language: English Cognitive Barrier Language Barrier: No Translator Needed: No Memory Deficit: No Emotional Barrier: No Cultural/Religious Beliefs Affecting Medical Care: No Physical Barrier Impaired Vision: Yes Glasses Impaired Hearing: No Decreased Hand dexterity: No Knowledge/Comprehension Knowledge Level: Medium Comprehension Level: Medium Ability to understand written Medium instructions: Ability to understand verbal Medium instructions: Motivation Anxiety Level: Calm Cooperation: Cooperative Education Importance: Acknowledges Need Interest in Health Problems: Asks Questions Perception: Coherent Willingness to Engage in Self- Medium Management Activities: Readiness to Engage in Self- Medium Management Activities: Electronic Signature(s) Signed: 09/08/2019 3:07:45 PM By: Carlene Coria RN Entered By: Carlene Coria on 05/11/2019 10:26:30 -------------------------------------------------------------------------------- Fall Risk Assessment Details Patient Name: Date of Service: Whitney [name] 05/11/2019 9:00 AM Medical Record [id number] Patient Account Number: [id number] Date of Birth/Sex: Treating RN: 08/11/1956 (63 y.o. Female) Carlene Coria Primary Care Mickal Meno: Vertell Novak Other Clinician: Referring Rustin Erhart:  Treating Solara Goodchild/Extender:Madduri, Gladis Riffle, Dibas Weeks in Treatment: 0 Fall Risk Assessment Items Have you had 2 or more falls in the last 12 monthso 0 No Have you had any fall that resulted in injury in the last 12 monthso 0 No FALLS RISK SCREEN History of falling - immediate or within 3 months 0 No Secondary diagnosis (Do you have 2 or more medical diagnoseso) 0 No Ambulatory aid None/bed rest/wheelchair/nurse 0 No Crutches/cane/walker 0 No Furniture 0 No Intravenous therapy Access/Saline/Heparin Lock 0 No Weak (short steps with or without shuffle, stooped but able to lift head 0 No while walking, may seek support from furniture) Impaired (short steps with shuffle, may have difficulty arising from  chair, 0 No head down, impaired balance) Mental Status Oriented to own ability 0 No Overestimates or forgets limitations 0 No Risk Level: Low Risk Score: 0 Electronic Signature(s) Signed: 09/08/2019 3:07:45 PM By: Carlene Coria RN Entered By: Carlene Coria on 05/11/2019 10:26:44 -------------------------------------------------------------------------------- Foot Assessment Details Patient Name: Date of Service: Whitney [name] 05/11/2019 9:00 AM Medical Record [id number] Patient Account Number: [id number] Date of Birth/Sex: Treating RN: 1955/11/14 (63 y.o. Female) Carlene Coria Primary Care Kosei Rhodes: Vertell Novak Other Clinician: Referring Roderica Cathell: Treating Darnisha Vernet/Extender:Madduri, Gladis Riffle, Dibas Weeks in Treatment: 0 Foot Assessment Items Site Locations + = Sensation present, - = Sensation absent, C = Callus, U = Ulcer R = Redness, W = Warmth, M = Maceration, PU = Pre-ulcerative lesion F = Fissure, S = Swelling, D = Dryness Assessment Right: Left: Other Deformity: No No Prior Foot Ulcer: No No Prior Amputation: No No Charcot Joint: No No Ambulatory Status: Ambulatory Without Help Gait: Steady Electronic Signature(s) Signed: 09/08/2019 3:07:45  PM By: Carlene Coria RN Entered By: Carlene Coria on 05/11/2019 10:29:21 -------------------------------------------------------------------------------- Nutrition Risk Screening Details Patient Name: Date of Service: Whitney [name], Whitney [name] 05/11/2019 9:00 AM Medical Record [id number] Patient Account Number: [id number] Date of Birth/Sex: Treating RN: [birth year] (63 y.o. Female) Carlene Coria Primary Care Maximum Reiland: Vertell Novak Other Clinician: Referring Tamesha Ellerbrock: Treating Cadee Agro/Extender:Madduri, Gladis Riffle, Dibas Weeks in Treatment: 0 Height (in): 63 Weight (lbs): 286 Body Mass Index (BMI): 50.7 Nutrition Risk Screening Items Score Screening NUTRITION RISK SCREEN: I have an illness or condition that made me change the kind and/or 0 No amount of food I eat I eat fewer than two meals per day 0 No I eat few fruits and vegetables, or milk products 0 No I have three or more drinks of beer, liquor or wine almost every day 0 No I have tooth or mouth problems that make it hard for me to eat 0 No I don't always have enough money to buy the food I need 0 No I eat alone most of the time 0 No I take three or more different prescribed or over-the-counter drugs a day 1 Yes 0 No Without wanting to, I have lost or gained 10 pounds in the last six months I am not always physically able to shop, cook and/or feed myself 2 Yes Nutrition Protocols Good Risk Protocol Provide education on Moderate Risk Protocol 0 nutrition High Risk Proctocol Risk Level: Moderate Risk Score: 3 Electronic Signature(s) Signed: 09/08/2019 3:07:45 PM By: Carlene Coria RN Entered By: Carlene Coria on 05/11/2019 10:27:14

## 2019-09-11 ENCOUNTER — Ambulatory Visit: Payer: Self-pay | Admitting: Pharmacist

## 2019-09-22 DIAGNOSIS — I10 Essential (primary) hypertension: Secondary | ICD-10-CM | POA: Diagnosis not present

## 2019-09-22 DIAGNOSIS — B379 Candidiasis, unspecified: Secondary | ICD-10-CM | POA: Diagnosis not present

## 2019-09-22 DIAGNOSIS — E1165 Type 2 diabetes mellitus with hyperglycemia: Secondary | ICD-10-CM | POA: Diagnosis not present

## 2019-10-06 ENCOUNTER — Ambulatory Visit: Payer: HMO | Attending: Internal Medicine

## 2019-10-06 DIAGNOSIS — Z20822 Contact with and (suspected) exposure to covid-19: Secondary | ICD-10-CM

## 2019-10-08 LAB — NOVEL CORONAVIRUS, NAA: SARS-CoV-2, NAA: NOT DETECTED

## 2019-10-14 ENCOUNTER — Ambulatory Visit: Payer: Self-pay

## 2019-10-14 NOTE — Telephone Encounter (Signed)
Outgoing call to patient.  Patient states that she went to deliver to her brother whom is positive of covid -19Patient who was in the car.  Wearing a mask.   States she just handed him the food and had a word of prayer with her brother.  Patient denies any sx. Contact was January 11.  Does have Hx of diabetes Related to Patient that she does need to quarantine.  Wears a mask if must go out.        Reason for Disposition . [1] CLOSE CONTACT COVID-19 EXPOSURE within last 14 days AND [2] NO symptoms  Answer Assessment - Initial Assessment Questions 1. COVID-19 CLOSE CONTACT: "Who is the person with the confirmed or suspected COVID-19 infection that you were exposed to?"   cuz 2. PLACE of CONTACT: "Where were you when you were exposed to COVID-19?" (e.g., home, school, medical waiting room; which city?)    outsid e car 3. TYPE of CONTACT: "How much contact was there?" (e.g., sitting next to, live in same house, work in same office, same building)    Sitting in car with mask 4. DURATION of CONTACT: "How long were you in contact with the COVID-19 patient?" (e.g., a few seconds, passed by person, a few minutes, 15 minutes or longer, live with the patient)     denies 5. MASK: "Were you wearing a mask?" "Was the other person wearing a mask?" Note: wearing a mask reduces the risk of an  otherwise close contact.    yes 6. DATE of CONTACT: "When did you have contact with a COVID-19 patient?" (e.g., how many days ago)     Monday Janury 7. COMMUNITY SPREAD: "Are there lots of cases of COVID-19 (community spread) where you live?" (See public health department website, if unsure)       denies 8. SYMPTOMS: "Do you have any symptoms?" (e.g., fever, cough, breathing difficulty, loss of taste or smell)    Denies 9. PREGNANCY OR POSTPARTUM: "Is there any chance you are pregnant?" "When was your last menstrual period?" "Did you deliver in the last 2 weeks?"     na 10. HIGH RISK: "Do you have any heart or  lung problems? Do you have a weak immune system?" (e.g., heart failure, COPD, asthma, HIV positive, chemotherapy, renal failure, diabetes mellitus, sickle cell anemia, obesity)       diabetes 11.  TRAVEL: "Have you traveled out of the country recently?" If so, "When and where?"  Also ask about out-of-state travel, since the CDC has identified some high-risk cities for community spread in the [place].  Note: Travel becomes less relevant if there is widespread community transmission where the patient lives.       denies  Protocols used: CORONAVIRUS (COVID-19) EXPOSURE-A-AH

## 2019-11-04 ENCOUNTER — Ambulatory Visit: Payer: HMO | Admitting: Podiatry

## 2019-11-04 ENCOUNTER — Encounter: Payer: Self-pay | Admitting: Podiatry

## 2019-11-04 ENCOUNTER — Other Ambulatory Visit: Payer: Self-pay

## 2019-11-04 DIAGNOSIS — K219 Gastro-esophageal reflux disease without esophagitis: Secondary | ICD-10-CM | POA: Insufficient documentation

## 2019-11-04 DIAGNOSIS — B351 Tinea unguium: Secondary | ICD-10-CM | POA: Diagnosis not present

## 2019-11-04 DIAGNOSIS — M79674 Pain in right toe(s): Secondary | ICD-10-CM | POA: Diagnosis not present

## 2019-11-04 DIAGNOSIS — M79675 Pain in left toe(s): Secondary | ICD-10-CM

## 2019-11-04 DIAGNOSIS — I059 Rheumatic mitral valve disease, unspecified: Secondary | ICD-10-CM | POA: Insufficient documentation

## 2019-11-04 DIAGNOSIS — E1165 Type 2 diabetes mellitus with hyperglycemia: Secondary | ICD-10-CM

## 2019-11-04 DIAGNOSIS — Q828 Other specified congenital malformations of skin: Secondary | ICD-10-CM

## 2019-11-04 DIAGNOSIS — E7800 Pure hypercholesterolemia, unspecified: Secondary | ICD-10-CM | POA: Insufficient documentation

## 2019-11-04 DIAGNOSIS — J301 Allergic rhinitis due to pollen: Secondary | ICD-10-CM | POA: Insufficient documentation

## 2019-11-04 DIAGNOSIS — E1159 Type 2 diabetes mellitus with other circulatory complications: Secondary | ICD-10-CM | POA: Insufficient documentation

## 2019-11-04 DIAGNOSIS — I10 Essential (primary) hypertension: Secondary | ICD-10-CM | POA: Insufficient documentation

## 2019-11-04 DIAGNOSIS — N3281 Overactive bladder: Secondary | ICD-10-CM | POA: Insufficient documentation

## 2019-11-04 NOTE — Progress Notes (Signed)
Patient ID: Whitney [name], female   DOB: 05-Mar-1956, 64 y.o.   MRN: [id number] Complaint:  Visit Type: Patient returns to my office for continued preventative foot care services. Complaint: Patient states" my nails have grown long and thick and become painful to walk and wear shoes" Patient has been diagnosed with DM with no foot complications. The patient presents for preventative foot care services. No changes to ROS.  Corn fifth toe right foot.  Painful callus left foot. Patient has not been seen for 8 months.  Podiatric Exam: Vascular: Dorsalis pedis and posterior tibial pulses are palpable. CR  WNL.  TG WNL. Sensorium: Diminished LOPS  B/L. Nail Exam: Pt has thick disfigured discolored nails with subungual debris noted bilateral entire nail hallux through fifth toenails Ulcer Exam: There is no evidence of ulcer or pre-ulcerative changes or infection. Orthopedic Exam: Muscle tone and strength are WNL. No limitations in general ROM. No crepitus or effusions noted. Foot type and digits show no abnormalities. Plantar flex 5th  Left foot. Skin: No Porokeratosis. No infection or ulcers. Heloma durum fifth toe right foot. Porokeratosis sub 5 left foot.  Diagnosis:  Onychomycosis, , Pain in right toe, pain in left toes,  Porokeratosis left foot.  Treatment & Plan Procedures and Treatment: Consent by patient was obtained for treatment procedures. The patient understood the discussion of treatment and procedures well. All questions were answered thoroughly reviewed. Debridement of mycotic and hypertrophic toenails, 1 through 5 bilateral and clearing of subungual debris. No ulceration, no infection noted.  Debridement of  porokeratosis Return Visit-Office Procedure: Patient instructed to return to the office for a follow up visit 3 months for continued evaluation and treatment.  Gardiner Barefoot DPM

## 2019-11-10 DIAGNOSIS — H04123 Dry eye syndrome of bilateral lacrimal glands: Secondary | ICD-10-CM | POA: Diagnosis not present

## 2019-11-10 DIAGNOSIS — H401131 Primary open-angle glaucoma, bilateral, mild stage: Secondary | ICD-10-CM | POA: Diagnosis not present

## 2019-11-10 DIAGNOSIS — E119 Type 2 diabetes mellitus without complications: Secondary | ICD-10-CM | POA: Diagnosis not present

## 2019-11-11 DIAGNOSIS — E1165 Type 2 diabetes mellitus with hyperglycemia: Secondary | ICD-10-CM | POA: Diagnosis not present

## 2019-11-11 DIAGNOSIS — Z794 Long term (current) use of insulin: Secondary | ICD-10-CM | POA: Diagnosis not present

## 2019-11-22 ENCOUNTER — Ambulatory Visit: Payer: HMO | Attending: Internal Medicine

## 2019-11-22 DIAGNOSIS — Z23 Encounter for immunization: Secondary | ICD-10-CM | POA: Insufficient documentation

## 2019-11-22 NOTE — Progress Notes (Signed)
   Covid-19 Vaccination Clinic  Name:  Whitney [name]    MRN: [id number] DOB: Jul 01, 1956  11/22/2019  [name] was observed post Covid-19 immunization for 15 minutes without incidence. She was provided with Vaccine Information Sheet and instruction to access the V-Safe system.   [name] was instructed to call 911 with any severe reactions post vaccine: Marland Kitchen Difficulty breathing  . Swelling of your face and throat  . A fast heartbeat  . A bad rash all over your body  . Dizziness and weakness    Immunizations Administered    Name Date Dose VIS Date Route   Pfizer COVID-19 Vaccine 11/22/2019 12:17 PM 0.3 mL 09/11/2019 Intramuscular   Manufacturer: [place]   Lot: [id number]   [place]: [id number]

## 2019-11-23 ENCOUNTER — Other Ambulatory Visit: Payer: Self-pay

## 2019-11-23 NOTE — Patient Outreach (Signed)
  [facility]) Care Management Chronic Special Needs Program    11/23/2019  Name: Whitney [name], DOB: 1956-06-09  MRN: [id number]   Ms. Whitney [name] is enrolled in a chronic special needs plan for Diabetes. RNCM received notification that client called the 24 hour nurse advice line-"caller states that when she tried to get her insulin it was going to cost her over $400".  RNCM spoke with [facility] pharmacist Ralene Bathe, [facility]- last pharmacist involved in her case regarding client's medication issue. Referral placed to [facility] pharmacist with request to contact client regarding medication cost.  Plan: RNCM will continue to follow.  Thea Silversmith, RN, MSN, [place] [phone]

## 2019-11-24 ENCOUNTER — Other Ambulatory Visit: Payer: Self-pay | Admitting: Pharmacist

## 2019-11-24 ENCOUNTER — Other Ambulatory Visit: Payer: Self-pay

## 2019-11-24 NOTE — Patient Outreach (Signed)
[name]   11/24/2019  Whitney [name] 1956-03-29 [id number]  Reason for referral: Medication Assistance with insulins  Referral source: [employer] [place] with [facility] Current insurance: [employer] C-SNP  [facility] pharmacy familiar with patient as medication assistance provided in 2020 for [place].  At this time, patient had FULL Extra Help.   PMHx includes but not limited to:  T2DM, osteoarthritis, polyneuropathy, venous insufficiency, HLD, morbid obesity, trigger finger, cardiomyopathy, CHF, OSA  Outreach:  Call placed to [facility].  Patient picked up Novolin N on 11/22/19 with $0 co-pay.   Successful telephone call with patient.  HIPAA identifiers verified.   Patient reports she is having trouble with paying for her glucometer.  She reports she has been using Accu-Check but this is no longer covered through HTA.  She is very concerned about using a new meter.  She states PCP called in new RX but she is not sure how much it will be or if she will know how to use it.     She also reports she originally was told her insulin would be >$400 because pharmacy did not have her current insurance on file.  After insurance updated, she confirms she did pick up insulin at $0 co-pay.    Another call placed to [facility].  One Touch Ultra Meter, test strips, lancets are all ready to be picked up at $0 co-pay.  Pharmacist confirms she can show patient how to use it in the store when she picks it up.  Patient voiced understanding.    Patient denies having other questions at this time and denies need for medication review today.  She is very Patent attorney of [facility] services.  She will pick up testing supplies today and has insulin already at home.   Plan: Will close [facility] pharmacy case as no other medication concerns   Ralene Bathe, PharmD, [place] [phone]

## 2019-11-24 NOTE — Patient Outreach (Signed)
  [place] [facility]) Care Management Chronic Special Needs Program    11/24/2019  Name: BRIAN ORREN, DOB: [birth year]  MRN: [id number]   Ms. Shameia Burfield is enrolled in a chronic special needs plan for Diabetes. RNCM called to follow up. No answer. HIPAA compliant message left.  Plan: RNCM will outreach within 1-2 weeks if no return call.  Thea Silversmith, RN, MSN, [place] [phone]

## 2019-12-01 ENCOUNTER — Ambulatory Visit: Payer: Self-pay

## 2019-12-02 ENCOUNTER — Other Ambulatory Visit: Payer: Self-pay

## 2019-12-02 NOTE — Patient Outreach (Signed)
[place] [facility]) Care Management Chronic Special Needs Program  12/02/2019  Name: Whitney [name] DOB: December 30, 1955  MRN: [id number]  Whitney [name] is enrolled in a chronic special needs plan for Diabetes. RNCM called to follow up regarding client medications and to assess if any additional are management needs.   Subjective: client reports she is doing well. She reports she has all  her medications and a new one touch glucose meter and denies any difficulty with use of meter. She reports blood sugar yesterday was 161, but she has not checked her blood sugar this morning as she is just getting up. Whitney [name] states she is eating healthier and has lost about 10 pounds.   Goals Addressed            This Visit's Progress   .  Acknowledge receipt of Programme researcher, broadcasting/film/video      . COMPLETED: Client understands the importance of follow-up with providers by attending scheduled visits       Voiced the importance of attending scheduled appointments.    . Client verbalize knowledge of Heart Failure disease self management skills within the next 6-9 months.   On track    Takes medications as directed. Attends appointments as scheduled.  Heart failure self-management actions: Know signs and symptoms of congestive heart failure exacerbation. Know when to call the doctor to who to call. Verbalize how fluid intake can affect congestive heart failure. Eat healthy and monitor salt intake. Visit your primary care or cardiologist as scheduled. Verbalized how daily weights help with heart failure management.     . COMPLETED: Client verbalizes knowledge of Heart Attack self management skills within the next 6-9 months.   On track    Discussed importance of continued follow up with providers as scheduled and taking medications as prescribed.  Reports takes medications as scheduled. States takes medications as prescribed. Client reports weight loss and eating healthier.      .  COMPLETED: Client will report abillity to obtain Medications within 6 months.       Reports she is able to obtain medications at this time.     . COMPLETED: Client will report no worsening of symptoms related to heart disease within the next 6-9 months.       Denies any worsening of symptoms related to heart disease.    . COMPLETED: Client will use Assistive Devices as needed and verbalize understanding of device use       Denies any difficulty with use of glucose meter or walker.    . Client will verbalize knowledge of self management of Hypertension as evidences by BP reading of 140/90 or less; or as defined by provider   On track    Discussed importance of taking medications.  Hypertension self management actions: Follow up with your doctor as scheduled. Take your medications as prescribed by your doctor. Ask your doctor "what is my target blood pressure range". Monitor your blood pressure and take results to your doctor's appointment.  Monitor the amount of salt you are eating. Continue to exercise as tolerated and remain active.    . COMPLETED: Decrease inpatient diabetes admissions/readmissions with in the year       No inpatient diabetes admission year 2020    . Decrease the use of hospital emergency department related to diabetes within the next year    On track    No ED visits related to diabetes year 2020. It is important for you to continue see your  providers as scheduled for recommended labs, procedures.    . Diabetes Patient stated goal "loose weight" (pt-stated)   On track    Discussed weight loss strategies. Reports lost about 10 pounds and is down from around 314 pounds to 291 pounds. Continue to eat healthy. Increase physical activity as tolerated. Mailed education:  "lower extremity exercises seated"; "weight loss tips"    . HEMOGLOBIN A1C < 7.0       Last A1C 8.8 on 08/05/2019 Diabetes self management actions:  Glucose monitoring per provider  recommendations  Eat Healthy  Check feet daily  Visit provider every 3-6 months as directed  Hbg A1C level every 3-6 months.  Eye Exam yearly  Ask your doctor, "What is my Target A1C goal?"  Ask your doctor, "What is my Target blood sugar range?"    . Maintain timely refills of diabetic medication as prescribed within the year .   On track    Client reports she has all her medications. Client denies any difficulty obtaining medications.  It is important for you to see your providers as scheduled for yearly exam, labs, prescription refills    . Obtain annual  Lipid Profile, LDL-C   On track    Done 08/05/2019  It is important for you to see your providers as scheduled for recommended labs.     . Obtain Annual Eye (retinal)  Exam    On track    Done 09/08/2019 It is important for you to continue to see your providers as scheduled for annual exam.     . COMPLETED: Obtain Annual Foot Exam       Done 11/04/2019    . COMPLETED: Obtain annual screen for micro albuminuria (urine) , nephropathy (kidney problems)       Done 04/01/2019    . Obtain Hemoglobin A1C at least 2 times per year   On track    Done 12/23/2018 and 08/05/2019 A1C 8.8  It is important for you to continue to see your providers as scheduled for recommended labs, procedures.    . Visit Primary Care Provider or Endocrinologist at least 2 times per year    On track    Provider seen 09/22/2019; 08/05/2019; 04/01/2019; 01/20/2019  It is important for you to continue to see your providers as scheduled for yearly exam, labs, prescription refills.         Plan: RNCM will send updated care plan to client; send updated care plan to primary care, send education. RNCM will outreach per tier level   in 6 months.    Thea Silversmith, RN, MSN, [place] [phone]   .

## 2019-12-07 DIAGNOSIS — E1169 Type 2 diabetes mellitus with other specified complication: Secondary | ICD-10-CM | POA: Diagnosis not present

## 2019-12-07 DIAGNOSIS — E7800 Pure hypercholesterolemia, unspecified: Secondary | ICD-10-CM | POA: Diagnosis not present

## 2019-12-07 DIAGNOSIS — I10 Essential (primary) hypertension: Secondary | ICD-10-CM | POA: Diagnosis not present

## 2019-12-07 DIAGNOSIS — I5022 Chronic systolic (congestive) heart failure: Secondary | ICD-10-CM | POA: Diagnosis not present

## 2019-12-07 DIAGNOSIS — H1010 Acute atopic conjunctivitis, unspecified eye: Secondary | ICD-10-CM | POA: Diagnosis not present

## 2019-12-11 DIAGNOSIS — H6123 Impacted cerumen, bilateral: Secondary | ICD-10-CM | POA: Diagnosis not present

## 2019-12-11 DIAGNOSIS — H1011 Acute atopic conjunctivitis, right eye: Secondary | ICD-10-CM | POA: Diagnosis not present

## 2019-12-16 ENCOUNTER — Ambulatory Visit: Payer: HMO | Attending: Internal Medicine

## 2019-12-16 DIAGNOSIS — Z23 Encounter for immunization: Secondary | ICD-10-CM

## 2019-12-16 NOTE — Progress Notes (Signed)
   Covid-19 Vaccination Clinic  Name:  Whitney [name]    MRN: [id number] DOB: 09-14-1956  12/16/2019  [name] was observed post Covid-19 immunization for 15 minutes without incident. She was provided with Vaccine Information Sheet and instruction to access the V-Safe system.   [name] was instructed to call 911 with any severe reactions post vaccine: Marland Kitchen Difficulty breathing  . Swelling of face and throat  . A fast heartbeat  . A bad rash all over body  . Dizziness and weakness   Immunizations Administered    Name Date Dose VIS Date Route   Pfizer COVID-19 Vaccine 12/16/2019 10:40 AM 0.3 mL 09/11/2019 Intramuscular   Manufacturer: [place]   Lot: [id number]   [place]: [id number]

## 2019-12-30 DIAGNOSIS — H903 Sensorineural hearing loss, bilateral: Secondary | ICD-10-CM | POA: Diagnosis not present

## 2019-12-30 DIAGNOSIS — H6123 Impacted cerumen, bilateral: Secondary | ICD-10-CM | POA: Diagnosis not present

## 2020-02-02 ENCOUNTER — Ambulatory Visit: Payer: HMO | Admitting: Podiatry

## 2020-02-17 DIAGNOSIS — E1169 Type 2 diabetes mellitus with other specified complication: Secondary | ICD-10-CM | POA: Diagnosis not present

## 2020-02-17 DIAGNOSIS — Z23 Encounter for immunization: Secondary | ICD-10-CM | POA: Diagnosis not present

## 2020-02-17 DIAGNOSIS — E2839 Other primary ovarian failure: Secondary | ICD-10-CM | POA: Diagnosis not present

## 2020-02-17 DIAGNOSIS — I872 Venous insufficiency (chronic) (peripheral): Secondary | ICD-10-CM | POA: Diagnosis not present

## 2020-02-17 DIAGNOSIS — I10 Essential (primary) hypertension: Secondary | ICD-10-CM | POA: Diagnosis not present

## 2020-02-17 DIAGNOSIS — G629 Polyneuropathy, unspecified: Secondary | ICD-10-CM | POA: Diagnosis not present

## 2020-02-17 DIAGNOSIS — E7800 Pure hypercholesterolemia, unspecified: Secondary | ICD-10-CM | POA: Diagnosis not present

## 2020-02-17 DIAGNOSIS — Z0001 Encounter for general adult medical examination with abnormal findings: Secondary | ICD-10-CM | POA: Diagnosis not present

## 2020-02-17 DIAGNOSIS — Z79899 Other long term (current) drug therapy: Secondary | ICD-10-CM | POA: Diagnosis not present

## 2020-02-17 DIAGNOSIS — Z794 Long term (current) use of insulin: Secondary | ICD-10-CM | POA: Diagnosis not present

## 2020-02-17 DIAGNOSIS — K219 Gastro-esophageal reflux disease without esophagitis: Secondary | ICD-10-CM | POA: Diagnosis not present

## 2020-02-23 ENCOUNTER — Other Ambulatory Visit: Payer: Self-pay | Admitting: Family Medicine

## 2020-02-24 DIAGNOSIS — E1169 Type 2 diabetes mellitus with other specified complication: Secondary | ICD-10-CM | POA: Diagnosis not present

## 2020-02-24 DIAGNOSIS — Z794 Long term (current) use of insulin: Secondary | ICD-10-CM | POA: Diagnosis not present

## 2020-03-04 ENCOUNTER — Other Ambulatory Visit: Payer: Self-pay | Admitting: Family Medicine

## 2020-03-04 DIAGNOSIS — E2839 Other primary ovarian failure: Secondary | ICD-10-CM

## 2020-03-04 DIAGNOSIS — R921 Mammographic calcification found on diagnostic imaging of breast: Secondary | ICD-10-CM

## 2020-04-15 DIAGNOSIS — H401131 Primary open-angle glaucoma, bilateral, mild stage: Secondary | ICD-10-CM | POA: Diagnosis not present

## 2020-04-29 ENCOUNTER — Ambulatory Visit: Payer: HMO | Admitting: Podiatry

## 2020-04-29 ENCOUNTER — Encounter: Payer: Self-pay | Admitting: Podiatry

## 2020-04-29 ENCOUNTER — Other Ambulatory Visit: Payer: Self-pay

## 2020-04-29 DIAGNOSIS — M79674 Pain in right toe(s): Secondary | ICD-10-CM

## 2020-04-29 DIAGNOSIS — M79675 Pain in left toe(s): Secondary | ICD-10-CM | POA: Diagnosis not present

## 2020-04-29 DIAGNOSIS — Q828 Other specified congenital malformations of skin: Secondary | ICD-10-CM | POA: Diagnosis not present

## 2020-04-29 DIAGNOSIS — L84 Corns and callosities: Secondary | ICD-10-CM

## 2020-04-29 DIAGNOSIS — B351 Tinea unguium: Secondary | ICD-10-CM

## 2020-04-29 DIAGNOSIS — E1165 Type 2 diabetes mellitus with hyperglycemia: Secondary | ICD-10-CM

## 2020-04-29 NOTE — Progress Notes (Signed)
This patient returns to my office for at risk foot care.  This patient requires this care by a professional since this patient will be at risk due to having diabetes.  Patient has painful corn fifth toe right foot and painful callus on the outside of both feet  B/L. This patient is unable to cut nails herself since the patient cannot reach her nails.These nails are painful walking and wearing shoes.  This patient presents for at risk foot care today.  General Appearance  Alert, conversant and in no acute stress.  Vascular  Dorsalis pedis and posterior tibial  pulses are palpable  bilaterally.  Capillary return is within normal limits  bilaterally. Temperature is within normal limits  bilaterally.  Neurologic  Senn-Weinstein monofilament wire test diminished  bilaterally. Muscle power within normal limits bilaterally.  Nails Thick disfigured discolored nails with subungual debris  from hallux to fifth toes bilaterally. No evidence of bacterial infection or drainage bilaterally.  Orthopedic  No limitations of motion  feet .  No crepitus or effusions noted.  No bony pathology or digital deformities noted.  Adducto varus fifth digit  Right foot.  Skin  normotropic skin with no porokeratosis noted bilaterally.  No signs of infections or ulcers noted. Porokeratosis sub 5th  B/L.  Corn 5th digit right foot.    Onychomycosis  Pain in right toes  Pain in left toes  Porokeratosis B/L  Corn fifth toe right foot.  Consent was obtained for treatment procedures.   Mechanical debridement of nails 1-5  bilaterally performed with a nail nipper.  Filed with dremel without incident.    Return office visit   3 months                   Told patient to return for periodic foot care and evaluation due to potential at risk complications.   Gardiner Barefoot DPM

## 2020-05-03 DIAGNOSIS — E7800 Pure hypercholesterolemia, unspecified: Secondary | ICD-10-CM | POA: Diagnosis not present

## 2020-05-03 DIAGNOSIS — Z7984 Long term (current) use of oral hypoglycemic drugs: Secondary | ICD-10-CM | POA: Diagnosis not present

## 2020-05-03 DIAGNOSIS — E1169 Type 2 diabetes mellitus with other specified complication: Secondary | ICD-10-CM | POA: Diagnosis not present

## 2020-05-23 ENCOUNTER — Other Ambulatory Visit: Payer: Self-pay

## 2020-05-23 NOTE — Patient Outreach (Addendum)
[place] [facility]) Care Management Chronic Special Needs Program  05/23/2020  Name: Whitney [name] DOB: 18-Aug-1956  MRN: [id number]  Ms. Whitney [name] is enrolled in a chronic special needs plan for Diabetes. RNCM called to follow up, review and update care plan.  Subjective: client reports she is unable to afford her diabetic medication Rybelsus. She reports that she was initially prescribed Turlicity, but was unable to continue due to inability to afford-"donut hole". Ms. Whitney [name] states she was then prescribed Rybelsus, but has not picked up the medication, adding her cost is $126 and states she cannot afford. Client states she has Novulin N and is taking 25 units in the morning and 15 units in the evening.  She states she is requesting a refill for Novulin N today and is not sure she will be able to afford the refill cost, but does not know yet what this cost will be. She states blood sugar this morning was 232. Last A1C 9.3 on 05/03/20.    Client also reports chronic knee pain: history of surgery on Left knee and states she needs a knee replacement in her right knee, but is not having surgery. States she has had difficulty with obtaining medication refill flexeril. She is unclear why she is not able to get this medication. Per client Tramadol helps keep pain manageable, but Client reports if she is up doing a lot then it will be a "9". She reports currently her pain is at a 3 while resting.  Client with a history of congestive heart failure. Client reports she does not weight self, because she started seeing her weight go up and she did not want to see her weight go up because she wants to loose weight. RNCM reinforced the purpose of dialy weights. Client acknowledges she has a scale and will begin weighing and recording weights. Client reports she does wear compression stockings. She denies edema or shortness of breath.   Client reports she has been experiencing episodes of chest tightness  primarily after she eats: "like a tight bra on you. It may be indigestion". She reports "a little bit" of chest tightness on top of her breast during assessment. RNCM encouraged client to call her provider. Client states she is calling provider now. Telephone assessment ended.    Goals Addressed              This Visit's Progress   .  COMPLETED:  Acknowledge receipt of Advanced Directive package        Client reports she has a copy and will complete at her leisure.    .  Client verbalize knowledge of Heart Failure disease self management skills within the next 6-9 months.   On track     Discussed signs/symptoms of heart failure exacerbation.  Continue to take medications as prescribed. Continue to attend appointments as scheduled. Client reports she has a scale.  RN care coordinator reviewed the importance of weighing self daily and calling provider if:  Weight increased greater than 3 pound overnight or 5 pounds in one week  Increased swelling or edema in feet, ankles, stomach or hands It is harder for you to breath ewhen lying down . You need to sit up. Chest discomfort, heaviness or pain. You feel more tired or have less energy than normal New or worsening dizziness Dry hacky cough You feel uneasy and you know something is not right.  Emmi education provided, "heart failure, adult". Please review and call and plan to discuss  at next assessment.  Heart failure self-management actions: Know signs and symptoms of congestive heart failure exacerbation. Know when to call the doctor to who to call. Verbalize how fluid intake can affect congestive heart failure. Eat healthy and monitor salt intake. Visit your primary care or cardiologist as scheduled. Verbalized how daily weights help with heart failure management.     .  Client verbalizes knowledge of Heart Attack self management skills within the next 6-9 months.   On track     Discussed chest tightness with client and encouraged  client to call provider. Client encouraged to call 911 if symptoms/signs worsens. Client acknowledges understanding.  Discussed importance of continued follow up with providers as scheduled and taking medications as prescribed. Upcoming appointment May 25, 2020. Notify provider for symptoms of chest pain, sweating, nausea/vomiting, irregular heartbeat, palpitations, rapid heart rate, shortness of breath, dizziness or fainting.  American Heart Association: if you have any of these signs, call 911: 1. Uncomfortable pressure, squeezing, fullness or pain in the center of your chest. It last more than a few minutes, or goes away and comes back. 2.  Pain or discomfort in one or both arms, the back, neck, jaw or stomach. 3.  Shortness of breath, with or without chest discomfort.  4.  Other signs such as breaking out in a cold sweat, nausea or lightheadedness.   Follow a low salt meal plan, limit or avoid drinks with alcohol.       .  Client will verbalize knowledge of self management of Hypertension as evidences by BP reading of 140/90 or less; or as defined by provider   On track     Client reports she does not have a blood pressure monitor, therefore she does not self monitor at home. Encouraged client to continue to take medications as prescribed. Discussed importance of taking medications. Continue to follow up with your doctor as scheduled. Ask your doctor "what is my target blood pressure range". Monitor your blood pressure and take results to your doctor's appointment.  Monitor the amount of salt you are eating. Continue to remain as active as possible per doctor recommendation. RN care coordinator provided blood pressure monitor:sent via mail.    .  COMPLETED: Decrease the use of hospital emergency department related to diabetes within the next year         No ED visits related to diabetes year 2020. It is important for you to continue see your providers as scheduled for recommended  labs, procedures.    .  Diabetes Patient stated goal "loose weight" (pt-stated)   Not on track     Discussed weight loss goal with client. Discussed Health coach benefit. Client is receptive. Send referral to [place] to eat low carbohydrate and low salt meals, watch portion sizes and avoid sugar sweetened drinks.  Increase physical activity as tolerated and as recommended by your doctor. Resending Emmi education: "weight loss tips". Please review and plan to discuss at next assessment.     Marland Kitchen  HEMOGLOBIN A1C < 7        Last A1C 9.3 on 05/03/20.  A1C 8.8 on 08/05/2019 Diabetes self management actions:  Glucose monitoring per provider recommendations  Eat Healthy-Plan to eat low carbohydrate and low salt meals, watch portion sizes and avoid sugar sweetened drinks.    Visit provider every 3-6 months as directed  Hbg A1C level every 3-6 months as directed.  Ask your doctor, "What is my Target A1C goal?"  Ask your doctor, "What is my Target blood sugar range?"  Take medications as prescribed.    .  Maintain timely refills of diabetic medication as prescribed within the year .   Not on track     Discussed medications with client. Client reports she is currently in the "donut hole" states unable to afford diabetic medications. She reports she is calling for a refill of Novulin N today, but does not know how much it will cost.  Referral to Pharmacist for medication assistance. Client encouraged to keep lines of communication open with primary care regarding her ability to obtain prescribed medications. Client encouraged to call Nurse care coordinator as needed.  RN Care Coordinator made a referral to the pharmacist for medications assistance.     .  COMPLETED: Obtain annual  Lipid Profile, LDL-C        Per chart, done 02/17/20.    Marland Kitchen  COMPLETED: Obtain Annual Eye (retinal)  Exam         Client reports done July 2021    .  Obtain Hemoglobin A1C at least 2  times per year   On track     Latest A1C 9.3 on 05/03/2020. 08/05/2019 A1C 8.8  It is important for you to continue to see your providers as scheduled for recommended labs.     .  Patient Stated: client will report chronic pain management strategies within the next 6 months.   On track     Discussed client's chronic pain. Reviewed medications with client. Encouraged client to follow up with provider if pain is not improved. Emmi education Provided, "chronic knee pain". Please review, call if you have any questions. Pharmacy referral referral regarding medications.    .  Visit Primary Care Provider or Endocrinologist at least 2 times per year    On track     Client reports she has not had a recent office visit with primary care, but has had telephone visit. It is important for you to continue to see your providers as scheduled for yearly exam, labs, prescription refills. Also call your provider for any questions or concerns about your health. Please call your doctor to schedule next visit per provider's recommendation. RN care coordinator confirmed with client that she called provider and informed provider of chest tightness. Client reports upcoming appointment scheduled for this Wednesday August 25th at 4:30. Client instructed to call provider or 911 if condition worsens.  RNCM also called provider to inform and update.      Plan: referral sent to pharmacist for medication assistance; referral sent to health coach re: weight loss goals/nutrition. Send blood pressure monitor. RNCM called provider office/Care coordination with primary care office as indicated. RNCM will send updated care plan to client; send updated care plan to primary care provider. RNCM will outreach per tier level within the next 6 months.   Thea Silversmith, RN, MSN, [place] [phone]

## 2020-05-23 NOTE — Patient Outreach (Signed)
Received a [place] referral.  I posted a referral ticket in the [facility] portal.    Ticket saved successfully with Tracking ID: [id number]

## 2020-05-25 ENCOUNTER — Other Ambulatory Visit: Payer: Self-pay

## 2020-05-25 NOTE — Patient Outreach (Signed)
°  [facility]) Care Management Chronic Special Needs Program    05/25/2020  Name: Whitney [name], DOB: 1956/09/12  MRN: [id number]   Ms. Whitney [name] is enrolled in a chronic special needs plan.  Care coordination: RNCM received call from [facility] pharmacist, Whitney [name] called to inform RNCM that he has addressed clients medication assistance needs.   Thea Silversmith, RN, MSN, [place] [phone]

## 2020-05-26 ENCOUNTER — Other Ambulatory Visit: Payer: HMO

## 2020-05-27 DIAGNOSIS — I10 Essential (primary) hypertension: Secondary | ICD-10-CM | POA: Diagnosis not present

## 2020-05-27 DIAGNOSIS — I5022 Chronic systolic (congestive) heart failure: Secondary | ICD-10-CM | POA: Diagnosis not present

## 2020-05-27 DIAGNOSIS — R0789 Other chest pain: Secondary | ICD-10-CM | POA: Diagnosis not present

## 2020-05-31 ENCOUNTER — Other Ambulatory Visit (HOSPITAL_COMMUNITY): Payer: Self-pay | Admitting: Family Medicine

## 2020-05-31 ENCOUNTER — Ambulatory Visit: Payer: HMO

## 2020-05-31 DIAGNOSIS — I5022 Chronic systolic (congestive) heart failure: Secondary | ICD-10-CM

## 2020-06-16 ENCOUNTER — Ambulatory Visit (HOSPITAL_COMMUNITY): Payer: HMO | Attending: Family Medicine

## 2020-06-16 ENCOUNTER — Other Ambulatory Visit: Payer: Self-pay

## 2020-06-16 DIAGNOSIS — I5022 Chronic systolic (congestive) heart failure: Secondary | ICD-10-CM | POA: Diagnosis not present

## 2020-06-16 LAB — ECHOCARDIOGRAM COMPLETE
AR max vel: 1.19 cm2
AV Area VTI: 1.33 cm2
AV Area mean vel: 1.29 cm2
AV Mean grad: 9 mmHg
AV Peak grad: 17.1 mmHg
Ao pk vel: 2.07 m/s
Area-P 1/2: 3.91 cm2
S' Lateral: 3.7 cm

## 2020-06-20 DIAGNOSIS — I10 Essential (primary) hypertension: Secondary | ICD-10-CM | POA: Diagnosis not present

## 2020-06-20 DIAGNOSIS — I5022 Chronic systolic (congestive) heart failure: Secondary | ICD-10-CM | POA: Diagnosis not present

## 2020-06-20 DIAGNOSIS — E1165 Type 2 diabetes mellitus with hyperglycemia: Secondary | ICD-10-CM | POA: Diagnosis not present

## 2020-06-22 NOTE — Progress Notes (Addendum)
Cardiology Office Note:    Date:  06/23/2020   ID:  Whitney [name], DOB December 08, 1955, MRN [id number]  PCP:  [name], MD  [facility] Cardiologist:  [name], MD  [facility] Electrophysiologist:  None   Referring MD: [name], MD    History of Present Illness:    Whitney [name] is a 64 y.o. female with a hx of systolic heart failure with recovered EF from 30-35% to 50% on most recent TTE on 06/16/20, history of respiratory arrest in 2013 which resulted in a cardiac arrest, non-obstructive CAD on cath 2006, hypertension, hyperlipidemia, diabetes mellitus on insulin, obstructive sleep apnea not on CPAP and morbid obesity who presents to clinic for  Patient was admitted to [facility] in September 2020 where she had presented with chest pain.  Discharge summary was reviewed.  During that admission, echo demonstrated LVEF 55-60% which was improved from prior imaging.  High-sensitivity troponin was negative x3, BNP was normal.  Given reassuring work-up, it was determined that her chest pain was likely noncardiac in etiology and the patient was discharged home.  Dr. Dorthy Cooler note dated 06/20/20 reviewed. Patient dropped 9 lbs since last visit in PCPs office. No signs of acute HF. BNP normal. Complained of some SOB with exertion but no symptoms at rest. BMI 57. Started on farxiga. A1C 9.1%.  Today, patient notes some intermittent chest tightness for about 3 weeks. Occurs when laying down. Lasts about 73mnutes and stops when she gets up. No chest tightness when walking. Has been taking medications. Weight down 9lbs. Has chronic venous stasis changes. Did not tolerate jardiance due to yeast infections. Could not take dulaglutide due to cost.   The patient was last seen by Dr. SMarlou Porchon 12/14/2015 in the office.  TTE on 12/08/2010 showed normal ejection fraction, mild aortic valve stenosis with sclerosis.  TEE on 01/16/2012 showed mild LVH, EF 40 to 45% with mild mitral  regurgitation, mild calcification of aortic valve, grade 2 diastolic dysfunction.  TTE on 06/16/2020 showed EF 50%, mild LVH, mild aortic stenosis with a mean gradient of 929mg  Nuc 2017 with EF 60% and a small defect of moderate severity present in the basal inferolateral and mid inferolateral location.  Consistent with prior myocardial infarction.   Past Medical History:  Diagnosis Date  . Arthritis    left knee,right hip  . Cardiac arrest ([facility]  . Depression   . Diabetes mellitus   . GERD (gastroesophageal reflux disease)   . Glaucoma   . H/O cardiac arrest 11/2011   PEA  . Heart murmur   . Hyperlipidemia   . Hypertension   . Myocardial infarction ([facility]  . Sleep apnea    Bring machine,mask and tubing    Past Surgical History:  Procedure Laterality Date  . BACK SURGERY    . COLONOSCOPY N/A 09/12/2016   Procedure: COLONOSCOPY;  Surgeon: MaClarene EssexMD;  Location: [facility];  Service: Endoscopy;  Laterality: N/A;  . CYSTOSCOPY W/ URETERAL STENT PLACEMENT Left 01/04/2013   Procedure: CYSTOSCOPY WITH RETROGRADE PYELOGRAM/URETERAL STENT PLACEMENT;  Surgeon: DaBernestine AmassMD;  Location: [facility] ORS;  Service: Urology;  Laterality: Left;  . CYSTOSCOPY WITH RETROGRADE PYELOGRAM, URETEROSCOPY AND STENT PLACEMENT Left 01/26/2013   Procedure: CYSTOSCOPY, JJ STENT REMOVAL, LEFT URETEROSCOPY, ;  Surgeon: DaBernestine AmassMD;  Location: [facility] ORS;  Service: Urology;  Laterality: Left;  CYSTO, JJ STENT REMOVAL, LEFT URETEROSCOPY   . FOOT SURGERY    . HERNIA  REPAIR    . HOLMIUM LASER APPLICATION Left 10/03/1592   Procedure: HOLMIUM LASER LITHOTRIPSY ;  Surgeon: Bernestine Amass, MD;  Location: [facility] OR[facility];  Service: Urology;  Laterality: Left;  . KNEE SURGERY     left  . PARTIAL HYSTERECTOMY      Current Medications: Current Meds  Medication Sig  . amLODipine (NORVASC) 5 MG tablet Take 1 tablet (5 mg total) by mouth daily.  . BD INSULIN SYRINGE U/F 31G X 5/16" 0.5 ML MISC as directed.   .  blood glucose meter kit and supplies KIT Dispense based on patient and insurance preference. Use up to four times daily as directed. (FOR ICD-9 250.00, 250.01).  . Chlorphen-Phenyleph-APAP (CORICIDIN D COLD/FLU/SINUS) 2-5-325 MG TABS Take 1 tablet by mouth every 6 (six) hours as needed (for congestion or cold-like symptoms).   . clobetasol ointment (TEMOVATE) 5.85 % Apply 1 application topically 2 (two) times daily as needed (as directed- avoid open wounds).   . docusate sodium (COLACE) 100 MG capsule Take 200 mg by mouth 2 (two) times daily as needed for mild constipation or moderate constipation.  . furosemide (LASIX) 20 MG tablet Take 1 tablet (20 mg total) by mouth daily.  Marland Kitchen gabapentin (NEURONTIN) 300 MG capsule Take 300 mg by mouth See admin instructions. Take 300 mg by mouth two times a day and an additional 300 mg once daily as needed for nerve pain  . Lancets (ONETOUCH DELICA PLUS [id number]) MISC USE TO TEST YOUR BLOOD SUGAR 2 TIMES A DAY  . latanoprost (XALATAN) 0.005 % ophthalmic solution 1 drop at bedtime.  Marland Kitchen lisinopril (ZESTRIL) 40 MG tablet Take 1 tablet (40 mg total) by mouth daily.  Marland Kitchen loratadine (CLARITIN) 10 MG tablet Take 10 mg by mouth daily.  . metoprolol succinate (TOPROL-XL) 100 MG 24 hr tablet Take 1 tablet (100 mg total) by mouth daily before breakfast. Take with or immediately following a meal.  . NOVOLIN N 100 UNIT/ML injection Inject 35 Units into the skin 2 (two) times daily after a meal. 35 units in the morning and 20 units at night time.  Marland Kitchen olopatadine (PATANOL) 0.1 % ophthalmic solution 1 drop 2 (two) times daily.   Glory Rosebush ULTRA test strip 2 (two) times daily.  Marland Kitchen oxybutynin (DITROPAN) 5 MG tablet Take 1 tablet (5 mg total) by mouth daily.  . pantoprazole (PROTONIX) 40 MG tablet Take 1 tablet (40 mg total) by mouth daily.  . pravastatin (PRAVACHOL) 40 MG tablet Take 1 tablet (40 mg total) by mouth at bedtime.  . traMADol (ULTRAM) 50 MG tablet Take 2 tablets (100 mg  total) by mouth every 8 (eight) hours as needed for severe pain.  . [DISCONTINUED] aspirin 325 MG tablet Take 1 tablet (325 mg total) by mouth at bedtime.  . [DISCONTINUED] Potassium Chloride ER 20 MEQ TBCR Take 1 tablet by mouth daily.     Allergies:   Contrast media [iodinated diagnostic agents]   Social History   Socioeconomic History  . Marital status: Divorced    Spouse name: Not on file  . Number of children: Not on file  . Years of education: Not on file  . Highest education level: Not on file  Occupational History  . Not on file  Tobacco Use  . Smoking status: Never Smoker  . Smokeless tobacco: Never Used  Vaping Use  . Vaping Use: Never used  Substance and Sexual Activity  . Alcohol use: No  . Drug use: No  . Sexual  activity: Never  Other Topics Concern  . Not on file  Social History Narrative  . Not on file   Social Determinants of Health   Financial Resource Strain:   . Difficulty of Paying Living Expenses: Not on file  Food Insecurity: No Food Insecurity  . Worried About Charity fundraiser in the Last Year: Never true  . Ran Out of Food in the Last Year: Never true  Transportation Needs: No Transportation Needs  . Lack of Transportation (Medical): No  . Lack of Transportation (Non-Medical): No  Physical Activity:   . Days of Exercise per Week: Not on file  . Minutes of Exercise per Session: Not on file  Stress:   . Feeling of Stress : Not on file  Social Connections:   . Frequency of Communication with Friends and Family: Not on file  . Frequency of Social Gatherings with Friends and Family: Not on file  . Attends Religious Services: Not on file  . Active Member of Clubs or Organizations: Not on file  . Attends Archivist Meetings: Not on file  . Marital Status: Not on file     Family History: The patient's family history includes Asthma in her mother.  ROS:   Please see the history of present illness.    Has some shortness of breath  with exertion. Chest tightness when laying flat that is relieved with exertion. Has chronic lower extremity edema. No nausea, vomiting, fevers, chills, abdominal pain, lightheadedness, dizziness, syncope.  EKGs/Labs/Other Studies Reviewed:    The following studies were reviewed today: Nuclear stress 12/26/2015:  Nuclear stress EF: 60%.  There was no ST segment deviation noted during stress.  Defect 1: There is a small defect of moderate severity present in the basal inferolateral and mid inferolateral location.  Findings consistent with prior myocardial infarction.  This is a low risk study.  The left ventricular ejection fraction is normal (55-65%).   TTE 06/26/19: 1. Left ventricular ejection fraction, by visual estimation, is 55 to  60%. The left ventricle has normal function. Moderately increased left  ventricular size. There is no left ventricular hypertrophy.  2. Definity contrast agent was given IV to delineate the left ventricular  endocardial borders.  3. Elevated mean left atrial pressure.  4. Left ventricular diastolic Doppler parameters are consistent with  impaired relaxation pattern of LV diastolic filling.  5. Global right ventricle has normal systolic function.The right  ventricular size is normal. No increase in right ventricular wall  thickness.  6. The mitral valve is normal in structure. Mild mitral valve  regurgitation.   TTE 06/06/20: 1. Left ventricular ejection fraction, by estimation, is 50%. The left  ventricle has mildly decreased function. The left ventricle demonstrates  global hypokinesis. There is mild left ventricular hypertrophy. Left  ventricular diastolic parameters are  consistent with Grade I diastolic dysfunction (impaired relaxation).  2. Right ventricular systolic function is normal. The right ventricular  size is normal. Tricuspid regurgitation signal is inadequate for assessing  PA pressure.  3. Left atrial size was mildly  dilated.  4. The mitral valve is normal in structure. No evidence of mitral valve  regurgitation. No evidence of mitral stenosis.  5. The aortic valve is tricuspid. Aortic valve regurgitation is not  visualized. Mild aortic valve stenosis. Aortic valve mean gradient  measures 9.0 mmHg (gradient not significantly elevated but visually at  least mild AS).  6. The inferior vena cava is dilated in size with >50% respiratory  variability,  suggesting right atrial pressure of 8 mmHg.  EKG:  EKG is ordered today.  The ekg ordered today demonstrates NSR with limb lead reversal  Recent Labs: 06/25/2019: B Natriuretic Peptide 65.6 06/26/2019: ALT 11; BUN 10; Creatinine, Ser 0.56; Hemoglobin 10.8; Magnesium 1.9; Platelets 249; Potassium 3.2; Sodium 141    Recent Lipid Panel No results found for: CHOL, TRIG, HDL, CHOLHDL, VLDL, LDLCALC, LDLDIRECT  Physical Exam:    VS:  BP 140/80   Pulse 65   Ht '5\' 3"'  (1.6 m)   Wt (!) 308 lb (139.7 kg)   SpO2 96%   BMI 54.56 kg/m     Wt Readings from Last 3 Encounters:  06/23/20 (!) 308 lb (139.7 kg)  07/14/19 295 lb (133.8 kg)  06/27/19 298 lb 1 oz (135.2 kg)     GEN: Obese, comfortable, NAD HEENT: Normal NECK: JVD difficult to assess due to body habitus CARDIAC: RRR, 2/6 SEM best heard at RUSB RESPIRATORY:  Clear to auscultation without rales, wheezing or rhonchi  ABDOMEN: Obese, soft, ND MUSCULOSKELETAL:  1+ edema with chronic venous stasis changes SKIN: Warm and dry NEUROLOGIC:  Alert and oriented x 3 PSYCHIATRIC:  Normal affect   ASSESSMENT:    1. Chronic systolic heart failure ([place])   2. OSA on CPAP   3. Hyperkalemia   4. Hypokalemia   5. Type 2 diabetes mellitus with complication, with long-term current use of insulin ([place])   6. Morbid obesity ([place])   7. Chest pain of uncertain etiology    PLAN:    In order of problems listed above:  # Chest Pain Occurs while laying down and improves when sitting up and walking around. Does  not sound cardiac in nature. Had similar symptoms in the past and work-up was reassuring.  -Nuc 2017 was reassuring -Cath 2006 with nonobstructive CAD -Symptoms non-exertional and improve with movement -Will continue to monitor and manage HF as below -Encourage weight loss as this may be contributing to symptoms  # Chronic systolic heart failure with recovered EF  EF originally 30-35% in the setting of a respiratory/cardiac arrest that improved to 50% on most recent TTE. No signs of fluid overload today. -Continue metoprolol 161m XL -Continue lisinopril 4105mdaily -Start spironolactone 2543maily -Continue lasix 67m61mily -Stop potassium supplement -Repeat BNP in 1 week after starting spironolactone -Decrease dose of ASA to 81mg1mly -Did not tolerate jardiance due to yeast infections and GLP-1 too expensive   # Diabetes Mellitus on Insulin Poorly controlled A1C 9.1 -Managed by PCP -Continue insulin  -Will look into coverage of GLP-1 as will aide with weight loss  # Morbid Obesity BMI 57. Lost 9lbs and continuing to try to lose weight. -Continue weight loss efforts -Discussed healthy diet and encouraged continued walking with her walker  # Hypertension -Will monitor on home BP cuff and keep log -Continue amlopdipine, lisinopril and metoprolol as above  # Hyperlipidemia LDL 115. Did not tolerate higher doses of statins due to muscle aches. -Continue pravastatin 40mg 60my  # Obstructive sleep apnea -Follow-up with Dr. TurnerRadford Paxs been lost to follow-up with sleep clinic and CPAP is no longer working -Patient was compliant with her old cpap machine with therapy set on 7 cm H20. Notably has not had a new cpap since 2013 and needs new one to treat underlying OSA.  Medication Adjustments/Labs and Tests Ordered: Current medicines are reviewed at length with the patient today.  Concerns regarding medicines are outlined above.  Orders Placed This  Encounter  Procedures  .  Basic metabolic panel  . Ambulatory referral to Cardiology   Meds ordered this encounter  Medications  . aspirin EC 81 MG tablet    Sig: Take 1 tablet (81 mg total) by mouth daily. Swallow whole.    Dispense:  90 tablet    Refill:  3  . spironolactone (ALDACTONE) 25 MG tablet    Sig: Take 1 tablet (25 mg total) by mouth daily.    Dispense:  90 tablet    Refill:  3    Patient Instructions  Medication Instructions:  Your physician has recommended you make the following change in your medication:   Stop your potassium supplement Decrease your aspirin to one baby aspirin per day, 81 mg Begin Spironolactone, one 92m tablet per day   Labwork: Your physician recommends that you [facility] for lab work [facility]:   [facility] you may come in any time of the day between 8-4:30pm   Testing/Procedures: None ordered.  Follow-Up: Your physician recommends that you schedule a follow-up appointment in:   3 months with Dr. PJohney Frame1st available new patient slot with Dr. TRadford Pax  Any Other Special Instructions Will Be Listed Below (If Applicable).     If you need a refill on your cardiac medications before your next appointment, please call your pharmacy.      Signed, HFreada Bergeron MD  06/23/2020 10:16 AM    [place]

## 2020-06-23 ENCOUNTER — Other Ambulatory Visit: Payer: Self-pay

## 2020-06-23 ENCOUNTER — Encounter: Payer: Self-pay | Admitting: Cardiology

## 2020-06-23 ENCOUNTER — Ambulatory Visit (INDEPENDENT_AMBULATORY_CARE_PROVIDER_SITE_OTHER): Payer: HMO | Admitting: Cardiology

## 2020-06-23 VITALS — BP 140/80 | HR 65 | Ht 63.0 in | Wt 308.0 lb

## 2020-06-23 DIAGNOSIS — I5022 Chronic systolic (congestive) heart failure: Secondary | ICD-10-CM

## 2020-06-23 DIAGNOSIS — E875 Hyperkalemia: Secondary | ICD-10-CM

## 2020-06-23 DIAGNOSIS — G4733 Obstructive sleep apnea (adult) (pediatric): Secondary | ICD-10-CM

## 2020-06-23 DIAGNOSIS — Z9989 Dependence on other enabling machines and devices: Secondary | ICD-10-CM

## 2020-06-23 DIAGNOSIS — E876 Hypokalemia: Secondary | ICD-10-CM | POA: Diagnosis not present

## 2020-06-23 DIAGNOSIS — Z794 Long term (current) use of insulin: Secondary | ICD-10-CM | POA: Diagnosis not present

## 2020-06-23 DIAGNOSIS — E118 Type 2 diabetes mellitus with unspecified complications: Secondary | ICD-10-CM | POA: Diagnosis not present

## 2020-06-23 DIAGNOSIS — R079 Chest pain, unspecified: Secondary | ICD-10-CM

## 2020-06-23 MED ORDER — SPIRONOLACTONE 25 MG PO TABS: 25.0000 mg | ORAL_TABLET | Freq: Every day | ORAL | 3 refills | Status: DC

## 2020-06-23 MED ORDER — ASPIRIN EC 81 MG PO TBEC: 81.0000 mg | DELAYED_RELEASE_TABLET | Freq: Every day | ORAL | 3 refills | Status: AC

## 2020-06-23 NOTE — Patient Instructions (Addendum)
Medication Instructions:  Your physician has recommended you make the following change in your medication:   Stop your potassium supplement Decrease your aspirin to one baby aspirin per day, 81 mg Begin Spironolactone, one 25mg  tablet per day   Labwork: Your physician recommends that you return for lab work in:   [facility] next Thursday 9/30- you may come in any time of the day between 8-4:30pm   Testing/Procedures: None ordered.  Follow-Up: Your physician recommends that you schedule a follow-up appointment in:   3 months with Dr. Johney Frame 1st available new patient slot with Dr. Radford Pax   Any Other Special Instructions Will Be Listed Below (If Applicable).     If you need a refill on your cardiac medications before your next appointment, please call your pharmacy.

## 2020-06-28 NOTE — Addendum Note (Signed)
Addended by: Maren Beach, Katherinne Mofield A on: 06/28/2020 05:16 PM   Modules accepted: Orders

## 2020-06-29 ENCOUNTER — Telehealth: Payer: Self-pay | Admitting: Cardiology

## 2020-06-29 NOTE — Telephone Encounter (Signed)
Patient states that she had a cpap machine but it broke. She states she was told she would not have to do another sleep study. Can we get her a cpap machine and then schedule with Dr. Radford Pax? Referral has been placed to see Dr. Radford Pax

## 2020-06-30 ENCOUNTER — Other Ambulatory Visit: Payer: Self-pay

## 2020-06-30 ENCOUNTER — Telehealth: Payer: Self-pay | Admitting: Cardiology

## 2020-06-30 ENCOUNTER — Other Ambulatory Visit: Payer: HMO | Admitting: *Deleted

## 2020-06-30 DIAGNOSIS — E876 Hypokalemia: Secondary | ICD-10-CM

## 2020-06-30 DIAGNOSIS — G4733 Obstructive sleep apnea (adult) (pediatric): Secondary | ICD-10-CM

## 2020-06-30 NOTE — Telephone Encounter (Signed)
Called patient and she states her machine broke in 2018 and her sleep study was done 12/2011. I did inform her that she may have to go back for a another sleep study considering the break in therapy. Denny Peon will contact dr Johney Frame to send a sleep study referral. Pt is aware and agreeable to treatment.

## 2020-06-30 NOTE — Telephone Encounter (Signed)
Order for sleep study placed as requested so pt may be referred to Dr Radford Pax for follow up r/t OSA.

## 2020-06-30 NOTE — Telephone Encounter (Signed)
Thank you so much! Really appreciate your help! -Nira Conn

## 2020-06-30 NOTE — Telephone Encounter (Signed)
Referral sent over from Dr. Johney Frame to schedule with Dr. Radford Pax for sleep. Per Gae Bon, patient has not had a sleep study since 2013 and will need a new order for a Split Night Study placed into Epic. Thank you!

## 2020-07-01 ENCOUNTER — Telehealth: Payer: Self-pay | Admitting: *Deleted

## 2020-07-01 LAB — BASIC METABOLIC PANEL
BUN/Creatinine Ratio: 16 (ref 12–28)
BUN: 12 mg/dL (ref 8–27)
CO2: 26 mmol/L (ref 20–29)
Calcium: 9.8 mg/dL (ref 8.7–10.3)
Chloride: 102 mmol/L (ref 96–106)
Creatinine, Ser: 0.77 mg/dL (ref 0.57–1.00)
GFR calc Af Amer: 93 mL/min/{1.73_m2} (ref >59)
GFR calc non Af Amer: 81 mL/min/{1.73_m2} (ref >59)
Glucose: 176 mg/dL — ABNORMAL HIGH (ref 65–99)
Potassium: 4.1 mmol/L (ref 3.5–5.2)
Sodium: 140 mmol/L (ref 134–144)

## 2020-07-01 NOTE — Telephone Encounter (Signed)
-----   Message from Thora Lance, RN sent at 06/30/2020 11:42 AM EDT ----- Regarding: sleep study Order placed as requested

## 2020-07-01 NOTE — Telephone Encounter (Signed)
PA request for sleep study submitted to [facility] via web portal.

## 2020-07-04 ENCOUNTER — Telehealth: Payer: Self-pay | Admitting: *Deleted

## 2020-07-04 NOTE — Telephone Encounter (Signed)
Anderson Malta with [facility] called requesting a last compliance report on patients cpap machine and clinicals stating  the patient brought the machine in for repairs to her dme and it could not be repaired. Anderson Malta states she will need this information by 07/06/20 or it will go up for denial review. Reached out to patient to ask who her dme is and what happened to her cpap machine but the patient could not talk at the time and and said she will call back tomorrow.

## 2020-07-04 NOTE — Telephone Encounter (Signed)
Received a call from Gae Bon stating she received a call from [facility] requesting a compliance report and notes saying machine could not be repaired. These records are needed by 07/06/20 or the request may be denied. I recommended that since the patient is not in [place] she will need to contact the patient to get the name of the [employer] company that she was using in hopes of getting the requested information. Patient will be taken out of the pool, as Gae Bon will be taking over this case.

## 2020-07-05 NOTE — Telephone Encounter (Signed)
Patient is returning call. She states her CPAP was distributed by [facility] can be contacted at [phone] or [phone]. She states their fax number is [phone]. Please call to further discuss.

## 2020-07-05 NOTE — Telephone Encounter (Signed)
Returned call lmtcb.

## 2020-07-06 NOTE — Addendum Note (Signed)
Addended by: Gwyndolyn Kaufman on: 07/06/2020 08:06 AM   Modules accepted: Orders

## 2020-07-06 NOTE — Telephone Encounter (Signed)
Returned call: Reached out to patient to tell her I spoke with Ashly at [employer] (acct. Rep) and he is working on getting her a new cpap unit through the [place] office. He found everything he needed in her chart and he will get the process started and will call me if anything else is needed.

## 2020-07-11 NOTE — Telephone Encounter (Signed)
Reached out to Whitney [name] at [facility] to ask the status on this patient and he states they are in the process of transferring the patient over to [facility] in [place] and they will contact the patient.

## 2020-07-14 NOTE — Telephone Encounter (Signed)
Appointment made for 08/02/20.

## 2020-07-14 NOTE — Telephone Encounter (Signed)
Per dr Radford Pax:  RE: Cpap settings of 7 cm H20 [name], Whitney Hong, MD  [name], CMA Needs followup wth me before I can order a new PAP device

## 2020-07-31 NOTE — Progress Notes (Signed)
Cardiology Office Note:         Virtual Visit via Telephone Note   This visit type was conducted due to national recommendations for restrictions regarding the COVID-19 Pandemic (e.g. social distancing) in an effort to limit this patient's exposure and mitigate transmission in our community.  Due to her co-morbid illnesses, this patient is at least at moderate risk for complications without adequate follow up.  This format is felt to be most appropriate for this patient at this time.  The patient did not have access to video technology/had technical difficulties with video requiring transitioning to audio format only (telephone).  All issues noted in this document were discussed and addressed.  No physical exam could be performed with this format.  Please refer to the patient's chart for her  consent to telehealth for [facility].    Date:  08/02/2020   ID:  Whitney [name], DOB [birth year], MRN [id number] The patient was identified using 2 identifiers.  Patient Location: Home Provider Location: Office/Clinic ate:  08/02/2020   ID:  Whitney [name], DOB 1955/10/10, MRN [id number]  PCP:  [name], MD  Cardiologist:  [name], MD    Referring MD: [name], MD   Chief Complaint  Patient presents with  . Sleep Apnea  . Hypertension    History of Present Illness:    Whitney [name] is a 64 y.o. female with a hx of OSA on CPAP, depression, DM, GERD, HTN who was referred to establish sleep care and get a new PAP device ordered.  He underwent sleep study in 2013 showing mild OSA with an AHI of 9.7/hr and underwent CPAP titration to 7cm H2O.    Unfortunately her PAP broke in 2018 and her DME had moved out of town and she never called to get another device so she is here today to get a new machine.  She tells me that she does not awaken snoring or gasping for breath.  She occasionally will feel tired when she gets up in the am.  She does nap some after going to church and  sometimes sitting in her chair.  She does occasionally has some HAs in the am but attributes that to sinus issues.  She does not get any exercise due to needing a walker to walk after a fall resulted in nerve damage.  Past Medical History:  Diagnosis Date  . Arthritis    left knee,right hip  . Cardiac arrest ([place])   . Depression   . Diabetes mellitus   . GERD (gastroesophageal reflux disease)   . Glaucoma   . H/O cardiac arrest 11/2011   PEA  . Heart murmur   . Hyperlipidemia   . Hypertension   . Myocardial infarction ([place]) 2013  . Sleep apnea    Bring machine,mask and tubing    Past Surgical History:  Procedure Laterality Date  . BACK SURGERY    . COLONOSCOPY N/A 09/12/2016   Procedure: COLONOSCOPY;  Surgeon: Clarene Essex, MD;  Location: [facility];  Service: Endoscopy;  Laterality: N/A;  . CYSTOSCOPY W/ URETERAL STENT PLACEMENT Left 01/04/2013   Procedure: CYSTOSCOPY WITH RETROGRADE PYELOGRAM/URETERAL STENT PLACEMENT;  Surgeon: Bernestine Amass, MD;  Location: [facility];  Service: Urology;  Laterality: Left;  . CYSTOSCOPY WITH RETROGRADE PYELOGRAM, URETEROSCOPY AND STENT PLACEMENT Left 01/26/2013   Procedure: CYSTOSCOPY, JJ STENT REMOVAL, LEFT URETEROSCOPY, ;  Surgeon: Bernestine Amass, MD;  Location: [facility];  Service: Urology;  Laterality: Left;  CYSTO, JJ STENT  REMOVAL, LEFT URETEROSCOPY   . FOOT SURGERY    . HERNIA REPAIR    . HOLMIUM LASER APPLICATION Left 0/16/0109   Procedure: HOLMIUM LASER LITHOTRIPSY ;  Surgeon: Bernestine Amass, MD;  Location: [facility];  Service: Urology;  Laterality: Left;  . KNEE SURGERY     left  . PARTIAL HYSTERECTOMY      Current Medications: Current Meds  Medication Sig  . amLODipine (NORVASC) 5 MG tablet Take 1 tablet (5 mg total) by mouth daily.  Marland Kitchen aspirin EC 81 MG tablet Take 1 tablet (81 mg total) by mouth daily. Swallow whole.  . BD INSULIN SYRINGE U/F 31G X 5/16" 0.5 ML MISC as directed.   . blood glucose meter kit and supplies KIT Dispense  based on patient and insurance preference. Use up to four times daily as directed. (FOR ICD-9 250.00, 250.01).  . Chlorphen-Phenyleph-APAP (CORICIDIN D COLD/FLU/SINUS) 2-5-325 MG TABS Take 1 tablet by mouth every 6 (six) hours as needed (for congestion or cold-like symptoms).   . clobetasol ointment (TEMOVATE) 3.23 % Apply 1 application topically 2 (two) times daily as needed (as directed- avoid open wounds).   . docusate sodium (COLACE) 100 MG capsule Take 200 mg by mouth 2 (two) times daily as needed for mild constipation or moderate constipation.  . furosemide (LASIX) 20 MG tablet Take 1 tablet (20 mg total) by mouth daily.  Marland Kitchen gabapentin (NEURONTIN) 300 MG capsule Take 300 mg by mouth See admin instructions. Take 300 mg by mouth two times a day and an additional 300 mg once daily as needed for nerve pain  . Lancets (ONETOUCH DELICA PLUS [id number]) MISC USE TO TEST YOUR BLOOD SUGAR 2 TIMES A DAY  . latanoprost (XALATAN) 0.005 % ophthalmic solution 1 drop at bedtime.  Marland Kitchen lisinopril (ZESTRIL) 40 MG tablet Take 1 tablet (40 mg total) by mouth daily.  Marland Kitchen loratadine (CLARITIN) 10 MG tablet Take 10 mg by mouth daily.  . metoprolol succinate (TOPROL-XL) 100 MG 24 hr tablet Take 1 tablet (100 mg total) by mouth daily before breakfast. Take with or immediately following a meal.  . NOVOLIN N 100 UNIT/ML injection Inject 35 Units into the skin 2 (two) times daily after a meal. 35 units in the morning and 20 units at night time.  Marland Kitchen olopatadine (PATANOL) 0.1 % ophthalmic solution 1 drop 2 (two) times daily.   Glory Rosebush ULTRA test strip 2 (two) times daily.  Marland Kitchen oxybutynin (DITROPAN) 5 MG tablet Take 1 tablet (5 mg total) by mouth daily.  . pantoprazole (PROTONIX) 40 MG tablet Take 1 tablet (40 mg total) by mouth daily.  . pravastatin (PRAVACHOL) 40 MG tablet Take 1 tablet (40 mg total) by mouth at bedtime.  Marland Kitchen spironolactone (ALDACTONE) 25 MG tablet Take 1 tablet (25 mg total) by mouth daily.  . traMADol (ULTRAM)  50 MG tablet Take 2 tablets (100 mg total) by mouth every 8 (eight) hours as needed for severe pain.     Allergies:   Contrast media [iodinated diagnostic agents]   Social History   Socioeconomic History  . Marital status: Divorced    Spouse name: Not on file  . Number of children: Not on file  . Years of education: Not on file  . Highest education level: Not on file  Occupational History  . Not on file  Tobacco Use  . Smoking status: Never Smoker  . Smokeless tobacco: Never Used  Vaping Use  . Vaping Use: Never used  Substance and  Sexual Activity  . Alcohol use: No  . Drug use: No  . Sexual activity: Never  Other Topics Concern  . Not on file  Social History Narrative  . Not on file   Social Determinants of Health   Financial Resource Strain:   . Difficulty of Paying Living Expenses: Not on file  Food Insecurity:   . Worried About Charity fundraiser in the Last Year: Not on file  . Ran Out of Food in the Last Year: Not on file  Transportation Needs:   . Lack of Transportation (Medical): Not on file  . Lack of Transportation (Non-Medical): Not on file  Physical Activity:   . Days of Exercise per Week: Not on file  . Minutes of Exercise per Session: Not on file  Stress:   . Feeling of Stress : Not on file  Social Connections:   . Frequency of Communication with Friends and Family: Not on file  . Frequency of Social Gatherings with Friends and Family: Not on file  . Attends Religious Services: Not on file  . Active Member of Clubs or Organizations: Not on file  . Attends Archivist Meetings: Not on file  . Marital Status: Not on file     Family History: The patient's family history includes Asthma in her mother.  ROS:   Please see the history of present illness.    ROS  All other systems reviewed and negative.   EKGs/Labs/Other Studies Reviewed:    The following studies were reviewed today: Sleep study 2013  EKG:  EKG is not ordered today.     Recent Labs: 06/30/2020: BUN 12; Creatinine, Ser 0.77; Potassium 4.1; Sodium 140   Recent Lipid Panel No results found for: CHOL, TRIG, HDL, CHOLHDL, VLDL, LDLCALC, LDLDIRECT   Physical Exam:    VS:  There were no vitals taken for this visit.    Wt Readings from Last 3 Encounters:  06/23/20 (!) 308 lb (139.7 kg)  07/14/19 295 lb (133.8 kg)  06/27/19 298 lb 1 oz (135.2 kg)      ASSESSMENT:    1. Obstructive sleep apnea   2. Essential (primary) hypertension    PLAN:    In order of problems listed above:  1.  OSA  -She needs a new PAP device but will need a split night sleep study before she can get a new device -I will order a split night sleep study  2.  HTN -continue Toprol XL 124m daily, amlodipine 554mdaily, Lisinopril 4060maily and spiro 74m47mily  COVID-19 Education: The signs and symptoms of COVID-19 were discussed with the patient and how to seek care for testing (follow up with PCP or arrange E-visit).  The importance of social distancing was discussed today.  Time:   Today, I have spent 20 minutes with the patient with telehealth technology discussing the above problems.  Medication Adjustments/Labs and Tests Ordered: Current medicines are reviewed at length with the patient today.  Concerns regarding medicines are outlined above.  No orders of the defined types were placed in this encounter.  No orders of the defined types were placed in this encounter.   Signed, TracFransico Him  08/02/2020 8:53 AM    [facility]

## 2020-08-01 NOTE — Telephone Encounter (Signed)
Patient is scheduled for lab study on 09/05/20. Patient understands her sleep study will be done at [facility] sleep lab. Patient understands she will receive a sleep packet in a week or so. Patient understands to call if she does not receive the sleep packet in a timely manner. Patient agrees with treatment and thanked me for call.

## 2020-08-02 ENCOUNTER — Other Ambulatory Visit: Payer: Self-pay

## 2020-08-02 ENCOUNTER — Telehealth (INDEPENDENT_AMBULATORY_CARE_PROVIDER_SITE_OTHER): Payer: HMO | Admitting: Cardiology

## 2020-08-02 ENCOUNTER — Encounter: Payer: Self-pay | Admitting: Cardiology

## 2020-08-02 DIAGNOSIS — I10 Essential (primary) hypertension: Secondary | ICD-10-CM | POA: Diagnosis not present

## 2020-08-02 DIAGNOSIS — G4733 Obstructive sleep apnea (adult) (pediatric): Secondary | ICD-10-CM

## 2020-08-31 DIAGNOSIS — E1169 Type 2 diabetes mellitus with other specified complication: Secondary | ICD-10-CM | POA: Diagnosis not present

## 2020-08-31 DIAGNOSIS — I5022 Chronic systolic (congestive) heart failure: Secondary | ICD-10-CM | POA: Diagnosis not present

## 2020-08-31 DIAGNOSIS — E7800 Pure hypercholesterolemia, unspecified: Secondary | ICD-10-CM | POA: Diagnosis not present

## 2020-09-05 ENCOUNTER — Other Ambulatory Visit: Payer: Self-pay

## 2020-09-05 ENCOUNTER — Ambulatory Visit (HOSPITAL_BASED_OUTPATIENT_CLINIC_OR_DEPARTMENT_OTHER): Payer: HMO | Attending: Cardiology | Admitting: Cardiology

## 2020-09-05 DIAGNOSIS — G4733 Obstructive sleep apnea (adult) (pediatric): Secondary | ICD-10-CM | POA: Diagnosis not present

## 2020-09-05 DIAGNOSIS — Z7982 Long term (current) use of aspirin: Secondary | ICD-10-CM | POA: Insufficient documentation

## 2020-09-05 DIAGNOSIS — Z79899 Other long term (current) drug therapy: Secondary | ICD-10-CM | POA: Insufficient documentation

## 2020-09-05 DIAGNOSIS — E119 Type 2 diabetes mellitus without complications: Secondary | ICD-10-CM | POA: Diagnosis not present

## 2020-09-05 DIAGNOSIS — I10 Essential (primary) hypertension: Secondary | ICD-10-CM | POA: Insufficient documentation

## 2020-09-12 NOTE — Procedures (Signed)
Patient Name: Whitney [name], Whitney [name] Date: 09/05/2020 Gender: Female D.O.B: [birth year] Age (years): 89 Referring Provider: Gwyndolyn Kaufman MD Height (inches): 63 Interpreting Physician: Fransico Him MD, ABSM Weight (lbs): 310 RPSGT: Jacolyn Reedy BMI: 33 MRN: [id number] Neck Size: 15.00  CLINICAL INFORMATION Sleep Study Type: Split Night CPAP  Indication for sleep study: Diabetes, Hypertension, OSA  SLEEP STUDY TECHNIQUE As per the AASM Manual for the Scoring of Sleep and Associated Events v2.3 (April 2016) with a hypopnea requiring 4% desaturations.  The channels recorded and monitored were frontal, central and occipital EEG, electrooculogram (EOG), submentalis EMG (chin), nasal and oral airflow, thoracic and abdominal wall motion, anterior tibialis EMG, snore microphone, electrocardiogram, and pulse oximetry. Continuous positive airway pressure (CPAP) was initiated when the patient met split night criteria and was titrated according to treat sleep-disordered breathing.  MEDICATIONS Medications self-administered by patient taken the night of the study : GABAPENTIN, PRAVASTATIN, ASPIRIN  RESPIRATORY PARAMETERS Diagnostic Total AHI (/hr): 44.8  RDI (/hr): 45.8  OA Index (/hr): -N/A CA Index (/hr): 0.0 REM AHI (/hr): N/A  NREM AHI (/hr):44.8  Supine AHI (/hr):44.8  Non-supine AHI (/hr):0 Min O2 Sat (%):82.0  Mean O2 (%): 92.4  Time below 88% (min):9.2   Titration Optimal Pressure (cm):16  AHI at Optimal Pressure (/hr):2.1  Min O2 at Optimal Pressure (%):84.0 Supine % at Optimal (%):100  Sleep % at Optimal (%):80   SLEEP ARCHITECTURE The recording time for the entire night was 392 minutes.  During a baseline period of 190.9 minutes, the patient slept for 128.5 minutes in REM and nonREM, yielding a sleep efficiency of 67.3%. Sleep onset after lights out was 23.2 minutes with a REM latency of N/A minutes. The patient spent 26.5% of the night in stage N1 sleep,  73.5% in stage N2 sleep, 0.0% in stage N3 and 0% in REM.  During the titration period of 199.1 minutes, the patient slept for 142.8 minutes in REM and nonREM, yielding a sleep efficiency of 71.7%. Sleep onset after CPAP initiation was 38.8 minutes with a REM latency of 42.0 minutes. The patient spent 7.4% of the night in stage N1 sleep, 51.0% in stage N2 sleep, 0.0% in stage N3 and 41.7% in REM.  CARDIAC DATA The 2 lead EKG demonstrated sinus rhythm. The mean heart rate was 100.0 beats per minute. Other EKG findings include: PVC.  LEG MOVEMENT DATA The total Periodic Limb Movements of Sleep (PLMS) were 0. The PLMS index was 0.0 .  IMPRESSIONS - Severe obstructive sleep apnea occurred during the diagnostic portion of the study (AHI = 44.8/hour). An optimal PAP pressure was selected for this patient ( 16 cm of water) - No significant central sleep apnea occurred during the diagnostic portion of the study (CAI = 0.0/hour). - Moderate oxygen desaturation was noted during the diagnostic portion of the study (Min O2 =82.0%). - The patient snored with moderate snoring volume during the diagnostic portion of the study. - PVCs were noted during this study. - Clinically significant periodic limb movements did not occur during sleep.  DIAGNOSIS - Obstructive Sleep Apnea (G47.33)  RECOMMENDATIONS - Trial of CPAP therapy on 16 cm H2O with a Medium size Fisher&Paykel Full Face Mask Simplus mask and heated humidification. - Avoid alcohol, sedatives and other CNS depressants that may worsen sleep apnea and disrupt normal sleep architecture. - Sleep hygiene should be reviewed to assess factors that may improve sleep quality. - Weight management and regular exercise should be initiated or continued. - Return to Sleep  Center for re-evaluation after 8 weeks of therapy - Recommend overnight pulse oximetry on CPAP.  [Electronically signed] 09/12/2020 02:12 PM  Fransico Him MD, ABSM Diplomate, American  Board of Sleep Medicine

## 2020-09-12 NOTE — Progress Notes (Deleted)
Cardiology Office Note:    Date:  09/12/2020   ID:  Whitney [name], DOB 26-Nov-1955, MRN [id number]  PCP:  Whitney Amel, MD  [facility] Cardiologist:  [name], MD  [facility] Electrophysiologist:  None   Referring MD: Whitney Amel, MD    History of Present Illness:    Whitney [name] is a 64 y.o. female with a hx of systolic heart failure with recovered EF from 30-35% to 50% on most recent TTE on 06/16/20, history of respiratory arrest in 2013 which resulted in a cardiac arrest, non-obstructive CAD on cath 2006, hypertension, hyperlipidemia, diabetes mellitus on insulin, obstructive sleep apnea not on CPAP and morbid obesity who presents to clinic for follow-up.  Patient was admitted to [facility] in 06/2019 where she had presented with chest pain. During that admission, echo demonstrated LVEF 55-60% which was improved from prior imaging.  High-sensitivity troponin was negative x3, BNP was normal.  Given reassuring work-up, it was determined that her chest pain was likely noncardiac in etiology and the patient was discharged home.  TTE on 12/08/2010 showed normal ejection fraction, mild aortic valve stenosis with sclerosis.  TEE on 01/16/2012 showed mild LVH, EF 40 to 45% with mild mitral regurgitation, mild calcification of aortic valve, grade 2 diastolic dysfunction.  TTE on 06/16/2020 showed EF 50%, mild LVH, mild aortic stenosis with a mean gradient of 67mmHg  Nuc 2017 with EF 60% and a small defect of moderate severity present in the basal inferolateral and mid inferolateral location.  Consistent with prior myocardial infarction.  Past Medical History:  Diagnosis Date  . Arthritis    left knee,right hip  . Cardiac arrest ([place])   . Depression   . Diabetes mellitus   . GERD (gastroesophageal reflux disease)   . Glaucoma   . H/O cardiac arrest 11/2011   PEA  . Heart murmur   . Hyperlipidemia   . Hypertension   . Myocardial infarction ([place]) 2013  .  Sleep apnea    Bring machine,mask and tubing    Past Surgical History:  Procedure Laterality Date  . BACK SURGERY    . COLONOSCOPY N/A 09/12/2016   Procedure: COLONOSCOPY;  Surgeon: Whitney Essex, MD;  [facility]: [facility];  Service: Endoscopy;  Laterality: N/A;  . CYSTOSCOPY W/ URETERAL STENT PLACEMENT Left 01/04/2013   Procedure: CYSTOSCOPY WITH RETROGRADE PYELOGRAM/URETERAL STENT PLACEMENT;  Surgeon: Whitney Amass, MD;  Location: [facility];  Service: Urology;  Laterality: Left;  . CYSTOSCOPY WITH RETROGRADE PYELOGRAM, URETEROSCOPY AND STENT PLACEMENT Left 01/26/2013   Procedure: CYSTOSCOPY, JJ STENT REMOVAL, LEFT URETEROSCOPY, ;  Surgeon: Whitney Amass, MD;  Location: [facility];  Service: Urology;  Laterality: Left;  CYSTO, JJ STENT REMOVAL, LEFT URETEROSCOPY   . FOOT SURGERY    . HERNIA REPAIR    . HOLMIUM LASER APPLICATION Left 3/33/5456   Procedure: HOLMIUM LASER LITHOTRIPSY ;  Surgeon: Whitney Amass, MD;  Location: [facility];  Service: Urology;  Laterality: Left;  . KNEE SURGERY     left  . PARTIAL HYSTERECTOMY      Current Medications: No outpatient medications have been marked as taking for the 09/15/20 encounter (Appointment) with Whitney Bergeron, MD.     Allergies:   Contrast media [iodinated diagnostic agents]   Social History   Socioeconomic History  . Marital status: Divorced    Spouse name: Not on file  . Number of children: Not on file  . Years of education: Not on file  .  Highest education level: Not on file  Occupational History  . Not on file  Tobacco Use  . Smoking status: Never Smoker  . Smokeless tobacco: Never Used  Vaping Use  . Vaping Use: Never used  Substance and Sexual Activity  . Alcohol use: No  . Drug use: No  . Sexual activity: Never  Other Topics Concern  . Not on file  Social History Narrative  . Not on file   Social Determinants of Health   Financial Resource Strain: Not on file  Food Insecurity: Not on file  Transportation Needs:  Not on file  Physical Activity: Not on file  Stress: Not on file  Social Connections: Not on file     Family History: The patient's ***family history includes Asthma in her mother.  ROS:   Please see the history of present illness.    *** All other systems reviewed and are negative.  EKGs/Labs/Other Studies Reviewed:    The following studies were reviewed today: Nuclear stress 12/26/2015:  Nuclear stress EF: 60%.  There was no ST segment deviation noted during stress.  Defect 1: There is a small defect of moderate severity present in the basal inferolateral and mid inferolateral location.  Findings consistent with prior myocardial infarction.  This is a low risk study.  The left ventricular ejection fraction is normal (55-65%).   TTE 06/26/19: 1. Left ventricular ejection fraction, by visual estimation, is 55 to  60%. The left ventricle has normal function. Moderately increased left  ventricular size. There is no left ventricular hypertrophy.  2. Definity contrast agent was given IV to delineate the left ventricular  endocardial borders.  3. Elevated mean left atrial pressure.  4. Left ventricular diastolic Doppler parameters are consistent with  impaired relaxation pattern of LV diastolic filling.  5. Global right ventricle has normal systolic function.The right  ventricular size is normal. No increase in right ventricular wall  thickness.  6. The mitral valve is normal in structure. Mild mitral valve  regurgitation.   TTE 06/06/20: 1. Left ventricular ejection fraction, by estimation, is 50%. The left  ventricle has mildly decreased function. The left ventricle demonstrates  global hypokinesis. There is mild left ventricular hypertrophy. Left  ventricular diastolic parameters are  consistent with Grade I diastolic dysfunction (impaired relaxation).  2. Right ventricular systolic function is normal. The right ventricular  size is normal. Tricuspid  regurgitation signal is inadequate for assessing  PA pressure.  3. Left atrial size was mildly dilated.  4. The mitral valve is normal in structure. No evidence of mitral valve  regurgitation. No evidence of mitral stenosis.  5. The aortic valve is tricuspid. Aortic valve regurgitation is not  visualized. Mild aortic valve stenosis. Aortic valve mean gradient  measures 9.0 mmHg (gradient not significantly elevated but visually at  least mild AS).  6. The inferior vena cava is dilated in size with >50% respiratory  variability, suggesting right atrial pressure of 8 mmHg.  EKG:  EKG is *** ordered today.  The ekg ordered today demonstrates ***  Recent Labs: 06/30/2020: BUN 12; Creatinine, Ser 0.77; Potassium 4.1; Sodium 140  Recent Lipid Panel No results found for: CHOL, TRIG, HDL, CHOLHDL, VLDL, LDLCALC, LDLDIRECT   Risk Assessment/Calculations:   {Does this patient have ATRIAL FIBRILLATION?:[id number]}   Physical Exam:    VS:  There were no vitals taken for this visit.    Wt Readings from Last 3 Encounters:  09/05/20 (!) 310 lb (140.6 kg)  06/23/20 (!) 308 lb (  139.7 kg)  07/14/19 295 lb (133.8 kg)     GEN: *** Well nourished, well developed in no acute distress HEENT: Normal NECK: No JVD; No carotid bruits LYMPHATICS: No lymphadenopathy CARDIAC: ***RRR, no murmurs, rubs, gallops RESPIRATORY:  Clear to auscultation without rales, wheezing or rhonchi  ABDOMEN: Soft, non-tender, non-distended MUSCULOSKELETAL:  No edema; No deformity  SKIN: Warm and dry NEUROLOGIC:  Alert and oriented x 3 PSYCHIATRIC:  Normal affect   ASSESSMENT:    No diagnosis found. PLAN:    In order of problems listed above:  # Chest Pain Occurs while laying down and improves when sitting up and walking around. Does not sound cardiac in nature. Had similar symptoms in the past and work-up was reassuring.  -Nuc 2017 was reassuring -Cath 2006 with nonobstructive CAD -Symptoms non-exertional  and improve with movement -Will continue to monitor and manage HF as below -Encourage weight loss as this may be contributing to symptoms  # Chronic systolic heart failure with recovered EF  EF originally 30-35% in the setting of a respiratory/cardiac arrest that improved to 50% on most recent TTE. No signs of fluid overload today. -Continue metoprolol 100mg  XL -Continue lisinopril 40mg  daily -Continue spironolactone 25mg  daily -Continue lasix 20mg  daily -Stopped potassium supplement -Continue ASA 81mg  daily -Did not tolerate jardiance due to yeast infections and GLP-1 too expensive  # Diabetes Mellitus on Insulin Poorly controlled A1C 9.1 -Managed by PCP -Continue insulin   # Morbid Obesity BMI 57. Lost 9lbs and continuing to try to lose weight. -Continue weight loss efforts -Discussed healthy diet and encouraged continued walking with her walker  # Hypertension -Will monitor on home BP cuff and keep log -Continue amlopdipine, lisinopril and metoprolol as above  # Hyperlipidemia LDL 115. Did not tolerate higher doses of statins due to muscle aches. -Continue pravastatin 40mg  daily  # Obstructive sleep apnea -Continue CPAP  {Are you ordering a CV Procedure (e.g. stress test, cath, DCCV, TEE, etc)?   Press F2        :[id number]}    Medication Adjustments/Labs and Tests Ordered: Current medicines are reviewed at length with the patient today.  Concerns regarding medicines are outlined above.  No orders of the defined types were placed in this encounter.  No orders of the defined types were placed in this encounter.   There are no Patient Instructions on file for this visit.   Signed, Whitney Bergeron, MD  09/12/2020 1:33 PM    [facility]

## 2020-09-13 ENCOUNTER — Other Ambulatory Visit: Payer: Self-pay

## 2020-09-13 NOTE — Patient Outreach (Signed)
  [facility]) Care Management Chronic Special Needs Program    09/13/2020  Name: Whitney [name], DOB: July 28, 1956  MRN: [id number]   Ms. Whitney [name] is enrolled in a chronic special needs plan for Diabetes. [facility] will continue to provide services for this member through 09/30/2020. [facility] Care Management Team will assume care 10/01/2020.   Thea Silversmith, RN, MSN, [place] [phone]

## 2020-09-14 ENCOUNTER — Telehealth: Payer: Self-pay | Admitting: *Deleted

## 2020-09-14 DIAGNOSIS — G4733 Obstructive sleep apnea (adult) (pediatric): Secondary | ICD-10-CM

## 2020-09-14 NOTE — Telephone Encounter (Signed)
Informed patient of sleep study results and patient understanding was verbalized. Patient understands her sleep study showed  they have sleep apnea and had successful CPAP titration. Please set overnightly pulse ox on CPAP and followup with me in 8 weeks             Upon patient request DME selection is [facility]. Patient understands she/he will be contacted by [facility] to set up her/he cpap. Patient understands to call if [facility] does not contact her/he with new setup in a timely manner. Patient understands they will be called once confirmation has been received from [facility] that they have received their new machine to schedule 10 week follow up appointment.   [facility] notified of new cpap order  Please add to airview Patient was grateful for the call and thanked me.

## 2020-09-14 NOTE — Telephone Encounter (Signed)
-----   Message from Sueanne Margarita, MD sent at 09/12/2020  2:16 PM EST ----- Please let patient know that they have sleep apnea and had successful CPAP titration. Please set overnightly pulse ox on CPAP and followup with me in 8 weeks

## 2020-09-15 ENCOUNTER — Ambulatory Visit: Payer: HMO | Admitting: Cardiology

## 2020-09-18 NOTE — Progress Notes (Signed)
Cardiology Office Note:    Date:  09/19/2020   ID:  Whitney [name], DOB 09/02/1956, MRN [id number]  PCP:  Whitney Amel, MD  [facility] Cardiologist:  [name], MD  [facility] Electrophysiologist:  None  Sleep Medicine:  Whitney Him, MD   Referring MD: Whitney Amel, MD   Chief Complaint:  Follow-up (CHF)    Patient Profile:    Whitney [name] is a 64 y.o. female with:   Heart failure with improved ejection fraction   Non-ischemic cardiomyopathy   Echocardiogram 2/13: EF 30-35 (in setting of resp/cardiac arrest)   Echocardiogram 06/2019: EF 50  Echocardiogram 9/21: EF 50  Hx of respiratory arrest >> PEA in 2013  Cath 2006: no CAD    Hypertension   Hyperlipidemia   Myalgias with higher dose statins  Diabetes mellitus, insulin dependent   OSA, CPAP   Morbid obesity   Mild aortic stenosis (Echocardiogram 9/21)   Prior CV studies: Echocardiogram 06/16/20 EF 50, global HK, mild LVH, Gr 1 DD, normal RVSF, mild LAE, mild AS (mean 9 mmHg, visually appears to be mild AS)  Echocardiogram 06/26/2019 EF 55-60, mod LVH, Gr 1 DD, normal RVSF  Myoview 12/27/15 EF 60, inf-lat scar, no ischemia low risk   Echocardiogram 11/06/11 EF 30-35  Cardiac catheterization 08/20/2005 EF 50-55 Normal coronary arteries  History of Present Illness:    Ms. Whitney [name] established with Dr. Johney [name] in 9/21.  She had symptoms of chest pain that were not felt to be cardiac in nature and no further testing was indicated.  Spironolactone was added to her medical regimen for her CHF.  She returns for f/u.  She is here alone.  Since last seen, she is feeling better.  She notes that her chest symptoms are improved on Spironolactone.  Her breathing is stable.  She sleeps on an incline chronically.  She notes increased leg edema.  She has not had syncope.        Past Medical History:  Diagnosis Date  . Arthritis    left knee,right hip  . Cardiac arrest (Whitney [name])   . Depression    . Diabetes mellitus   . GERD (gastroesophageal reflux disease)   . Glaucoma   . H/O cardiac arrest 11/2011   PEA  . Heart murmur   . Hyperlipidemia   . Hypertension   . Myocardial infarction (Whitney [name]) 2013  . Sleep apnea    Bring machine,mask and tubing    Current Medications: Current Meds  Medication Sig  . amLODipine (NORVASC) 5 MG tablet Take 1 tablet (5 mg total) by mouth daily.  Marland Kitchen aspirin EC 81 MG tablet Take 1 tablet (81 mg total) by mouth daily. Swallow whole.  . BD INSULIN SYRINGE U/F 31G X 5/16" 0.5 ML MISC as directed.   . blood glucose meter kit and supplies KIT Dispense based on patient and insurance preference. Use up to four times daily as directed. (FOR ICD-9 250.00, 250.01).  . Chlorphen-Phenyleph-APAP (CORICIDIN D COLD/FLU/SINUS) 2-5-325 MG TABS Take 1 tablet by mouth every 6 (six) hours as needed (for congestion or cold-like symptoms).   . clobetasol ointment (TEMOVATE) 3.00 % Apply 1 application topically 2 (two) times daily as needed (as directed- avoid open wounds).   . furosemide (LASIX) 20 MG tablet Take 1 tablet (20 mg total) by mouth daily.  Marland Kitchen gabapentin (NEURONTIN) 300 MG capsule Take 300 mg by mouth See admin instructions. Take 300 mg by mouth two times a day and an additional 300  mg once daily as needed for nerve pain  . Lancets (ONETOUCH DELICA PLUS [id number]) MISC USE TO TEST YOUR BLOOD SUGAR 2 TIMES A DAY  . latanoprost (XALATAN) 0.005 % ophthalmic solution 1 drop at bedtime.  Marland Kitchen lisinopril (ZESTRIL) 40 MG tablet Take 1 tablet (40 mg total) by mouth daily.  Marland Kitchen loratadine (CLARITIN) 10 MG tablet Take 10 mg by mouth daily.  . metoprolol succinate (TOPROL-XL) 100 MG 24 hr tablet Take 1 tablet (100 mg total) by mouth daily before breakfast. Take with or immediately following a meal.  . NOVOLIN N 100 UNIT/ML injection Inject 35 Units into the skin 2 (two) times daily after a meal. 35 units in the morning and 25 units at night time.  Marland Kitchen olopatadine (PATANOL) 0.1 %  ophthalmic solution Place 1 drop into both eyes 2 (two) times daily.  Glory Rosebush ULTRA test strip 2 (two) times daily.  Marland Kitchen oxybutynin (DITROPAN) 5 MG tablet Take 1 tablet (5 mg total) by mouth daily.  . pantoprazole (PROTONIX) 40 MG tablet Take 1 tablet (40 mg total) by mouth daily.  . pravastatin (PRAVACHOL) 40 MG tablet Take 1 tablet (40 mg total) by mouth at bedtime.  Marland Kitchen spironolactone (ALDACTONE) 25 MG tablet Take 1 tablet (25 mg total) by mouth daily.  . traMADol (ULTRAM) 50 MG tablet Take 2 tablets (100 mg total) by mouth every 8 (eight) hours as needed for severe pain.     Allergies:   Contrast media [iodinated diagnostic agents]   Social History   Tobacco Use  . Smoking status: Never Smoker  . Smokeless tobacco: Never Used  Vaping Use  . Vaping Use: Never used  Substance Use Topics  . Alcohol use: No  . Drug use: No     Family Hx: The patient's family history includes Asthma in her mother.  ROS   EKGs/Labs/Other Test Reviewed:    EKG:  EKG is not ordered today.  The ekg ordered today demonstrates n/a  Recent Labs: 06/30/2020: BUN 12; Creatinine, Ser 0.77; Potassium 4.1; Sodium 140   Recent Lipid Panel No results found for: CHOL, TRIG, HDL, CHOLHDL, LDLCALC, LDLDIRECT    Risk Assessment/Calculations:      Physical Exam:    VS:  BP 112/60   Pulse 62   Ht _0  (1.6 m)   Wt (!) 313 lb (142 kg)   SpO2 97%   BMI 55.45 kg/m     Wt Readings from Last 3 Encounters:  09/19/20 (!) 313 lb (142 kg)  09/05/20 (!) 310 lb (140.6 kg)  06/23/20 (!) 308 lb (139.7 kg)     Constitutional:      Appearance: Healthy appearance. Not in distress.  Neck:     Vascular: No JVR.  Pulmonary:     Effort: Pulmonary effort is normal.     Breath sounds: No wheezing. No rales.  Cardiovascular:     Normal rate. Regular rhythm. Normal S1. Normal S2.     Murmurs: There is a grade 2/6 crescendo-decrescendo systolic murmur at the URSB.  Edema:    Pretibial: trace edema of the left  pretibial area and 1+ edema of the right pretibial area. Abdominal:     Palpations: Abdomen is soft.  Skin:    General: Skin is warm and dry.  Neurological:     Mental Status: Alert and oriented to person, place and time.     Cranial Nerves: Cranial nerves are intact.       ASSESSMENT & PLAN:  1. HFimpEF (Heart Failure with improved EF) EF was previously 30-35 when she presented with respiratory arrest and cardiac arrest in 2013.  This has improved to normal.  Most recent echocardiogram in September 2021 with EF 29% and mild diastolic dysfunction.  She did note some atypical chest discomfort when last seen.  She notes significant improvement since starting on spironolactone.  She ambulates with a walker.  She notes no significant change in her breathing.  Her leg edema has worsened recently.  She does admit to increased salt in her diet.  Continue current dose of metoprolol succinate, lisinopril, spironolactone.  I will increase her furosemide to 40 mg daily x3 days.  She can resume furosemide 20 mg daily after this.  I have asked her to contact us if she feels better on the higher dose of furosemide.  If so, we can continue this and obtain follow-up labs to recheck her potassium and creatinine.  Otherwise, follow-up in 3 months with Dr. Johney [name] or me.  She knows to contact us for sooner f/u if she has worsening chest symptoms.    2. OSA on CPAP Continue follow-up with Dr. Radford Pax as planned.  3. Essential (primary) hypertension The patient's blood pressure is controlled on her current regimen.  Continue current therapy.   4. Pure hypercholesterolemia She has had difficulty tolerating higher dose statins.  If her LDL remains above 100, we could consider adding ezetimibe to her current medical regimen.    Dispo:  Return in about 3 months (around 12/18/2020) for Routine Follow Up, w/ Dr. Johney [name], or [name], PA-C, in person.   Medication Adjustments/Labs and Tests Ordered: Current  medicines are reviewed at length with the patient today.  Concerns regarding medicines are outlined above.  Tests Ordered: No orders of the defined types were placed in this encounter.  Medication Changes: No orders of the defined types were placed in this encounter.   Signed, [name], PA-C  09/19/2020 4:48 PM    [facility] [place], [place], [place]  [address] Phone: [phone]; Fax: [phone]

## 2020-09-19 ENCOUNTER — Ambulatory Visit (INDEPENDENT_AMBULATORY_CARE_PROVIDER_SITE_OTHER): Payer: HMO | Admitting: Physician Assistant

## 2020-09-19 ENCOUNTER — Other Ambulatory Visit: Payer: Self-pay

## 2020-09-19 ENCOUNTER — Encounter: Payer: Self-pay | Admitting: Physician Assistant

## 2020-09-19 VITALS — BP 112/60 | HR 62 | Ht 63.0 in | Wt 313.0 lb

## 2020-09-19 DIAGNOSIS — G4733 Obstructive sleep apnea (adult) (pediatric): Secondary | ICD-10-CM | POA: Diagnosis not present

## 2020-09-19 DIAGNOSIS — I10 Essential (primary) hypertension: Secondary | ICD-10-CM | POA: Diagnosis not present

## 2020-09-19 DIAGNOSIS — I5020 Unspecified systolic (congestive) heart failure: Secondary | ICD-10-CM | POA: Diagnosis not present

## 2020-09-19 DIAGNOSIS — Z9989 Dependence on other enabling machines and devices: Secondary | ICD-10-CM

## 2020-09-19 DIAGNOSIS — E7800 Pure hypercholesterolemia, unspecified: Secondary | ICD-10-CM | POA: Diagnosis not present

## 2020-09-19 NOTE — Patient Instructions (Signed)
Medication Instructions:  Your physician has recommended you make the following change in your medication:   1) Increase Furosemide to 40 mg once a day for 3 days, then decrease to 20 mg once a day  *If you need a refill on your cardiac medications before your next appointment, please call your pharmacy*  Lab Work: None ordered today  If you have labs (blood work) drawn today and your tests are completely normal, you will receive your results only by:  [place] (if you have MyChart) OR  [name] in the mail If you have any lab test that is abnormal or we need to change your treatment, we will call you to review the results.  Testing/Procedures: None ordered today  Follow-Up: On 12/19/2020 at 10:20AM with Gwyndolyn Kaufman, MD  Other Instructions If you feel better on the Lasix 40 mg, call the office to arrange lab work

## 2020-10-05 ENCOUNTER — Other Ambulatory Visit: Payer: Self-pay

## 2020-10-17 NOTE — Telephone Encounter (Signed)
Patient called and wanted someone to go over her sleep study results again. Please call patient

## 2020-10-19 ENCOUNTER — Telehealth: Payer: Self-pay | Admitting: Cardiology

## 2020-10-19 NOTE — Telephone Encounter (Signed)
Called patient and went over results again.

## 2020-10-19 NOTE — Telephone Encounter (Signed)
Patient called upset stating that she has not heard from anyone in the sleep clinic. Please call

## 2020-10-19 NOTE — Addendum Note (Signed)
Addended by: [name] on: 10/19/2020 06:20 PM   Modules accepted: Orders

## 2020-10-26 NOTE — Telephone Encounter (Addendum)
Informed patient of sleep study results and patient understanding was verbalized. Patient understands her sleep study showed: IMPRESSIONS - Severe obstructive sleep apnea occurred during the diagnostic portion of the study (AHI = 44.8/hour). An optimal PAP pressure was selected for this patient ( 16 cm of water) Moderate oxygen desaturation was noted during the diagnostic portion of the study (Min O2 =82.0%). - The patient snored with moderate snoring volume during the diagnostic portion of the study. - PVCs were noted during this study.  Upon patient request DME selection is [facility]. Patient understands she/he will be contacted by [facility] to set up her/he cpap. Patient understands to call if [facility] does not contact her/he with new setup in a timely manner. Patient understands they will be called once confirmation has been received from [facility] that they have received their new machine to schedule 10 week follow up appointment.  [facility]Med CPAP therapy on 16 cm H2O with a Medium size [employer] Full Face Mask Simplus mask and heated humidification.  Over night pulse ox on CPAP   [facility] notified of new cpap order  Please add to airview Patient was grateful for the call and thanked me.

## 2020-12-17 NOTE — Progress Notes (Signed)
Cardiology Office Note:    Date:  12/19/2020   ID:  Whitney [name], DOB 25-Jul-1956, MRN [id number]  PCP:  [name], [place]  Cardiologist:  [name], MD  Advanced Practice Provider:  No care team member to display Electrophysiologist:  None    Referring MD: [name], MD     History of Present Illness:    Whitney [name] is a 65 y.o. female with a hx of systolic heart failure with recovered EF from 30-35% to 50% on most recent TTE on 06/16/20, history of respiratory arrest in 2013 which resulted in a cardiac arrest, non-obstructive CAD on cath 2006, hypertension, hyperlipidemia, diabetes mellitus on insulin, obstructive sleep apnea on CPAP and morbid obesity who presents to clinic for follow-up.  Patient was admitted to [facility] in September 2020 where she had presented with chest pain. During that admission, echo demonstrated LVEF 55-60% which was improved from prior imaging.  High-sensitivity troponin was negative x3, BNP was normal.  Given reassuring work-up, it was determined that her chest pain was likely noncardiac in etiology and the patient was discharged home.  Last saw [name], PA on 09/19/20 where she was feeling better since starting spironolactone. She was having LE edema at that time and her lasix was increased to 73m x3 doses and then 240mdaily.  Today, the patient states that she is overall doing well. She has started a supplement for weight loss which she is wondering if it is safe for her health (lipozene). She has noticed that her appetite has decreased since starting it. She has been feeling more fatigued but started CPAP and this has helped. Chest tightness has also resolved since starting it. Continues to have DOE is chronic. LE edema is mild today as [facility] Blood pressure is controlled at home.   Past Medical History:  Diagnosis Date  . Arthritis    left  knee,right hip  . [place]  . Depression   . Diabetes mellitus   . GERD (gastroesophageal reflux disease)   . Glaucoma   . H/O cardiac arrest 11/2011   PEA  . Heart murmur   . Hyperlipidemia   . Hypertension   . Myocardial infarction ([facility]  . Sleep apnea    Bring machine,mask and tubing    Past Surgical History:  Procedure Laterality Date  . BACK SURGERY    . COLONOSCOPY N/A 09/12/2016   Procedure: COLONOSCOPY;  Surgeon: MaClarene EssexMD;  Location: [facility];  Service: Endoscopy;  Laterality: N/A;  . CYSTOSCOPY W/ URETERAL STENT PLACEMENT Left 01/04/2013   Procedure: CYSTOSCOPY WITH RETROGRADE PYELOGRAM/URETERAL STENT PLACEMENT;  Surgeon: DaBernestine AmassMD;  Location: [facility] ORS;  Service: Urology;  Laterality: Left;  . CYSTOSCOPY WITH RETROGRADE PYELOGRAM, URETEROSCOPY AND STENT PLACEMENT Left 01/26/2013   Procedure: CYSTOSCOPY, JJ STENT REMOVAL, LEFT URETEROSCOPY, ;  Surgeon: DaBernestine AmassMD;  Location: [facility]S;  Service: Urology;  Laterality: Left;  CYSTO, JJ STENT REMOVAL, LEFT URETEROSCOPY   . FOOT SURGERY    . HERNIA REPAIR    . HOLMIUM LASER APPLICATION Left 12/31/14/5790 Procedure: HOLMIUM LASER LITHOTRIPSY ;  Surgeon: DaBernestine AmassMD;  Location: [facility];  Service: Urology;  Laterality: Left;  . KNEE SURGERY     left  . PARTIAL HYSTERECTOMY      Current Medications: Current Meds  Medication Sig  . amLODipine (  NORVASC) 5 MG tablet Take 1 tablet (5 mg total) by mouth daily.  Marland Kitchen aspirin EC 81 MG tablet Take 1 tablet (81 mg total) by mouth daily. Swallow whole.  . BD INSULIN SYRINGE U/F 31G X 5/16" 0.5 ML MISC as directed.   . blood glucose meter kit and supplies KIT Dispense based on patient and insurance preference. Use up to four times daily as directed. (FOR ICD-9 250.00, 250.01).  . Chlorphen-Phenyleph-APAP (CORICIDIN D COLD/FLU/SINUS) 2-5-325 MG TABS Take 1 tablet by mouth every 6 (six) hours as needed (for congestion or cold-like symptoms).   .  clobetasol ointment (TEMOVATE) 0.01 % Apply 1 application topically 2 (two) times daily as needed (as directed- avoid open wounds).   . furosemide (LASIX) 20 MG tablet Take 1 tablet (20 mg total) by mouth daily.  Marland Kitchen gabapentin (NEURONTIN) 300 MG capsule Take 300 mg by mouth See admin instructions. Take 300 mg by mouth two times a day and an additional 300 mg once daily as needed for nerve pain  . Lancets (ONETOUCH DELICA PLUS [id number]) MISC USE TO TEST YOUR BLOOD SUGAR 2 TIMES A DAY  . latanoprost (XALATAN) 0.005 % ophthalmic solution 1 drop at bedtime.  Marland Kitchen lisinopril (ZESTRIL) 40 MG tablet Take 1 tablet (40 mg total) by mouth daily.  Marland Kitchen loratadine (CLARITIN) 10 MG tablet Take 10 mg by mouth daily.  . metoprolol succinate (TOPROL-XL) 100 MG 24 hr tablet Take 1 tablet (100 mg total) by mouth daily before breakfast. Take with or immediately following a meal.  . NOVOLIN N 100 UNIT/ML injection Inject 35 Units into the skin 2 (two) times daily after a meal. 35 units in the morning and 25 units at night time.  Marland Kitchen olopatadine (PATANOL) 0.1 % ophthalmic solution Place 1 drop into both eyes 2 (two) times daily.  Glory Rosebush ULTRA test strip 2 (two) times daily.  Marland Kitchen oxybutynin (DITROPAN) 5 MG tablet Take 1 tablet (5 mg total) by mouth daily.  . pantoprazole (PROTONIX) 40 MG tablet Take 1 tablet (40 mg total) by mouth daily.  . pravastatin (PRAVACHOL) 40 MG tablet Take 1 tablet (40 mg total) by mouth at bedtime.  Marland Kitchen spironolactone (ALDACTONE) 25 MG tablet Take 1 tablet (25 mg total) by mouth daily.  . traMADol (ULTRAM) 50 MG tablet Take 2 tablets (100 mg total) by mouth every 8 (eight) hours as needed for severe pain.     Allergies:   Contrast media [iodinated diagnostic agents]   Social History   Socioeconomic History  . Marital status: Divorced    Spouse name: Not on file  . Number of children: Not on file  . Years of education: Not on file  . Highest education level: Not on file  Occupational History   . Not on file  Tobacco Use  . Smoking status: Never Smoker  . Smokeless tobacco: Never Used  Vaping Use  . Vaping Use: Never used  Substance and Sexual Activity  . Alcohol use: No  . Drug use: No  . Sexual activity: Never  Other Topics Concern  . Not on file  Social History Narrative  . Not on file   Social Determinants of Health   Financial Resource Strain: Not on file  Food Insecurity: Not on file  Transportation Needs: Not on file  Physical Activity: Not on file  Stress: Not on file  Social Connections: Not on file     Family History: The patient's family history includes Asthma in her mother.  ROS:  Please see the history of present illness.    Review of Systems  Constitutional: Positive for malaise/fatigue. Negative for chills and fever.  HENT: Negative for hearing loss and sore throat.   Eyes: Negative for blurred vision and redness.  Respiratory: Positive for shortness of breath.   Cardiovascular: Negative for chest pain, palpitations, orthopnea, claudication, leg swelling and PND.  Gastrointestinal: Negative for melena, nausea and vomiting.  Genitourinary: Negative for dysuria and flank pain.  Musculoskeletal: Positive for falls and myalgias.  Neurological: Negative for dizziness and loss of consciousness.  Endo/Heme/Allergies: Positive for environmental allergies.  Psychiatric/Behavioral: Negative for substance abuse.    EKGs/Labs/Other Studies Reviewed:    The following studies were reviewed today: Nuclear stress 12/26/2015:  Nuclear stress EF: 60%.  There was no ST segment deviation noted during stress.  Defect 1: There is a small defect of moderate severity present in the basal inferolateral and mid inferolateral location.  Findings consistent with prior myocardial infarction.  This is a low risk study.  The left ventricular ejection fraction is normal (55-65%).   TTE 06/26/19: 1. Left ventricular ejection fraction, by visual estimation,  is 55 to  60%. The left ventricle has normal function. Moderately increased left  ventricular size. There is no left ventricular hypertrophy.  2. Definity contrast agent was given IV to delineate the left ventricular  endocardial borders.  3. Elevated mean left atrial pressure.  4. Left ventricular diastolic Doppler parameters are consistent with  impaired relaxation pattern of LV diastolic filling.  5. Global right ventricle has normal systolic function.The right  ventricular size is normal. No increase in right ventricular wall  thickness.  6. The mitral valve is normal in structure. Mild mitral valve  regurgitation.   TTE 06/06/20: 1. Left ventricular ejection fraction, by estimation, is 50%. The left  ventricle has mildly decreased function. The left ventricle demonstrates  global hypokinesis. There is mild left ventricular hypertrophy. Left  ventricular diastolic parameters are  consistent with Grade I diastolic dysfunction (impaired relaxation).  2. Right ventricular systolic function is normal. The right ventricular  size is normal. Tricuspid regurgitation signal is inadequate for assessing  PA pressure.  3. Left atrial size was mildly dilated.  4. The mitral valve is normal in structure. No evidence of mitral valve  regurgitation. No evidence of mitral stenosis.  5. The aortic valve is tricuspid. Aortic valve regurgitation is not  visualized. Mild aortic valve stenosis. Aortic valve mean gradient  measures 9.0 mmHg (gradient not significantly elevated but visually at  least mild AS).  6. The inferior vena cava is dilated in size with >50% respiratory  variability, suggesting right atrial pressure of 8 mmHg.   Recent Labs: 06/30/2020: BUN 12; Creatinine, Ser 0.77; Potassium 4.1; Sodium 140  Recent Lipid Panel No results found for: CHOL, TRIG, HDL, CHOLHDL, VLDL, LDLCALC, LDLDIRECT   Physical Exam:    VS:  BP 138/76   Pulse 74   Ht '5\' 3"'  (1.6 m)   Wt (!)  320 lb (145.2 kg)   SpO2 99%   BMI 56.69 kg/m     Wt Readings from Last 3 Encounters:  12/19/20 (!) 320 lb (145.2 kg)  09/19/20 (!) 313 lb (142 kg)  09/05/20 (!) 310 lb (140.6 kg)     GEN:  Well nourished, well developed in no acute distress HEENT: Normal NECK: No JVD; No carotid bruits CARDIAC: RRR, no murmurs, rubs, gallops RESPIRATORY:  Clear to auscultation without rales, wheezing or rhonchi  ABDOMEN: Obese, soft,  ND MUSCULOSKELETAL:  Trace edema, warm SKIN: Warm and dry NEUROLOGIC:  Alert and oriented x 3 PSYCHIATRIC:  Normal affect   ASSESSMENT:    1. HFimpEF (Heart Failure with improved EF)   2. Essential (primary) hypertension   3. Hypokalemia   4. Hyperkalemia   5. Pure hypercholesterolemia   6. OSA on CPAP   7. Chronic systolic heart failure ([place])   8. Type 2 diabetes mellitus with complication, with long-term current use of insulin ([place])   9. Morbid obesity ([place])   10. Chest pain of uncertain etiology    PLAN:    In order of problems listed above:  # Chest Pain Improved significantly since starting CPAP. Usually occurs while laying down and improves when sitting up and walking around. Does not sound cardiac in nature. Had similar symptoms in the past and work-up was reassuring.  -Continue CPAP -Nuc 2017 was reassuring -Cath 2006 with nonobstructive CAD -Symptoms non-exertional and improve with movement -Will continue to monitor and manage HF as below -Encourage weight loss as this may be contributing to symptoms  # Chronic systolic heart failure with recovered EF  EF originally 30-35% in the setting of a respiratory/cardiac arrest that improved to 50% on most recent TTE. Stable today with only mild LE edema in the setting of missing her lasix dose yesterday. -Continue metoprolol 154m XL -Continue lisinopril 46mdaily -Continue spironolactone 2578maily -Continue lasix 29m66mily -Continue ASA to 81mg57mly -Did not tolerate jardiance due to yeast  infections and GLP-1 too expensive  # Diabetes Mellitus on Insulin Poorly controlled A1C 9.1-->8.8 -Managed by PCP -Continue insulin   # Morbid Obesity BMI 57. Lost 9lbs and continuing to try to lose weight. -Continue weight loss efforts -Discussed healthy diet and encouraged continued walking with her walker -Declined referral to weight loss clinic; but may consider in the future  # Hypertension Better controlled at home. -Continue amlopdipine 5mg d60my, lisinopril 40mg d60m, spiro 25mg da61mand metoprolol 100mg XL 13mbove  # Hyperlipidemia LDL 115. Did not tolerate higher doses of statins due to muscle aches. -Continue pravastatin 40mg dail56m Obstructive sleep apnea -Resumed CPAP with significant improvement   Medication Adjustments/Labs and Tests Ordered: Current medicines are reviewed at length with the patient today.  Concerns regarding medicines are outlined above.  Orders Placed This Encounter  Procedures  . Basic metabolic panel   No orders of the defined types were placed in this encounter.   Patient Instructions  Medication Instructions:  NO CHANGES *If you need a refill on your cardiac medications before your next appointment, please call your pharmacy*   Lab Work: BMET TODAY If you have labs (blood work) drawn today and your tests are completely normal, you will receive your results only by: . MyChart Marland Kitchenessage (if you have MyChart) OR . [name] in the mail If you have any lab test that is abnormal or we need to change your treatment, we will call you to review the results.   Testing/Procedures: NONE   Follow-Up: [facility]  As part of our continuing mission to provide you with exceptional heart care, we have created designated Provider Care Teams.  These Care Teams include your primary Cardiologist (physician) and Advanced Practice Providers (APPs -  Physician Assistants and Nurse  Practitioners) who all work together to provide you with the care you need, when you need it.  We recommend signing up for the patient  portal called "MyChart".  Sign up information is provided on this After Visit Summary.  MyChart is used to connect with patients for Virtual Visits (Telemedicine).  Patients are able to view lab/test results, encounter notes, upcoming appointments, etc.  Non-urgent messages can be sent to your provider as well.   To learn more about what you can do with MyChart, go to [email or web address].    Your next appointment:   3 month(s)  The format for your next appointment:   In Person  Provider:   Gwyndolyn Kaufman, MD   Other Instructions NONE     Signed, [name], MD  12/19/2020 12:13 PM    [place]

## 2020-12-19 ENCOUNTER — Encounter: Payer: Self-pay | Admitting: Cardiology

## 2020-12-19 ENCOUNTER — Ambulatory Visit (INDEPENDENT_AMBULATORY_CARE_PROVIDER_SITE_OTHER): Payer: HMO | Admitting: Cardiology

## 2020-12-19 ENCOUNTER — Other Ambulatory Visit: Payer: Self-pay

## 2020-12-19 VITALS — BP 138/76 | HR 74 | Ht 63.0 in | Wt 320.0 lb

## 2020-12-19 DIAGNOSIS — Z9989 Dependence on other enabling machines and devices: Secondary | ICD-10-CM

## 2020-12-19 DIAGNOSIS — R079 Chest pain, unspecified: Secondary | ICD-10-CM

## 2020-12-19 DIAGNOSIS — E876 Hypokalemia: Secondary | ICD-10-CM

## 2020-12-19 DIAGNOSIS — E7800 Pure hypercholesterolemia, unspecified: Secondary | ICD-10-CM | POA: Diagnosis not present

## 2020-12-19 DIAGNOSIS — E875 Hyperkalemia: Secondary | ICD-10-CM

## 2020-12-19 DIAGNOSIS — E118 Type 2 diabetes mellitus with unspecified complications: Secondary | ICD-10-CM | POA: Diagnosis not present

## 2020-12-19 DIAGNOSIS — I5020 Unspecified systolic (congestive) heart failure: Secondary | ICD-10-CM

## 2020-12-19 DIAGNOSIS — Z794 Long term (current) use of insulin: Secondary | ICD-10-CM

## 2020-12-19 DIAGNOSIS — I5022 Chronic systolic (congestive) heart failure: Secondary | ICD-10-CM | POA: Diagnosis not present

## 2020-12-19 DIAGNOSIS — I10 Essential (primary) hypertension: Secondary | ICD-10-CM

## 2020-12-19 DIAGNOSIS — G4733 Obstructive sleep apnea (adult) (pediatric): Secondary | ICD-10-CM

## 2020-12-19 LAB — BASIC METABOLIC PANEL
BUN/Creatinine Ratio: 31 — ABNORMAL HIGH (ref 12–28)
BUN: 23 mg/dL (ref 8–27)
CO2: 23 mmol/L (ref 20–29)
Calcium: 9.5 mg/dL (ref 8.7–10.3)
Chloride: 101 mmol/L (ref 96–106)
Creatinine, Ser: 0.75 mg/dL (ref 0.57–1.00)
Glucose: 182 mg/dL — ABNORMAL HIGH (ref 65–99)
Potassium: 4.3 mmol/L (ref 3.5–5.2)
Sodium: 140 mmol/L (ref 134–144)
eGFR: 88 mL/min/{1.73_m2} (ref >59)

## 2020-12-19 NOTE — Patient Instructions (Signed)
Medication Instructions:  NO CHANGES *If you need a refill on your cardiac medications before your next appointment, please call your pharmacy*   Lab Work: BMET TODAY If you have labs (blood work) drawn today and your tests are completely normal, you will receive your results only by: Marland Kitchen MyChart Message (if you have MyChart) OR . [name] in the mail If you have any lab test that is abnormal or we need to change your treatment, we will call you to review the results.   Testing/Procedures: NONE   Follow-Up: At [name], you and your health needs are our priority.  As part of our continuing mission to provide you with exceptional heart care, we have created designated Provider Care Teams.  These Care Teams include your primary Cardiologist (physician) and Advanced Practice Providers (APPs -  Physician Assistants and Nurse Practitioners) who all work together to provide you with the care you need, when you need it.  We recommend signing up for the patient portal called "MyChart".  Sign up information is provided on this After Visit Summary.  MyChart is used to connect with patients for Virtual Visits (Telemedicine).  Patients are able to view lab/test results, encounter notes, upcoming appointments, etc.  Non-urgent messages can be sent to your provider as well.   To learn more about what you can do with MyChart, go to [email or web address].    Your next appointment:   3 month(s)  The format for your next appointment:   In Person  Provider:   Gwyndolyn Kaufman, MD   Other Instructions NONE

## 2021-01-03 ENCOUNTER — Ambulatory Visit: Payer: HMO | Admitting: Podiatry

## 2021-01-03 ENCOUNTER — Other Ambulatory Visit: Payer: Self-pay

## 2021-01-03 ENCOUNTER — Encounter: Payer: Self-pay | Admitting: Podiatry

## 2021-01-03 DIAGNOSIS — Q828 Other specified congenital malformations of skin: Secondary | ICD-10-CM

## 2021-01-03 DIAGNOSIS — M79674 Pain in right toe(s): Secondary | ICD-10-CM

## 2021-01-03 DIAGNOSIS — M79675 Pain in left toe(s): Secondary | ICD-10-CM

## 2021-01-03 DIAGNOSIS — B351 Tinea unguium: Secondary | ICD-10-CM | POA: Diagnosis not present

## 2021-01-03 DIAGNOSIS — L84 Corns and callosities: Secondary | ICD-10-CM

## 2021-01-03 DIAGNOSIS — E1165 Type 2 diabetes mellitus with hyperglycemia: Secondary | ICD-10-CM

## 2021-01-03 NOTE — Progress Notes (Signed)
This patient returns to my office for at risk foot care.  This patient requires this care by a professional since this patient will be at risk due to having diabetes.  Patient has painful corn fifth toe right foot and painful callus on the outside of both feet  B/L.  This patient is unable to cut nails herself since the patient cannot reach her nails.These nails are painful walking and wearing shoes.  This patient presents for at risk foot care today.  General Appearance  Alert, conversant and in no acute stress.  Vascular  Defeered due to wearing compression socks.  Patient has not been seen for 9 months.  Neurologic  Senn-Weinstein monofilament wire test diminished  bilaterally. Muscle power within normal limits bilaterally.  Nails Thick disfigured discolored nails with subungual debris  from hallux to fifth toes bilaterally. No evidence of bacterial infection or drainage bilaterally.  Orthopedic  No limitations of motion  feet .  No crepitus or effusions noted.  No bony pathology or digital deformities noted.  Adducto varus fifth digit  Right foot.  Skin  normotropic skin with no porokeratosis noted bilaterally.  No signs of infections or ulcers noted. Porokeratosis sub 5th  B/L.  Corn 5th digit right foot.    Onychomycosis  Pain in right toes  Pain in left toes  Porokeratosis B/L  Corn fifth toe right foot.  Consent was obtained for treatment procedures.   Mechanical debridement of nails 1-5  bilaterally performed with a nail nipper.  Filed with dremel without incident. Debridement of callus  Both feet with # 15 blade and dremel tool.   Return office visit   3 months                   Told patient to return for periodic foot care and evaluation due to potential at risk complications.   Gardiner Barefoot DPM

## 2021-01-11 DIAGNOSIS — H04123 Dry eye syndrome of bilateral lacrimal glands: Secondary | ICD-10-CM | POA: Diagnosis not present

## 2021-01-11 DIAGNOSIS — H401131 Primary open-angle glaucoma, bilateral, mild stage: Secondary | ICD-10-CM | POA: Diagnosis not present

## 2021-01-11 DIAGNOSIS — H10413 Chronic giant papillary conjunctivitis, bilateral: Secondary | ICD-10-CM | POA: Diagnosis not present

## 2021-02-07 ENCOUNTER — Encounter: Payer: Self-pay | Admitting: Cardiology

## 2021-02-07 ENCOUNTER — Other Ambulatory Visit: Payer: Self-pay

## 2021-02-07 ENCOUNTER — Ambulatory Visit (INDEPENDENT_AMBULATORY_CARE_PROVIDER_SITE_OTHER): Payer: HMO | Admitting: Cardiology

## 2021-02-07 VITALS — BP 139/82 | HR 92 | Ht 63.0 in | Wt 330.2 lb

## 2021-02-07 DIAGNOSIS — I10 Essential (primary) hypertension: Secondary | ICD-10-CM | POA: Diagnosis not present

## 2021-02-07 DIAGNOSIS — G4733 Obstructive sleep apnea (adult) (pediatric): Secondary | ICD-10-CM | POA: Diagnosis not present

## 2021-02-07 DIAGNOSIS — Z9989 Dependence on other enabling machines and devices: Secondary | ICD-10-CM | POA: Diagnosis not present

## 2021-02-07 NOTE — Patient Instructions (Signed)
Medication Instructions:  Your physician recommends that you continue on your current medications as directed. Please refer to the Current Medication list given to you today.  *If you need a refill on your cardiac medications before your next appointment, please call your pharmacy  Follow-Up: At [facility], you and your health needs are our priority.  As part of our continuing mission to provide you with exceptional heart care, we have created designated Provider Care Teams.  These Care Teams include your primary Cardiologist (physician) and Advanced Practice Providers (APPs -  Physician Assistants and Nurse Practitioners) who all work together to provide you with the care you need, when you need it.    Your next appointment:   1 year(s)  The format for your next appointment:   In Person  Provider:   Traci [name], MD  Other Instructions: You have been referred to the [facility]   

## 2021-02-07 NOTE — Progress Notes (Signed)
Cardiology Office Note:    Date:  08/02/2020   ID:  Whitney [name], DOB 1955/12/06, MRN [id number] The patient was identified using 2 identifiers. Date:  02/07/2021   ID:  Whitney [name], DOB 07/29/1956, MRN [id number]  PCP:  [name], MD  Cardiologist:  [name], MD    Referring MD: [name], MD   Chief Complaint  Patient presents with  . Follow-up    OSA, HTN and morbid obesity    History of Present Illness:    Whitney [name] is a 65 y.o. female with a hx of OSA on CPAP, depression, DM, GERD, HTN and OSA. She underwent sleep study in 2013 showing mild OSA with an AHI of 9.7/hr and underwent CPAP titration to 7cm H2O.  Unfortunately her PAP broke in 2018 and her DME had moved out of town and she never called to get another device.  When I last saw her we ordered a sleep study so we could get her another device.  She underwent split night sleep study showing severe OSA with an AHI of 44.8/hr and O2 sats as low as 82%.  She underwent CPAP titration to 15cm H2O.    She is doing well with her CPAP device and thinks that she has gotten used to it.  She tolerates the nasal pillow mask and feels the pressure is adequate.  Since going on CPAP she feels rested in the am and has no significant daytime sleepiness.  She sleeps well at night and only gets up 1 time to use the bathroom.  She denies any significant mouth or nasal dryness or nasal congestion.  She does get some mucous in her mouth at night.  She does not think that he snores.    Past Medical History:  Diagnosis Date  . Arthritis    left knee,right hip  . Cardiac arrest ([place])   . Depression   . Diabetes mellitus   . GERD (gastroesophageal reflux disease)   . Glaucoma   . H/O cardiac arrest 11/2011   PEA  . Heart murmur   . Hyperlipidemia   . Hypertension   . Myocardial infarction ([place]) 2013  . Sleep apnea    Bring machine,mask and tubing    Past Surgical History:  Procedure Laterality Date  . BACK  SURGERY    . COLONOSCOPY N/A 09/12/2016   Procedure: COLONOSCOPY;  Surgeon: Clarene Essex, MD;  Location: [facility];  Service: Endoscopy;  Laterality: N/A;  . CYSTOSCOPY W/ URETERAL STENT PLACEMENT Left 01/04/2013   Procedure: CYSTOSCOPY WITH RETROGRADE PYELOGRAM/URETERAL STENT PLACEMENT;  Surgeon: Bernestine Amass, MD;  Location: [facility];  Service: Urology;  Laterality: Left;  . CYSTOSCOPY WITH RETROGRADE PYELOGRAM, URETEROSCOPY AND STENT PLACEMENT Left 01/26/2013   Procedure: CYSTOSCOPY, JJ STENT REMOVAL, LEFT URETEROSCOPY, ;  Surgeon: Bernestine Amass, MD;  Location: [facility];  Service: Urology;  Laterality: Left;  CYSTO, JJ STENT REMOVAL, LEFT URETEROSCOPY   . FOOT SURGERY    . HERNIA REPAIR    . HOLMIUM LASER APPLICATION Left 01/16/4080   Procedure: HOLMIUM LASER LITHOTRIPSY ;  Surgeon: Bernestine Amass, MD;  Location: [facility];  Service: Urology;  Laterality: Left;  . KNEE SURGERY     left  . PARTIAL HYSTERECTOMY      Current Medications: Current Meds  Medication Sig  . amLODipine (NORVASC) 5 MG tablet Take 1 tablet (5 mg total) by mouth daily.  Marland Kitchen aspirin EC 81 MG tablet Take 1 tablet (81 mg  total) by mouth daily. Swallow whole.  . BD INSULIN SYRINGE U/F 31G X 5/16" 0.5 ML MISC as directed.   . blood glucose meter kit and supplies KIT Dispense based on patient and insurance preference. Use up to four times daily as directed. (FOR ICD-9 250.00, 250.01).  . Chlorphen-Phenyleph-APAP (CORICIDIN D COLD/FLU/SINUS) 2-5-325 MG TABS Take 1 tablet by mouth every 6 (six) hours as needed (for congestion or cold-like symptoms).   . clobetasol ointment (TEMOVATE) 1.60 % Apply 1 application topically 2 (two) times daily as needed (as directed- avoid open wounds).   . furosemide (LASIX) 20 MG tablet Take 1 tablet (20 mg total) by mouth daily.  Marland Kitchen gabapentin (NEURONTIN) 300 MG capsule Take 300 mg by mouth See admin instructions. Take 300 mg by mouth two times a day and an additional 300 mg once daily as needed for  nerve pain  . Lancets (ONETOUCH DELICA PLUS FUXNAT55D) MISC USE TO TEST YOUR BLOOD SUGAR 2 TIMES A DAY  . latanoprost (XALATAN) 0.005 % ophthalmic solution 1 drop at bedtime.  Marland Kitchen lisinopril (ZESTRIL) 40 MG tablet Take 1 tablet (40 mg total) by mouth daily.  Marland Kitchen loratadine (CLARITIN) 10 MG tablet Take 10 mg by mouth daily.  . metoprolol succinate (TOPROL-XL) 100 MG 24 hr tablet Take 1 tablet (100 mg total) by mouth daily before breakfast. Take with or immediately following a meal.  . NOVOLIN N 100 UNIT/ML injection Inject 35 Units into the skin 2 (two) times daily after a meal. 35 units in the morning and 25 units at night time.  Marland Kitchen olopatadine (PATANOL) 0.1 % ophthalmic solution Place 1 drop into both eyes 2 (two) times daily.  Glory Rosebush ULTRA test strip 2 (two) times daily.  Marland Kitchen oxybutynin (DITROPAN) 5 MG tablet Take 1 tablet (5 mg total) by mouth daily.  . pantoprazole (PROTONIX) 40 MG tablet Take 1 tablet (40 mg total) by mouth daily.  . pravastatin (PRAVACHOL) 40 MG tablet Take 1 tablet (40 mg total) by mouth at bedtime.  . traMADol (ULTRAM) 50 MG tablet Take 2 tablets (100 mg total) by mouth every 8 (eight) hours as needed for severe pain.     Allergies:   Contrast media [iodinated diagnostic agents], Empagliflozin, and Metformin   Social History   Socioeconomic History  . Marital status: Divorced    Spouse name: Not on file  . Number of children: Not on file  . Years of education: Not on file  . Highest education level: Not on file  Occupational History  . Not on file  Tobacco Use  . Smoking status: Never Smoker  . Smokeless tobacco: Never Used  Vaping Use  . Vaping Use: Never used  Substance and Sexual Activity  . Alcohol use: No  . Drug use: No  . Sexual activity: Never  Other Topics Concern  . Not on file  Social History Narrative  . Not on file   Social Determinants of Health   Financial Resource Strain: Not on file  Food Insecurity: Not on file  Transportation  Needs: Not on file  Physical Activity: Not on file  Stress: Not on file  Social Connections: Not on file     Family History: The patient's family history includes Asthma in her mother.  ROS:   Please see the history of present illness.    ROS  All other systems reviewed and negative.   EKGs/Labs/Other Studies Reviewed:    The following studies were reviewed today: Sleep study 2013  EKG:  EKG is not ordered today.    Recent Labs: 12/19/2020: BUN 23; Creatinine, Ser 0.75; Potassium 4.3; Sodium 140   Recent Lipid Panel No results found for: CHOL, TRIG, HDL, CHOLHDL, VLDL, LDLCALC, LDLDIRECT   Physical Exam:    VS:  BP 139/82   Pulse 92   Ht '5\' 3"'  (1.6 m)   Wt (!) 330 lb 3.2 oz (149.8 kg)   SpO2 94%   BMI 58.49 kg/m     Wt Readings from Last 3 Encounters:  02/07/21 (!) 330 lb 3.2 oz (149.8 kg)  12/19/20 (!) 320 lb (145.2 kg)  09/19/20 (!) 313 lb (142 kg)    GEN: Well nourished, well developed in no acute distress. Morbidly obese HEENT: Normal NECK:  No carotid bruits, JVD difficult to assess due to body habitus LYMPHATICS: No lymphadenopathy CARDIAC:RRR, no murmurs, rubs, gallops RESPIRATORY:  Clear to auscultation without rales, wheezing or rhonchi  ABDOMEN: Soft, non-tender, non-distended MUSCULOSKELETAL:  No edema with compression hose; No deformity  SKIN: Warm and dry NEUROLOGIC:  Alert and oriented x 3 PSYCHIATRIC:  Normal affect    ASSESSMENT:    1. OSA on CPAP   2. Essential (primary) hypertension   3. Morbid obesity ([place])    PLAN:     In order of problems listed above:  1.  OSA -The patient is tolerating PAP therapy well without any problems. The PAP download was reviewed today and showed an AHI of 0.1/hr on 16 cm H2O with 90% compliance in using more than 4 hours nightly.  The patient has been using and benefiting from PAP use and will continue to benefit from therapy.   2.  HTN -Her BP is well controlled on exam today -she will continue  Toprol XL 129m daily, amlodipine 54mdaily, Lisinopril 4038maily and spiro 54m43mily  3.  Morbid Obesity -I have encouraged her to get into a routine exercise program and cut back on carbs and portions.  -we talked about a referral to [facility] that I recommended but she says that it costs a lot -she is willing to be referred so I will place the referral  COVID-19 Education: The signs and symptoms of COVID-19 were discussed with the patient and how to seek care for testing (follow up with PCP or arrange E-visit).  The importance of social distancing was discussed today.  Time:   Today, I have spent 20 minutes with the patient with telehealth technology discussing the above problems.  Medication Adjustments/Labs and Tests Ordered: Current medicines are reviewed at length with the patient today.  Concerns regarding medicines are outlined above.  No orders of the defined types were placed in this encounter.  No orders of the defined types were placed in this encounter.   Signed, TracFransico Him  02/07/2021 10:37 AM    [facility]

## 2021-02-07 NOTE — Addendum Note (Signed)
Addended by: Antonieta Iba on: 02/07/2021 10:45 AM   Modules accepted: Orders

## 2021-02-10 ENCOUNTER — Ambulatory Visit: Payer: HMO | Admitting: Cardiology

## 2021-02-10 DIAGNOSIS — G4733 Obstructive sleep apnea (adult) (pediatric): Secondary | ICD-10-CM | POA: Diagnosis not present

## 2021-03-09 ENCOUNTER — Encounter (INDEPENDENT_AMBULATORY_CARE_PROVIDER_SITE_OTHER): Payer: Self-pay

## 2021-03-28 ENCOUNTER — Ambulatory Visit: Payer: HMO | Admitting: Cardiology

## 2021-03-29 DIAGNOSIS — E1169 Type 2 diabetes mellitus with other specified complication: Secondary | ICD-10-CM | POA: Diagnosis not present

## 2021-03-29 DIAGNOSIS — D649 Anemia, unspecified: Secondary | ICD-10-CM | POA: Diagnosis not present

## 2021-03-29 DIAGNOSIS — Z79899 Other long term (current) drug therapy: Secondary | ICD-10-CM | POA: Diagnosis not present

## 2021-03-29 DIAGNOSIS — D6489 Other specified anemias: Secondary | ICD-10-CM | POA: Diagnosis not present

## 2021-03-29 DIAGNOSIS — E7800 Pure hypercholesterolemia, unspecified: Secondary | ICD-10-CM | POA: Diagnosis not present

## 2021-03-29 LAB — BASIC METABOLIC PANEL
BUN: 15 (ref 4–21)
CO2: 29 — AB (ref 13–22)
Creatinine: 0.7 (ref <=1.1)
Glucose: 94
Potassium: 4.5 (ref 3.4–5.3)

## 2021-03-29 LAB — LIPID PANEL
Cholesterol: 184 (ref 0–200)
HDL: 52 (ref 35–70)
LDL Cholesterol: 109
LDl/HDL Ratio: 3.5
Triglycerides: 127 (ref 40–160)

## 2021-03-29 LAB — CBC: RBC: 4.47 (ref 3.87–5.11)

## 2021-03-29 LAB — COMPREHENSIVE METABOLIC PANEL: Calcium: 1 — AB (ref 8.7–10.7)

## 2021-03-29 LAB — CBC AND DIFFERENTIAL
HCT: 36 (ref 36–46)
Hemoglobin: 11.9 — AB (ref 12.0–16.0)
Platelets: 287 (ref 150–399)
WBC: 8.9

## 2021-03-29 LAB — HEPATIC FUNCTION PANEL
AST: 11 — AB (ref 13–35)
Bilirubin, Total: 1.2

## 2021-03-29 LAB — HEMOGLOBIN A1C: Hemoglobin A1C: 9.4

## 2021-04-05 ENCOUNTER — Ambulatory Visit: Payer: HMO | Admitting: Podiatry

## 2021-04-05 DIAGNOSIS — E1169 Type 2 diabetes mellitus with other specified complication: Secondary | ICD-10-CM | POA: Diagnosis not present

## 2021-04-05 DIAGNOSIS — E2839 Other primary ovarian failure: Secondary | ICD-10-CM | POA: Diagnosis not present

## 2021-04-05 DIAGNOSIS — G4733 Obstructive sleep apnea (adult) (pediatric): Secondary | ICD-10-CM | POA: Diagnosis not present

## 2021-04-05 DIAGNOSIS — Z0001 Encounter for general adult medical examination with abnormal findings: Secondary | ICD-10-CM | POA: Diagnosis not present

## 2021-04-05 DIAGNOSIS — I872 Venous insufficiency (chronic) (peripheral): Secondary | ICD-10-CM | POA: Diagnosis not present

## 2021-04-05 DIAGNOSIS — G629 Polyneuropathy, unspecified: Secondary | ICD-10-CM | POA: Diagnosis not present

## 2021-04-05 DIAGNOSIS — Z79899 Other long term (current) drug therapy: Secondary | ICD-10-CM | POA: Diagnosis not present

## 2021-04-05 DIAGNOSIS — E7800 Pure hypercholesterolemia, unspecified: Secondary | ICD-10-CM | POA: Diagnosis not present

## 2021-04-05 DIAGNOSIS — Z794 Long term (current) use of insulin: Secondary | ICD-10-CM | POA: Diagnosis not present

## 2021-04-05 DIAGNOSIS — I10 Essential (primary) hypertension: Secondary | ICD-10-CM | POA: Diagnosis not present

## 2021-04-05 DIAGNOSIS — I5022 Chronic systolic (congestive) heart failure: Secondary | ICD-10-CM | POA: Diagnosis not present

## 2021-04-06 ENCOUNTER — Other Ambulatory Visit: Payer: Self-pay | Admitting: Family Medicine

## 2021-04-06 ENCOUNTER — Ambulatory Visit (INDEPENDENT_AMBULATORY_CARE_PROVIDER_SITE_OTHER): Payer: HMO | Admitting: Family Medicine

## 2021-04-06 ENCOUNTER — Other Ambulatory Visit: Payer: Self-pay

## 2021-04-06 ENCOUNTER — Encounter (INDEPENDENT_AMBULATORY_CARE_PROVIDER_SITE_OTHER): Payer: Self-pay | Admitting: Family Medicine

## 2021-04-06 VITALS — BP 143/68 | HR 69 | Temp 97.9°F | Ht 61.0 in | Wt 313.0 lb

## 2021-04-06 DIAGNOSIS — Z6841 Body Mass Index (BMI) 40.0 and over, adult: Secondary | ICD-10-CM | POA: Diagnosis not present

## 2021-04-06 DIAGNOSIS — R5383 Other fatigue: Secondary | ICD-10-CM

## 2021-04-06 DIAGNOSIS — Z0289 Encounter for other administrative examinations: Secondary | ICD-10-CM

## 2021-04-06 DIAGNOSIS — Z1331 Encounter for screening for depression: Secondary | ICD-10-CM | POA: Diagnosis not present

## 2021-04-06 DIAGNOSIS — G4733 Obstructive sleep apnea (adult) (pediatric): Secondary | ICD-10-CM

## 2021-04-06 DIAGNOSIS — E7800 Pure hypercholesterolemia, unspecified: Secondary | ICD-10-CM

## 2021-04-06 DIAGNOSIS — E1165 Type 2 diabetes mellitus with hyperglycemia: Secondary | ICD-10-CM

## 2021-04-06 DIAGNOSIS — E7849 Other hyperlipidemia: Secondary | ICD-10-CM | POA: Diagnosis not present

## 2021-04-06 DIAGNOSIS — I509 Heart failure, unspecified: Secondary | ICD-10-CM

## 2021-04-06 DIAGNOSIS — R921 Mammographic calcification found on diagnostic imaging of breast: Secondary | ICD-10-CM

## 2021-04-06 DIAGNOSIS — R0602 Shortness of breath: Secondary | ICD-10-CM | POA: Diagnosis not present

## 2021-04-06 DIAGNOSIS — I10 Essential (primary) hypertension: Secondary | ICD-10-CM

## 2021-04-06 DIAGNOSIS — Z794 Long term (current) use of insulin: Secondary | ICD-10-CM

## 2021-04-06 DIAGNOSIS — E2839 Other primary ovarian failure: Secondary | ICD-10-CM

## 2021-04-07 LAB — FOLATE: Folate: 7.5 ng/mL (ref >3.0)

## 2021-04-07 LAB — VITAMIN D 25 HYDROXY (VIT D DEFICIENCY, FRACTURES): Vit D, 25-Hydroxy: 9.6 ng/mL — ABNORMAL LOW (ref 30.0–100.0)

## 2021-04-07 LAB — VITAMIN B12: Vitamin B-12: 464 pg/mL (ref 232–1245)

## 2021-04-11 ENCOUNTER — Ambulatory Visit: Payer: HMO | Admitting: Podiatry

## 2021-04-19 ENCOUNTER — Ambulatory Visit: Payer: HMO | Admitting: Podiatry

## 2021-04-19 ENCOUNTER — Other Ambulatory Visit: Payer: Self-pay

## 2021-04-19 ENCOUNTER — Encounter: Payer: Self-pay | Admitting: Podiatry

## 2021-04-19 DIAGNOSIS — M79675 Pain in left toe(s): Secondary | ICD-10-CM

## 2021-04-19 DIAGNOSIS — B351 Tinea unguium: Secondary | ICD-10-CM | POA: Diagnosis not present

## 2021-04-19 DIAGNOSIS — M79674 Pain in right toe(s): Secondary | ICD-10-CM | POA: Diagnosis not present

## 2021-04-19 DIAGNOSIS — Q828 Other specified congenital malformations of skin: Secondary | ICD-10-CM

## 2021-04-19 DIAGNOSIS — E1165 Type 2 diabetes mellitus with hyperglycemia: Secondary | ICD-10-CM

## 2021-04-19 NOTE — Progress Notes (Signed)
This patient returns to my office for at risk foot care.  This patient requires this care by a professional since this patient will be at risk due to having diabetes.  Patient has painful corn fifth toe right foot and painful callus on the outside of both feet  B/L.  This patient is unable to cut nails herself since the patient cannot reach her nails.These nails are painful walking and wearing shoes.  This patient presents for at risk foot care today.  General Appearance  Alert, conversant and in no acute stress.  Vascular  Defeered due to wearing compression socks.  Patient has not been seen for 9 months.  Neurologic  Senn-Weinstein monofilament wire test diminished  bilaterally. Muscle power within normal limits bilaterally.  Nails Thick disfigured discolored nails with subungual debris  from hallux to fifth toes bilaterally. No evidence of bacterial infection or drainage bilaterally.  Orthopedic  No limitations of motion  feet .  No crepitus or effusions noted.  No bony pathology or digital deformities noted.  Adducto varus fifth digit  Right foot.  Plantar flexed fifth metatarsal  B/L.  Skin  normotropic skin with no porokeratosis noted bilaterally.  No signs of infections or ulcers noted. Porokeratosis sub 5th  B/L.  Corn 5th digit right foot.    Onychomycosis  Pain in right toes  Pain in left toes  Porokeratosis B/L  Corn fifth toe right foot.  Consent was obtained for treatment procedures.   Mechanical debridement of nails 1-5  bilaterally performed with a nail nipper.  Filed with dremel without incident. Debridement of callus  Both feet with # 15 blade and dremel tool. Patient qualifies for diabetic shoes due to DPN and plantar flexed fifth metatarsals  B/L. Patient measured by Lattie Haw today.   Return office visit   3 months                   Told patient to return for periodic foot care and evaluation due to potential at risk complications.   Gardiner Barefoot DPM

## 2021-04-19 NOTE — Progress Notes (Signed)
Dear Dr. Radford Pax,   Thank you for referring Whitney [name] to our clinic. The following note includes my evaluation and treatment recommendations.  Chief Complaint:   Whitney [name] (MR# [id number]) is a 65 y.o. female who presents for evaluation and treatment of obesity and related comorbidities. Current BMI is Body mass index is 59.14 kg/m. Whitney [name] has been struggling with her weight for many years and has been unsuccessful in either losing weight, maintaining weight loss, or reaching her healthy weight goal.  Whitney [name] is currently in the action stage of change and ready to dedicate time achieving and maintaining a healthier weight. Whitney [name] is interested in becoming our patient and working on intensive lifestyle modifications including (but not limited to) diet and exercise for weight loss.  Whitney [name] is retired, has no significant other, lives alone, and has no children.  She would like to lose 200 pounds in less than a year.  Only eats twice daily with a "snack" for lunch.  Snacks on chips and chocolate candy bars.  On June 29th, Dr. Raliegh Ip at [facility] drew labs (A1c and others).  We will request labs from PCP.  Whitney [name] declines labs today because she does not want to get charged for them.  Whitney [name]'s habits were reviewed today and are as follows: her desired weight loss is 190 pounds, she has been heavy most of her life, she started gaining weight when she got married, her heaviest weight ever was her current weight, she craves fried chicken, hamburgers, and sandwiches, she snacks frequently in the evenings, she only eats twice a day, she is frequently drinking liquids with calories, she frequently makes poor food choices, and she struggles with emotional eating.  Depression Screen [name]'s Food and Mood (modified PHQ-9) score was 17.  Depression screen [facility] 2/9 04/06/2021  Decreased Interest 3  Down, Depressed, Hopeless -  PHQ - 2 Score 3  Altered sleeping 1  Tired, decreased energy 3   Change in appetite 2  Feeling bad or failure about yourself  3  Trouble concentrating 3  Moving slowly or fidgety/restless 1  Suicidal thoughts 1  PHQ-9 Score 17  Difficult doing work/chores Somewhat difficult   Assessment/Plan:   Orders Placed This Encounter  Procedures   VITAMIN D 25 Hydroxy (Vit-D Deficiency, Fractures)   Vitamin B12   Folate   EKG 12-Lead   1. Other fatigue Whitney [name] admits to daytime somnolence and reports waking up still tired. Patent has a history of symptoms of daytime fatigue, morning fatigue, and snoring. Whitney [name] generally gets 4 or 5 hours of sleep per night, and states that she has generally restful sleep. Snoring is present. Apneic episodes are present. Epworth Sleepiness Score is 9.  Whitney [name] does feel that her weight is causing her energy to be lower than it should be. Fatigue may be related to obesity, depression or many other causes. Labs will be ordered, and in the meanwhile, Whitney [name] will focus on self care including making healthy food choices, increasing physical activity and focusing on stress reduction.  Will check EKG and labs today.  - EKG 12-Lead - VITAMIN D 25 Hydroxy (Vit-D Deficiency, Fractures) - Vitamin B12 - Folate  2. Shortness of breath on exertion Whitney [name] notes increasing shortness of breath with exercising and seems to be worsening over time with weight gain. She notes getting out of breath sooner with activity than she used to. This has gotten worse recently. Kiahna denies shortness of breath at rest or orthopnea.  Whitney [name] does feel that she gets out of breath more easily that she used to when she exercises. Whitney [name] shortness of breath appears to be obesity related and exercise induced. She has agreed to work on weight loss and gradually increase exercise to treat her exercise induced shortness of breath. Will continue to monitor closely.  Will check IC and labs.  3. Type 2 diabetes mellitus with hyperglycemia, with long-term current use of  insulin (HCC) Diabetes Mellitus: Not at goal. Medication: Novolin N 35 units in the morning and 25 units at night. Issues reviewed: blood sugar goals, complications of diabetes mellitus, hypoglycemia prevention and treatment, exercise, and nutrition.  A1c 9.0 at the end of June, she says.    Plan:  Poorly controlled.  Aaryn does not eat a very nutritious diet currently with many simple carbs.  Medication management per PCP/specialists.  Follow prudent nutritional plan, weight loss. The importance of regular follow up with PCP and all other specialists as scheduled was stressed to patient today.  Lab Results  Component Value Date   HGBA1C 7.5 (H) 06/26/2019   HGBA1C 7.6 (H) 07/12/2017   HGBA1C 7.6 (H) 11/16/2011   Lab Results  Component Value Date   CREATININE 0.75 12/19/2020   4. Other congestive heart failure ([place]) Followed by Cardiology. Disease process and medications were reviewed with an emphasis on salt restriction, regular exercise, and regular weight monitoring. History of AMI.  With chronic chest pains, which are stable currently.  Plan:  Avoid microwave meals due to salt content, obtain low salt deli meats, etc.  We will continue to monitor symptoms as they relate to her weight loss journey.  5. Other hyperlipidemia Lipid-lowering medications: pravastatin 40 mg daily.   Plan: Dietary changes: Increase soluble fiber, decrease simple carbohydrates, decrease saturated fat. Exercise changes: Moderate to vigorous-intensity aerobic activity 150 minutes per week or as tolerated. We will continue to monitor along with PCP/specialists as it pertains to her weight loss journey.  Lab Results  Component Value Date   ALT 11 06/26/2019   AST 11 (L) 06/26/2019   ALKPHOS 91 06/26/2019   BILITOT 1.2 06/26/2019   6. OSA (obstructive sleep apnea) Wears CPAP nightly.  Sees Dr. Fransico Him of Cardiology.  OSA is a cause of systemic hypertension and is associated with an increased incidence of  stroke, heart failure, atrial fibrillation, and coronary heart disease. Severe OSA increases all-cause mortality and cardiovascular mortality.   Goal: Treatment of OSA via CPAP compliance and weight loss. Plasma ghrelin levels (appetite or "hunger hormone") are significantly higher in OSA patients than in BMI-matched controls, but decrease to levels similar to those of obese patients without OSA after CPAP treatment.  Weight loss improves OSA by several mechanisms, including reduction in fatty tissue in the throat (i.e. parapharyngeal fat) and the tongue. Loss of abdominal fat increases mediastinal traction on the upper airway making it less likely to collapse during sleep. Studies have also shown that compliance with CPAP treatment improves leptin (hunger inhibitory hormone) imbalance.  7. Depression screening Whitney [name] was screened for depression as part of her new patient workup.  PHQ-9 is 17.  She denies SI or feeling depressed.  Denies being on mood medications.  Whitney [name] had a positive depression screening. Depression is commonly associated with obesity and often results in emotional eating behaviors. We will monitor this closely and work on CBT to help improve the non-hunger eating patterns. Referral to Psychology may be required if no improvement is seen as she continues in  our clinic.  8. Class 3 severe obesity with serious comorbidity and body mass index (BMI) of 50.0 to 59.9 in adult, unspecified obesity type (HCC)  Whitney [name] is currently in the action stage of change and her goal is to continue with weight loss efforts. I recommend Whitney [name] begin the structured treatment plan as follows:  She has agreed to the Category 2 Plan.  Exercise goals:  As is.    Behavioral modification strategies: decreasing simple carbohydrates, decreasing liquid calories, keeping healthy foods in the home, and decreasing junk food.  She was informed of the importance of frequent follow-up visits to maximize her success  with intensive lifestyle modifications for her multiple health conditions. She was informed we would discuss her lab results at her next visit unless there is a critical issue that needs to be addressed sooner. Whitney [name] agreed to keep her next visit at the agreed upon time to discuss these results.  Objective:   Blood pressure (!) 143/68, pulse 69, temperature 97.9 F (36.6 C), height 5\' 1"  (1.549 m), weight (!) 313 lb (142 kg), SpO2 96 %. Body mass index is 59.14 kg/m.  EKG: Normal sinus rhythm, rate 70 bpm.  Indirect Calorimeter completed today shows a VO2 of 306 and a REE of 2131.    General: Cooperative, alert, well developed, in no acute distress. HEENT: Conjunctivae and lids unremarkable. Cardiovascular: Regular rhythm.  Lungs: Normal work of breathing. Neurologic: No focal deficits.   Lab Results  Component Value Date   CREATININE 0.75 12/19/2020   BUN 23 12/19/2020   NA 140 12/19/2020   K 4.3 12/19/2020   CL 101 12/19/2020   CO2 23 12/19/2020   Lab Results  Component Value Date   ALT 11 06/26/2019   AST 11 (L) 06/26/2019   ALKPHOS 91 06/26/2019   BILITOT 1.2 06/26/2019   Lab Results  Component Value Date   HGBA1C 7.5 (H) 06/26/2019   HGBA1C 7.6 (H) 07/12/2017   HGBA1C 7.6 (H) 11/16/2011   Lab Results  Component Value Date   TSH 3.348 11/16/2011   Lab Results  Component Value Date   VD25OH 9.6 (L) 04/06/2021   Lab Results  Component Value Date   WBC 7.3 06/26/2019   HGB 10.8 (L) 06/26/2019   HCT 35.1 (L) 06/26/2019   MCV 85.0 06/26/2019   PLT 249 06/26/2019   Lab Results  Component Value Date   IRON 68 11/16/2011   TIBC 302 11/16/2011   FERRITIN 253 11/16/2011   Obesity Behavioral Intervention:   Approximately 15 minutes were spent on the discussion below.  ASK: We discussed the diagnosis of obesity with Whitney [name] today and Whitney [name] agreed to give [place] permission to discuss obesity behavioral modification therapy today.  ASSESS: Whitney [name] has the diagnosis  of obesity and her BMI today is 59.1. Alaiah is in the action stage of change.   ADVISE: Normalee was educated on the multiple health risks of obesity as well as the benefit of weight loss to improve her health. She was advised of the need for long term treatment and the importance of lifestyle modifications to improve her current health and to decrease her risk of future health problems.  AGREE: Multiple dietary modification options and treatment options were discussed and Whitney [name] agreed to follow the recommendations documented in the above note.  ARRANGE: Ladeana was educated on the importance of frequent visits to treat obesity as outlined per CMS and USPSTF guidelines and agreed to schedule her next follow up appointment today.  Attestation  Statements:   This is the patient's first visit at [facility]. The patient's NEW PATIENT PACKET was reviewed at length. Included in the packet: current and past health history, medications, allergies, ROS, gynecologic history (women only), surgical history, family history, social history, weight history, weight loss surgery history (for those that have had weight loss surgery), nutritional evaluation, mood and food questionnaire, PHQ9, Epworth questionnaire, sleep habits questionnaire, patient life and health improvement goals questionnaire. These will all be scanned into the patient's chart under media.   During the visit, I independently reviewed the patient's EKG, bioimpedance scale results, and indirect calorimeter results. I used this information to tailor a meal plan for the patient that will help her to lose weight and will improve her obesity-related conditions going forward. I performed a medically necessary appropriate examination and/or evaluation. I discussed the assessment and treatment plan with the patient. The patient was provided an opportunity to ask questions and all were answered. The patient agreed with the plan and demonstrated an  understanding of the instructions. Labs were ordered at this visit and will be reviewed at the next visit unless more critical results need to be addressed immediately. Clinical information was updated and documented in the EMR.   I, Water quality scientist, CMA, am acting as Location manager for [employer], DO.  I have reviewed the above documentation for accuracy and completeness, and I agree with the above. Whitney [name], D.O.  The [place] was signed into law in 2016 which includes the topic of electronic health records.  This provides immediate access to information in MyChart.  This includes consultation notes, operative notes, office notes, lab results and pathology reports.  If you have any questions about what you read please let us know at your next visit so we can discuss your concerns and take corrective action if need be.  We are right here with you.

## 2021-04-20 ENCOUNTER — Ambulatory Visit (INDEPENDENT_AMBULATORY_CARE_PROVIDER_SITE_OTHER): Payer: HMO | Admitting: Family Medicine

## 2021-04-20 ENCOUNTER — Encounter (INDEPENDENT_AMBULATORY_CARE_PROVIDER_SITE_OTHER): Payer: Self-pay | Admitting: Family Medicine

## 2021-04-20 VITALS — BP 126/62 | HR 78 | Temp 98.5°F | Ht 61.0 in | Wt 325.0 lb

## 2021-04-20 DIAGNOSIS — E785 Hyperlipidemia, unspecified: Secondary | ICD-10-CM | POA: Diagnosis not present

## 2021-04-20 DIAGNOSIS — Z6841 Body Mass Index (BMI) 40.0 and over, adult: Secondary | ICD-10-CM | POA: Diagnosis not present

## 2021-04-20 DIAGNOSIS — Z9189 Other specified personal risk factors, not elsewhere classified: Secondary | ICD-10-CM | POA: Diagnosis not present

## 2021-04-20 DIAGNOSIS — E559 Vitamin D deficiency, unspecified: Secondary | ICD-10-CM

## 2021-04-20 DIAGNOSIS — I152 Hypertension secondary to endocrine disorders: Secondary | ICD-10-CM | POA: Diagnosis not present

## 2021-04-20 DIAGNOSIS — E1159 Type 2 diabetes mellitus with other circulatory complications: Secondary | ICD-10-CM | POA: Diagnosis not present

## 2021-04-20 DIAGNOSIS — E1169 Type 2 diabetes mellitus with other specified complication: Secondary | ICD-10-CM

## 2021-04-20 DIAGNOSIS — Z794 Long term (current) use of insulin: Secondary | ICD-10-CM | POA: Diagnosis not present

## 2021-04-20 MED ORDER — VITAMIN D (ERGOCALCIFEROL) 1.25 MG (50000 UNIT) PO CAPS
50000.0000 [IU] | ORAL_CAPSULE | ORAL | 0 refills | Status: DC
Start: 2021-04-20 — End: 2021-05-11

## 2021-04-27 ENCOUNTER — Telehealth: Payer: Self-pay | Admitting: Podiatry

## 2021-04-28 NOTE — Progress Notes (Signed)
Chief Complaint:   OBESITY Whitney [name] is here to discuss her progress with her obesity treatment plan along with follow-up of her obesity related diagnoses. Whitney [name] is on the Category 2 Plan and states she is following her eating plan approximately 10% of the time. Whitney [name] states she has not been exercising.  Today's visit was #: 2 Starting weight: 313 lbs  Starting date: 04/06/2021 Today's weight: 325 lbs Today's date: 04/20/2021 Total lbs lost to date: 0 Total lbs lost since last in-office visit: 0  Interim History:  Whitney [name] is here today for her first follow-up office visit since starting the program with [place].  All blood work/ lab tests that were recently ordered by myself or an outside provider were reviewed with patient today per their request.   Extended time was spent counseling her on all new disease processes that were discovered or preexisting ones that are affected by BMI.  she understands that many of these abnormalities will need to monitored regularly along with the current treatment plan of prudent dietary changes, in which we are making each and every office visit, to improve these health parameters.  Whitney [name] had family in town 1.5 weeks ago. Whitney [name] couldn't follow the plan for 1 week, but she has been trying to eat healthy. Whitney [name] ate butter, skipped lunch, and ate skinned chicken, not weighing proteins.  --> Labs were obtained from outside provider at her last office visit, but were never abstracted or scanned to her chart. Labs will be reviewed at her next office visit.  Subjective:   1. Vitamin D deficiency She is supposed to be on a vitamin D medication, but is currently taking no vitamin D supplement. She denies nausea, vomiting or muscle weakness.  Lab Results  Component Value Date   VD25OH 9.6 (L) 04/06/2021   2. Type 2 diabetes mellitus with other specified complication, with long-term current use of insulin (HCC) Medications reviewed. Whitney [name] is taking Novolin.  Diabetic ROS -.  Not checking BS regularly at home.   Lab Results  Component Value Date   HGBA1C 9.4 03/29/2021   HGBA1C 7.5 (H) 06/26/2019   HGBA1C 7.6 (H) 07/12/2017   Lab Results  Component Value Date   LDLCALC 109 03/29/2021   CREATININE 0.7 03/29/2021   No results found for: INSULIN  3. Hypertension associated with type 2 diabetes mellitus ([place]) Cardiovascular ROS: Whitney [name] is taking lisinopril, Norvasc, Lasix, Toprol XL, and spironolactone.  BP Readings from Last 3 Encounters:  04/20/21 126/62  04/06/21 (!) 143/68  02/07/21 139/82   Lab Results  Component Value Date   CREATININE 0.7 03/29/2021   CREATININE 0.75 12/19/2020   CREATININE 0.77 06/30/2020    4. Hyperlipidemia associated with type 2 diabetes mellitus ([place]) Whitney [name] has hyperlipidemia and is on Pravachol. She has been trying to improve her cholesterol levels with intensive lifestyle modification including a low saturated fat diet, exercise and weight loss. She denies any chest pain, claudication or myalgias.  Lab Results  Component Value Date   ALT 11 06/26/2019   AST 11 (A) 03/29/2021   ALKPHOS 91 06/26/2019   BILITOT 1.2 06/26/2019   Lab Results  Component Value Date   CHOL 184 03/29/2021   HDL 52 03/29/2021   LDLCALC 109 03/29/2021   TRIG 127 03/29/2021    5. At risk for heart disease Whitney [name] is at a higher than average risk for cardiovascular disease due to hyperlipidemia and obesity.    Assessment/Plan:   Orders Placed This Encounter  Procedures   CBC and differential   CBC   Basic metabolic panel   Comprehensive metabolic panel   Lipid panel   Hepatic function panel   Hemoglobin A1c    Meds ordered this encounter  Medications   Vitamin D, Ergocalciferol, (DRISDOL) 1.25 MG (50000 UNIT) CAPS capsule    Sig: Take 1 capsule (50,000 Units total) by mouth every 7 (seven) days.    Dispense:  4 capsule    Refill:  0    30 d supply; ov for RF     1. Vitamin D deficiency Worsening.  Low  Vitamin D level contributes to fatigue and are associated with obesity, breast, and colon cancer. She agrees to start to take prescription Vitamin D '@50'$ ,000 IU every week and will follow-up for routine testing of Vitamin D, at least 2-3 times per year to avoid over-replacement.  - START Vitamin D, Ergocalciferol, (DRISDOL) 1.25 MG (50000 UNIT) CAPS capsule; Take 1 capsule (50,000 Units total) by mouth every 7 (seven) days.  Dispense: 4 capsule; Refill: 0  2. Type 2 diabetes mellitus with other specified complication, with long-term current use of insulin (HCC) Whitney [name] is not at goal. Extensive diagnosis counseling was done and the importance of following PNP to improve her multiple medical issues.Good blood sugar control is important to decrease the likelihood of diabetic complications such as nephropathy, neuropathy, limb loss, blindness, coronary artery disease, and death. Intensive lifestyle modification including diet, exercise and weight loss are the first line of treatment for diabetes.   3. Hypertension associated with type 2 diabetes mellitus ([place]) Whitney [name] is at goal today. Her serum creatinine is elevated, but her GFR greater than than 60. Extensive counseling was done. She is working on healthy weight loss and exercise to improve blood pressure control. We will watch for signs of hypotension as she continues her lifestyle modifications.  4. Hyperlipidemia associated with type 2 diabetes mellitus ([place]) Cardiovascular risk and specific lipid/LDL goals reviewed- NOT at goal.   We discussed several lifestyle modifications today and Whitney [name] will continue to work on diet, exercise and weight loss efforts. Orders and follow up as documented in patient record.   Counseling Intensive lifestyle modifications are the first line treatment for this issue. Dietary changes: Increase soluble fiber. Decrease simple carbohydrates. Exercise changes: Moderate to vigorous-intensity aerobic activity 150 minutes per  week if tolerated. Lipid-lowering medications: see documented in medical record.   5. At risk for heart disease Whitney [name] was given approximately 23 minutes of coronary artery disease prevention counseling today. She is 65 y.o. female and has risk factors for heart disease including hyperlipidemia and obesity. We discussed intensive lifestyle modifications today with an emphasis on specific weight loss instructions and strategies.   Repetitive spaced learning was employed today to elicit superior memory formation and behavioral change.   6. Obesity with current BMI 61.41 Whitney [name] is currently in the action stage of change. As such, her goal is to continue with weight loss efforts. She has agreed to the Category 2 Plan with breakfast options.   Exercise goals:  As is.  Behavioral modification strategies: decreasing simple carbohydrates, no skipping meals, and keeping healthy foods in the home.  Whitney [name] has agreed to follow-up with our clinic in 2-3 weeks. She was informed of the importance of frequent follow-up visits to maximize her success with intensive lifestyle modifications for her multiple health conditions.   Objective:   Blood pressure 126/62, pulse 78, temperature 98.5 F (36.9 C), height '5\' 1"'$  (1.549 m), weight Marland Kitchen)  325 lb (147.4 kg), SpO2 97 %. Body mass index is 61.41 kg/m.  General: Cooperative, alert, well developed, in no acute distress. HEENT: Conjunctivae and lids unremarkable. Cardiovascular: Regular rhythm.  Lungs: Normal work of breathing. Neurologic: No focal deficits.   Lab Results  Component Value Date   CREATININE 0.7 03/29/2021   BUN 15 03/29/2021   NA 140 12/19/2020   K 4.5 03/29/2021   CL 101 12/19/2020   CO2 29 (A) 03/29/2021   Lab Results  Component Value Date   ALT 11 06/26/2019   AST 11 (A) 03/29/2021   ALKPHOS 91 06/26/2019   BILITOT 1.2 06/26/2019   Lab Results  Component Value Date   HGBA1C 9.4 03/29/2021   HGBA1C 7.5 (H) 06/26/2019   HGBA1C  7.6 (H) 07/12/2017   HGBA1C 7.6 (H) 11/16/2011   No results found for: INSULIN Lab Results  Component Value Date   TSH 3.348 11/16/2011   Lab Results  Component Value Date   CHOL 184 03/29/2021   HDL 52 03/29/2021   LDLCALC 109 03/29/2021   TRIG 127 03/29/2021   Lab Results  Component Value Date   VD25OH 9.6 (L) 04/06/2021   Lab Results  Component Value Date   WBC 8.9 03/29/2021   HGB 11.9 (A) 03/29/2021   HCT 36 03/29/2021   MCV 85.0 06/26/2019   PLT 287 03/29/2021   Lab Results  Component Value Date   IRON 68 11/16/2011   TIBC 302 11/16/2011   FERRITIN 253 11/16/2011   Attestation Statements:   Reviewed by clinician on day of visit: allergies, medications, problem list, medical history, surgical history, family history, social history, and previous encounter notes.  IMarcille Blanco, CMA, am acting as transcriptionist for [employer], DO   I have reviewed the above documentation for accuracy and completeness, and I agree with the above. Marjory Sneddon, D.O.  The [place] was signed into law in 2016 which includes the topic of electronic health records.  This provides immediate access to information in MyChart.  This includes consultation notes, operative notes, office notes, lab results and pathology reports.  If you have any questions about what you read please let us know at your next visit so we can discuss your concerns and take corrective action if need be.  We are right here with you.

## 2021-05-11 ENCOUNTER — Ambulatory Visit (INDEPENDENT_AMBULATORY_CARE_PROVIDER_SITE_OTHER): Payer: HMO | Admitting: Adult Health

## 2021-05-11 ENCOUNTER — Encounter (INDEPENDENT_AMBULATORY_CARE_PROVIDER_SITE_OTHER): Payer: Self-pay | Admitting: Adult Health

## 2021-05-11 ENCOUNTER — Other Ambulatory Visit: Payer: Self-pay

## 2021-05-11 VITALS — BP 140/70 | HR 88 | Temp 98.5°F | Ht 61.0 in | Wt 311.0 lb

## 2021-05-11 DIAGNOSIS — I152 Hypertension secondary to endocrine disorders: Secondary | ICD-10-CM

## 2021-05-11 DIAGNOSIS — E559 Vitamin D deficiency, unspecified: Secondary | ICD-10-CM

## 2021-05-11 DIAGNOSIS — E1159 Type 2 diabetes mellitus with other circulatory complications: Secondary | ICD-10-CM

## 2021-05-11 DIAGNOSIS — Z6841 Body Mass Index (BMI) 40.0 and over, adult: Secondary | ICD-10-CM

## 2021-05-11 MED ORDER — VITAMIN D (ERGOCALCIFEROL) 1.25 MG (50000 UNIT) PO CAPS: 50000.0000 [IU] | ORAL_CAPSULE | ORAL | 0 refills | Status: DC

## 2021-05-13 NOTE — Progress Notes (Signed)
Chief Complaint:   OBESITY Whitney [name] is here to discuss her progress with her obesity treatment plan along with follow-up of her obesity related diagnoses. Whitney [name] is on the Category 2 Plan with additional breakfast options and states she is following her eating plan approximately 94% of the time. Whitney [name] states she has not been exercising.   Today's visit was #: 3 Starting weight: 313 lbs Starting date: 04/06/2021 Today's weight: 311 lbs Today's date: 05/11/2021 Total lbs lost to date: 14 lbs Total lbs lost since last in-office visit: 14 lbs  Interim History: Whitney [name] is down 14 lbs! Fabulous!  She has been eating lean [place] cold cuts from the grocery store.  She has been consistently following the Category 2 meal plan.  Subjective:   1. Vitamin D deficiency Whitney [name] is currently taking prescription vitamin D 50,000 IU each week. She denies nausea, vomiting or muscle weakness. Vitamin D level as of 7/722 is 9.6. She reports a reduction in fatigue since starting vitamin D.  Lab Results  Component Value Date   VD25OH 9.6 (L) 04/06/2021    2. Hypertension associated with type 2 diabetes mellitus ([place]) Whitney [name] denies acute cardiac symptoms. She states that regular use of her CPAP has resolved her chest tightness.  Whitney [name]'s systolic blood pressure from today's reading, is slightly elevated.  BP Readings from Last 3 Encounters:  05/11/21 140/70  04/20/21 126/62  04/06/21 (!) 143/68    Assessment/Plan:   1. Vitamin D deficiency Low Vitamin D level contributes to fatigue and are associated with obesity, breast, and colon cancer. She agrees to continue to take prescription Vitamin D '@50'$ ,000 IU every week and will follow-up for routine testing of Vitamin D, at least 2-3 times per year to avoid over-replacement.  2.Hypertension associated with type 2 diabetes mellitus ([place]) Whitney [name] is working on healthy weight loss and exercise to improve blood pressure control. We will watch for signs of  hypotension as she continues her lifestyle modifications. Whitney [name] will continue using CPAP and following Category 2 meal plan.  3. Obesity with current BMI 58.9 Whitney [name] is currently in the action stage of change. As such, her goal is to continue with weight loss efforts. She has agreed to the Category 2 Plan with additional breakfast options.  Exercise goals: As is.  Behavioral modification strategies: increasing lean protein intake, decreasing simple carbohydrates, meal planning and cooking strategies, keeping healthy foods in the home, and planning for success.  Whitney [name] has agreed to follow-up with our clinic in 2 weeks. She was informed of the importance of frequent follow-up visits to maximize her success with intensive lifestyle modifications for her multiple health conditions.   Objective:   Blood pressure 140/70, pulse 88, temperature 98.5 F (36.9 C), height '5\' 1"'$  (1.549 m), weight (!) 311 lb (141.1 kg), SpO2 97 %. Body mass index is 58.76 kg/m.  General: Cooperative, alert, well developed, in no acute distress. HEENT: Conjunctivae and lids unremarkable. Cardiovascular: Regular rhythm.  Lungs: Normal work of breathing. Neurologic: No focal deficits.   Lab Results  Component Value Date   CREATININE 0.7 03/29/2021   BUN 15 03/29/2021   NA 140 12/19/2020   K 4.5 03/29/2021   CL 101 12/19/2020   CO2 29 (A) 03/29/2021   Lab Results  Component Value Date   ALT 11 06/26/2019   AST 11 (A) 03/29/2021   ALKPHOS 91 06/26/2019   BILITOT 1.2 06/26/2019   Lab Results  Component Value Date   HGBA1C 9.4 03/29/2021  HGBA1C 7.5 (H) 06/26/2019   HGBA1C 7.6 (H) 07/12/2017   HGBA1C 7.6 (H) 11/16/2011   No results found for: INSULIN Lab Results  Component Value Date   TSH 3.348 11/16/2011   Lab Results  Component Value Date   CHOL 184 03/29/2021   HDL 52 03/29/2021   LDLCALC 109 03/29/2021   TRIG 127 03/29/2021   Lab Results  Component Value Date   VD25OH 9.6 (L) 04/06/2021    Lab Results  Component Value Date   WBC 8.9 03/29/2021   HGB 11.9 (A) 03/29/2021   HCT 36 03/29/2021   MCV 85.0 06/26/2019   PLT 287 03/29/2021   Lab Results  Component Value Date   IRON 68 11/16/2011   TIBC 302 11/16/2011   FERRITIN 253 11/16/2011    Obesity Behavioral Intervention:   Approximately 15 minutes were spent on the discussion below.  ASK: We discussed the diagnosis of obesity with Whitney [name] today and Whitney [name] agreed to give [place] permission to discuss obesity behavioral modification therapy today.  ASSESS: Whitney [name] has the diagnosis of obesity and her BMI today is 58.9. Whitney [name] is in the action stage of change.   ADVISE: Whitney [name] was educated on the multiple health risks of obesity as well as the benefit of weight loss to improve her health. She was advised of the need for long term treatment and the importance of lifestyle modifications to improve her current health and to decrease her risk of future health problems.  AGREE: Multiple dietary modification options and treatment options were discussed and Whitney [name] agreed to follow the recommendations documented in the above note.  ARRANGE: Whitney [name] was educated on the importance of frequent visits to treat obesity as outlined per CMS and USPSTF guidelines and agreed to schedule her next follow up appointment today.  Attestation Statements:   Reviewed by clinician on day of visit: allergies, medications, problem list, medical history, surgical history, family history, social history, and previous encounter notes.   Whitney [name], CMA, am acting as transcriptionist for Whitney Marble, NP-C  I have reviewed the above documentation for accuracy and completeness, and I agree with the above. -  Whitney [name] d. Whitney Montoya, NP-C

## 2021-05-15 DIAGNOSIS — E559 Vitamin D deficiency, unspecified: Secondary | ICD-10-CM | POA: Insufficient documentation

## 2021-05-16 DIAGNOSIS — G4733 Obstructive sleep apnea (adult) (pediatric): Secondary | ICD-10-CM | POA: Diagnosis not present

## 2021-05-20 DIAGNOSIS — Z03818 Encounter for observation for suspected exposure to other biological agents ruled out: Secondary | ICD-10-CM | POA: Diagnosis not present

## 2021-05-20 DIAGNOSIS — Z20822 Contact with and (suspected) exposure to covid-19: Secondary | ICD-10-CM | POA: Diagnosis not present

## 2021-05-23 ENCOUNTER — Telehealth: Payer: Self-pay | Admitting: Podiatry

## 2021-05-23 NOTE — Telephone Encounter (Signed)
Pt left message yesterday asking about diabetic shoe status..   I returned call and left message that I had to call to get the documents completed but was able to get it done and shoes and inserts are ordered and should be coming in about 3 wks or so.

## 2021-05-25 ENCOUNTER — Encounter (INDEPENDENT_AMBULATORY_CARE_PROVIDER_SITE_OTHER): Payer: Self-pay | Admitting: Family Medicine

## 2021-05-25 ENCOUNTER — Other Ambulatory Visit: Payer: Self-pay

## 2021-05-25 ENCOUNTER — Ambulatory Visit (INDEPENDENT_AMBULATORY_CARE_PROVIDER_SITE_OTHER): Payer: HMO | Admitting: Family Medicine

## 2021-05-25 VITALS — BP 134/66 | HR 72 | Temp 97.9°F | Ht 61.0 in | Wt 306.0 lb

## 2021-05-25 DIAGNOSIS — Z9189 Other specified personal risk factors, not elsewhere classified: Secondary | ICD-10-CM

## 2021-05-25 DIAGNOSIS — E1159 Type 2 diabetes mellitus with other circulatory complications: Secondary | ICD-10-CM

## 2021-05-25 DIAGNOSIS — Z6841 Body Mass Index (BMI) 40.0 and over, adult: Secondary | ICD-10-CM

## 2021-05-25 DIAGNOSIS — I152 Hypertension secondary to endocrine disorders: Secondary | ICD-10-CM

## 2021-05-25 DIAGNOSIS — E559 Vitamin D deficiency, unspecified: Secondary | ICD-10-CM | POA: Diagnosis not present

## 2021-05-25 MED ORDER — VITAMIN D (ERGOCALCIFEROL) 1.25 MG (50000 UNIT) PO CAPS: 50000.0000 [IU] | ORAL_CAPSULE | ORAL | 0 refills | Status: DC

## 2021-05-29 NOTE — Progress Notes (Signed)
Chief Complaint:   OBESITY Whitney [name] is here to discuss her progress with her obesity treatment plan along with follow-up of her obesity related diagnoses. Whitney [name] is on the Category 2 Plan with breakfast options and states she is following her eating plan approximately 70% of the time. Whitney [name] states she is doing 0 minutes 0 times per week.  Today's visit was #: 4 Starting weight: 313 lbs Starting date: 04/06/2021 Today's weight: 306 lbs Today's date: 05/25/2021 Total lbs lost to date: 7 Total lbs lost since last in-office visit: 6  Interim History: Whitney [name] is here for a follow up office visit. We reviewed her meal plan and questions were answered.  Patient's food recall appears to be accurate and consistent with what is on plan when she is following it. When eating on plan, her hunger and cravings are well controlled. Whitney [name]'s snack are pork rings, but she is no measuring and cream in her coffee but she is not measuring. She is not weighing any of her protein, and she only drinks 1.5 bottles of water per day.  Subjective:   1. Vitamin D deficiency Whitney [name] is tolerating medication(s) well without side effects.  Medication compliance is good and patient appears to be taking it as prescribed.  Denies additional concerns regarding this condition.   2. Hypertension associated with type 2 diabetes mellitus (Whitney [name]) Whitney [name]'s blood pressure at her last office visit was 140/70, but has much improved at this office visit. She is tolerating medication(s) well without side effects. Medication compliance is good and patient appears to be taking it as prescribed.  Denies additional concerns regarding this condition.   3. At risk for malnutrition Whitney [name] is at increased risk for malnutrition due to not following her meal plan.  Assessment/Plan:  No orders of the defined types were placed in this encounter.   Medications Discontinued During This Encounter  Medication Reason   Vitamin  D, Ergocalciferol, (DRISDOL) 1.25 MG (50000 UNIT) CAPS capsule Reorder     Meds ordered this encounter  Medications   Vitamin D, Ergocalciferol, (DRISDOL) 1.25 MG (50000 UNIT) CAPS capsule    Sig: Take 1 capsule (50,000 Units total) by mouth every 7 (seven) days.    Dispense:  4 capsule    Refill:  0    30 d supply; ov for RF     1. Vitamin D deficiency Whitney [name] will continue prescription Vit D, and we will refill for 1 month.  - Discussed importance of vitamin D to their health and well-being.  - possible symptoms of low Vitamin D can be low energy, depressed mood, muscle aches, joint aches, osteoporosis etc. - low Vitamin D levels may be linked to an increased risk of cardiovascular events and even increased risk of cancers- such as colon and breast.  - I recommend pt take a 50,000 IU weekly prescription vit D - see script below   - Informed patient this may be a lifelong thing, and she was encouraged to continue to take the medicine until told otherwise.   - we will need to monitor levels regularly (every 3-4 mo on average) to keep levels within normal limits.  - weight loss will likely improve availability of vitamin D, thus encouraged Whitney [name] to continue with meal plan and their weight loss efforts to further improve this condition - pt's questions and concerns regarding this condition addressed.  - Vitamin D, Ergocalciferol, (DRISDOL) 1.25 MG (50000 UNIT) CAPS capsule; Take 1 capsule (50,000  Units total) by mouth every 7 (seven) days.  Dispense: 4 capsule; Refill: 0  2. Hypertension associated with type 2 diabetes mellitus (Whitney [name]) Whitney [name]'s blood pressure is at goal today, and she will continue her medications.  - [place] on pathophysiology of disease and discussed treatment plan, which always includes dietary and lifestyle modification as first line.  - Lifestyle changes such as following our low salt, heart healthy meal plan and engaging in a regular exercise program  discussed  - Avoid buying foods that are: processed, frozen, or prepackaged to avoid excess salt. - Ambulatory blood pressure monitoring encouraged.  Reminded patient that if they ever feel poorly in any way, to check their blood pressure and pulse as well. - We will continue to monitor closely alongside PCP/ specialists.  Pt reminded to also f/up with those individuals as instructed by them.  - We will continue to monitor symptoms as they relate to the her weight loss journey.  3. At risk for malnutrition Whitney [name] was given extensive malnutrition prevention education and counseling today of more than 15 minutes. Counseled her that malnutrition refers to inappropriate nutrients or not the right balance of nutrients for optimal health.  Discussed with Whitney [name] that it is absolutely possible to be malnourished but yet obese.  Risk factors, including but not limited to, inappropriate dietary choices, difficulty with obtaining food due to physical or financial limitations, and various physical and mental health conditions were reviewed with Whitney [name].   4. Obesity with current BMI of 57.9 Whitney [name] is currently in the action stage of change. As such, her goal is to continue with weight loss efforts. She has agreed to the Category 2 Plan with breakfast options.   Whitney [name] is to focus on measuring and weighing her protein and snacks.  Exercise goals: All adults should avoid inactivity. Some physical activity is better than none, and adults who participate in any amount of physical activity gain some health benefits. Okay to start walking some.  Behavioral modification strategies: increasing lean protein intake, no skipping meals, keeping healthy foods in the home, and decreasing junk food.  Whitney [name] has agreed to follow-up with our clinic in 3 weeks. She was informed of the importance of frequent follow-up visits to maximize her success with intensive lifestyle modifications for her multiple health  conditions.   Objective:   Blood pressure 134/66, pulse 72, temperature 97.9 F (36.6 C), height '5\' 1"'$  (1.549 m), weight (!) 306 lb (138.8 kg), SpO2 94 %. Body mass index is 57.82 kg/m.  General: Cooperative, alert, well developed, in no acute distress. HEENT: Conjunctivae and lids unremarkable. Cardiovascular: Regular rhythm.  Lungs: Normal work of breathing. Neurologic: No focal deficits.   Lab Results  Component Value Date   CREATININE 0.7 03/29/2021   BUN 15 03/29/2021   NA 140 12/19/2020   K 4.5 03/29/2021   CL 101 12/19/2020   CO2 29 (A) 03/29/2021   Lab Results  Component Value Date   ALT 11 06/26/2019   AST 11 (A) 03/29/2021   ALKPHOS 91 06/26/2019   BILITOT 1.2 06/26/2019   Lab Results  Component Value Date   HGBA1C 9.4 03/29/2021   HGBA1C 7.5 (H) 06/26/2019   HGBA1C 7.6 (H) 07/12/2017   HGBA1C 7.6 (H) 11/16/2011   No results found for: INSULIN Lab Results  Component Value Date   TSH 3.348 11/16/2011   Lab Results  Component Value Date   CHOL 184 03/29/2021   HDL 52 03/29/2021  [place] 109 03/29/2021   TRIG 127 03/29/2021   Lab Results  Component Value Date   VD25OH 9.6 (L) 04/06/2021   Lab Results  Component Value Date   WBC 8.9 03/29/2021   HGB 11.9 (A) 03/29/2021   HCT 36 03/29/2021   MCV 85.0 06/26/2019   PLT 287 03/29/2021   Lab Results  Component Value Date   IRON 68 11/16/2011   TIBC 302 11/16/2011   FERRITIN 253 11/16/2011   Attestation Statements:   Reviewed by clinician on day of visit: allergies, medications, problem list, medical history, surgical history, family history, social history, and previous encounter notes.   [name], am acting as transcriptionist for [employer], DO.  I have reviewed the above documentation for accuracy and completeness, and I agree with the above. Marjory Sneddon, D.O.  The [place] was signed into law in 2016 which includes the topic of electronic health  records.  This provides immediate access to information in MyChart.  This includes consultation notes, operative notes, office notes, lab results and pathology reports.  If you have any questions about what you read please let us know at your next visit so we can discuss your concerns and take corrective action if need be.  We are right here with you.

## 2021-05-30 ENCOUNTER — Telehealth: Payer: Self-pay | Admitting: Podiatry

## 2021-05-30 NOTE — Telephone Encounter (Signed)
Received [employer] auth # [id number] for diabetic shoes/inserts([id number]) valid 8.25.2022 thru 11.23.2022.Whitney [name]

## 2021-06-07 DIAGNOSIS — E1165 Type 2 diabetes mellitus with hyperglycemia: Secondary | ICD-10-CM | POA: Diagnosis not present

## 2021-06-07 DIAGNOSIS — H04123 Dry eye syndrome of bilateral lacrimal glands: Secondary | ICD-10-CM | POA: Diagnosis not present

## 2021-06-07 DIAGNOSIS — H5203 Hypermetropia, bilateral: Secondary | ICD-10-CM | POA: Diagnosis not present

## 2021-06-07 DIAGNOSIS — H401131 Primary open-angle glaucoma, bilateral, mild stage: Secondary | ICD-10-CM | POA: Diagnosis not present

## 2021-06-07 DIAGNOSIS — H1045 Other chronic allergic conjunctivitis: Secondary | ICD-10-CM | POA: Diagnosis not present

## 2021-06-20 ENCOUNTER — Encounter (INDEPENDENT_AMBULATORY_CARE_PROVIDER_SITE_OTHER): Payer: Self-pay | Admitting: Adult Health

## 2021-06-20 ENCOUNTER — Other Ambulatory Visit: Payer: Self-pay

## 2021-06-20 ENCOUNTER — Ambulatory Visit (INDEPENDENT_AMBULATORY_CARE_PROVIDER_SITE_OTHER): Payer: HMO | Admitting: Family Medicine

## 2021-06-20 ENCOUNTER — Ambulatory Visit (INDEPENDENT_AMBULATORY_CARE_PROVIDER_SITE_OTHER): Payer: HMO | Admitting: Adult Health

## 2021-06-20 VITALS — BP 151/68 | HR 72 | Temp 98.2°F | Ht 61.0 in | Wt 300.0 lb

## 2021-06-20 DIAGNOSIS — E1169 Type 2 diabetes mellitus with other specified complication: Secondary | ICD-10-CM | POA: Diagnosis not present

## 2021-06-20 DIAGNOSIS — E1159 Type 2 diabetes mellitus with other circulatory complications: Secondary | ICD-10-CM | POA: Diagnosis not present

## 2021-06-20 DIAGNOSIS — Z6841 Body Mass Index (BMI) 40.0 and over, adult: Secondary | ICD-10-CM

## 2021-06-20 DIAGNOSIS — I152 Hypertension secondary to endocrine disorders: Secondary | ICD-10-CM | POA: Diagnosis not present

## 2021-06-20 DIAGNOSIS — E559 Vitamin D deficiency, unspecified: Secondary | ICD-10-CM | POA: Diagnosis not present

## 2021-06-20 DIAGNOSIS — Z794 Long term (current) use of insulin: Secondary | ICD-10-CM

## 2021-06-20 DIAGNOSIS — E119 Type 2 diabetes mellitus without complications: Secondary | ICD-10-CM | POA: Insufficient documentation

## 2021-06-20 MED ORDER — VITAMIN D (ERGOCALCIFEROL) 1.25 MG (50000 UNIT) PO CAPS: 50000.0000 [IU] | ORAL_CAPSULE | ORAL | 0 refills | Status: DC

## 2021-06-21 NOTE — Progress Notes (Signed)
Chief Complaint:   OBESITY Whitney [name] is here to discuss her progress with her obesity treatment plan along with follow-up of her obesity related diagnoses. Whitney [name] is on the Category 2 Plan and states she is following her eating plan approximately 90% of the time. Whitney [name] states she is not exercising regularly.  Today's visit was #: 5 Starting weight: 313 lbs Starting date: 04/06/2021 Today's weight: 300 lbs Today's date: 06/20/2021 Total lbs lost to date: 13 lbs Total lbs lost since last in-office visit: 6 lbs  Interim History:  Whitney [name]'s BP is elevated at office visit.   She denies acute cardiac symptoms. She reports pain in pelvis and vagina at present. She has been consistently following Cat 2 and has lost 6 lbs since last OV.  Subjective:   1. Type 2 diabetes mellitus with other specified complication, with long-term current use of insulin (Whitney [name]) On 03/29/2021, A1c 9.4 - above goal. She is only on Novolin N 35 units in the morning, 25 units in the evening - managed by PCP.   She is unable to afford any other antidiabetic medications.  Lab Results  Component Value Date   HGBA1C 9.4 03/29/2021   HGBA1C 7.5 (H) 06/26/2019   HGBA1C 7.6 (H) 07/12/2017   Lab Results  Component Value Date   LDLCALC 109 03/29/2021   CREATININE 0.7 03/29/2021   2. Vitamin D deficiency On 04/06/2021, vitamin D level - 7.7 - well below goal of 50. She is currently taking prescription ergocalciferol 50,000 IU each week. She denies nausea, vomiting or muscle weakness.  Lab Results  Component Value Date   VD25OH 9.6 (L) 04/06/2021   3. Hypertension associated with type 2 diabetes mellitus (HCC) BP elevated at office visit.  She denies acute cardiac symptoms. She reports pain in pelvis and vagina at present.  BP Readings from Last 3 Encounters:  06/20/21 (!) 151/68  05/25/21 134/66  05/11/21 140/70   Assessment/Plan:   1. Type 2 diabetes mellitus with other specified complication, with long-term  current use of insulin (HCC) Continue insulin.  Follow-up with PCP as directed.  2. Vitamin D deficiency Refill ergocalciferol 50,000 IU once weekly, as per below.  - Refill Vitamin D, Ergocalciferol, (DRISDOL) 1.25 MG (50000 UNIT) CAPS capsule; Take 1 capsule (50,000 Units total) by mouth every 7 (seven) days.  Dispense: 4 capsule; Refill: 0  3. Hypertension associated with type 2 diabetes mellitus ([place]) Follow-up with PCP about pelvic/vaginal pain.  4. Obesity with current BMI of 56.8  Whitney [name] is currently in the action stage of change. As such, her goal is to continue with weight loss efforts. She has agreed to the Category 2 Plan.   Exercise goals: Older adults should follow the adult guidelines. When older adults cannot meet the adult guidelines, they should be as physically active as their abilities and conditions will allow.  Older adults should do exercises that maintain or improve balance if they are at risk of falling.   Behavioral modification strategies: increasing lean protein intake, decreasing simple carbohydrates, meal planning and cooking strategies, keeping healthy foods in the home, and planning for success.  Follow-up with PCP with pelvic/vaginal pain.  Whitney [name] to help with sweet cravings.  Lean red meat only 1 time per week.  Whitney [name] has agreed to follow-up with our clinic in 2 weeks. She was informed of the importance of frequent follow-up visits to maximize her success with intensive lifestyle modifications for her multiple health conditions.   Objective:   Blood  pressure (!) 151/68, pulse 72, temperature 98.2 F (36.8 C), height 5\' 1"  (1.549 m), weight 300 lb (136.1 kg), SpO2 96 %. Body mass index is 56.68 kg/m.  General: Cooperative, alert, well developed, in no acute distress. HEENT: Conjunctivae and lids unremarkable. Cardiovascular: Regular rhythm.  Lungs: Normal work of breathing. Neurologic: No focal deficits.   Lab Results  Component Value Date    CREATININE 0.7 03/29/2021   BUN 15 03/29/2021   NA 140 12/19/2020   K 4.5 03/29/2021   CL 101 12/19/2020   CO2 29 (A) 03/29/2021   Lab Results  Component Value Date   ALT 11 06/26/2019   AST 11 (A) 03/29/2021   ALKPHOS 91 06/26/2019   BILITOT 1.2 06/26/2019   Lab Results  Component Value Date   HGBA1C 9.4 03/29/2021   HGBA1C 7.5 (H) 06/26/2019   HGBA1C 7.6 (H) 07/12/2017   HGBA1C 7.6 (H) 11/16/2011   Lab Results  Component Value Date   TSH 3.348 11/16/2011   Lab Results  Component Value Date   CHOL 184 03/29/2021   HDL 52 03/29/2021   LDLCALC 109 03/29/2021   TRIG 127 03/29/2021   Lab Results  Component Value Date   VD25OH 9.6 (L) 04/06/2021   Lab Results  Component Value Date   WBC 8.9 03/29/2021   HGB 11.9 (A) 03/29/2021   HCT 36 03/29/2021   MCV 85.0 06/26/2019   PLT 287 03/29/2021   Lab Results  Component Value Date   IRON 68 11/16/2011   TIBC 302 11/16/2011   FERRITIN 253 11/16/2011   Obesity Behavioral Intervention:   Approximately 15 minutes were spent on the discussion below.  ASK: We discussed the diagnosis of obesity with Whitney [name] today and Whitney [name] agreed to give [place] permission to discuss obesity behavioral modification therapy today.  ASSESS: Whitney [name] has the diagnosis of obesity and her BMI today is 56.8. Whitney [name] is in the action stage of change.   ADVISE: Whitney [name] was educated on the multiple health risks of obesity as well as the benefit of weight loss to improve her health. She was advised of the need for long term treatment and the importance of lifestyle modifications to improve her current health and to decrease her risk of future health problems.  AGREE: Multiple dietary modification options and treatment options were discussed and Whitney [name] agreed to follow the recommendations documented in the above note.  ARRANGE: Whitney [name] was educated on the importance of frequent visits to treat obesity as outlined per CMS and USPSTF guidelines and agreed to  schedule her next follow up appointment today.  Attestation Statements:   Reviewed by clinician on day of visit: allergies, medications, problem list, medical history, surgical history, family history, social history, and previous encounter notes.  I, Water quality scientist, CMA, am acting as Location manager for [name], NP.  I have reviewed the above documentation for accuracy and completeness, and I agree with the above. -  Whitney [name] d. Whitney Devins, NP-C

## 2021-06-23 ENCOUNTER — Other Ambulatory Visit: Payer: HMO

## 2021-06-26 ENCOUNTER — Other Ambulatory Visit: Payer: Self-pay

## 2021-06-26 ENCOUNTER — Ambulatory Visit (INDEPENDENT_AMBULATORY_CARE_PROVIDER_SITE_OTHER): Payer: HMO | Admitting: Podiatry

## 2021-06-26 DIAGNOSIS — Q828 Other specified congenital malformations of skin: Secondary | ICD-10-CM

## 2021-06-26 DIAGNOSIS — M216X1 Other acquired deformities of right foot: Secondary | ICD-10-CM | POA: Diagnosis not present

## 2021-06-26 DIAGNOSIS — E1165 Type 2 diabetes mellitus with hyperglycemia: Secondary | ICD-10-CM

## 2021-06-26 DIAGNOSIS — L84 Corns and callosities: Secondary | ICD-10-CM

## 2021-06-26 DIAGNOSIS — M216X2 Other acquired deformities of left foot: Secondary | ICD-10-CM | POA: Diagnosis not present

## 2021-06-26 NOTE — Progress Notes (Addendum)
The patient presented to the office today to pick up diabetic shoes and 3 pair diabetic custom inserts  1 pair of inserts were put in the shoes and the shoes were fitted to the patient. The patient states they are comfortable and free of defect. She was satisfied with the fit of the shoe. Instructions for break in and wear were dispensed. The patient signed the delivery documentation and break in instruction form  If any concerns or questions arise, she is instructed to call  Gardiner Barefoot DPM

## 2021-06-27 NOTE — Progress Notes (Signed)
Cardiology Office Note    Date:  07/04/2021   ID:  Whitney [name], DOB Oct 07, 1955, MRN [id number]   PCP:  [name], [place]  Cardiologist:  [name], MD   Advanced Practice Provider:  No care team member to display Electrophysiologist:  None   [phone]   Chief Complaint  Patient presents with   Follow-up     History of Present Illness:  Whitney [name] is a 65 y.o. female with history of systolic heart failure with recovered EF from 30 to 35% to 50% on echo 06/16/2020, history of respiratory arrest 2013 resulting in cardiac arrest nonobstructive CAD on cath in 2006 hypertension, OSA on CPAP, DM, GERD, mild AS.  Patient last saw Dr. Johney Frame 11/2019 at which time her chest pain resolved with the use of CPAP.  Heart failure with stable, she did not tolerate Jardiance due to yeast infections and GLP-1 too expensive.  Weight loss recommended.  Patient comes in for f/u. Complains of left chest tightness after she eats and thought it was gas. Also occurs when she is sleeping upright and hasn't eaten. Goes away if she sits real still. Walks with walker and no problem with chest pain when she's moving around. Going to a weight loss center and thinks she's lost 13 lbs. Brother died 41 heart trouble, mother died 67 MI, GM with heart disease. Never smoked.    Past Medical History:  Diagnosis Date   Arthritis    left knee,right hip   Back pain    Cardiac arrest (HCC)    CHF (congestive heart failure) (HCC)    Constipation    Depression    Diabetes mellitus    GERD (gastroesophageal reflux disease)    Glaucoma    Glaucoma    H/O cardiac arrest 11/2011   PEA   Heart murmur    Hyperlipidemia    Hypertension    Joint pain    Myocardial infarction [facility]) 2013   Palpitations    Sleep apnea    Bring machine,mask and tubing   Swelling     Past Surgical History:  Procedure Laterality Date   BACK SURGERY     COLONOSCOPY N/A  09/12/2016   Procedure: COLONOSCOPY;  Surgeon: Clarene Essex, MD;  Location: [facility];  Service: Endoscopy;  Laterality: N/A;   CYSTOSCOPY W/ URETERAL STENT PLACEMENT Left 01/04/2013   Procedure: CYSTOSCOPY WITH RETROGRADE PYELOGRAM/URETERAL STENT PLACEMENT;  Surgeon: Bernestine Amass, MD;  Location: [facility];  Service: Urology;  Laterality: Left;   CYSTOSCOPY WITH RETROGRADE PYELOGRAM, URETEROSCOPY AND STENT PLACEMENT Left 01/26/2013   Procedure: CYSTOSCOPY, JJ STENT REMOVAL, LEFT URETEROSCOPY, ;  Surgeon: Bernestine Amass, MD;  Location: [facility];  Service: Urology;  Laterality: Left;  CYSTO, JJ STENT REMOVAL, LEFT URETEROSCOPY    FOOT SURGERY     HERNIA REPAIR     HOLMIUM LASER APPLICATION Left 10/21/9756   Procedure: HOLMIUM LASER LITHOTRIPSY ;  Surgeon: Bernestine Amass, MD;  Location: [facility];  Service: Urology;  Laterality: Left;   KNEE SURGERY     left   PARTIAL HYSTERECTOMY      Current Medications: Current Meds  Medication Sig   amLODipine (NORVASC) 5 MG tablet Take 1 tablet (5 mg total) by mouth daily.   aspirin EC 81 MG tablet Take 1 tablet (81 mg total) by mouth daily. Swallow whole.   BD INSULIN SYRINGE U/F 31G X 5/16" 0.5 ML MISC as directed.  blood glucose meter kit and supplies KIT Dispense based on patient and insurance preference. Use up to four times daily as directed. (FOR ICD-9 250.00, 250.01).   Chlorphen-Phenyleph-APAP (CORICIDIN D COLD/FLU/SINUS) 2-5-325 MG TABS Take 1 tablet by mouth every 6 (six) hours as needed (for congestion or cold-like symptoms).    clobetasol ointment (TEMOVATE) 6.60 % Apply 1 application topically 2 (two) times daily as needed (as directed- avoid open wounds).    furosemide (LASIX) 20 MG tablet Take 1 tablet (20 mg total) by mouth daily.   gabapentin (NEURONTIN) 300 MG capsule Take 300 mg by mouth See admin instructions. Take 300 mg by mouth two times a day and an additional 300 mg once daily as needed for nerve pain   Lancets (ONETOUCH DELICA PLUS  [id number]) MISC USE TO TEST YOUR BLOOD SUGAR 2 TIMES A DAY   latanoprost (XALATAN) 0.005 % ophthalmic solution 1 drop at bedtime.   lisinopril (ZESTRIL) 40 MG tablet Take 1 tablet (40 mg total) by mouth daily.   loratadine (CLARITIN) 10 MG tablet Take 10 mg by mouth daily.   metoprolol succinate (TOPROL-XL) 100 MG 24 hr tablet Take 1 tablet (100 mg total) by mouth daily before breakfast. Take with or immediately following a meal.   NOVOLIN N 100 UNIT/ML injection Inject 35 Units into the skin 2 (two) times daily after a meal. 35 units in the morning and 25 units at night time.   ONETOUCH ULTRA test strip 2 (two) times daily.   oxybutynin (DITROPAN) 5 MG tablet Take 1 tablet (5 mg total) by mouth daily.   pantoprazole (PROTONIX) 40 MG tablet Take 1 tablet (40 mg total) by mouth daily.   pravastatin (PRAVACHOL) 40 MG tablet Take 1 tablet (40 mg total) by mouth at bedtime.   traMADol (ULTRAM) 50 MG tablet Take 2 tablets (100 mg total) by mouth every 8 (eight) hours as needed for severe pain.   Vitamin D, Ergocalciferol, (DRISDOL) 1.25 MG (50000 UNIT) CAPS capsule Take 1 capsule (50,000 Units total) by mouth every 7 (seven) days.     Allergies:   Contrast media [iodinated diagnostic agents], Empagliflozin, and Metformin   Social History   Socioeconomic History   Marital status: Divorced    Spouse name: Not on file   Number of children: Not on file   Years of education: Not on file   Highest education level: Not on file  Occupational History   Occupation: Retired  Tobacco Use   Smoking status: Never   Smokeless tobacco: Never  Vaping Use   Vaping Use: Never used  Substance and Sexual Activity   Alcohol use: No   Drug use: No   Sexual activity: Never  Other Topics Concern   Not on file  Social History Narrative   Not on file   Social Determinants of Health   Financial Resource Strain: Not on file  Food Insecurity: Not on file  Transportation Needs: Not on file  Physical  Activity: Not on file  Stress: Not on file  Social Connections: Not on file      Family History:  The patient's family history includes Anxiety disorder in her father; Asthma in her mother; Depression in her father; Diabetes in her father and mother; Heart disease in her father and mother; Hyperlipidemia in her father and mother; Hypertension in her father and mother; Kidney disease in her mother; Obesity in her mother.   ROS:   Please see the history of present illness.    ROS  All other systems reviewed and are negative.   PHYSICAL EXAM:   VS:  BP 140/64   Pulse 67   Ht '5\' 1"'  (1.549 m)   Wt 299 lb 9.6 oz (135.9 kg)   SpO2 96%   BMI 56.61 kg/m   Physical Exam  GEN: Well nourished, well developed, in no acute distress  Neck: no JVD, carotid bruits, or masses Cardiac:RRR; 2/6 to 3/6 systolic murmur at the left sternal border Respiratory:  clear to auscultation bilaterally, normal work of breathing GI: soft, nontender, nondistended, + BS Ext: without cyanosis, clubbing, or edema, Good distal pulses bilaterally Neuro:  Alert and Oriented x 3 Psych: euthymic mood, full affect  Wt Readings from Last 3 Encounters:  07/04/21 299 lb 9.6 oz (135.9 kg)  06/20/21 300 lb (136.1 kg)  05/25/21 (!) 306 lb (138.8 kg)      Studies/Labs Reviewed:   EKG:  EKG is  ordered today.  The ekg ordered today demonstrates normal sinus rhythm normal EKG  Recent Labs: 12/19/2020: Sodium 140 03/29/2021: BUN 15; Creatinine 0.7; Hemoglobin 11.9; Platelets 287; Potassium 4.5   Lipid Panel    Component Value Date/Time   CHOL 184 03/29/2021 0000   TRIG 127 03/29/2021 0000   HDL 52 03/29/2021 0000   LDLCALC 109 03/29/2021 0000    Additional studies/ records that were reviewed today include:  Nuclear stress 12/26/2015: Nuclear stress EF: 60%. There was no ST segment deviation noted during stress. Defect 1: There is a small defect of moderate severity present in the basal inferolateral and mid  inferolateral location. Findings consistent with prior myocardial infarction. This is a low risk study. The left ventricular ejection fraction is normal (55-65%).     TTE 06/26/19: 1. Left ventricular ejection fraction, by visual estimation, is 55 to  60%. The left ventricle has normal function. Moderately increased left  ventricular size. There is no left ventricular hypertrophy.   2. Definity contrast agent was given IV to delineate the left ventricular  endocardial borders.   3. Elevated mean left atrial pressure.   4. Left ventricular diastolic Doppler parameters are consistent with  impaired relaxation pattern of LV diastolic filling.   5. Global right ventricle has normal systolic function.The right  ventricular size is normal. No increase in right ventricular wall  thickness.   6. The mitral valve is normal in structure. Mild mitral valve  regurgitation.    TTE 06/06/20: 1. Left ventricular ejection fraction, by estimation, is 50%. The left  ventricle has mildly decreased function. The left ventricle demonstrates  global hypokinesis. There is mild left ventricular hypertrophy. Left  ventricular diastolic parameters are  consistent with Grade I diastolic dysfunction (impaired relaxation).   2. Right ventricular systolic function is normal. The right ventricular  size is normal. Tricuspid regurgitation signal is inadequate for assessing  PA pressure.   3. Left atrial size was mildly dilated.   4. The mitral valve is normal in structure. No evidence of mitral valve  regurgitation. No evidence of mitral stenosis.   5. The aortic valve is tricuspid. Aortic valve regurgitation is not  visualized. Mild aortic valve stenosis. Aortic valve mean gradient  measures 9.0 mmHg (gradient not significantly elevated but visually at  least mild AS).   6. The inferior vena cava is dilated in size with >50% respiratory  variability, suggesting right atrial pressure of 8 mmHg.       Risk  Assessment/Calculations:         ASSESSMENT:  1. HFimpEF (Heart Failure with improved EF)   2. Chest pain of uncertain etiology   3. Chest pain, unspecified type   4. Essential hypertension   5. Hyperlipidemia, unspecified hyperlipidemia type   6. Mild aortic stenosis   7. Obstructive sleep apnea   8. Morbid obesity ([place])      PLAN:  In order of problems listed above:  History of systolic CHF with improved LVEF 55 to 60% in 2021-no heart failure on exam.    Chest tightness at rest but not exertional.  At first she thought it was GI but occurring at times when she has not eaten.  Multiple CV risk factors.  Will order Lexiscan Myoview  Nonobstructive CAD on cath in 2006 now with chest tightness at rest see above  Hypertension blood pressure well controlled  Hyperlipidemia LDL 109 03/2021 on Pravachol goal less than 90  Mild aortic stenosis on echo 06/2020 we will repeat echo  OSA compliant on CPAP managed by Dr. Radford Pax  Morbid obesity going to weight loss center and has lost 13 pounds    Shared Decision Making/Informed Consent   Shared Decision Making/Informed Consent The risks [chest pain, shortness of breath, cardiac arrhythmias, dizziness, blood pressure fluctuations, myocardial infarction, stroke/transient ischemic attack, nausea, vomiting, allergic reaction, radiation exposure, metallic taste sensation and life-threatening complications (estimated to be 1 in 10,000)], benefits (risk stratification, diagnosing coronary artery disease, treatment guidance) and alternatives of a nuclear stress test were discussed in detail with Ms. Tesfaye and she agrees to proceed.    Medication Adjustments/Labs and Tests Ordered: Current medicines are reviewed at length with the patient today.  Concerns regarding medicines are outlined above.  Medication changes, Labs and Tests ordered today are listed in the Patient Instructions below. Patient Instructions  Medication Instructions:   Your physician recommends that you continue on your current medications as directed. Please refer to the Current Medication list given to you today.  *If you need a refill on your cardiac medications before your next appointment, please call your pharmacy*   Lab Work: None If you have labs (blood work) drawn today and your tests are completely normal, you will receive your results only by: [place] (if you have MyChart) OR [name] in the mail If you have any lab test that is abnormal or we need to change your treatment, we will call you to review the results.   Testing/Procedures: Your physician has requested that you have an echocardiogram. Echocardiography is a painless test that uses sound waves to create images of your heart. It provides your doctor with information about the size and shape of your heart and how well your heart's chambers and valves are working. This procedure takes approximately one hour. There are no restrictions for this procedure.  Your physician has requested that you have a lexiscan myoview. For further information please visit [email or web address]. Please follow instruction sheet, as given.    Follow-Up: At [facility], you and your health needs are our priority.  As part of our continuing mission to provide you with exceptional heart care, we have created designated Provider Care Teams.  These Care Teams include your primary Cardiologist (physician) and Advanced Practice Providers (APPs -  Physician Assistants and Nurse Practitioners) who all work together to provide you with the care you need, when you need it.   Your next appointment:   4-6 week(s)  The format for your next appointment:   In Person  Provider:   You may see Nira Conn  Renae Fickle, MD or one of the following Advanced Practice Providers on your designated Care Team:   Ermalinda Barrios, PA-C    Signed, Ermalinda Barrios, PA-C  07/04/2021 12:04 PM    [address], [place], [place]  [address] Phone: [phone]; Fax: [phone]

## 2021-06-28 ENCOUNTER — Other Ambulatory Visit (INDEPENDENT_AMBULATORY_CARE_PROVIDER_SITE_OTHER): Payer: Self-pay | Admitting: Family Medicine

## 2021-06-28 DIAGNOSIS — E559 Vitamin D deficiency, unspecified: Secondary | ICD-10-CM

## 2021-06-29 NOTE — Telephone Encounter (Signed)
error 

## 2021-07-04 ENCOUNTER — Ambulatory Visit: Payer: HMO | Admitting: Physician Assistant

## 2021-07-04 ENCOUNTER — Other Ambulatory Visit: Payer: Self-pay

## 2021-07-04 ENCOUNTER — Encounter: Payer: Self-pay | Admitting: Physician Assistant

## 2021-07-04 VITALS — BP 140/64 | HR 67 | Ht 61.0 in | Wt 299.6 lb

## 2021-07-04 DIAGNOSIS — R079 Chest pain, unspecified: Secondary | ICD-10-CM

## 2021-07-04 DIAGNOSIS — E785 Hyperlipidemia, unspecified: Secondary | ICD-10-CM | POA: Diagnosis not present

## 2021-07-04 DIAGNOSIS — I10 Essential (primary) hypertension: Secondary | ICD-10-CM | POA: Diagnosis not present

## 2021-07-04 DIAGNOSIS — I350 Nonrheumatic aortic (valve) stenosis: Secondary | ICD-10-CM

## 2021-07-04 DIAGNOSIS — I5020 Unspecified systolic (congestive) heart failure: Secondary | ICD-10-CM | POA: Diagnosis not present

## 2021-07-04 DIAGNOSIS — G4733 Obstructive sleep apnea (adult) (pediatric): Secondary | ICD-10-CM

## 2021-07-04 NOTE — Patient Instructions (Signed)
Medication Instructions:  Your physician recommends that you continue on your current medications as directed. Please refer to the Current Medication list given to you today.  *If you need a refill on your cardiac medications before your next appointment, please call your pharmacy*   Lab Work: None If you have labs (blood work) drawn today and your tests are completely normal, you will receive your results only by: [place] (if you have MyChart) OR [name] in the mail If you have any lab test that is abnormal or we need to change your treatment, we will call you to review the results.   Testing/Procedures: Your physician has requested that you have an echocardiogram. Echocardiography is a painless test that uses sound waves to create images of your heart. It provides your doctor with information about the size and shape of your heart and how well your heart's chambers and valves are working. This procedure takes approximately one hour. There are no restrictions for this procedure.  Your physician has requested that you have a lexiscan myoview. For further information please visit [email or web address]. Please follow instruction sheet, as given.    Follow-Up: At [facility], you and your health needs are our priority.  As part of our continuing mission to provide you with exceptional heart care, we have created designated Provider Care Teams.  These Care Teams include your primary Cardiologist (physician) and Advanced Practice Providers (APPs -  Physician Assistants and Nurse Practitioners) who all work together to provide you with the care you need, when you need it.   Your next appointment:   4-6 week(s)  The format for your next appointment:   In Person  Provider:   You may see [name], MD or one of the following Advanced Practice Providers on your designated Care Team:   Ermalinda Barrios, [place]

## 2021-07-25 ENCOUNTER — Telehealth (HOSPITAL_COMMUNITY): Payer: Self-pay

## 2021-07-25 ENCOUNTER — Ambulatory Visit (INDEPENDENT_AMBULATORY_CARE_PROVIDER_SITE_OTHER): Payer: HMO | Admitting: Family Medicine

## 2021-07-25 NOTE — Telephone Encounter (Signed)
Spoke with the patient, detailed instructions given. She stated that she would be here for here test. Asked to call back with any questions. Whitney [name] EMTP 

## 2021-07-26 ENCOUNTER — Ambulatory Visit: Payer: HMO | Admitting: Podiatry

## 2021-07-27 ENCOUNTER — Ambulatory Visit (HOSPITAL_BASED_OUTPATIENT_CLINIC_OR_DEPARTMENT_OTHER): Payer: HMO

## 2021-07-27 ENCOUNTER — Ambulatory Visit (HOSPITAL_COMMUNITY): Payer: HMO | Attending: Cardiology

## 2021-07-27 ENCOUNTER — Other Ambulatory Visit: Payer: Self-pay

## 2021-07-27 DIAGNOSIS — I350 Nonrheumatic aortic (valve) stenosis: Secondary | ICD-10-CM | POA: Diagnosis not present

## 2021-07-27 DIAGNOSIS — R079 Chest pain, unspecified: Secondary | ICD-10-CM | POA: Diagnosis not present

## 2021-07-27 LAB — ECHOCARDIOGRAM COMPLETE
AR max vel: 2.38 cm2
AV Area VTI: 2.42 cm2
AV Area mean vel: 2.39 cm2
AV Mean grad: 12.5 mmHg
AV Peak grad: 22.5 mmHg
Ao pk vel: 2.37 m/s
Area-P 1/2: 3.54 cm2
Height: 61 in
S' Lateral: 4.1 cm
Weight: 4784 oz

## 2021-07-27 MED ORDER — REGADENOSON 0.4 MG/5ML IV SOLN
0.4000 mg | Freq: Once | INTRAVENOUS | Status: AC
  Administered 2021-07-27: 0.4 mg via INTRAVENOUS

## 2021-07-27 MED ORDER — TECHNETIUM TC 99M TETROFOSMIN IV KIT
32.9000 | PACK | Freq: Once | INTRAVENOUS | Status: AC | PRN
  Administered 2021-07-27: 32.9 via INTRAVENOUS
  Filled 2021-07-27: qty 33

## 2021-07-28 ENCOUNTER — Ambulatory Visit (HOSPITAL_COMMUNITY): Payer: HMO | Attending: Internal Medicine

## 2021-07-28 LAB — MYOCARDIAL PERFUSION IMAGING
LV dias vol: 132 mL (ref 46–106)
LV sys vol: 52 mL
Nuc Stress EF: 61 %
Peak HR: 98 {beats}/min
Rest HR: 73 {beats}/min
Rest Nuclear Isotope Dose: 32.3 mCi
SDS: 1
SRS: 0
SSS: 1
ST Depression (mm): 0 mm
Stress Nuclear Isotope Dose: 32.9 mCi
TID: 0.83

## 2021-07-28 MED ORDER — TECHNETIUM TC 99M TETROFOSMIN IV KIT
32.3000 | PACK | Freq: Once | INTRAVENOUS | Status: AC | PRN
  Administered 2021-07-28: 32.3 via INTRAVENOUS
  Filled 2021-07-28: qty 33

## 2021-08-02 ENCOUNTER — Encounter (INDEPENDENT_AMBULATORY_CARE_PROVIDER_SITE_OTHER): Payer: Self-pay | Admitting: Family Medicine

## 2021-08-02 ENCOUNTER — Other Ambulatory Visit: Payer: Self-pay

## 2021-08-02 ENCOUNTER — Ambulatory Visit (INDEPENDENT_AMBULATORY_CARE_PROVIDER_SITE_OTHER): Payer: HMO | Admitting: Family Medicine

## 2021-08-02 VITALS — BP 164/79 | HR 78 | Temp 98.2°F | Ht 61.0 in | Wt 290.0 lb

## 2021-08-02 DIAGNOSIS — E1169 Type 2 diabetes mellitus with other specified complication: Secondary | ICD-10-CM

## 2021-08-02 DIAGNOSIS — Z794 Long term (current) use of insulin: Secondary | ICD-10-CM | POA: Diagnosis not present

## 2021-08-02 DIAGNOSIS — E559 Vitamin D deficiency, unspecified: Secondary | ICD-10-CM | POA: Diagnosis not present

## 2021-08-02 DIAGNOSIS — Z6841 Body Mass Index (BMI) 40.0 and over, adult: Secondary | ICD-10-CM | POA: Diagnosis not present

## 2021-08-02 MED ORDER — VITAMIN D (ERGOCALCIFEROL) 1.25 MG (50000 UNIT) PO CAPS: 50000.0000 [IU] | ORAL_CAPSULE | ORAL | 0 refills | Status: DC

## 2021-08-02 NOTE — Progress Notes (Signed)
Chief Complaint:   OBESITY Whitney [name] is here to discuss her progress with her obesity treatment plan along with follow-up of her obesity related diagnoses. Whitney [name] is on the Category 2 Plan and states she is following her eating plan approximately 85% of the time. Whitney [name] states she is doing 0 minutes 0 times per week.  Today's visit was #: 6 Starting weight: 313 lbs Starting date: 04/06/2021 Today's weight: 290 lbs Today's date: 08/02/2021 Total lbs lost to date: 23 Total lbs lost since last in-office visit: 10  Interim History: Whitney [name]'s last appointment was on 06/20/2021 with Whitney Marble, NP. She denies hunger but has some cravings . She eats [place] bars and loves them. She is not weighing her protein.  Subjective:   1. Type 2 diabetes mellitus with other specified complication, with long-term current use of insulin (HCC) Whitney [name] failed Trulicity in the past due to the cost, as she could not afford it once in the donut hole. She is hesitant to start anything new because of this.  2. Vitamin D deficiency Whitney [name] is currently taking prescription vitamin D 50,000 IU each week. She denies nausea, vomiting or muscle weakness.  Assessment/Plan:  No orders of the defined types were placed in this encounter.   Medications Discontinued During This Encounter  Medication Reason   Vitamin D, Ergocalciferol, (DRISDOL) 1.25 MG (50000 UNIT) CAPS capsule Reorder     Meds ordered this encounter  Medications   Vitamin D, Ergocalciferol, (DRISDOL) 1.25 MG (50000 UNIT) CAPS capsule    Sig: Take 1 capsule (50,000 Units total) by mouth every 7 (seven) days.    Dispense:  4 capsule    Refill:  0    30 d supply; ov for RF     1. Type 2 diabetes mellitus with other specified complication, with long-term current use of insulin (HCC) Whitney [name] will call her insurance in regards to GLP-1 coverage, I gave her names of medications to ask about. Good blood sugar control is important to decrease the likelihood of  diabetic complications such as nephropathy, neuropathy, limb loss, blindness, coronary artery disease, and death. Intensive lifestyle modification including diet, exercise and weight loss are the first line of treatment for diabetes.   2. Vitamin D deficiency Low Vitamin D level contributes to fatigue and are associated with obesity, breast, and colon cancer. We will refill prescription Vitamin D for 1 month. Whitney [name] will follow-up for routine testing of Vitamin D, at least 2-3 times per year to avoid over-replacement.  - Vitamin D, Ergocalciferol, (DRISDOL) 1.25 MG (50000 UNIT) CAPS capsule; Take 1 capsule (50,000 Units total) by mouth every 7 (seven) days.  Dispense: 4 capsule; Refill: 0  3. Obesity with current BMI of 54.9 Whitney [name] is currently in the action stage of change. As such, her goal is to continue with weight loss efforts. She has agreed to the Category 2 Plan.   Whitney [name] is to weight her protein.  Exercise goals: As is.  Behavioral modification strategies: meal planning and cooking strategies and keeping healthy foods in the home.  Whitney [name] has agreed to follow-up with our clinic in 2 weeks. She was informed of the importance of frequent follow-up visits to maximize her success with intensive lifestyle modifications for her multiple health conditions.   Objective:   Blood pressure (!) 164/79, pulse 78, temperature 98.2 F (36.8 C), height 5\' 1"  (1.549 m), weight 290 lb (131.5 kg), SpO2 97 %. Body mass index is 54.8 kg/m.  General: Cooperative, alert, well developed,  in no acute distress. HEENT: Conjunctivae and lids unremarkable. Cardiovascular: Regular rhythm.  Lungs: Normal work of breathing. Neurologic: No focal deficits.   Lab Results  Component Value Date   CREATININE 0.7 03/29/2021   BUN 15 03/29/2021   NA 140 12/19/2020   K 4.5 03/29/2021   CL 101 12/19/2020   CO2 29 (A) 03/29/2021   Lab Results  Component Value Date   ALT 11 06/26/2019   AST 11 (A) 03/29/2021    ALKPHOS 91 06/26/2019   BILITOT 1.2 06/26/2019   Lab Results  Component Value Date   HGBA1C 9.4 03/29/2021   HGBA1C 7.5 (H) 06/26/2019   HGBA1C 7.6 (H) 07/12/2017   HGBA1C 7.6 (H) 11/16/2011   No results found for: INSULIN Lab Results  Component Value Date   TSH 3.348 11/16/2011   Lab Results  Component Value Date   CHOL 184 03/29/2021   HDL 52 03/29/2021   LDLCALC 109 03/29/2021   TRIG 127 03/29/2021   Lab Results  Component Value Date   VD25OH 9.6 (L) 04/06/2021   Lab Results  Component Value Date   WBC 8.9 03/29/2021   HGB 11.9 (A) 03/29/2021   HCT 36 03/29/2021   MCV 85.0 06/26/2019   PLT 287 03/29/2021   Lab Results  Component Value Date   IRON 68 11/16/2011   TIBC 302 11/16/2011   FERRITIN 253 11/16/2011    Obesity Behavioral Intervention:   Approximately 15 minutes were spent on the discussion below.  ASK: We discussed the diagnosis of obesity with Whitney [name] today and Whitney [name] agreed to give [place] permission to discuss obesity behavioral modification therapy today.  ASSESS: Whitney [name] has the diagnosis of obesity and her BMI today is 54.9. [name] is in the action stage of change.   ADVISE: Whitney [name] was educated on the multiple health risks of obesity as well as the benefit of weight loss to improve her health. She was advised of the need for long term treatment and the importance of lifestyle modifications to improve her current health and to decrease her risk of future health problems.  AGREE: Multiple dietary modification options and treatment options were discussed and Whitney [name] agreed to follow the recommendations documented in the above note.  ARRANGE: Whitney [name] was educated on the importance of frequent visits to treat obesity as outlined per CMS and USPSTF guidelines and agreed to schedule her next follow up appointment today.  Attestation Statements:   Reviewed by clinician on day of visit: allergies, medications, problem list, medical history, surgical history,  family history, social history, and previous encounter notes.   Whitney [name], am acting as transcriptionist for [employer], DO.  I have reviewed the above documentation for accuracy and completeness, and I agree with the above. Whitney [name], D.O.  The [place] was signed into law in 2016 which includes the topic of electronic health records.  This provides immediate access to information in MyChart.  This includes consultation notes, operative notes, office notes, lab results and pathology reports.  If you have any questions about what you read please let us know at your next visit so we can discuss your concerns and take corrective action if need be.  We are right here with you.

## 2021-08-09 DIAGNOSIS — G4733 Obstructive sleep apnea (adult) (pediatric): Secondary | ICD-10-CM | POA: Diagnosis not present

## 2021-08-11 ENCOUNTER — Ambulatory Visit: Payer: HMO | Admitting: Podiatry

## 2021-08-15 ENCOUNTER — Other Ambulatory Visit: Payer: Self-pay

## 2021-08-15 ENCOUNTER — Encounter (INDEPENDENT_AMBULATORY_CARE_PROVIDER_SITE_OTHER): Payer: Self-pay | Admitting: Family Medicine

## 2021-08-15 ENCOUNTER — Ambulatory Visit (INDEPENDENT_AMBULATORY_CARE_PROVIDER_SITE_OTHER): Payer: HMO | Admitting: Family Medicine

## 2021-08-15 VITALS — BP 148/76 | HR 65 | Temp 97.7°F | Ht 61.0 in | Wt 284.0 lb

## 2021-08-15 DIAGNOSIS — E1159 Type 2 diabetes mellitus with other circulatory complications: Secondary | ICD-10-CM

## 2021-08-15 DIAGNOSIS — Z794 Long term (current) use of insulin: Secondary | ICD-10-CM | POA: Diagnosis not present

## 2021-08-15 DIAGNOSIS — K5909 Other constipation: Secondary | ICD-10-CM

## 2021-08-15 DIAGNOSIS — I152 Hypertension secondary to endocrine disorders: Secondary | ICD-10-CM

## 2021-08-15 DIAGNOSIS — Z6841 Body Mass Index (BMI) 40.0 and over, adult: Secondary | ICD-10-CM

## 2021-08-15 DIAGNOSIS — E1169 Type 2 diabetes mellitus with other specified complication: Secondary | ICD-10-CM

## 2021-08-15 MED ORDER — MAGNESIUM SALICYLATE 325 MG PO TABS: ORAL_TABLET | ORAL | 0 refills | Status: DC

## 2021-08-15 NOTE — Progress Notes (Deleted)
Cardiology Office Note    Date:  08/15/2021   ID:  Whitney, [name] 06-03-1956, MRN [id number]   PCP:  [name], [place]  Cardiologist:  [name], MD   Advanced Practice Provider:  No care team member to display Electrophysiologist:  None   [phone]   No chief complaint on file.   History of Present Illness:  Whitney [name] is a 65 y.o. female with history of systolic heart failure with recovered EF from 30 to 35% to 50% on echo 06/16/2020, history of respiratory arrest 2013 resulting in cardiac arrest nonobstructive CAD on cath in 2006 hypertension, OSA on CPAP, DM, GERD, mild AS.   Patient last saw Dr. Johney Frame 11/2019 at which time her chest pain resolved with the use of CPAP.  Heart failure with stable, she did not tolerate Jardiance due to yeast infections and GLP-1 too expensive.  Weight loss recommended.  I saw the patient 07/04/2021 complaining of chest tightness at rest but not exertional.  At first she thought it was GI but occurring at times when she has not eaten.  Lexiscan Myoview 07/28/2021 normal LV perfusion no ischemia 2D echo normal LVEF 55 to 60% with mild apical hypokinesis mild LVH mild MR trivial aortic regurgitation      Past Medical History:  Diagnosis Date   Arthritis    left knee,right hip   Back pain    Cardiac arrest (HCC)    CHF (congestive heart failure) (HCC)    Constipation    Depression    Diabetes mellitus    GERD (gastroesophageal reflux disease)    Glaucoma    Glaucoma    H/O cardiac arrest 11/2011   PEA   Heart murmur    Hyperlipidemia    Hypertension    Joint pain    Myocardial infarction [facility]) 2013   Palpitations    Sleep apnea    Bring machine,mask and tubing   Swelling     Past Surgical History:  Procedure Laterality Date   BACK SURGERY     COLONOSCOPY N/A 09/12/2016   Procedure: COLONOSCOPY;  Surgeon: Clarene Essex, MD;  Location: [facility];  Service: Endoscopy;   Laterality: N/A;   CYSTOSCOPY W/ URETERAL STENT PLACEMENT Left 01/04/2013   Procedure: CYSTOSCOPY WITH RETROGRADE PYELOGRAM/URETERAL STENT PLACEMENT;  Surgeon: Bernestine Amass, MD;  Location: [facility];  Service: Urology;  Laterality: Left;   CYSTOSCOPY WITH RETROGRADE PYELOGRAM, URETEROSCOPY AND STENT PLACEMENT Left 01/26/2013   Procedure: CYSTOSCOPY, JJ STENT REMOVAL, LEFT URETEROSCOPY, ;  Surgeon: Bernestine Amass, MD;  Location: [facility];  Service: Urology;  Laterality: Left;  CYSTO, JJ STENT REMOVAL, LEFT URETEROSCOPY    FOOT SURGERY     HERNIA REPAIR     HOLMIUM LASER APPLICATION Left 4/78/2956   Procedure: HOLMIUM LASER LITHOTRIPSY ;  Surgeon: Bernestine Amass, MD;  Location: [facility];  Service: Urology;  Laterality: Left;   KNEE SURGERY     left   PARTIAL HYSTERECTOMY      Current Medications: No outpatient medications have been marked as taking for the 08/29/21 encounter (Appointment) with [name] Burn, PA-C.     Allergies:   Contrast media [iodinated diagnostic agents], Empagliflozin, and Metformin   Social History   Socioeconomic History   Marital status: Divorced    Spouse name: Not on file   Number of children: Not on file   Years of education: Not on file   Highest education  level: Not on file  Occupational History   Occupation: Retired  Tobacco Use   Smoking status: Never   Smokeless tobacco: Never  Vaping Use   Vaping Use: Never used  Substance and Sexual Activity   Alcohol use: No   Drug use: No   Sexual activity: Never  Other Topics Concern   Not on file  Social History Narrative   Not on file   Social Determinants of Health   Financial Resource Strain: Not on file  Food Insecurity: Not on file  Transportation Needs: Not on file  Physical Activity: Not on file  Stress: Not on file  Social Connections: Not on file     Family History:  The patient's ***family history includes Anxiety disorder in her father; Asthma in her mother; Depression in her father;  Diabetes in her father and mother; Heart disease in her father and mother; Hyperlipidemia in her father and mother; Hypertension in her father and mother; Kidney disease in her mother; Obesity in her mother.   ROS:   Please see the history of present illness.    ROS All other systems reviewed and are negative.   PHYSICAL EXAM:   VS:  There were no vitals taken for this visit.  Physical Exam  GEN: Well nourished, well developed, in no acute distress  HEENT: normal  Neck: no JVD, carotid bruits, or masses Cardiac:RRR; no murmurs, rubs, or gallops  Respiratory:  clear to auscultation bilaterally, normal work of breathing GI: soft, nontender, nondistended, + BS Ext: without cyanosis, clubbing, or edema, Good distal pulses bilaterally MS: no deformity or atrophy  Skin: warm and dry, no rash Neuro:  Alert and Oriented x 3, Strength and sensation are intact Psych: euthymic mood, full affect  Wt Readings from Last 3 Encounters:  08/15/21 284 lb (128.8 kg)  08/02/21 290 lb (131.5 kg)  07/27/21 299 lb (135.6 kg)      Studies/Labs Reviewed:   EKG:  EKG is*** ordered today.  The ekg ordered today demonstrates ***  Recent Labs: 12/19/2020: Sodium 140 03/29/2021: BUN 15; Creatinine 0.7; Hemoglobin 11.9; Platelets 287; Potassium 4.5   Lipid Panel    Component Value Date/Time   CHOL 184 03/29/2021 0000   TRIG 127 03/29/2021 0000   HDL 52 03/29/2021 0000   LDLCALC 109 03/29/2021 0000    Additional studies/ records that were reviewed today include:  NST 07/28/21  The study is normal. The study is low risk.   No ST deviation was noted.   LV perfusion is normal.   Left ventricular function is normal. Nuclear stress EF: 61 %. The left ventricular ejection fraction is normal (55-65%).   Prior study available for comparison from 12/27/2014. There are changes compared to prior study which appear to be resolved.   Echo 07/28/21 IMPRESSIONS     1. Mild apical hypokinesis. . Left  ventricular ejection fraction, by  estimation, is 55 to 60%. The left ventricle has normal function. The left  ventricular internal cavity size was mildly dilated. There is mild left  ventricular hypertrophy. Left  ventricular diastolic parameters are indeterminate.   2. Right ventricular systolic function is normal. The right ventricular  size is normal.   3. The mitral valve is normal in structure. Mild mitral valve  regurgitation.   4. AV is trileaflet Appears to open well. Peak and mean gradients through  the LV/AV/aorta are 25 and 14 mm Hg respectively Turbulence appears to  start above valve (see aorta) Gradients not signficantly changed  from  previous echo . The aortic valve is  tricuspid. Aortic valve regurgitation is trivial. Mild aortic valve  sclerosis is present, with no evidence of aortic valve stenosis.   5. There is a thickening/brightness supravalvularly at sinotublar  junction. Question mild ridge. Turbulence seems to start at this point.   6. The inferior vena cava is dilated in size with <50% respiratory  variability, suggesting right atrial pressure of 15 mmHg.   Risk Assessment/Calculations:   {Does this patient have ATRIAL FIBRILLATION?:[phone]}     ASSESSMENT:    No diagnosis found.   PLAN:  In order of problems listed above:   History of systolic CHF with improved LVEF 55 to 60% in 2021-no heart failure on exam.     Chest tightness at rest but not exertional.  At first she thought it was GI but occurring at times when she has not eaten.  Multiple CV risk factors.  Lexiscan Myoview 07/28/2021 normal, echo normal LVEF 55 to 60% with mild apical hypokinesis   Nonobstructive CAD on cath in 2006 now with chest tightness at rest see above   Hypertension blood pressure well controlled   Hyperlipidemia LDL 109 03/2021 on Pravachol goal less than 90   Mild aortic stenosis on echo 06/2020 we will repeat echo   OSA compliant on CPAP managed by Dr. Radford Pax    Morbid obesity going to weight loss center and has lost 13 pounds  Shared Decision Making/Informed Consent   {Are you ordering a CV Procedure (e.g. stress test, cath, DCCV, TEE, etc)?   Press F2        :[id number]}    Medication Adjustments/Labs and Tests Ordered: Current medicines are reviewed at length with the patient today.  Concerns regarding medicines are outlined above.  Medication changes, Labs and Tests ordered today are listed in the Patient Instructions below. There are no Patient Instructions on file for this visit.   Sumner Boast, PA-C  08/15/2021 3:57 PM    [facility] [address] [place], [place], [place]  [address] Phone: [phone]; Fax: [phone]

## 2021-08-16 NOTE — Progress Notes (Signed)
Chief Complaint:   OBESITY Whitney [name] is here to discuss her progress with her obesity treatment plan along with follow-up of her obesity related diagnoses. Whitney [name] is on the Category 2 Plan and states she is following her eating plan approximately 65% of the time. Whitney [name] states she is doing 0 minutes 0 times per week.  Today's visit was #: 7 Starting weight: 313 lbs Starting date: 04/06/2021 Today's weight: 284 lbs Today's date: 08/15/2021 Total lbs lost to date: 29 Total lbs lost since last in-office visit: 6  Interim History: Whitney [name] is here for a follow up office visit. We reviewed her meal plan and questions were answered. Patient's food recall appears to be accurate and consistent with what is on plan when she is following it. When eating on plan, her hunger and cravings are well controlled. Danicia has had several funerals lately and she had some "off the plan foods". However, she is doing a great job most days with portion control and making smarter choices.  Subjective:   1. Type 2 diabetes mellitus with other specified complication, with long-term current use of insulin (HCC) Whitney [name]'s blood sugars range between 230's-250, and one time it was 162. This is much better than her usual. She didn't call her insurance yet regarding GLP-1 coverage.  2. Hypertension associated with type 2 diabetes mellitus ([place]) Whitney [name]'s blood pressure is better controlled than at her last office visit. Cardiovascular ROS: no chest pain or dyspnea on exertion.  BP Readings from Last 3 Encounters:  08/15/21 (!) 148/76  08/02/21 (!) 164/79  07/04/21 140/64   3. Other constipation Whitney [name] takes a little MO-magnesia occasionally to stay regular.  Assessment/Plan:  No orders of the defined types were placed in this encounter.   Medications Discontinued During This Encounter  Medication Reason   SPECIALTY VITAMINS PRODUCTS PO Error   Magnesium Salicylate 562 MG TABS Reorder     Meds ordered this  encounter  Medications   Magnesium Salicylate 130 MG TABS    Sig: Take 1-2 tablets by mouth twice daily as needed via CALM supplement    Dispense:  60 tablet    Refill:  0     1. Type 2 diabetes mellitus with other specified complication, with long-term current use of insulin (HCC) Good blood sugar control is important to decrease the likelihood of diabetic complications such as nephropathy, neuropathy, limb loss, blindness, coronary artery disease, and death. Intensive lifestyle modification including diet, exercise and weight loss are the first line of treatment for diabetes.   2. Hypertension associated with type 2 diabetes mellitus ([place]) Whitney [name] has a blood pressure cuff at home and she is to check her blood pressure q daily or every other day. She is to bring in her log to her next office visit.  3. Other constipation Whitney [name] takes Fe 325 mg q daily or up to BID in the form of CALM supplementation as needed. She will continue miralax and MOM as needed. Orders and follow up as documented in patient record.   Counseling Getting to Good Bowel Health: Your goal is to have one soft bowel movement each day. Drink at least 8 glasses of water each day. Eat plenty of fiber (goal is over 25 grams each day). It is best to get most of your fiber from dietary sources which includes leafy green vegetables, fresh fruit, and whole grains. You may need to add fiber with the help of OTC fiber supplements. These include Metamucil, Citrucel, and Flaxseed.  If you are still having trouble, try adding Miralax or Magnesium Citrate. If all of these changes do not work, Cabin crew.  - Magnesium Salicylate 267 MG TABS; Take 1-2 tablets by mouth twice daily as needed via CALM supplement  Dispense: 60 tablet; Refill: 0  4. Obesity with current BMI of 53.8 Whitney [name] is currently in the action stage of change. As such, her goal is to continue with weight loss efforts. She has agreed to the Category 2 Plan with  breakfast options.   Exercise goals: As is.   Behavioral modification strategies: increasing lean protein intake, decreasing simple carbohydrates, and planning for success.  Whitney [name] has agreed to follow-up with our clinic in 2 weeks. She was informed of the importance of frequent follow-up visits to maximize her success with intensive lifestyle modifications for her multiple health conditions.   Objective:   Blood pressure (!) 148/76, pulse 65, temperature 97.7 F (36.5 C), height 5\' 1"  (1.549 m), weight 284 lb (128.8 kg), SpO2 97 %. Body mass index is 53.66 kg/m.  General: Cooperative, alert, well developed, in no acute distress. HEENT: Conjunctivae and lids unremarkable. Cardiovascular: Regular rhythm.  Lungs: Normal work of breathing. Neurologic: No focal deficits.   Lab Results  Component Value Date   CREATININE 0.7 03/29/2021   BUN 15 03/29/2021   NA 140 12/19/2020   K 4.5 03/29/2021   CL 101 12/19/2020   CO2 29 (A) 03/29/2021   Lab Results  Component Value Date   ALT 11 06/26/2019   AST 11 (A) 03/29/2021   ALKPHOS 91 06/26/2019   BILITOT 1.2 06/26/2019   Lab Results  Component Value Date   HGBA1C 9.4 03/29/2021   HGBA1C 7.5 (H) 06/26/2019   HGBA1C 7.6 (H) 07/12/2017   HGBA1C 7.6 (H) 11/16/2011   No results found for: INSULIN Lab Results  Component Value Date   TSH 3.348 11/16/2011   Lab Results  Component Value Date   CHOL 184 03/29/2021   HDL 52 03/29/2021   LDLCALC 109 03/29/2021   TRIG 127 03/29/2021   Lab Results  Component Value Date   VD25OH 9.6 (L) 04/06/2021   Lab Results  Component Value Date   WBC 8.9 03/29/2021   HGB 11.9 (A) 03/29/2021   HCT 36 03/29/2021   MCV 85.0 06/26/2019   PLT 287 03/29/2021   Lab Results  Component Value Date   IRON 68 11/16/2011   TIBC 302 11/16/2011   FERRITIN 253 11/16/2011    Obesity Behavioral Intervention:   Approximately 15 minutes were spent on the discussion below.  ASK: We discussed the  diagnosis of obesity with Whitney [name] today and Whitney [name] agreed to give [place] permission to discuss obesity behavioral modification therapy today.  ASSESS: Whitney [name] has the diagnosis of obesity and her BMI today is 53.8. Whitney [name] is in the action stage of change.   ADVISE: Whitney [name] was educated on the multiple health risks of obesity as well as the benefit of weight loss to improve her health. She was advised of the need for long term treatment and the importance of lifestyle modifications to improve her current health and to decrease her risk of future health problems.  AGREE: Multiple dietary modification options and treatment options were discussed and Skyann agreed to follow the recommendations documented in the above note.  ARRANGE: Shanikwa was educated on the importance of frequent visits to treat obesity as outlined per CMS and USPSTF guidelines and agreed to schedule her next follow up appointment today.  Attestation  Statements:   Reviewed by clinician on day of visit: allergies, medications, problem list, medical history, surgical history, family history, social history, and previous encounter notes.   Whitney [name], am acting as transcriptionist for [employer], DO.  I have reviewed the above documentation for accuracy and completeness, and I agree with the above. Whitney [name], D.O.  The [place] was signed into law in 2016 which includes the topic of electronic health records.  This provides immediate access to information in MyChart.  This includes consultation notes, operative notes, office notes, lab results and pathology reports.  If you have any questions about what you read please let us know at your next visit so we can discuss your concerns and take corrective action if need be.  We are right here with you.

## 2021-08-29 ENCOUNTER — Ambulatory Visit: Payer: HMO | Admitting: Physician Assistant

## 2021-08-29 DIAGNOSIS — R079 Chest pain, unspecified: Secondary | ICD-10-CM

## 2021-08-29 DIAGNOSIS — E7849 Other hyperlipidemia: Secondary | ICD-10-CM

## 2021-08-29 DIAGNOSIS — I2510 Atherosclerotic heart disease of native coronary artery without angina pectoris: Secondary | ICD-10-CM

## 2021-08-29 DIAGNOSIS — I10 Essential (primary) hypertension: Secondary | ICD-10-CM

## 2021-08-29 DIAGNOSIS — I5020 Unspecified systolic (congestive) heart failure: Secondary | ICD-10-CM

## 2021-08-29 DIAGNOSIS — I350 Nonrheumatic aortic (valve) stenosis: Secondary | ICD-10-CM

## 2021-08-29 DIAGNOSIS — G4733 Obstructive sleep apnea (adult) (pediatric): Secondary | ICD-10-CM

## 2021-09-01 ENCOUNTER — Ambulatory Visit (INDEPENDENT_AMBULATORY_CARE_PROVIDER_SITE_OTHER): Payer: HMO | Admitting: Podiatry

## 2021-09-01 ENCOUNTER — Encounter: Payer: Self-pay | Admitting: Podiatry

## 2021-09-01 ENCOUNTER — Other Ambulatory Visit: Payer: Self-pay

## 2021-09-01 DIAGNOSIS — M216X1 Other acquired deformities of right foot: Secondary | ICD-10-CM

## 2021-09-01 DIAGNOSIS — Q828 Other specified congenital malformations of skin: Secondary | ICD-10-CM

## 2021-09-01 DIAGNOSIS — M216X2 Other acquired deformities of left foot: Secondary | ICD-10-CM

## 2021-09-01 DIAGNOSIS — M79675 Pain in left toe(s): Secondary | ICD-10-CM | POA: Diagnosis not present

## 2021-09-01 DIAGNOSIS — E1165 Type 2 diabetes mellitus with hyperglycemia: Secondary | ICD-10-CM | POA: Diagnosis not present

## 2021-09-01 DIAGNOSIS — L84 Corns and callosities: Secondary | ICD-10-CM

## 2021-09-01 DIAGNOSIS — M79674 Pain in right toe(s): Secondary | ICD-10-CM

## 2021-09-01 DIAGNOSIS — B351 Tinea unguium: Secondary | ICD-10-CM | POA: Diagnosis not present

## 2021-09-01 NOTE — Progress Notes (Signed)
This patient returns to my office for at risk foot care.  This patient requires this care by a professional since this patient will be at risk due to having diabetes.  Patient has painful corn fifth toe right foot and painful callus on the outside of both feet  B/L.  This patient is unable to cut nails herself since the patient cannot reach her nails.These nails are painful walking and wearing shoes.  This patient presents for at risk foot care today.  General Appearance  Alert, conversant and in no acute stress.  Vascular  Defeered due to wearing compression socks.  Patient has not been seen for 9 months.  Neurologic  Senn-Weinstein monofilament wire test diminished  bilaterally. Muscle power within normal limits bilaterally.  Nails Thick disfigured discolored nails with subungual debris  from hallux to fifth toes bilaterally. No evidence of bacterial infection or drainage bilaterally.  Orthopedic  No limitations of motion  feet .  No crepitus or effusions noted.  No bony pathology or digital deformities noted.  Adducto varus fifth digit  Right foot.  Plantar flexed fifth metatarsal  B/L.  Skin  normotropic skin with no porokeratosis noted bilaterally.  No signs of infections or ulcers noted. Porokeratosis sub 5th  B/L.  Corn 5th digit right foot.    Onychomycosis  Pain in right toes  Pain in left toes  Porokeratosis B/L  Corn fifth toe right foot.  Consent was obtained for treatment procedures.   Mechanical debridement of nails 1-5  bilaterally performed with a nail nipper.  Filed with dremel without incident. Debridement of callus  Both feet with # 15 blade and dremel tool.    Return office visit   3 months                   Told patient to return for periodic foot care and evaluation due to potential at risk complications.   Conception Doebler DPM  

## 2021-09-06 ENCOUNTER — Other Ambulatory Visit: Payer: Self-pay

## 2021-09-06 ENCOUNTER — Encounter (INDEPENDENT_AMBULATORY_CARE_PROVIDER_SITE_OTHER): Payer: Self-pay | Admitting: Family Medicine

## 2021-09-06 ENCOUNTER — Ambulatory Visit (INDEPENDENT_AMBULATORY_CARE_PROVIDER_SITE_OTHER): Payer: HMO | Admitting: Family Medicine

## 2021-09-06 VITALS — BP 144/61 | HR 68 | Temp 97.6°F | Ht 61.0 in | Wt 288.0 lb

## 2021-09-06 DIAGNOSIS — E559 Vitamin D deficiency, unspecified: Secondary | ICD-10-CM | POA: Diagnosis not present

## 2021-09-06 DIAGNOSIS — E1169 Type 2 diabetes mellitus with other specified complication: Secondary | ICD-10-CM | POA: Diagnosis not present

## 2021-09-06 DIAGNOSIS — Z794 Long term (current) use of insulin: Secondary | ICD-10-CM

## 2021-09-06 DIAGNOSIS — Z6841 Body Mass Index (BMI) 40.0 and over, adult: Secondary | ICD-10-CM | POA: Diagnosis not present

## 2021-09-06 MED ORDER — VITAMIN D (ERGOCALCIFEROL) 1.25 MG (50000 UNIT) PO CAPS: 50000.0000 [IU] | ORAL_CAPSULE | ORAL | 0 refills | Status: DC

## 2021-09-07 LAB — HEMOGLOBIN A1C
Est. average glucose Bld gHb Est-mCnc: 137 mg/dL
Hgb A1c MFr Bld: 6.4 % — ABNORMAL HIGH (ref 4.8–5.6)

## 2021-09-07 LAB — VITAMIN D 25 HYDROXY (VIT D DEFICIENCY, FRACTURES): Vit D, 25-Hydroxy: 39.6 ng/mL (ref 30.0–100.0)

## 2021-09-07 NOTE — Progress Notes (Signed)
Chief Complaint:   OBESITY Whitney [name] is here to discuss her progress with her obesity treatment plan along with follow-up of her obesity related diagnoses. Whitney [name] is on the Category 2 Plan with breakfast options and states she is following her eating plan approximately 60% of the time. Becka states she is not currently exercising.  Today's visit was #: 8 Starting weight: 313 lbs Starting date: 04/06/2021 Today's weight: 288 lbs Today's date: 09/06/2021 Total lbs lost to date: 25 Total lbs lost since last in-office visit: +4  Interim History: Whitney [name] had Thanksgiving and a few funerals to go to. She had to cook for church, the funerals, and Thanksgiving, which caused a lot of temptations and excess eating. She is upset with herself.   Subjective:   1. Type 2 diabetes mellitus with other specified complication, with long-term current use of insulin (HCC) Whitney [name]'s fasting blood sugar was lowest at 88 and highest at 278. She hasn't spoken to her insurance company or PCP.  2. Vitamin D deficiency She is currently taking prescription vitamin D 50,000 IU each week. She denies nausea, vomiting or muscle weakness.  Assessment/Plan:   Orders Placed This Encounter  Procedures   VITAMIN D 25 Hydroxy (Vit-D Deficiency, Fractures)   Hemoglobin Z5G   Basic Metabolic Panel (BMET)    Medications Discontinued During This Encounter  Medication Reason   Vitamin D, Ergocalciferol, (DRISDOL) 1.25 MG (50000 UNIT) CAPS capsule Reorder     Meds ordered this encounter  Medications   Vitamin D, Ergocalciferol, (DRISDOL) 1.25 MG (50000 UNIT) CAPS capsule    Sig: Take 1 capsule (50,000 Units total) by mouth every 7 (seven) days.    Dispense:  4 capsule    Refill:  0    30 d supply; ov for RF     1. Type 2 diabetes mellitus with other specified complication, with long-term current use of insulin (HCC) Good blood sugar control is important to decrease the likelihood of diabetic complications such as  nephropathy, neuropathy, limb loss, blindness, coronary artery disease, and death. Intensive lifestyle modification including diet, exercise and weight loss are the first line of treatment for diabetes.  Call insurance company regarding GLP-1 coverage and cost.  Continue insulin, per PCP for now.  Check labs today.  - Hemoglobin L8V - Basic Metabolic Panel (BMET)  2. Vitamin D deficiency Low Vitamin D level contributes to fatigue and are associated with obesity, breast, and colon cancer. She agrees to continue to take prescription Vitamin D 50,000 IU every week and will follow-up for routine testing of Vitamin D, at least 2-3 times per year to avoid over-replacement.  Refill- Vitamin D, Ergocalciferol, (DRISDOL) 1.25 MG (50000 UNIT) CAPS capsule; Take 1 capsule (50,000 Units total) by mouth every 7 (seven) days.  Dispense: 4 capsule; Refill: 0  - VITAMIN D 25 Hydroxy (Vit-D Deficiency, Fractures)  3. Obesity with current BMI of 54.5  Whitney [name] is currently in the action stage of change. As such, her goal is to continue with weight loss efforts. She has agreed to the Category 2 Plan with breakfast options.   Consider adding GLP-1, if pt agrees, after she speaks with her insurance.  Exercise goals: All adults should avoid inactivity. Some physical activity is better than none, and adults who participate in any amount of physical activity gain some health benefits.  Behavioral modification strategies: meal planning and cooking strategies and avoiding temptations.  Whitney [name] has agreed to follow-up with our clinic in 2-4 weeks. She was informed  of the importance of frequent follow-up visits to maximize her success with intensive lifestyle modifications for her multiple health conditions.   Whitney [name] was informed we would discuss her lab results at her next visit unless there is a critical issue that needs to be addressed sooner. Whitney [name] agreed to keep her next visit at the agreed upon time to discuss these  results.  Objective:   Blood pressure (!) 144/61, pulse 68, temperature 97.6 F (36.4 C), height 5\' 1"  (1.549 m), weight 288 lb (130.6 kg), SpO2 99 %. Body mass index is 54.42 kg/m.  General: Cooperative, alert, well developed, in no acute distress. HEENT: Conjunctivae and lids unremarkable. Cardiovascular: Regular rhythm.  Lungs: Normal work of breathing. Neurologic: No focal deficits.   Lab Results  Component Value Date   CREATININE 0.7 03/29/2021   BUN 15 03/29/2021   NA 140 12/19/2020   K 4.5 03/29/2021   CL 101 12/19/2020   CO2 29 (A) 03/29/2021   Lab Results  Component Value Date   ALT 11 06/26/2019   AST 11 (A) 03/29/2021   ALKPHOS 91 06/26/2019   BILITOT 1.2 06/26/2019   Lab Results  Component Value Date   HGBA1C 6.4 (H) 09/06/2021   HGBA1C 9.4 03/29/2021   HGBA1C 7.5 (H) 06/26/2019   HGBA1C 7.6 (H) 07/12/2017   HGBA1C 7.6 (H) 11/16/2011   No results found for: INSULIN Lab Results  Component Value Date   TSH 3.348 11/16/2011   Lab Results  Component Value Date   CHOL 184 03/29/2021   HDL 52 03/29/2021   LDLCALC 109 03/29/2021   TRIG 127 03/29/2021   Lab Results  Component Value Date   VD25OH 39.6 09/06/2021   VD25OH 9.6 (L) 04/06/2021   Lab Results  Component Value Date   WBC 8.9 03/29/2021   HGB 11.9 (A) 03/29/2021   HCT 36 03/29/2021   MCV 85.0 06/26/2019   PLT 287 03/29/2021   Lab Results  Component Value Date   IRON 68 11/16/2011   TIBC 302 11/16/2011   FERRITIN 253 11/16/2011    Obesity Behavioral Intervention:   Approximately 15 minutes were spent on the discussion below.  ASK: We discussed the diagnosis of obesity with Whitney [name] today and Whitney [name] agreed to give [place] permission to discuss obesity behavioral modification therapy today.  ASSESS: Whitney [name] has the diagnosis of obesity and her BMI today is 54.5. Whitney [name] is in the action stage of change.   ADVISE: Whitney [name] was educated on the multiple health risks of obesity as well as the  benefit of weight loss to improve her health. She was advised of the need for long term treatment and the importance of lifestyle modifications to improve her current health and to decrease her risk of future health problems.  AGREE: Multiple dietary modification options and treatment options were discussed and Shanina agreed to follow the recommendations documented in the above note.  ARRANGE: Paris was educated on the importance of frequent visits to treat obesity as outlined per CMS and USPSTF guidelines and agreed to schedule her next follow up appointment today.  Attestation Statements:   Reviewed by clinician on day of visit: allergies, medications, problem list, medical history, surgical history, family history, social history, and previous encounter notes.  Coral Ceo, CMA, am acting as transcriptionist for [employer], DO.  I have reviewed the above documentation for accuracy and completeness, and I agree with the above. Marjory Sneddon, D.O.  The 21st Century Cures Act was signed into  law in 2016 which includes the topic of electronic health records.  This provides immediate access to information in MyChart.  This includes consultation notes, operative notes, office notes, lab results and pathology reports.  If you have any questions about what you read please let us know at your next visit so we can discuss your concerns and take corrective action if need be.  We are right here with you.

## 2021-09-08 DIAGNOSIS — G4733 Obstructive sleep apnea (adult) (pediatric): Secondary | ICD-10-CM | POA: Diagnosis not present

## 2021-09-21 ENCOUNTER — Other Ambulatory Visit: Payer: HMO

## 2021-10-01 ENCOUNTER — Other Ambulatory Visit (INDEPENDENT_AMBULATORY_CARE_PROVIDER_SITE_OTHER): Payer: Self-pay | Admitting: Family Medicine

## 2021-10-01 DIAGNOSIS — E559 Vitamin D deficiency, unspecified: Secondary | ICD-10-CM

## 2021-10-03 ENCOUNTER — Ambulatory Visit (INDEPENDENT_AMBULATORY_CARE_PROVIDER_SITE_OTHER): Payer: HMO | Admitting: Physician Assistant

## 2021-10-03 ENCOUNTER — Other Ambulatory Visit: Payer: Self-pay

## 2021-10-03 ENCOUNTER — Encounter (INDEPENDENT_AMBULATORY_CARE_PROVIDER_SITE_OTHER): Payer: Self-pay | Admitting: Physician Assistant

## 2021-10-03 VITALS — BP 156/90 | HR 69 | Temp 97.8°F | Ht 61.0 in | Wt 288.0 lb

## 2021-10-03 DIAGNOSIS — E559 Vitamin D deficiency, unspecified: Secondary | ICD-10-CM | POA: Diagnosis not present

## 2021-10-03 DIAGNOSIS — Z6841 Body Mass Index (BMI) 40.0 and over, adult: Secondary | ICD-10-CM

## 2021-10-03 DIAGNOSIS — K5909 Other constipation: Secondary | ICD-10-CM

## 2021-10-03 MED ORDER — VITAMIN D (ERGOCALCIFEROL) 1.25 MG (50000 UNIT) PO CAPS: 50000.0000 [IU] | ORAL_CAPSULE | ORAL | 0 refills | Status: DC

## 2021-10-03 MED ORDER — MAGNESIUM SALICYLATE 325 MG PO TABS: ORAL_TABLET | ORAL | 0 refills | Status: DC

## 2021-10-03 NOTE — Progress Notes (Signed)
Chief Complaint:   OBESITY Whitney [name] is here to discuss her progress with her obesity treatment plan along with follow-up of her obesity related diagnoses. Whitney [name] is on the Category 2 Plan + breakfast options and states she is following her eating plan approximately 40% of the time. Whitney [name] states she is not currently exercising.  Today's visit was #: 9 Starting weight: 313 lbs Starting date: 04/06/2021 Today's weight: 288 lbs Today's date: 10/03/2021 Total lbs lost to date: 25 Total lbs lost since last in-office visit: 0  Interim History: Whitney [name] reports that she had family in town recently and struggled to stay on plan. She eats 2 meals a day. She eats breakfast on plan, an apple for snack, and then dinner, therefore is not getting enough protein during the day.   Subjective:   1. Vitamin D deficiency Pt is on Vit D weekly. Her last level was improved but not at goal.  2. Other constipation Pt reports magnesium salicylate helps with regular BM's. She uses it as needed.  Assessment/Plan:   1. Vitamin D deficiency Low Vitamin D level contributes to fatigue and are associated with obesity, breast, and colon cancer. She agrees to continue to take prescription Vitamin D 50,000 IU every week and will follow-up for routine testing of Vitamin D, at least 2-3 times per year to avoid over-replacement.  Refill- Vitamin D, Ergocalciferol, (DRISDOL) 1.25 MG (50000 UNIT) CAPS capsule; Take 1 capsule (50,000 Units total) by mouth every 7 (seven) days.  Dispense: 4 capsule; Refill: 0  2. Other constipation [place]ona was informed that a decrease in bowel movement frequency is normal while losing weight, but stools should not be hard or painful. Orders and follow up as documented in patient record.   Counseling Getting to Good Bowel Health: Your goal is to have one soft bowel movement each day. Drink at least 8 glasses of water each day. Eat plenty of fiber (goal is over 25 grams each day). It is best to  get most of your fiber from dietary sources which includes leafy green vegetables, fresh fruit, and whole grains. You may need to add fiber with the help of OTC fiber supplements. These include Metamucil, Citrucel, and Flaxseed. If you are still having trouble, try adding Miralax or Magnesium Citrate. If all of these changes do not work, Cabin crew.  Refill- Magnesium Salicylate 762 MG TABS; Take 1-2 tablets by mouth twice daily as needed via CALM supplement  Dispense: 60 tablet; Refill: 0  3. Obesity with current BMI of 54.45  Whitney [name] is currently in the action stage of change. As such, her goal is to continue with weight loss efforts. She has agreed to the Category 2 Plan + breakfast options.   Add 8 oz of Fairlife Milk.  Exercise goals: All adults should avoid inactivity. Some physical activity is better than none, and adults who participate in any amount of physical activity gain some health benefits.  Behavioral modification strategies: meal planning and cooking strategies and keeping healthy foods in the home.  Whitney [name] has agreed to follow-up with our clinic in 2 weeks. She was informed of the importance of frequent follow-up visits to maximize her success with intensive lifestyle modifications for her multiple health conditions.   Objective:   Blood pressure (!) 156/90, pulse 69, temperature 97.8 F (36.6 C), height 5\' 1"  (1.549 m), weight 288 lb (130.6 kg), SpO2 98 %. Body mass index is 54.42 kg/m.  General: Cooperative, alert, well developed, in no acute  distress. HEENT: Conjunctivae and lids unremarkable. Cardiovascular: Regular rhythm.  Lungs: Normal work of breathing. Neurologic: No focal deficits.   Lab Results  Component Value Date   CREATININE 0.7 03/29/2021   BUN 15 03/29/2021   NA 140 12/19/2020   K 4.5 03/29/2021   CL 101 12/19/2020   CO2 29 (A) 03/29/2021   Lab Results  Component Value Date   ALT 11 06/26/2019   AST 11 (A) 03/29/2021   ALKPHOS 91  06/26/2019   BILITOT 1.2 06/26/2019   Lab Results  Component Value Date   HGBA1C 6.4 (H) 09/06/2021   HGBA1C 9.4 03/29/2021   HGBA1C 7.5 (H) 06/26/2019   HGBA1C 7.6 (H) 07/12/2017   HGBA1C 7.6 (H) 11/16/2011   No results found for: INSULIN Lab Results  Component Value Date   TSH 3.348 11/16/2011   Lab Results  Component Value Date   CHOL 184 03/29/2021   HDL 52 03/29/2021   LDLCALC 109 03/29/2021   TRIG 127 03/29/2021   Lab Results  Component Value Date   VD25OH 39.6 09/06/2021   VD25OH 9.6 (L) 04/06/2021   Lab Results  Component Value Date   WBC 8.9 03/29/2021   HGB 11.9 (A) 03/29/2021   HCT 36 03/29/2021   MCV 85.0 06/26/2019   PLT 287 03/29/2021   Lab Results  Component Value Date   IRON 68 11/16/2011   TIBC 302 11/16/2011   FERRITIN 253 11/16/2011    Obesity Behavioral Intervention:   Approximately 15 minutes were spent on the discussion below.  ASK: We discussed the diagnosis of obesity with [name] today and Magalie agreed to give [place] permission to discuss obesity behavioral modification therapy today.  ASSESS: Whitney [name] has the diagnosis of obesity and her BMI today is 54.4. Beronica is in the action stage of change.   ADVISE: Whitney [name] was educated on the multiple health risks of obesity as well as the benefit of weight loss to improve her health. She was advised of the need for long term treatment and the importance of lifestyle modifications to improve her current health and to decrease her risk of future health problems.  AGREE: Multiple dietary modification options and treatment options were discussed and Whitney [name] agreed to follow the recommendations documented in the above note.  ARRANGE: Whitney [name] was educated on the importance of frequent visits to treat obesity as outlined per CMS and USPSTF guidelines and agreed to schedule her next follow up appointment today.  Attestation Statements:   Reviewed by clinician on day of visit: allergies, medications, problem  list, medical history, surgical history, family history, social history, and previous encounter notes.  Coral Ceo, CMA, am acting as transcriptionist for [employer], PA-C.  I have reviewed the above documentation for accuracy and completeness, and I agree with the above. Abby Potash, PA-C

## 2021-10-06 DIAGNOSIS — Z79899 Other long term (current) drug therapy: Secondary | ICD-10-CM | POA: Diagnosis not present

## 2021-10-06 DIAGNOSIS — I872 Venous insufficiency (chronic) (peripheral): Secondary | ICD-10-CM | POA: Diagnosis not present

## 2021-10-06 DIAGNOSIS — Z794 Long term (current) use of insulin: Secondary | ICD-10-CM | POA: Diagnosis not present

## 2021-10-06 DIAGNOSIS — E1169 Type 2 diabetes mellitus with other specified complication: Secondary | ICD-10-CM | POA: Diagnosis not present

## 2021-10-06 DIAGNOSIS — I5022 Chronic systolic (congestive) heart failure: Secondary | ICD-10-CM | POA: Diagnosis not present

## 2021-10-06 DIAGNOSIS — M170 Bilateral primary osteoarthritis of knee: Secondary | ICD-10-CM | POA: Diagnosis not present

## 2021-10-06 DIAGNOSIS — E7800 Pure hypercholesterolemia, unspecified: Secondary | ICD-10-CM | POA: Diagnosis not present

## 2021-10-06 DIAGNOSIS — I10 Essential (primary) hypertension: Secondary | ICD-10-CM | POA: Diagnosis not present

## 2021-10-09 DIAGNOSIS — G4733 Obstructive sleep apnea (adult) (pediatric): Secondary | ICD-10-CM | POA: Diagnosis not present

## 2021-10-10 DIAGNOSIS — Z79899 Other long term (current) drug therapy: Secondary | ICD-10-CM | POA: Diagnosis not present

## 2021-10-17 ENCOUNTER — Ambulatory Visit (INDEPENDENT_AMBULATORY_CARE_PROVIDER_SITE_OTHER): Payer: HMO | Admitting: Physician Assistant

## 2021-10-24 ENCOUNTER — Ambulatory Visit (INDEPENDENT_AMBULATORY_CARE_PROVIDER_SITE_OTHER): Payer: HMO | Admitting: Physician Assistant

## 2021-10-24 ENCOUNTER — Encounter (INDEPENDENT_AMBULATORY_CARE_PROVIDER_SITE_OTHER): Payer: Self-pay | Admitting: Physician Assistant

## 2021-10-24 ENCOUNTER — Other Ambulatory Visit: Payer: Self-pay

## 2021-10-24 VITALS — BP 160/78 | HR 85 | Temp 97.5°F | Ht 61.0 in | Wt 282.0 lb

## 2021-10-24 DIAGNOSIS — E559 Vitamin D deficiency, unspecified: Secondary | ICD-10-CM

## 2021-10-24 DIAGNOSIS — E669 Obesity, unspecified: Secondary | ICD-10-CM | POA: Diagnosis not present

## 2021-10-24 DIAGNOSIS — Z6841 Body Mass Index (BMI) 40.0 and over, adult: Secondary | ICD-10-CM | POA: Diagnosis not present

## 2021-10-24 DIAGNOSIS — K5909 Other constipation: Secondary | ICD-10-CM

## 2021-10-24 MED ORDER — VITAMIN D (ERGOCALCIFEROL) 1.25 MG (50000 UNIT) PO CAPS: 50000.0000 [IU] | ORAL_CAPSULE | ORAL | 0 refills | Status: DC

## 2021-10-24 MED ORDER — MAGNESIUM SALICYLATE 325 MG PO TABS: ORAL_TABLET | ORAL | 0 refills | Status: DC

## 2021-10-24 NOTE — Progress Notes (Signed)
Chief Complaint:   OBESITY [place] is here to discuss her progress with her obesity treatment plan along with follow-up of her obesity related diagnoses. Deneen is on the Category 2 Plan with breakfast options and states she is following her eating plan approximately 40% of the time. Kenidy states she is doing 0 minutes 0 times per week.  Today's visit was #: 10 Starting weight: 313 lbs Starting date: 04/06/2021 Today's weight: 282 lbs Today's date: 10/24/2021 Total lbs lost to date: 31 lbs Total lbs lost since last in-office visit: 6 lbs  Interim History: Staysha states that she doesn't know how she lost weight as  she indulged in 2 candy bars and 1 piece of fried chicken, but otherwise tried to stay on plan.   Subjective:   1. Vitamin D deficiency Kaisy is on Vitamin D weekly currently.  2. Other constipation Yvone is currently taking Magnesium. She states it helps alleviate constipation.  Assessment/Plan:   1. Vitamin D deficiency Low Vitamin D level contributes to fatigue and are associated with obesity, breast, and colon cancer. We will refill prescription Vitamin D 50,000 IU every week for 1 month with no refills and Akili will follow-up for routine testing of Vitamin D, at least 2-3 times per year to avoid over-replacement.  - Vitamin D, Ergocalciferol, (DRISDOL) 1.25 MG (50000 UNIT) CAPS capsule; Take 1 capsule (50,000 Units total) by mouth every 7 (seven) days.  Dispense: 4 capsule; Refill: 0  2. Other constipation We will refill Magesium for 1 month with no refills. Jammi was informed that a decrease in bowel movement frequency is normal while losing weight, but stools should not be hard or painful. Orders and follow up as documented in patient record.   Counseling Getting to Good Bowel Health: Your goal is to have one soft bowel movement each day. Drink at least 8 glasses of water each day. Eat plenty of fiber (goal is over 25 grams each day). It is best to get most of  your fiber from dietary sources which includes leafy green vegetables, fresh fruit, and whole grains. You may need to add fiber with the help of OTC fiber supplements. These include Metamucil, Citrucel, and Flaxseed. If you are still having trouble, try adding Miralax or Magnesium Citrate. If all of these changes do not work, Cabin crew.  - Magnesium Salicylate 701 MG TABS; Take 1-2 tablets by mouth twice daily as needed via CALM supplement  Dispense: 60 tablet; Refill: 0  3. Obesity with current BMI of 54.45 Renesmae is currently in the action stage of change. As such, her goal is to continue with weight loss efforts. She has agreed to the Category 2 Plan with breakfast options.   Exercise goals: No exercise has been prescribed at this time.  Behavioral modification strategies: increasing lean protein intake, decreasing simple carbohydrates, and no skipping meals.  Denesia has agreed to follow-up with our clinic in 2 weeks. She was informed of the importance of frequent follow-up visits to maximize her success with intensive lifestyle modifications for her multiple health conditions.   Objective:   Blood pressure (!) 160/78, pulse 85, temperature (!) 97.5 F (36.4 C), height 5\' 1"  (1.549 m), weight 282 lb (127.9 kg), SpO2 98 %. Body mass index is 53.28 kg/m.  General: Cooperative, alert, well developed, in no acute distress. HEENT: Conjunctivae and lids unremarkable. Cardiovascular: Regular rhythm.  Lungs: Normal work of breathing. Neurologic: No focal deficits.   Lab Results  Component Value Date  CREATININE 0.7 03/29/2021   BUN 15 03/29/2021   NA 140 12/19/2020   K 4.5 03/29/2021   CL 101 12/19/2020   CO2 29 (A) 03/29/2021   Lab Results  Component Value Date   ALT 11 06/26/2019   AST 11 (A) 03/29/2021   ALKPHOS 91 06/26/2019   BILITOT 1.2 06/26/2019   Lab Results  Component Value Date   HGBA1C 6.4 (H) 09/06/2021   HGBA1C 9.4 03/29/2021   HGBA1C 7.5 (H)  06/26/2019   HGBA1C 7.6 (H) 07/12/2017   HGBA1C 7.6 (H) 11/16/2011   No results found for: INSULIN Lab Results  Component Value Date   TSH 3.348 11/16/2011   Lab Results  Component Value Date   CHOL 184 03/29/2021   HDL 52 03/29/2021   LDLCALC 109 03/29/2021   TRIG 127 03/29/2021   Lab Results  Component Value Date   VD25OH 39.6 09/06/2021   VD25OH 9.6 (L) 04/06/2021   Lab Results  Component Value Date   WBC 8.9 03/29/2021   HGB 11.9 (A) 03/29/2021   HCT 36 03/29/2021   MCV 85.0 06/26/2019   PLT 287 03/29/2021   Lab Results  Component Value Date   IRON 68 11/16/2011   TIBC 302 11/16/2011   FERRITIN 253 11/16/2011    Obesity Behavioral Intervention:   Approximately 15 minutes were spent on the discussion below.  ASK: We discussed the diagnosis of obesity with [name] today and Tyashia agreed to give [place] permission to discuss obesity behavioral modification therapy today.  ASSESS: Ashlyne has the diagnosis of obesity and her BMI today is 53.4. Tatyanna is in the action stage of change.   ADVISE: Samaria was educated on the multiple health risks of obesity as well as the benefit of weight loss to improve her health. She was advised of the need for long term treatment and the importance of lifestyle modifications to improve her current health and to decrease her risk of future health problems.  AGREE: Multiple dietary modification options and treatment options were discussed and Steven agreed to follow the recommendations documented in the above note.  ARRANGE: Kolbie was educated on the importance of frequent visits to treat obesity as outlined per CMS and USPSTF guidelines and agreed to schedule her next follow up appointment today.  Attestation Statements:   Reviewed by clinician on day of visit: allergies, medications, problem list, medical history, surgical history, family history, social history, and previous encounter notes.  I, Tonye Pearson, am acting as  Location manager for [employer], PA-C.  I have reviewed the above documentation for accuracy and completeness, and I agree with the above. Abby Potash, PA-C

## 2021-10-27 ENCOUNTER — Ambulatory Visit
Admission: RE | Admit: 2021-10-27 | Discharge: 2021-10-27 | Disposition: A | Discharge status: Home / Self Care | Payer: HMO | Source: Ambulatory Visit | Attending: Family Medicine | Admitting: Family Medicine

## 2021-10-27 DIAGNOSIS — R921 Mammographic calcification found on diagnostic imaging of breast: Secondary | ICD-10-CM | POA: Diagnosis not present

## 2021-11-07 ENCOUNTER — Other Ambulatory Visit: Payer: Self-pay

## 2021-11-07 ENCOUNTER — Encounter (INDEPENDENT_AMBULATORY_CARE_PROVIDER_SITE_OTHER): Payer: Self-pay | Admitting: Physician Assistant

## 2021-11-07 ENCOUNTER — Ambulatory Visit (INDEPENDENT_AMBULATORY_CARE_PROVIDER_SITE_OTHER): Payer: HMO | Admitting: Physician Assistant

## 2021-11-07 VITALS — BP 153/71 | HR 76 | Temp 98.2°F | Ht 61.0 in | Wt 276.0 lb

## 2021-11-07 DIAGNOSIS — I152 Hypertension secondary to endocrine disorders: Secondary | ICD-10-CM | POA: Diagnosis not present

## 2021-11-07 DIAGNOSIS — E1159 Type 2 diabetes mellitus with other circulatory complications: Secondary | ICD-10-CM | POA: Diagnosis not present

## 2021-11-07 DIAGNOSIS — Z6841 Body Mass Index (BMI) 40.0 and over, adult: Secondary | ICD-10-CM

## 2021-11-07 NOTE — Progress Notes (Signed)
Chief Complaint:   OBESITY Whitney [name] is here to discuss her progress with her obesity treatment plan along with follow-up of her obesity related diagnoses. Whitney [name] is on the Category 2 Plan with breakfast options and states she is following her eating plan approximately 60% of the time. Whitney [name] states she is doing 0 minutes 0 times per week.  Today's visit was #: 11 Starting weight: 313 lbs Starting date: 04/06/2021 Today's weight: 276 lbs Today's date: 11/07/2021 Total lbs lost to date: 37 Total lbs lost since last in-office visit: 6  Interim History: Whitney [name] reports that she loves potato chips and indulged in them over 2 days. She is doing well with getting in all of her food from the plan every day.   Subjective:   1. Hypertension associated with type 2 diabetes mellitus ([place]) Whitney [name] did not take her blood pressure medications prior to her office visit. She denies chest pain.  Assessment/Plan:   1. Hypertension associated with type 2 diabetes mellitus ([place]) Whitney [name] is to take her blood pressure medications prior to her visit. She will continue working on healthy weight loss and exercise to improve blood pressure control. We will watch for signs of hypotension as she continues her lifestyle modifications.  2. Obesity with current BMI of 54.45 Whitney [name] is currently in the action stage of change. As such, her goal is to continue with weight loss efforts. She has agreed to the Category 2 Plan with breakfast options.   Exercise goals: No exercise has been prescribed at this time.  Behavioral modification strategies: meal planning and cooking strategies, better snacking choices, and planning for success.  Whitney [name] has agreed to follow-up with our clinic in 2 weeks. She was informed of the importance of frequent follow-up visits to maximize her success with intensive lifestyle modifications for her multiple health conditions.   Objective:   Blood pressure (!) 153/71, pulse 76, temperature 98.2 F  (36.8 C), height 5\' 1"  (1.549 m), weight 276 lb (125.2 kg), SpO2 97 %. Body mass index is 52.15 kg/m.  General: Cooperative, alert, well developed, in no acute distress. HEENT: Conjunctivae and lids unremarkable. Cardiovascular: Regular rhythm.  Lungs: Normal work of breathing. Neurologic: No focal deficits.   Lab Results  Component Value Date   CREATININE 0.7 03/29/2021   BUN 15 03/29/2021   NA 140 12/19/2020   K 4.5 03/29/2021   CL 101 12/19/2020   CO2 29 (A) 03/29/2021   Lab Results  Component Value Date   ALT 11 06/26/2019   AST 11 (A) 03/29/2021   ALKPHOS 91 06/26/2019   BILITOT 1.2 06/26/2019   Lab Results  Component Value Date   HGBA1C 6.4 (H) 09/06/2021   HGBA1C 9.4 03/29/2021   HGBA1C 7.5 (H) 06/26/2019   HGBA1C 7.6 (H) 07/12/2017   HGBA1C 7.6 (H) 11/16/2011   No results found for: INSULIN Lab Results  Component Value Date   TSH 3.348 11/16/2011   Lab Results  Component Value Date   CHOL 184 03/29/2021   HDL 52 03/29/2021   LDLCALC 109 03/29/2021   TRIG 127 03/29/2021   Lab Results  Component Value Date   VD25OH 39.6 09/06/2021   VD25OH 9.6 (L) 04/06/2021   Lab Results  Component Value Date   WBC 8.9 03/29/2021   HGB 11.9 (A) 03/29/2021   HCT 36 03/29/2021   MCV 85.0 06/26/2019   PLT 287 03/29/2021   Lab Results  Component Value Date   IRON 68 11/16/2011   TIBC 302  11/16/2011   FERRITIN 253 11/16/2011    Obesity Behavioral Intervention:   Approximately 15 minutes were spent on the discussion below.  ASK: We discussed the diagnosis of obesity with [name] today and Saliha agreed to give [place] permission to discuss obesity behavioral modification therapy today.  ASSESS: Whitney [name] has the diagnosis of obesity and her BMI today is 52.3. Whitney [name] is in the action stage of change.   ADVISE: Mackynzie was educated on the multiple health risks of obesity as well as the benefit of weight loss to improve her health. She was advised of the need for long term  treatment and the importance of lifestyle modifications to improve her current health and to decrease her risk of future health problems.  AGREE: Multiple dietary modification options and treatment options were discussed and Whitney [name] agreed to follow the recommendations documented in the above note.  ARRANGE: Whitney [name] was educated on the importance of frequent visits to treat obesity as outlined per CMS and USPSTF guidelines and agreed to schedule her next follow up appointment today.  Attestation Statements:   Reviewed by clinician on day of visit: allergies, medications, problem list, medical history, surgical history, family history, social history, and previous encounter notes.   [name], am acting as transcriptionist for [employer], PA-C.  I have reviewed the above documentation for accuracy and completeness, and I agree with the above. Abby Potash, PA-C

## 2021-11-09 DIAGNOSIS — G4733 Obstructive sleep apnea (adult) (pediatric): Secondary | ICD-10-CM | POA: Diagnosis not present

## 2021-11-15 ENCOUNTER — Other Ambulatory Visit (INDEPENDENT_AMBULATORY_CARE_PROVIDER_SITE_OTHER): Payer: Self-pay | Admitting: Physician Assistant

## 2021-11-15 DIAGNOSIS — E559 Vitamin D deficiency, unspecified: Secondary | ICD-10-CM

## 2021-11-30 ENCOUNTER — Telehealth: Payer: Self-pay | Admitting: Cardiology

## 2021-11-30 ENCOUNTER — Emergency Department (HOSPITAL_COMMUNITY)
Admission: EM | Admit: 2021-11-30 | Discharge: 2021-12-01 | Disposition: A | Discharge status: Home / Self Care | Payer: HMO | Attending: Emergency Medicine | Admitting: Emergency Medicine

## 2021-11-30 ENCOUNTER — Ambulatory Visit (INDEPENDENT_AMBULATORY_CARE_PROVIDER_SITE_OTHER): Payer: HMO | Admitting: Physician Assistant

## 2021-11-30 ENCOUNTER — Other Ambulatory Visit: Payer: Self-pay

## 2021-11-30 ENCOUNTER — Encounter (INDEPENDENT_AMBULATORY_CARE_PROVIDER_SITE_OTHER): Payer: Self-pay | Admitting: Physician Assistant

## 2021-11-30 ENCOUNTER — Emergency Department (HOSPITAL_COMMUNITY): Payer: HMO

## 2021-11-30 VITALS — BP 174/84 | HR 66 | Temp 98.0°F | Ht 61.0 in | Wt 285.0 lb

## 2021-11-30 DIAGNOSIS — Z6841 Body Mass Index (BMI) 40.0 and over, adult: Secondary | ICD-10-CM | POA: Diagnosis not present

## 2021-11-30 DIAGNOSIS — E669 Obesity, unspecified: Secondary | ICD-10-CM

## 2021-11-30 DIAGNOSIS — R0789 Other chest pain: Secondary | ICD-10-CM

## 2021-11-30 DIAGNOSIS — E559 Vitamin D deficiency, unspecified: Secondary | ICD-10-CM

## 2021-11-30 DIAGNOSIS — E119 Type 2 diabetes mellitus without complications: Secondary | ICD-10-CM | POA: Diagnosis not present

## 2021-11-30 DIAGNOSIS — I2510 Atherosclerotic heart disease of native coronary artery without angina pectoris: Secondary | ICD-10-CM | POA: Insufficient documentation

## 2021-11-30 DIAGNOSIS — E861 Hypovolemia: Secondary | ICD-10-CM | POA: Diagnosis not present

## 2021-11-30 DIAGNOSIS — I10 Essential (primary) hypertension: Secondary | ICD-10-CM

## 2021-11-30 DIAGNOSIS — Z794 Long term (current) use of insulin: Secondary | ICD-10-CM | POA: Diagnosis not present

## 2021-11-30 DIAGNOSIS — I517 Cardiomegaly: Secondary | ICD-10-CM | POA: Diagnosis not present

## 2021-11-30 DIAGNOSIS — Z7982 Long term (current) use of aspirin: Secondary | ICD-10-CM | POA: Insufficient documentation

## 2021-11-30 DIAGNOSIS — E8770 Fluid overload, unspecified: Secondary | ICD-10-CM

## 2021-11-30 DIAGNOSIS — R600 Localized edema: Secondary | ICD-10-CM | POA: Diagnosis not present

## 2021-11-30 DIAGNOSIS — K5909 Other constipation: Secondary | ICD-10-CM

## 2021-11-30 DIAGNOSIS — R0602 Shortness of breath: Secondary | ICD-10-CM

## 2021-11-30 DIAGNOSIS — I509 Heart failure, unspecified: Secondary | ICD-10-CM | POA: Diagnosis not present

## 2021-11-30 LAB — BASIC METABOLIC PANEL
Anion gap: 8 (ref 5–15)
BUN: 16 mg/dL (ref 8–23)
CO2: 29 mmol/L (ref 22–32)
Calcium: 9.3 mg/dL (ref 8.9–10.3)
Chloride: 102 mmol/L (ref 98–111)
Creatinine, Ser: 0.67 mg/dL (ref 0.44–1.00)
GFR, Estimated: >60 mL/min (ref >60)
Glucose, Bld: 158 mg/dL — ABNORMAL HIGH (ref 70–99)
Potassium: 3.8 mmol/L (ref 3.5–5.1)
Sodium: 139 mmol/L (ref 135–145)

## 2021-11-30 LAB — CBC
HCT: 37.6 % (ref 36.0–46.0)
Hemoglobin: 11.8 g/dL — ABNORMAL LOW (ref 12.0–15.0)
MCH: 27.2 pg (ref 26.0–34.0)
MCHC: 31.4 g/dL (ref 30.0–36.0)
MCV: 86.6 fL (ref 80.0–100.0)
Platelets: 251 10*3/uL (ref 150–400)
RBC: 4.34 MIL/uL (ref 3.87–5.11)
RDW: 13.3 % (ref 11.5–15.5)
WBC: 8.4 10*3/uL (ref 4.0–10.5)
nRBC: 0 % (ref 0.0–0.2)

## 2021-11-30 LAB — TROPONIN I (HIGH SENSITIVITY)
Troponin I (High Sensitivity): 13 ng/L (ref <18)
Troponin I (High Sensitivity): 13 ng/L (ref <18)

## 2021-11-30 LAB — CBG MONITORING, ED: Glucose-Capillary: 136 mg/dL — ABNORMAL HIGH (ref 70–99)

## 2021-11-30 LAB — BRAIN NATRIURETIC PEPTIDE: B Natriuretic Peptide: 103.7 pg/mL — ABNORMAL HIGH (ref 0.0–100.0)

## 2021-11-30 MED ORDER — VITAMIN D (ERGOCALCIFEROL) 1.25 MG (50000 UNIT) PO CAPS: 50000.0000 [IU] | ORAL_CAPSULE | ORAL | 0 refills | Status: DC

## 2021-11-30 NOTE — ED Triage Notes (Signed)
Pt with hx of CHF here from weight loss clinic. Pt has gained 9lbs in 2 weeks and she has missed 2 fluid pills over the last four days. Reports sob with exertion, orthopnea, and "indigestion."  ?

## 2021-11-30 NOTE — Telephone Encounter (Signed)
Spoke with Lisabeth Pick at [employer] [facility] [employer], patient currently at their office for appt. ? ?Lisabeth Pick reports patient is having active CP described as "chest heaviness", denies SOB. Lisabeth Pick reports patient has gained 15lbs of fluid (did not specify in what time frame), also reports that patient has missed 2 days of Lasix and Aldactone. BP today 180/73, and 174/84. Lisabeth Pick states patient reports having a headache today. ? ?Colin Broach that patient needs to be sent to the ED to be evaluated for active chest pain. Lisabeth Pick verbalized understanding and states she will inform the PA and patient. ?

## 2021-11-30 NOTE — ED Provider Notes (Signed)
[facility] EMERGENCY DEPARTMENT Provider Note   CSN: [id number] Arrival date & time: 11/30/21  1314     History  Chief Complaint  Patient presents with   Shortness of Breath    Whitney [name] is a 66 y.o. female.  The history is provided by the patient and medical records. No language interpreter was used.  Shortness of Breath Severity:  Moderate Onset quality:  Gradual Duration:  4 days Timing:  Intermittent Progression:  Waxing and waning Chronicity:  Recurrent Context: not URI   Relieved by:  Nothing Worsened by:  Exertion Ineffective treatments:  None tried Associated symptoms: chest pain (tightness)   Associated symptoms: no abdominal pain, no cough, no diaphoresis, no fever, no headaches, no hemoptysis, no sputum production, no vomiting and no wheezing       Home Medications Prior to Admission medications   Medication Sig Start Date End Date Taking? Authorizing Provider  amLODipine (NORVASC) 5 MG tablet Take 1 tablet (5 mg total) by mouth daily. 06/27/19   Joseph Art, DO  aspirin EC 81 MG tablet Take 1 tablet (81 mg total) by mouth daily. Swallow whole. 06/23/20   Meriam Sprague, MD  BD INSULIN SYRINGE U/F 31G X 5/16" 0.5 ML MISC as directed.  03/02/19   [provider]  blood glucose meter kit and supplies KIT Dispense based on patient and insurance preference. Use up to four times daily as directed. (FOR ICD-9 250.00, 250.01). 09/02/17   Medina-Vargas, Monina C, NP  Chlorphen-Phenyleph-APAP (CORICIDIN D COLD/FLU/SINUS) 2-5-325 MG TABS Take 1 tablet by mouth every 6 (six) hours as needed (for congestion or cold-like symptoms).     [provider]  clobetasol ointment (TEMOVATE) 0.05 % Apply 1 application topically 2 (two) times daily as needed (as directed- avoid open wounds).  07/08/18   [provider]  furosemide (LASIX) 20 MG tablet Take 1 tablet (20 mg total) by mouth daily. 08/29/17   Medina-Vargas, Monina C, NP   gabapentin (NEURONTIN) 300 MG capsule Take 300 mg by mouth See admin instructions. Take 300 mg by mouth two times a day and an additional 300 mg once daily as needed for nerve pain 03/11/19   [provider]  Lancets (ONETOUCH DELICA PLUS LANCET33G) MISC USE TO TEST YOUR BLOOD SUGAR 2 TIMES A DAY 11/23/19   [provider]  latanoprost (XALATAN) 0.005 % ophthalmic solution 1 drop at bedtime. 09/08/19   [provider]  lisinopril (ZESTRIL) 40 MG tablet Take 1 tablet (40 mg total) by mouth daily. 06/28/19   Joseph Art, DO  loratadine (CLARITIN) 10 MG tablet Take 10 mg by mouth daily. 12/07/19   [provider]  Magnesium Salicylate 325 MG TABS Take 1-2 tablets by mouth twice daily as needed via CALM supplement 10/24/21   Alois Cliche, PA-C  metoprolol succinate (TOPROL-XL) 100 MG 24 hr tablet Take 1 tablet (100 mg total) by mouth daily before breakfast. Take with or immediately following a meal. 08/29/17   Medina-Vargas, Monina C, NP  NOVOLIN N 100 UNIT/ML injection Inject 35 Units into the skin 2 (two) times daily after a meal. 35 units in the morning and 25 units at night time. 08/08/18   [provider]  olopatadine (PATANOL) 0.1 % ophthalmic solution Place 1 drop into both eyes 2 (two) times daily. 03/03/20   [provider]  [facility] ULTRA test strip 2 (two) times daily. 01/14/20   [provider]  oxybutynin (DITROPAN) 5 MG  tablet Take 1 tablet (5 mg total) by mouth daily. 08/29/17   Medina-Vargas, Monina C, NP  pantoprazole (PROTONIX) 40 MG tablet Take 1 tablet (40 mg total) by mouth daily. 08/29/17   Medina-Vargas, Monina C, NP  pravastatin (PRAVACHOL) 40 MG tablet Take 1 tablet (40 mg total) by mouth at bedtime. 08/29/17   Medina-Vargas, Monina C, NP  spironolactone (ALDACTONE) 25 MG tablet Take 1 tablet (25 mg total) by mouth daily. 06/23/20 09/21/20  Meriam Sprague, MD  traMADol (ULTRAM) 50 MG tablet Take 2 tablets (100 mg  total) by mouth every 8 (eight) hours as needed for severe pain. 08/29/17   Medina-Vargas, Monina C, NP  Vitamin D, Ergocalciferol, (DRISDOL) 1.25 MG (50000 UNIT) CAPS capsule Take 1 capsule (50,000 Units total) by mouth every 7 (seven) days. 11/30/21   Alois Cliche, PA-C      Allergies    Contrast media [iodinated contrast media], Empagliflozin, and Metformin    Review of Systems   Review of Systems  Constitutional:  Positive for fatigue. Negative for chills, diaphoresis and fever.  HENT:  Negative for congestion.   Eyes:  Negative for visual disturbance.  Respiratory:  Positive for chest tightness and shortness of breath. Negative for cough, hemoptysis, sputum production, wheezing and stridor.   Cardiovascular:  Positive for chest pain (tightness) and leg swelling. Negative for palpitations.  Gastrointestinal:  Negative for abdominal pain, constipation, diarrhea and vomiting.  Genitourinary:  Negative for dysuria and flank pain.  Musculoskeletal:  Negative for back pain.  Neurological:  Negative for dizziness, light-headedness and headaches.  Psychiatric/Behavioral:  Negative for agitation.   All other systems reviewed and are negative.  Physical Exam Updated Vital Signs BP (!) 156/64 (BP Location: Left Arm)   Pulse (!) 46   Temp 98.3 F (36.8 C)   Resp 13   SpO2 100%  Physical Exam Vitals and nursing note reviewed.  Constitutional:      General: She is not in acute distress.    Appearance: She is well-developed. She is not ill-appearing, toxic-appearing or diaphoretic.  HENT:     Head: Normocephalic and atraumatic.  Eyes:     Conjunctiva/sclera: Conjunctivae normal.     Pupils: Pupils are equal, round, and reactive to light.  Cardiovascular:     Rate and Rhythm: Normal rate and regular rhythm.     Pulses: Normal pulses.     Heart sounds: Murmur heard.  Pulmonary:     Effort: Pulmonary effort is normal. No tachypnea or respiratory distress.     Breath sounds: Normal  breath sounds. No decreased breath sounds, wheezing, rhonchi or rales.  Chest:     Chest wall: No tenderness.  Abdominal:     Palpations: Abdomen is soft.     Tenderness: There is no abdominal tenderness.  Musculoskeletal:        General: No swelling.     Cervical back: Normal range of motion and neck supple.     Right lower leg: No tenderness. Edema present.     Left lower leg: No tenderness. Edema present.  Skin:    General: Skin is warm and dry.     Capillary Refill: Capillary refill takes less than 2 seconds.  Neurological:     General: No focal deficit present.     Mental Status: She is alert.  Psychiatric:        Mood and Affect: Mood normal.    ED Results / Procedures / Treatments   Labs (all labs ordered are listed,  but only abnormal results are displayed) Labs Reviewed  BASIC METABOLIC PANEL - Abnormal; Notable for the following components:      Result Value   Glucose, Bld 158 (*)    All other components within normal limits  CBC - Abnormal; Notable for the following components:   Hemoglobin 11.8 (*)    All other components within normal limits  BRAIN NATRIURETIC PEPTIDE - Abnormal; Notable for the following components:   B Natriuretic Peptide 103.7 (*)    All other components within normal limits  CBG MONITORING, ED - Abnormal; Notable for the following components:   Glucose-Capillary 136 (*)    All other components within normal limits  TROPONIN I (HIGH SENSITIVITY)  TROPONIN I (HIGH SENSITIVITY)    EKG EKG Interpretation  Date/Time:  Thursday November 30 2021 13:34:55 EST Ventricular Rate:  54 PR Interval:  186 QRS Duration: 86 QT Interval:  422 QTC Calculation: 400 R Axis:   3 Text Interpretation: Sinus bradycardia Left ventricular hypertrophy with repolarization abnormality ( R in aVL , Cornell product , Romhilt-Estes ) Abnormal ECG When compared to prior, new t wave inversionin lead V6. No STEMI Confirmed by Theda Belfast ([id number]) on 11/30/2021 8:56:39  PM  Radiology DG Chest 2 View  Result Date: 11/30/2021 CLINICAL DATA:  Shortness of breath EXAM: CHEST - 2 VIEW COMPARISON:  AP chest 06/25/2019 FINDINGS: Cardiac silhouette is again moderately enlarged. Mediastinal contours are within normal limits. The lungs are clear. No pleural effusion or pneumothorax. Moderate multilevel degenerative bridging osteophytes of the thoracic spine with relative preservation of the disc spaces (compatible with diffuse idiopathic skeletal hyperostosis). IMPRESSION: Stable moderate cardiomegaly.  No acute lung process. Electronically Signed   By: Neita Garnet M.D.   On: 11/30/2021 14:09    Procedures Procedures    Medications Ordered in ED Medications - No data to display  ED Course/ Medical Decision Making/ A&P                           Medical Decision Making Amount and/or Complexity of Data Reviewed Labs: ordered. Radiology: ordered.    QUIERA BARBERIO is a 66 y.o. female with a past medical history significant for CAD with MI and previous cardiac arrest, CHF, diabetes, and sleep apnea who presents at the direction of her cardiologist for evaluation of chest discomfort and shortness of breath with weight gain.  According to patient, for the last 2 weeks, she has gained approximately 15 pounds of water weight and she does say that she has not been eating as well.  She reports she ate some sodium rich foods and then missed 2 days of her diuretics.  When she told them that she was having exertional short of breath and some chest discomfort, they told her to come in for evaluation.  Patient reports that she is having some more edema in her legs than baseline.  She reports no nausea, vomiting, constipation, diarrhea, or urinary changes.  No fevers, chills, congestion, or cough.  She reports the symptoms are mild and does not feel like when she had the heart attack before.  On my exam, lungs were clear and chest was nontender.  She does have a systolic murmur.   Abdomen nontender.  Patient moving all extremities.  Legs are edematous.  EKG showed no STEMI but did show possible new T wave inversion.  10:54 PM Patient ambulated around the hallway and started having some chest discomfort with some tightness  and mild pressure.  She then laid down and continued.  Patient requested we speak with cardiology given her history of MI and this exertional discomfort.  We will call cardiology to discuss if they feel she needs evaluation or is appropriate for outpatient follow-up.  11:47 PM Spoke with cardiology who agrees patient is safe for discharge home.  They request she double the Lasix for the next 3 days and see her cardiologist this week.  Patient agreed with this plan of care and discharge home.  She understands return precautions and was discharged in good condition.         Final Clinical Impression(s) / ED Diagnoses Final diagnoses:  Shortness of breath  Hypervolemia, unspecified hypervolemia type    Rx / DC Orders ED Discharge Orders     None       Clinical Impression: 1. Shortness of breath   2. Hypervolemia, unspecified hypervolemia type     Disposition: Discharge  Condition: Good  I have discussed the results, Dx and Tx plan with the pt(& family if present). He/she/they expressed understanding and agree(s) with the plan. Discharge instructions discussed at great length. Strict return precautions discussed and pt &/or family have verbalized understanding of the instructions. No further questions at time of discharge.    New Prescriptions   No medications on file    Follow Up: your cardiologist     Darrow Bussing, MD [address] [place] [address] [phone]     [facility] EMERGENCY DEPARTMENT [address] [id number] mc [place] [address] [phone]        Draylen Lobue, Canary Brim, MD 12/01/21 0000

## 2021-11-30 NOTE — ED Notes (Signed)
Pt ambulated with a walker. Pulse 85 and O2 sats 97%. ?

## 2021-11-30 NOTE — Discharge Instructions (Addendum)
Your history, exam, and work-up today are consistent with some fluid overload likely from the combination of decreased diuretic use and the increase sodium from your diet as we discussed.  I spoke to cardiology after your work-up was completed and they feel you are safe for discharge home.  Please double your Lasix for the next 3 days and see cardiology in follow-up.  If any symptoms change or worsen acutely, please return to the nearest emergency department.  ?

## 2021-11-30 NOTE — ED Provider Triage Note (Signed)
Emergency Medicine Provider Triage Evaluation Note ? ?Whitney [name] , a 66 y.o. female  was evaluated in triage.  Pt complains of central chest pain and epigastric pain.  Pain described as constant and sharp.  Unsure whether chest pain is more pain or pressure.  Denies N/V or abdominal pain.  No recent illness.  Hx of MI and cardiac arrest with resuscitation.  Also hx diabetes mellitus and CHF. ? ?Review of Systems  ?Positive: Chest pain ?Negative: N/V ? ?Physical Exam  ?BP (!) 164/75 (BP Location: Left Arm)   Pulse 61   Temp 97.7 ?F (36.5 ?C) (Oral)   Resp (!) 24   SpO2 97%  ?Gen:   Awake, no distress   ?Resp:  Normal effort, CTAB ?MSK:   Moves extremities without difficulty ?Other:  Epigastric pain reproducible/worsens with palpation; chest pain observed as central ? ?Medical Decision Making  ?Medically screening exam initiated at 1:52 PM.  Appropriate orders placed.  Whitney [name] was informed that the remainder of the evaluation will be completed by another provider, this initial triage assessment does not replace that evaluation, and the importance of remaining in the ED until their evaluation is complete. ? ?Labs, imaging, and EKG ordered ?  ?Whitney Rome, PA-C ?56/15/37 1358 ? ?

## 2021-11-30 NOTE — Progress Notes (Signed)
Chief Complaint:   OBESITY Zunaira is here to discuss her progress with her obesity treatment plan along with follow-up of her obesity related diagnoses. Peniel is on the Category 2 Plan with breakfast options and states she is following her eating plan approximately 30% of the time. Daishia states she is doing 0 minutes 0 times per week.  Today's visit was #: 12 Starting weight: 313 lbs Starting date: 04/06/2021 Today's weight: 285 lbs Today's date: 11/30/2021 Total lbs lost to date: 28 lbs Total lbs lost since last in-office visit: 0  Interim History: Shelvy has CHF, has not been weighing herself at home as directed by cardiology. She notes that for the last 2 weeks she has been more short of breath at rest and on exertion, and has had to sleep more elevated than normal. She has BLE edema today. She reports recent "indigestion" and some chest pain that she describes and pressure. She has been eating hot dogs, casseroles and some other high sodium foods that she had at a gathering. She has missed 2 doses of her diuretics. She states that she has been trying to eat on category other than those occasions.   Subjective:   1. Other congestive heart failure ([place]) Breyana has missed 2 fluid pills in the last 4 days. She has a slight headache today and has worsened with bilateral lower extremities edema. She is not weighing herself at home. It is managed by cardiology.  She is sleeping upright the last 7 days. She denies fluid restriction.  2. Other chest pain Elnor last week notes chest pain. She describes as pain, worse after eating and laying down. She took one of her medication (unsure which ) and states pain has decreased but not resolved.   3. Hypertension, unspecified type Mallorey missed 2 out of last 4 days of Aldactone and lasix. She has taken other blood pressure medications as prescribed.   4. Other constipation Alizia's last complete bowel movement was 4 days ago.  She is not taking  magnesium-has not taken in a few weeks. Her last colonoscopy was 2 years ago-normal per [place].   5. Vitamin D deficiency Jahira is on Vitamin D every 7 days and she is taking it as prescribed.   Assessment/Plan:   1. Other congestive heart failure (HCC) Disease process and medications reviewed with an emphasis on salt restriction, regular exercise, and regular weight monitoring.  After speaking with the patient's cardiologist today, they advise that she go to the ER. She declined EMS transport. Follow up with cardiology.  2. Other chest pain We obtained an EKG today. After  speaking with Carollee's cardiologist they recommend sending her to the Emergency Room. Karalyn refuses Emergency Medical Services and voices okay to drive there as it is less < than 1 mile away.   - EKG 12-Lead  3. Hypertension, unspecified type Sacha notes chest pains today. She was sent to the Emergency Room and she will follow up with cardiology. She is working on healthy weight loss and exercise to improve blood pressure control. We will watch for signs of hypotension as she continues her lifestyle modifications. Follow up with cardiology. Do not miss doses of medication. Take as prescribed.   4. Other constipation Rhonda will start magnesium daily. She was informed that a decrease in bowel movement frequency is normal while losing weight, but stools should not be hard or painful. Orders and follow up as documented in patient record.   Counseling Getting to Good Bowel  Health: Your goal is to have one soft bowel movement each day. Drink at least 8 glasses of water each day. Eat plenty of fiber (goal is over 25 grams each day). It is best to get most of your fiber from dietary sources which includes leafy green vegetables, fresh fruit, and whole grains. You may need to add fiber with the help of OTC fiber supplements. These include Metamucil, Citrucel, and Flaxseed. If you are still having trouble, try adding Miralax or  Magnesium Citrate. If all of these changes do not work, [employer].   5. Vitamin D deficiency Low Vitamin D level contributes to fatigue and are associated with obesity, breast, and colon cancer. We will refill prescription Vitamin D 50,000 IU every week for 1 month and Chaselynn will follow-up for routine testing of Vitamin D, at least 2-3 times per year to avoid over-replacement.  - Vitamin D, Ergocalciferol, (DRISDOL) 1.25 MG (50000 UNIT) CAPS capsule; Take 1 capsule (50,000 Units total) by mouth every 7 (seven) days.  Dispense: 4 capsule; Refill: 0  6. Obesity with current BMI of 54.45 Ryka is currently in the action stage of change. As such, her goal is to continue with weight loss efforts. She has agreed to the Category 2 Plan with breakfast options.   Exercise goals: No exercise has been prescribed at this time.  Behavioral modification strategies: decreasing eating out and meal planning and cooking strategies.  Tameca has agreed to follow-up with our clinic in 3 weeks. She was informed of the importance of frequent follow-up visits to maximize her success with intensive lifestyle modifications for her multiple health conditions.   Keairra was informed we would discuss her lab results at her next visit unless there is a critical issue that needs to be addressed sooner. Rianna agreed to keep her next visit at the agreed upon time to discuss these results.  Objective:   Blood pressure (!) 174/84, pulse 66, temperature 98 F (36.7 C), height 5\' 1"  (1.549 m), weight 285 lb (129.3 kg), SpO2 98 %. Body mass index is 53.85 kg/m.  General: Cooperative, alert, well developed, in no acute distress. HEENT: Conjunctivae and lids unremarkable. Cardiovascular: Regular rhythm.  Lungs: Normal work of breathing. Neurologic: No focal deficits.   Lab Results  Component Value Date   CREATININE 0.7 03/29/2021   BUN 15 03/29/2021   NA 140 12/19/2020   K 4.5 03/29/2021   CL 101 12/19/2020    CO2 29 (A) 03/29/2021   Lab Results  Component Value Date   ALT 11 06/26/2019   AST 11 (A) 03/29/2021   ALKPHOS 91 06/26/2019   BILITOT 1.2 06/26/2019   Lab Results  Component Value Date   HGBA1C 6.4 (H) 09/06/2021   HGBA1C 9.4 03/29/2021   HGBA1C 7.5 (H) 06/26/2019   HGBA1C 7.6 (H) 07/12/2017   HGBA1C 7.6 (H) 11/16/2011   No results found for: INSULIN Lab Results  Component Value Date   TSH 3.348 11/16/2011   Lab Results  Component Value Date   CHOL 184 03/29/2021   HDL 52 03/29/2021   LDLCALC 109 03/29/2021   TRIG 127 03/29/2021   Lab Results  Component Value Date   VD25OH 39.6 09/06/2021   VD25OH 9.6 (L) 04/06/2021   Lab Results  Component Value Date   WBC 8.9 03/29/2021   HGB 11.9 (A) 03/29/2021   HCT 36 03/29/2021   MCV 85.0 06/26/2019   PLT 287 03/29/2021   Lab Results  Component Value Date  IRON 68 11/16/2011   TIBC 302 11/16/2011   FERRITIN 253 11/16/2011    Obesity Behavioral Intervention:   Approximately 15 minutes were spent on the discussion below.  ASK: We discussed the diagnosis of obesity with [name] today and Nikitia agreed to give [place] permission to discuss obesity behavioral modification therapy today.  ASSESS: Oneida has the diagnosis of obesity and her BMI today is 53.8. Syd is in the action stage of change.   ADVISE: Shaunda was educated on the multiple health risks of obesity as well as the benefit of weight loss to improve her health. She was advised of the need for long term treatment and the importance of lifestyle modifications to improve her current health and to decrease her risk of future health problems.  AGREE: Multiple dietary modification options and treatment options were discussed and Kenlyn agreed to follow the recommendations documented in the above note.  ARRANGE: Ovella was educated on the importance of frequent visits to treat obesity as outlined per CMS and USPSTF guidelines and agreed to schedule her next follow  up appointment today.  Attestation Statements:   Reviewed by clinician on day of visit: allergies, medications, problem list, medical history, surgical history, family history, social history, and previous encounter notes.  I, Tonye Pearson, am acting as Location manager for [employer], PA-C.  I have reviewed the above documentation for accuracy and completeness, and I agree with the above. Abby Potash, PA-C

## 2021-11-30 NOTE — Telephone Encounter (Signed)
Pt c/o of Chest Pain: STAT if CP now or developed within 24 hours ? ?1. Are you having CP right now? yes ? ?2. Are you experiencing any other symptoms (ex. SOB, nausea, vomiting, sweating)? "Chest heaviness" ? ?3. How long have you been experiencing CP?  ? ?4. Is your CP continuous or coming and going? Whitney [name] is not sure. ? ?5. Have you taken Nitroglycerin? No ? ?[facility]. Complained of Chest Pain/ Chest heaviness during visit. PT does have 15 lbs of fluid weight (facility using bariatric scale)  ?BP 180/73 , 174/84  ? ? ??  ?

## 2021-12-01 NOTE — ED Notes (Signed)
Pt discharged and wheeled out to the lobby in a wheel chair without difficulty. ?

## 2021-12-07 DIAGNOSIS — G4733 Obstructive sleep apnea (adult) (pediatric): Secondary | ICD-10-CM | POA: Diagnosis not present

## 2021-12-08 ENCOUNTER — Ambulatory Visit (INDEPENDENT_AMBULATORY_CARE_PROVIDER_SITE_OTHER): Payer: HMO | Admitting: Physician Assistant

## 2021-12-08 ENCOUNTER — Encounter: Payer: Self-pay | Admitting: Physician Assistant

## 2021-12-08 ENCOUNTER — Other Ambulatory Visit: Payer: Self-pay

## 2021-12-08 VITALS — BP 142/78 | HR 79 | Ht 61.0 in | Wt 283.2 lb

## 2021-12-08 DIAGNOSIS — I10 Essential (primary) hypertension: Secondary | ICD-10-CM | POA: Diagnosis not present

## 2021-12-08 DIAGNOSIS — G4733 Obstructive sleep apnea (adult) (pediatric): Secondary | ICD-10-CM

## 2021-12-08 DIAGNOSIS — I350 Nonrheumatic aortic (valve) stenosis: Secondary | ICD-10-CM | POA: Diagnosis not present

## 2021-12-08 DIAGNOSIS — R0609 Other forms of dyspnea: Secondary | ICD-10-CM | POA: Diagnosis not present

## 2021-12-08 DIAGNOSIS — I2510 Atherosclerotic heart disease of native coronary artery without angina pectoris: Secondary | ICD-10-CM

## 2021-12-08 DIAGNOSIS — E785 Hyperlipidemia, unspecified: Secondary | ICD-10-CM | POA: Diagnosis not present

## 2021-12-08 DIAGNOSIS — R0602 Shortness of breath: Secondary | ICD-10-CM

## 2021-12-08 DIAGNOSIS — Z9989 Dependence on other enabling machines and devices: Secondary | ICD-10-CM | POA: Diagnosis not present

## 2021-12-08 DIAGNOSIS — I5020 Unspecified systolic (congestive) heart failure: Secondary | ICD-10-CM | POA: Diagnosis not present

## 2021-12-08 NOTE — Patient Instructions (Signed)
Medication Instructions:  Your physician recommends that you continue on your current medications as directed. Please refer to the Current Medication list given to you today.  *If you need a refill on your cardiac medications before your next appointment, please call your pharmacy*   Lab Work: BMP in 2 weeks If you have labs (blood work) drawn today and your tests are completely normal, you will receive your results only by: [name] (if you have MyChart) OR [name] in the mail If you have any lab test that is abnormal or we need to change your treatment, we will call you to review the results.   Testing/Procedures: Your physician has requested that you have an echocardiogram. Echocardiography is a painless test that uses sound waves to create images of your heart. It provides your doctor with information about the size and shape of your heart and how well your hearts chambers and valves are working. This procedure takes approximately one hour. There are no restrictions for this procedure.    Follow-Up: At [facility], you and your health needs are our priority.  As part of our continuing mission to provide you with exceptional heart care, we have created designated Provider Care Teams.  These Care Teams include your primary Cardiologist (physician) and Advanced Practice Providers (APPs -  Physician Assistants and Nurse Practitioners) who all work together to provide you with the care you need, when you need it.   Your next appointment:   1 month(s)  The format for your next appointment:   In Person  Provider:   [name], MD or APP  Other Instructions 1.Weigh yourself daily when you first wake up in the morning before you have breakfast and call our office and let us know if you have a weight gain of 2-3 pounds overnight or 5 pounds in a week.  Heart-Healthy Eating Plan Many factors influence your heart (coronary) health, including eating and exercise  habits. Coronary risk increases with abnormal blood fat (lipid) levels. Heart-healthy meal planning includes limiting unhealthy fats, increasing healthy fats, and making other diet and lifestyle changes. What is my plan? Your health care provider may recommend that you: Limit your fat intake to _________% or less of your total calories each day. Limit your saturated fat intake to _________% or less of your total calories each day. Limit the amount of cholesterol in your diet to less than _________ mg per day. What are tips for following this plan? Cooking Cook foods using methods other than frying. Baking, boiling, grilling, and broiling are all good options. Other ways to reduce fat include: Removing the skin from poultry. Removing all visible fats from meats. Steaming vegetables in water or broth. Meal planning  At meals, imagine dividing your plate into fourths: Fill one-half of your plate with vegetables and green salads. Fill one-fourth of your plate with whole grains. Fill one-fourth of your plate with lean protein foods. Eat 4-5 servings of vegetables per day. One serving equals 1 cup raw or cooked vegetable, or 2 cups raw leafy greens. Eat 4-5 servings of fruit per day. One serving equals 1 medium whole fruit,  cup dried fruit,  cup fresh, frozen, or canned fruit, or  cup 100% fruit juice. Eat more foods that contain soluble fiber. Examples include apples, broccoli, carrots, beans, peas, and barley. Aim to get 25-30 g of fiber per day. Increase your consumption of legumes, nuts, and seeds to 4-5 servings per week. One serving of dried beans or  legumes equals  cup cooked, 1 serving of nuts is  cup, and 1 serving of seeds equals 1 tablespoon. Fats Choose healthy fats more often. Choose monounsaturated and polyunsaturated fats, such as olive and canola oils, flaxseeds, walnuts, almonds, and seeds. Eat more omega-3 fats. Choose salmon, mackerel, sardines, tuna, flaxseed oil, and  ground flaxseeds. Aim to eat fish at least 2 times each week. Check food labels carefully to identify foods with trans fats or high amounts of saturated fat. Limit saturated fats. These are found in animal products, such as meats, butter, and cream. Plant sources of saturated fats include palm oil, palm kernel oil, and coconut oil. Avoid foods with partially hydrogenated oils in them. These contain trans fats. Examples are stick margarine, some tub margarines, cookies, crackers, and other baked goods. Avoid fried foods. General information Eat more home-cooked food and less restaurant, buffet, and fast food. Limit or avoid alcohol. Limit foods that are high in starch and sugar. Lose weight if you are overweight. Losing just 5-10% of your body weight can help your overall health and prevent diseases such as diabetes and heart disease. Monitor your salt (sodium) intake, especially if you have high blood pressure. Talk with your health care provider about your sodium intake. Try to incorporate more vegetarian meals weekly. What foods can I eat? Fruits All fresh, canned (in natural juice), or frozen fruits. Vegetables Fresh or frozen vegetables (raw, steamed, roasted, or grilled). Green salads. Grains Most grains. Choose whole wheat and whole grains most of the time. Rice and pasta, including brown rice and pastas made with whole wheat. Meats and other proteins Lean, well-trimmed beef, veal, pork, and lamb. Chicken and [place] without skin. All fish and shellfish. Wild duck, rabbit, pheasant, and venison. Egg whites or low-cholesterol egg substitutes. Dried beans, peas, lentils, and tofu. Seeds and most nuts. Dairy Low-fat or nonfat cheeses, including ricotta and mozzarella. Skim or 1% milk (liquid, powdered, or evaporated). Buttermilk made with low-fat milk. Nonfat or low-fat yogurt. Fats and oils Non-hydrogenated (trans-free) margarines. Vegetable oils, including soybean, sesame, sunflower,  olive, peanut, safflower, corn, canola, and cottonseed. Salad dressings or mayonnaise made with a vegetable oil. Beverages Water (mineral or sparkling). Coffee and tea. Diet carbonated beverages. Sweets and desserts Sherbet, gelatin, and fruit ice. Small amounts of dark chocolate. Limit all sweets and desserts. Seasonings and condiments All seasonings and condiments. The items listed above may not be a complete list of foods and beverages you can eat. Contact a dietitian for more options. What foods are not recommended? Fruits Canned fruit in heavy syrup. Fruit in cream or butter sauce. Fried fruit. Limit coconut. Vegetables Vegetables cooked in cheese, cream, or butter sauce. Fried vegetables. Grains Breads made with saturated or trans fats, oils, or whole milk. Croissants. Sweet rolls. Donuts. High-fat crackers, such as cheese crackers. Meats and other proteins Fatty meats, such as hot dogs, ribs, sausage, bacon, rib-eye roast or steak. High-fat deli meats, such as salami and bologna. Caviar. Domestic duck and goose. Organ meats, such as liver. Dairy Cream, sour cream, cream cheese, and creamed cottage cheese. Whole-milk cheeses. Whole or 2% milk (liquid, evaporated, or condensed). Whole buttermilk. Cream sauce or high-fat cheese sauce. Whole-milk yogurt. Fats and oils Meat fat, or shortening. Cocoa butter, hydrogenated oils, palm oil, coconut oil, palm kernel oil. Solid fats and shortenings, including bacon fat, salt pork, lard, and butter. Nondairy cream substitutes. Salad dressings with cheese or sour cream. Beverages Regular sodas and any drinks with added sugar. Sweets and  desserts Frosting. Pudding. Cookies. Cakes. Pies. Milk chocolate or white chocolate. Buttered syrups. Full-fat ice cream or ice cream drinks. The items listed above may not be a complete list of foods and beverages to avoid. Contact a dietitian for more information. Summary Heart-healthy meal planning includes  limiting unhealthy fats, increasing healthy fats, and making other diet and lifestyle changes. Lose weight if you are overweight. Losing just 5-10% of your body weight can help your overall health and prevent diseases such as diabetes and heart disease. Focus on eating a balance of foods, including fruits and vegetables, low-fat or nonfat dairy, lean protein, nuts and legumes, whole grains, and heart-healthy oils and fats. This information is not intended to replace advice given to you by your health care provider. Make sure you discuss any questions you have with your health care provider. Document Revised: 01/26/2021 Document Reviewed: 01/26/2021 Elsevier Patient Education  2022 [employer].   Low-Sodium Eating Plan Sodium, which is an element that makes up salt, helps you maintain a healthy balance of fluids in your body. Too much sodium can increase your blood pressure and cause fluid and waste to be held in your body. Your health care provider or dietitian may recommend following this plan if you have high blood pressure (hypertension), kidney disease, liver disease, or heart failure. Eating less sodium can help lower your blood pressure, reduce swelling, and protect your heart, liver, and kidneys. What are tips for following this plan? Reading food labels The Nutrition Facts label lists the amount of sodium in one serving of the food. If you eat more than one serving, you must multiply the listed amount of sodium by the number of servings. Choose foods with less than 140 mg of sodium per serving. Avoid foods with 300 mg of sodium or more per serving. Shopping  Look for lower-sodium products, often labeled as "low-sodium" or "no salt added." Always check the sodium content, even if foods are labeled as "unsalted" or "no salt added." Buy fresh foods. Avoid canned foods and pre-made or frozen meals. Avoid canned, cured, or processed meats. Buy breads that have less than 80 mg of sodium per  slice. Cooking  Eat more home-cooked food and less restaurant, buffet, and fast food. Avoid adding salt when cooking. Use salt-free seasonings or herbs instead of table salt or sea salt. Check with your health care provider or pharmacist before using salt substitutes. Cook with plant-based oils, such as canola, sunflower, or olive oil. Meal planning When eating at a restaurant, ask that your food be prepared with less salt or no salt, if possible. Avoid dishes labeled as brined, pickled, cured, smoked, or made with soy sauce, miso, or teriyaki sauce. Avoid foods that contain MSG (monosodium glutamate). MSG is sometimes added to [place] food, bouillon, and some canned foods. Make meals that can be grilled, baked, poached, roasted, or steamed. These are generally made with less sodium. General information Most people on this plan should limit their sodium intake to 1,500-2,000 mg (milligrams) of sodium each day. What foods should I eat? Fruits Fresh, frozen, or canned fruit. Fruit juice. Vegetables Fresh or frozen vegetables. "No salt added" canned vegetables. "No salt added" tomato sauce and paste. Low-sodium or reduced-sodium tomato and vegetable juice. Grains Low-sodium cereals, including oats, puffed wheat and rice, and shredded wheat. Low-sodium crackers. Unsalted rice. Unsalted pasta. Low-sodium bread. Whole-grain breads and whole-grain pasta. Meats and other proteins Fresh or frozen (no salt added) meat, poultry, seafood, and fish. Low-sodium canned tuna and  salmon. Unsalted nuts. Dried peas, beans, and lentils without added salt. Unsalted canned beans. Eggs. Unsalted nut butters. Dairy Milk. Soy milk. Cheese that is naturally low in sodium, such as ricotta cheese, fresh mozzarella, or Swiss cheese. Low-sodium or reduced-sodium cheese. Cream cheese. Yogurt. Seasonings and condiments Fresh and dried herbs and spices. Salt-free seasonings. Low-sodium mustard and ketchup. Sodium-free salad  dressing. Sodium-free light mayonnaise. Fresh or refrigerated horseradish. Lemon juice. Vinegar. Other foods Homemade, reduced-sodium, or low-sodium soups. Unsalted popcorn and pretzels. Low-salt or salt-free chips. The items listed above may not be a complete list of foods and beverages you can eat. Contact a dietitian for more information. What foods should I avoid? Vegetables Sauerkraut, pickled vegetables, and relishes. Olives. [place] fries. Onion rings. Regular canned vegetables (not low-sodium or reduced-sodium). Regular canned tomato sauce and paste (not low-sodium or reduced-sodium). Regular tomato and vegetable juice (not low-sodium or reduced-sodium). Frozen vegetables in sauces. Grains Instant hot cereals. Bread stuffing, pancake, and biscuit mixes. Croutons. Seasoned rice or pasta mixes. Noodle soup cups. Boxed or frozen macaroni and cheese. Regular salted crackers. Self-rising flour. Meats and other proteins Meat or fish that is salted, canned, smoked, spiced, or pickled. Precooked or cured meat, such as sausages or meat loaves. Berniece Salines. Ham. Pepperoni. Hot dogs. Corned beef. Chipped beef. Salt pork. Jerky. Pickled herring. Anchovies and sardines. Regular canned tuna. Salted nuts. Dairy Processed cheese and cheese spreads. Hard cheeses. Cheese curds. Blue cheese. Feta cheese. String cheese. Regular cottage cheese. Buttermilk. Canned milk. Fats and oils Salted butter. Regular margarine. Ghee. Bacon fat. Seasonings and condiments Onion salt, garlic salt, seasoned salt, table salt, and sea salt. Canned and packaged gravies. Worcestershire sauce. Tartar sauce. Barbecue sauce. Teriyaki sauce. Soy sauce, including reduced-sodium. Steak sauce. Fish sauce. Oyster sauce. Cocktail sauce. Horseradish that you find on the shelf. Regular ketchup and mustard. Meat flavorings and tenderizers. Bouillon cubes. Hot sauce. Pre-made or packaged marinades. Pre-made or packaged taco seasonings. Relishes.  Regular salad dressings. Salsa. Other foods Salted popcorn and pretzels. Corn chips and puffs. Potato and tortilla chips. Canned or dried soups. Pizza. Frozen entrees and pot pies. The items listed above may not be a complete list of foods and beverages you should avoid. Contact a dietitian for more information. Summary Eating less sodium can help lower your blood pressure, reduce swelling, and protect your heart, liver, and kidneys. Most people on this plan should limit their sodium intake to 1,500-2,000 mg (milligrams) of sodium each day. Canned, boxed, and frozen foods are high in sodium. Restaurant foods, fast foods, and pizza are also very high in sodium. You also get sodium by adding salt to food. Try to cook at home, eat more fresh fruits and vegetables, and eat less fast food and canned, processed, or prepared foods. This information is not intended to replace advice given to you by your health care provider. Make sure you discuss any questions you have with your health care provider. Document Revised: 10/23/2019 Document Reviewed: 08/19/2019 Elsevier Patient Education  2022 [employer].

## 2021-12-08 NOTE — Progress Notes (Signed)
Office Visit    Patient Name: Whitney [name] Date of Encounter: 12/08/2021  PCP:  [name], [place]  Cardiologist:  [name], MD  Advanced Practice Provider:  No care team member to display Electrophysiologist:  None   Chief Complaint    Whitney [name] is a 66 y.o. female with a hx of systolic heart failure with recovered EF from 30 to 35% to 50% on echocardiogram 06/2020, history of respiratory arrest 2013 resulting in cardiac arrest nonobstructive CAD on cath in 2006, hypertension, OSA on CPAP, DM, GERD, mild AS presents today for hospital follow-up.  Patient was seen by Dr. Johney Frame March 2021.  She did not tolerate Jardiance due to yeast infection and it was too expensive.  Weight loss was recommended.  She was last seen October 2022 and complained of left chest tightness after she eats and thought it was gas.  Also it occurs when she is sleeping upright and has not eaten.  She was going to weight loss center and thinks that she lost about 13 pounds.  She has a strong family history in her brother died at 20 years old from heart trouble and her mother died at 79 from an MI.  Due to multiple risk factors a Lexiscan Myoview was ordered.  Also a repeat echocardiogram was ordered for mild aortic stenosis.  Today, she is still having a dull aching chest pain on her right side.  She says her breathing has been better since increasing her Lasix dose.  She states that sometimes the pain happens at night and it is more of a tightness that will last for about 20 minutes or so and then goes away.  She does have to sleep with multiple pillows at night.  She was in the emergency room back on 11/30/2021 and had gained 15 pounds of water weight over 2 weeks time.  At this time she had admitted to eating some sodium rich foods and missing 2 days of her diuretics.  She was also having some exertional shortness of breath and chest discomfort.  Her Lasix dose  was changed from 20 mg to 40 mg daily.  She does state that her shortness of breath has improved.  Flat troponin and no significant EKG changes.  Lexiscan Myoview back in October was encouraging.  Reviewed with the patient today.  No orthopnea, PND. Reports no palpitations.     Past Medical History    Past Medical History:  Diagnosis Date   Arthritis    left knee,right hip   Back pain    Cardiac arrest (HCC)    CHF (congestive heart failure) (HCC)    Constipation    Depression    Diabetes mellitus    GERD (gastroesophageal reflux disease)    Glaucoma    Glaucoma    H/O cardiac arrest 11/2011   PEA   Heart murmur    Hyperlipidemia    Hypertension    Joint pain    Myocardial infarction [facility]) 2013   Palpitations    Sleep apnea    Bring machine,mask and tubing   Swelling    Past Surgical History:  Procedure Laterality Date   BACK SURGERY     COLONOSCOPY N/A 09/12/2016   Procedure: COLONOSCOPY;  Surgeon: Clarene Essex, MD;  [facility];  Service: Endoscopy;  Laterality: N/A;   CYSTOSCOPY W/ URETERAL STENT PLACEMENT Left 01/04/2013   Procedure: CYSTOSCOPY WITH RETROGRADE PYELOGRAM/URETERAL STENT PLACEMENT;  Surgeon:  Bernestine Amass, MD;  Location: [facility];  Service: Urology;  Laterality: Left;   CYSTOSCOPY WITH RETROGRADE PYELOGRAM, URETEROSCOPY AND STENT PLACEMENT Left 01/26/2013   Procedure: CYSTOSCOPY, JJ STENT REMOVAL, LEFT URETEROSCOPY, ;  Surgeon: Bernestine Amass, MD;  Location: [facility];  Service: Urology;  Laterality: Left;  CYSTO, JJ STENT REMOVAL, LEFT URETEROSCOPY    FOOT SURGERY     HERNIA REPAIR     HOLMIUM LASER APPLICATION Left 03/16/736   Procedure: HOLMIUM LASER LITHOTRIPSY ;  Surgeon: Bernestine Amass, MD;  Location: [facility];  Service: Urology;  Laterality: Left;   KNEE SURGERY     left   PARTIAL HYSTERECTOMY      Allergies  Allergies  Allergen Reactions   Contrast Media [Iodinated Contrast Media] Anaphylaxis   Empagliflozin Other (See Comments)    Metformin Other (See Comments)     EKGs/Labs/Other Studies Reviewed:   The following studies were reviewed today:  Lexiscan Myoview 07/28/2021   The study is normal. The study is low risk.   No ST deviation was noted.   LV perfusion is normal.   Left ventricular function is normal. Nuclear stress EF: 61 %. The left ventricular ejection fraction is normal (55-65%).   Prior study available for comparison from 12/27/2014. There are changes compared to prior study which appear to be resolved.  Echocardiogram 07/28/2021   IMPRESSIONS     1. Mild apical hypokinesis. . Left ventricular ejection fraction, by  estimation, is 55 to 60%. The left ventricle has normal function. The left  ventricular internal cavity size was mildly dilated. There is mild left  ventricular hypertrophy. Left  ventricular diastolic parameters are indeterminate.   2. Right ventricular systolic function is normal. The right ventricular  size is normal.   3. The mitral valve is normal in structure. Mild mitral valve  regurgitation.   4. AV is trileaflet Appears to open well. Peak and mean gradients through  the LV/AV/aorta are 25 and 14 mm Hg respectively Turbulence appears to  start above valve (see aorta) Gradients not signficantly changed from  previous echo . The aortic valve is  tricuspid. Aortic valve regurgitation is trivial. Mild aortic valve  sclerosis is present, with no evidence of aortic valve stenosis.   5. There is a thickening/brightness supravalvularly at sinotublar  junction. Question mild ridge. Turbulence seems to start at this point.   6. The inferior vena cava is dilated in size with <50% respiratory  variability, suggesting right atrial pressure of 15 mmHg.   EKG:  EKG is not ordered today.  No concerning ischemic changes on recent EKG 3/2 Recent Labs: 11/30/2021: B Natriuretic Peptide 103.7; BUN 16; Creatinine, Ser 0.67; Hemoglobin 11.8; Platelets 251; Potassium 3.8; Sodium 139  Recent  Lipid Panel    Component Value Date/Time   CHOL 184 03/29/2021 0000   TRIG 127 03/29/2021 0000   HDL 52 03/29/2021 0000   LDLCALC 109 03/29/2021 0000    Home Medications   Current Meds  Medication Sig   amLODipine (NORVASC) 5 MG tablet Take 1 tablet (5 mg total) by mouth daily.   aspirin EC 81 MG tablet Take 1 tablet (81 mg total) by mouth daily. Swallow whole.   BD INSULIN SYRINGE U/F 31G X 5/16" 0.5 ML MISC as directed.    blood glucose meter kit and supplies KIT Dispense based on patient and insurance preference. Use up to four times daily as directed. (FOR ICD-9 250.00, 250.01).   Chlorphen-Phenyleph-APAP (CORICIDIN D COLD/FLU/SINUS) 2-5-325  MG TABS Take 1 tablet by mouth every 6 (six) hours as needed (for congestion or cold-like symptoms).    clobetasol ointment (TEMOVATE) 8.54 % Apply 1 application topically 2 (two) times daily as needed (as directed- avoid open wounds).    furosemide (LASIX) 20 MG tablet Take 1 tablet (20 mg total) by mouth daily.   gabapentin (NEURONTIN) 300 MG capsule Take 300 mg by mouth See admin instructions. Take 300 mg by mouth two times a day and an additional 300 mg once daily as needed for nerve pain   Lancets (ONETOUCH DELICA PLUS [id number]) MISC USE TO TEST YOUR BLOOD SUGAR 2 TIMES A DAY   latanoprost (XALATAN) 0.005 % ophthalmic solution 1 drop at bedtime.   lisinopril (ZESTRIL) 40 MG tablet Take 1 tablet (40 mg total) by mouth daily.   loratadine (CLARITIN) 10 MG tablet Take 10 mg by mouth daily.   Magnesium Salicylate 381 MG TABS Take 1-2 tablets by mouth twice daily as needed via CALM supplement   metoprolol succinate (TOPROL-XL) 100 MG 24 hr tablet Take 1 tablet (100 mg total) by mouth daily before breakfast. Take with or immediately following a meal.   NOVOLIN N 100 UNIT/ML injection Inject 35 Units into the skin 2 (two) times daily after a meal. 35 units in the morning and 25 units at night time.   olopatadine (PATANOL) 0.1 % ophthalmic solution  Place 1 drop into both eyes 2 (two) times daily.   ONETOUCH ULTRA test strip 2 (two) times daily.   oxybutynin (DITROPAN) 5 MG tablet Take 1 tablet (5 mg total) by mouth daily.   pantoprazole (PROTONIX) 40 MG tablet Take 1 tablet (40 mg total) by mouth daily.   pravastatin (PRAVACHOL) 40 MG tablet Take 1 tablet (40 mg total) by mouth at bedtime.   traMADol (ULTRAM) 50 MG tablet Take 2 tablets (100 mg total) by mouth every 8 (eight) hours as needed for severe pain.   Vitamin D, Ergocalciferol, (DRISDOL) 1.25 MG (50000 UNIT) CAPS capsule Take 1 capsule (50,000 Units total) by mouth every 7 (seven) days.     Review of Systems      All other systems reviewed and are otherwise negative except as noted above.  Physical Exam    VS:  BP (!) 142/78    Pulse 79    Ht _0  (1.549 m)    Wt 283 lb 3.2 oz (128.5 kg)    SpO2 96%    BMI 53.51 kg/m  , BMI Body mass index is 53.51 kg/m.  Wt Readings from Last 3 Encounters:  12/08/21 283 lb 3.2 oz (128.5 kg)  11/30/21 285 lb (129.3 kg)  11/07/21 276 lb (125.2 kg)     GEN: Well nourished, well developed, in no acute distress. HEENT: normal. Neck: Supple, no JVD, carotid bruits, or masses. Cardiac: RRR, + systolic murmur, rubs, or gallops. No clubbing, cyanosis, 1+ pitting edema in lower ext.  Radials/PT 2+ and equal bilaterally.  Respiratory:  Respirations regular and unlabored, clear to auscultation bilaterally. GI: Soft, nontender, nondistended. MS: No deformity or atrophy. Skin: Warm and dry, no rash. Neuro:  Strength and sensation are intact. Psych: Normal affect.  Assessment & Plan    History of systolic CHF with improved LVEF 55 to 60% by recent echocardiogram -Mild mitral regurgitation noted as well as mild sclerosis.  Mild aortic valve sclerosis and trivial aortic regurgitation -Since her symptoms have progressed since her last echocardiogram in October we will plan to repeat her echocardiogram  today -Continue Lasix 40 mg daily,  spironolactone 25 mg daily, Norvasc 5 mg daily, aspirin 81 mg daily, lisinopril 40 mg daily, and metoprolol succinate 100 mg daily -BMP in 2 weeks -Continue low-sodium heart healthy diet -Continue to wear compression socks/hose daily -Elevate feet when he can -Keep track of your weight and if you gain more than 2 to 3 pounds overnight or 5 pounds in a week give [place] a call  Chest tightness at rest but not exertional with recent [place] that was normal -Non-obstructive CAD on last cardiac cath -Seems atypical for cardiac origin -We will start with an echocardiogram and pursue an ischemic work-up from there if indicated  Hypertension -Moderately controlled today, 142/78 -continue to monitor your BP at home  Hyperlipidemia -Last LDL 109 which is above goal -Goal would be < 70 due to known CAD -Lipid panel at next appointment with appropriate medication titration  OSA compliant with CPAP  Morbid obesity -Healthy weight and wellness clinic -Heart healthy, low-sodium diet -Continue exercise routine    Disposition: Follow up 1 month with [name], MD or APP.  Signed, Elgie Collard, PA-C 12/08/2021, 2:18 PM [facility]

## 2021-12-20 ENCOUNTER — Ambulatory Visit (INDEPENDENT_AMBULATORY_CARE_PROVIDER_SITE_OTHER): Payer: HMO | Admitting: Physician Assistant

## 2021-12-20 ENCOUNTER — Other Ambulatory Visit: Payer: Self-pay

## 2021-12-20 ENCOUNTER — Encounter (INDEPENDENT_AMBULATORY_CARE_PROVIDER_SITE_OTHER): Payer: Self-pay | Admitting: Physician Assistant

## 2021-12-20 VITALS — BP 143/65 | HR 79 | Temp 98.2°F | Ht 61.0 in | Wt 276.0 lb

## 2021-12-20 DIAGNOSIS — Z6841 Body Mass Index (BMI) 40.0 and over, adult: Secondary | ICD-10-CM | POA: Diagnosis not present

## 2021-12-20 DIAGNOSIS — E559 Vitamin D deficiency, unspecified: Secondary | ICD-10-CM | POA: Diagnosis not present

## 2021-12-20 DIAGNOSIS — E669 Obesity, unspecified: Secondary | ICD-10-CM | POA: Diagnosis not present

## 2021-12-20 MED ORDER — VITAMIN D (ERGOCALCIFEROL) 1.25 MG (50000 UNIT) PO CAPS: 50000.0000 [IU] | ORAL_CAPSULE | ORAL | 0 refills | Status: DC

## 2021-12-21 ENCOUNTER — Ambulatory Visit (INDEPENDENT_AMBULATORY_CARE_PROVIDER_SITE_OTHER): Payer: HMO | Admitting: Physician Assistant

## 2021-12-22 ENCOUNTER — Ambulatory Visit (HOSPITAL_COMMUNITY): Payer: HMO | Attending: Internal Medicine

## 2021-12-22 ENCOUNTER — Other Ambulatory Visit: Payer: HMO | Admitting: *Deleted

## 2021-12-22 ENCOUNTER — Other Ambulatory Visit: Payer: Self-pay

## 2021-12-22 DIAGNOSIS — E785 Hyperlipidemia, unspecified: Secondary | ICD-10-CM

## 2021-12-22 DIAGNOSIS — G4733 Obstructive sleep apnea (adult) (pediatric): Secondary | ICD-10-CM

## 2021-12-22 DIAGNOSIS — I350 Nonrheumatic aortic (valve) stenosis: Secondary | ICD-10-CM

## 2021-12-22 DIAGNOSIS — I5020 Unspecified systolic (congestive) heart failure: Secondary | ICD-10-CM

## 2021-12-22 DIAGNOSIS — R0609 Other forms of dyspnea: Secondary | ICD-10-CM | POA: Insufficient documentation

## 2021-12-22 DIAGNOSIS — R0602 Shortness of breath: Secondary | ICD-10-CM | POA: Insufficient documentation

## 2021-12-22 DIAGNOSIS — I2510 Atherosclerotic heart disease of native coronary artery without angina pectoris: Secondary | ICD-10-CM | POA: Diagnosis not present

## 2021-12-22 DIAGNOSIS — I10 Essential (primary) hypertension: Secondary | ICD-10-CM | POA: Diagnosis not present

## 2021-12-22 DIAGNOSIS — Z9989 Dependence on other enabling machines and devices: Secondary | ICD-10-CM | POA: Diagnosis not present

## 2021-12-22 LAB — BASIC METABOLIC PANEL
BUN/Creatinine Ratio: 27 (ref 12–28)
BUN: 22 mg/dL (ref 8–27)
CO2: 29 mmol/L (ref 20–29)
Calcium: 9.7 mg/dL (ref 8.7–10.3)
Chloride: 100 mmol/L (ref 96–106)
Creatinine, Ser: 0.81 mg/dL (ref 0.57–1.00)
Glucose: 128 mg/dL — ABNORMAL HIGH (ref 70–99)
Potassium: 4 mmol/L (ref 3.5–5.2)
Sodium: 140 mmol/L (ref 134–144)
eGFR: 80 mL/min/{1.73_m2} (ref >59)

## 2021-12-22 LAB — ECHOCARDIOGRAM COMPLETE
AV Mean grad: 15 mmHg
AV Peak grad: 27 mmHg
Ao pk vel: 2.6 m/s
Area-P 1/2: 2.99 cm2
P 1/2 time: 450 msec
S' Lateral: 3.5 cm

## 2021-12-22 NOTE — Progress Notes (Signed)
? ? ? ?Chief Complaint:  ? ?OBESITY ?Kevin is here to discuss her progress with her obesity treatment plan along with follow-up of her obesity related diagnoses. Elisabetta is on the Category 2 Plan with breakfast options and states she is following her eating plan approximately 60% of the time. Mackynzie states she is doing 0 minutes 0 times per week. ? ?Today's visit was #: 13 ?Starting weight: 313 lbs ?Starting date: 04/06/2021 ?Today's weight: 276 lbs ?Today's date: 12/20/2021 ?Total lbs lost to date: 37 lbs ?Total lbs lost since last in-office visit: 9 lbs ? ?Interim History: Since seeing her cardiologist, she has decreased her sodium intake and stopped drinking diet [facility]. She is following up with her cardiologist for congestive heart failure and she will have an echo in 2 days. She is feeling better overall and following the plan.  ? ?Subjective:  ? ?1. Vitamin D deficiency ?Nesiah is on Vitamin D currently. She denies nausea, vomiting, and muscle weakness .  ? ?Assessment/Plan:  ? ?1. Vitamin D deficiency ?Low Vitamin D level contributes to fatigue and are associated with obesity, breast, and colon cancer. We will refill prescription Vitamin D 50,000 IU every week for 1 month with no refills and Destini will follow-up for routine testing of Vitamin D, at least 2-3 times per year to avoid over-replacement. ? ?- Vitamin D, Ergocalciferol, (DRISDOL) 1.25 MG (50000 UNIT) CAPS capsule; Take 1 capsule (50,000 Units total) by mouth every 7 (seven) days.  Dispense: 4 capsule; Refill: 0 ? ?2. Obesity with current BMI of 54.45 ?Dulcemaria is currently in the action stage of change. As such, her goal is to continue with weight loss efforts. She has agreed to the Category 2 Plan with breakfast options.  ? ?Exercise goals: No exercise has been prescribed at this time. ? ?Behavioral modification strategies: meal planning and cooking strategies and keeping healthy foods in the home. ? ?Jolayne has agreed to follow-up with our clinic in  2 weeks. She was informed of the importance of frequent follow-up visits to maximize her success with intensive lifestyle modifications for her multiple health conditions.  ? ?Objective:  ? ?Blood pressure (!) 143/65, pulse 79, temperature 98.2 ?F (36.8 ?C), height '5\' 1"'$  (1.549 m), weight 276 lb (125.2 kg), SpO2 96 %. ?Body mass index is 52.15 kg/m?. ? ?General: Cooperative, alert, well developed, in no acute distress. ?HEENT: Conjunctivae and lids unremarkable. ?Cardiovascular: Regular rhythm.  ?Lungs: Normal work of breathing. ?Neurologic: No focal deficits.  ? ?Lab Results  ?Component Value Date  ? CREATININE 0.81 12/22/2021  ? BUN 22 12/22/2021  ? NA 140 12/22/2021  ? K 4.0 12/22/2021  ? CL 100 12/22/2021  ? CO2 29 12/22/2021  ? ?Lab Results  ?Component Value Date  ? ALT 11 06/26/2019  ? AST 11 (A) 03/29/2021  ? ALKPHOS 91 06/26/2019  ? BILITOT 1.2 06/26/2019  ? ?Lab Results  ?Component Value Date  ? HGBA1C 6.4 (H) 09/06/2021  ? HGBA1C 9.4 03/29/2021  ? HGBA1C 7.5 (H) 06/26/2019  ? HGBA1C 7.6 (H) 07/12/2017  ? HGBA1C 7.6 (H) 11/16/2011  ? ?No results found for: INSULIN ?Lab Results  ?Component Value Date  ? TSH 3.348 11/16/2011  ? ?Lab Results  ?Component Value Date  ? CHOL 184 03/29/2021  ? HDL 52 03/29/2021  ? [place] 109 03/29/2021  ? TRIG 127 03/29/2021  ? ?Lab Results  ?Component Value Date  ? VD25OH 39.6 09/06/2021  ? VD25OH 9.6 (L) 04/06/2021  ? ?Lab Results  ?Component  Value Date  ? WBC 8.4 11/30/2021  ? HGB 11.8 (L) 11/30/2021  ? HCT 37.6 11/30/2021  ? MCV 86.6 11/30/2021  ? PLT 251 11/30/2021  ? ?Lab Results  ?Component Value Date  ? IRON 68 11/16/2011  ? TIBC 302 11/16/2011  ? FERRITIN 253 11/16/2011  ? ?Attestation Statements:  ? ?Reviewed by clinician on day of visit: allergies, medications, problem list, medical history, surgical history, family history, social history, and previous encounter notes. ? ?I, Tonye Pearson, am acting as Location manager for [employer], PA-C. ? ?I have reviewed  the above documentation for accuracy and completeness, and I agree with the above. Abby Potash, PA-C ? ?

## 2021-12-24 ENCOUNTER — Other Ambulatory Visit (INDEPENDENT_AMBULATORY_CARE_PROVIDER_SITE_OTHER): Payer: Self-pay | Admitting: Physician Assistant

## 2021-12-24 DIAGNOSIS — E559 Vitamin D deficiency, unspecified: Secondary | ICD-10-CM

## 2021-12-25 ENCOUNTER — Telehealth: Payer: Self-pay | Admitting: Family

## 2021-12-25 NOTE — Telephone Encounter (Signed)
Loel Dubonnet, NP  ?12/23/2021  4:24 PM EDT   ?  ?Echocardiogram shows normal heart pumping function. Mild thickness and stiffness of heart muscle. Continue optimal BP control to prevent progression. Mild stenosis of the aortic valve. Recommend repeat echocardiogram in one year.  ? ?The patient has been notified of the result and verbalized understanding.  All questions (if any) were answered. ?Antonieta Iba, RN 12/25/2021 11:56 AM  ? ?

## 2021-12-25 NOTE — Telephone Encounter (Signed)
Patient returning call for lab results. 

## 2021-12-27 DIAGNOSIS — I10 Essential (primary) hypertension: Secondary | ICD-10-CM | POA: Diagnosis not present

## 2021-12-27 DIAGNOSIS — E1165 Type 2 diabetes mellitus with hyperglycemia: Secondary | ICD-10-CM | POA: Diagnosis not present

## 2021-12-27 DIAGNOSIS — I5022 Chronic systolic (congestive) heart failure: Secondary | ICD-10-CM | POA: Diagnosis not present

## 2021-12-27 DIAGNOSIS — K219 Gastro-esophageal reflux disease without esophagitis: Secondary | ICD-10-CM | POA: Diagnosis not present

## 2022-01-07 DIAGNOSIS — G4733 Obstructive sleep apnea (adult) (pediatric): Secondary | ICD-10-CM | POA: Diagnosis not present

## 2022-01-08 NOTE — Progress Notes (Deleted)
?Cardiology Office Note:   ? ?Date:  01/08/2022  ? ?ID:  Whitney [name], DOB [birth year], MRN [id number] ? ?PCP:  [name], MD ?  ?[place]  ?Cardiologist:  [name], MD  ?Advanced Practice Provider:  No care team member to display ?Electrophysiologist:  None  ? ? ?Referring MD: [name], MD  ? ? ? ?History of Present Illness:   ? ?Whitney [name] is a 66 y.o. female with a hx of systolic heart failure with recovered EF from 30-35% to 50% on most recent TTE on 06/16/20, history of respiratory arrest in 2013 which resulted in a cardiac arrest, non-obstructive CAD on cath 2006, hypertension, hyperlipidemia, diabetes mellitus on insulin, obstructive sleep apnea on CPAP and morbid obesity who presents to clinic for follow-up. ? ?Patient was admitted to [facility] in September 2020 where she had presented with chest pain. During that admission, echo demonstrated LVEF 55-60% which was improved from prior imaging.  High-sensitivity troponin was negative x3, BNP was normal.  Given reassuring work-up, it was determined that her chest pain was likely noncardiac in etiology and the patient was discharged home. ? ?Last seen in clinic by Ermalinda Barrios on 07/2021 where she was having left chest tightness after eating thought to be due to gas. She was otherwise working on weight loss.  ? ?Today, ? ?Past Medical History:  ?Diagnosis Date  ? Arthritis   ? left knee,right hip  ? Back pain   ? Cardiac arrest [facility])   ? CHF (congestive heart failure) ([place])   ? Constipation   ? Depression   ? Diabetes mellitus   ? GERD (gastroesophageal reflux disease)   ? Glaucoma   ? Glaucoma   ? H/O cardiac arrest 11/2011  ? PEA  ? Heart murmur   ? Hyperlipidemia   ? Hypertension   ? Joint pain   ? Myocardial infarction [facility]) 2013  ? Palpitations   ? Sleep apnea   ? Bring machine,mask and tubing  ? Swelling   ? ? ?Past Surgical History:  ?Procedure Laterality Date  ? BACK SURGERY    ? COLONOSCOPY  N/A 09/12/2016  ? Procedure: COLONOSCOPY;  Surgeon: Clarene Essex, MD;  [facility];  Service: Endoscopy;  Laterality: N/A;  ? CYSTOSCOPY W/ URETERAL STENT PLACEMENT Left 01/04/2013  ? Procedure: CYSTOSCOPY WITH RETROGRADE PYELOGRAM/URETERAL STENT PLACEMENT;  Surgeon: Bernestine Amass, MD;  Location[facility];  Service: Urology;  Laterality: Left;  ? CYSTOSCOPY WITH RETROGRADE PYELOGRAM, URETEROSCOPY AND STENT PLACEMENT Left 01/26/2013  ? Procedure: CYSTOSCOPY, JJ STENT REMOVAL, LEFT URETEROSCOPY, ;  Surgeon: Bernestine Amass, MD;  Location[facility];  Service: Urology;  Laterality: Left;  CYSTO, JJ STENT REMOVAL, LEFT URETEROSCOPY ?  ? FOOT SURGERY    ? HERNIA REPAIR    ? HOLMIUM LASER APPLICATION Left 01/27/7680  ? Procedure: HOLMIUM LASER LITHOTRIPSY ;  Surgeon: Bernestine Amass, MD;  Location[facility];  Service: Urology;  Laterality: Left;  ? KNEE SURGERY    ? left  ? PARTIAL HYSTERECTOMY    ? ? ?Current Medications: ?No outpatient medications have been marked as taking for the 01/16/22 encounter (Appointment) with [name], MD.  ?  ? ?Allergies:   Contrast media [iodinated contrast media], Empagliflozin, and Metformin  ? ?Social History  ? ?Socioeconomic History  ? Marital status: Divorced  ?  Spouse name: Not on file  ? Number of children: Not on file  ? Years of education:  Not on file  ? Highest education level: Not on file  ?Occupational History  ? Occupation: Retired  ?Tobacco Use  ? Smoking status: Never  ? Smokeless tobacco: Never  ?Vaping Use  ? Vaping Use: Never used  ?Substance and Sexual Activity  ? Alcohol use: No  ? Drug use: No  ? Sexual activity: Never  ?Other Topics Concern  ? Not on file  ?Social History Narrative  ? Not on file  ? ?Social Determinants of Health  ? ?Financial Resource Strain: Not on file  ?Food Insecurity: Not on file  ?Transportation Needs: Not on file  ?Physical Activity: Not on file  ?Stress: Not on file  ?Social Connections: Not on file  ?  ? ?Family History: ?The  patient's family history includes Anxiety disorder in her father; Asthma in her mother; Depression in her father; Diabetes in her father and mother; Heart disease in her father and mother; Hyperlipidemia in her father and mother; Hypertension in her father and mother; Kidney disease in her mother; Obesity in her mother. ? ?ROS:   ?Please see the history of present illness.    ?Review of Systems  ?Constitutional:  Positive for malaise/fatigue. Negative for chills and fever.  ?HENT:  Negative for hearing loss and sore throat.   ?Eyes:  Negative for blurred vision and redness.  ?Respiratory:  Positive for shortness of breath.   ?Cardiovascular:  Negative for chest pain, palpitations, orthopnea, claudication, leg swelling and PND.  ?Gastrointestinal:  Negative for melena, nausea and vomiting.  ?Genitourinary:  Negative for dysuria and flank pain.  ?Musculoskeletal:  Positive for falls and myalgias.  ?Neurological:  Negative for dizziness and loss of consciousness.  ?Endo/Heme/Allergies:  Positive for environmental allergies.  ?Psychiatric/Behavioral:  Negative for substance abuse.   ? ?EKGs/Labs/Other Studies Reviewed:   ? ?The following studies were reviewed today: ?Nuclear stress 12/26/2015: ?Nuclear stress EF: 60%. ?There was no ST segment deviation noted during stress. ?Defect 1: There is a small defect of moderate severity present in the basal inferolateral and mid inferolateral location. ?Findings consistent with prior myocardial infarction. ?This is a low risk study. ?The left ventricular ejection fraction is normal (55-65%). ?  ?  ?TTE 06/26/19: ?1. Left ventricular ejection fraction, by visual estimation, is 55 to  ?60%. The left ventricle has normal function. Moderately increased left  ?ventricular size. There is no left ventricular hypertrophy.  ? 2. Definity contrast agent was given IV to delineate the left ventricular  ?endocardial borders.  ? 3. Elevated mean left atrial pressure.  ? 4. Left ventricular  diastolic Doppler parameters are consistent with  ?impaired relaxation pattern of LV diastolic filling.  ? 5. Global right ventricle has normal systolic function.The right  ?ventricular size is normal. No increase in right ventricular wall  ?thickness.  ? 6. The mitral valve is normal in structure. Mild mitral valve  ?regurgitation.  ?  ?TTE 06/06/20: ?1. Left ventricular ejection fraction, by estimation, is 50%. The left  ?ventricle has mildly decreased function. The left ventricle demonstrates  ?global hypokinesis. There is mild left ventricular hypertrophy. Left  ?ventricular diastolic parameters are  ?consistent with Grade I diastolic dysfunction (impaired relaxation).  ? 2. Right ventricular systolic function is normal. The right ventricular  ?size is normal. Tricuspid regurgitation signal is inadequate for assessing  ?PA pressure.  ? 3. Left atrial size was mildly dilated.  ? 4. The mitral valve is normal in structure. No evidence of mitral valve  ?regurgitation. No evidence of mitral stenosis.  ?  5. The aortic valve is tricuspid. Aortic valve regurgitation is not  ?visualized. Mild aortic valve stenosis. Aortic valve mean gradient  ?measures 9.0 mmHg (gradient not significantly elevated but visually at  ?least mild AS).  ? 6. The inferior vena cava is dilated in size with >50% respiratory  ?variability, suggesting right atrial pressure of 8 mmHg. ? ? ?Recent Labs: ?11/30/2021: B Natriuretic Peptide 103.7; Hemoglobin 11.8; Platelets 251 ?12/22/2021: BUN 22; Creatinine, Ser 0.81; Potassium 4.0; Sodium 140  ?Recent Lipid Panel ?   ?Component Value Date/Time  ? CHOL 184 03/29/2021 0000  ? TRIG 127 03/29/2021 0000  ? HDL 52 03/29/2021 0000  ? [place] 109 03/29/2021 0000  ? ? ? ?Physical Exam:   ? ?VS:  There were no vitals taken for this visit.   ? ?Wt Readings from Last 3 Encounters:  ?12/20/21 276 lb (125.2 kg)  ?12/08/21 283 lb 3.2 oz (128.5 kg)  ?11/30/21 285 lb (129.3 kg)  ?  ? ?GEN:  Well nourished, well  developed in no acute distress ?HEENT: Normal ?NECK: No JVD; No carotid bruits ?CARDIAC: RRR, no murmurs, rubs, gallops ?RESPIRATORY:  Clear to auscultation without rales, wheezing or rhonchi  ?ABDOMEN: Obese, soft

## 2022-01-09 ENCOUNTER — Ambulatory Visit (INDEPENDENT_AMBULATORY_CARE_PROVIDER_SITE_OTHER): Payer: HMO | Admitting: Physician Assistant

## 2022-01-09 ENCOUNTER — Encounter (INDEPENDENT_AMBULATORY_CARE_PROVIDER_SITE_OTHER): Payer: Self-pay | Admitting: Physician Assistant

## 2022-01-09 VITALS — BP 96/72 | HR 65 | Temp 98.2°F | Ht 61.0 in | Wt 285.0 lb

## 2022-01-09 DIAGNOSIS — E669 Obesity, unspecified: Secondary | ICD-10-CM | POA: Diagnosis not present

## 2022-01-09 DIAGNOSIS — E559 Vitamin D deficiency, unspecified: Secondary | ICD-10-CM | POA: Diagnosis not present

## 2022-01-09 DIAGNOSIS — I509 Heart failure, unspecified: Secondary | ICD-10-CM

## 2022-01-09 DIAGNOSIS — K5909 Other constipation: Secondary | ICD-10-CM | POA: Diagnosis not present

## 2022-01-09 DIAGNOSIS — Z6841 Body Mass Index (BMI) 40.0 and over, adult: Secondary | ICD-10-CM | POA: Diagnosis not present

## 2022-01-09 MED ORDER — VITAMIN D (ERGOCALCIFEROL) 1.25 MG (50000 UNIT) PO CAPS: 50000.0000 [IU] | ORAL_CAPSULE | ORAL | 0 refills | Status: DC

## 2022-01-15 NOTE — Progress Notes (Signed)
? ? ? ?Chief Complaint:  ? ?OBESITY ?Whitney [name] is here to discuss her progress with her obesity treatment plan along with follow-up of her obesity related diagnoses. Whitney [name] is on the Category 2 Plan with breakfast options and states she is following her eating plan approximately 10% of the time. Whitney [name] states she is doing 0 minutes 0 times per week. ? ?Today's visit was #: 14 ?Starting weight: 313 lbs ?Starting date: 04/06/2021 ?Today's weight: 285 lbs ?Today's date: 01/09/2022 ?Total lbs lost to date: 28 lbs ?Total lbs lost since last in-office visit: 0 ? ?Interim History: Whitney [name] is up quite a bit of fluid today. She denies shortness of breath. She reports bilateral left leg edema. She indulged over the Easter holiday and days afterwards. She has been drinking regular green tea with sugar.  ? ?Subjective:  ? ?1. Other congestive heart failure (Whitney [name]) ?Whitney [name] had an echo recently with cardiologist. She is retaining fluid today. She has not taken her lasix. She knows to call her cardiologist when retaining fluid. ? ?2. Vitamin D deficiency ?Whitney [name] is on Vitamin D currently. She denies nausea, vomiting, or muscle weakness.  ? ?Assessment/Plan:  ? ?1. Other congestive heart failure (Whitney [name]) ?Whitney [name] will continue lasix and she will decrease sodium and simple carbohydrates. Disease process and medications reviewed with an emphasis on salt restriction, regular exercise, and regular weight monitoring.   ? ?2. Vitamin D deficiency ?Low Vitamin D level contributes to fatigue and are associated with obesity, breast, and colon cancer. We will refill prescription Vitamin D 50,000 IU every week for 1 month with no refills and Whitney [name] will follow-up for routine testing of Vitamin D, at least 2-3 times per year to avoid over-replacement. ? ?- Vitamin D, Ergocalciferol, (Whitney [name]) 1.25 MG (50000 UNIT) CAPS capsule; Take 1 capsule (50,000 Units total) by mouth every 7 (seven) days.  Dispense: 4 capsule; Refill: 0 ? ?3. Obesity with current BMI of  54.45 ?Whitney [name] is currently in the action stage of change. As such, her goal is to continue with weight loss efforts. She has agreed to the Category 2 Plan.  ? ?Exercise goals: No exercise has been prescribed at this time. ? ?Behavioral modification strategies: increasing lean protein intake, decreasing simple carbohydrates, decreasing liquid calories, and meal planning and cooking strategies. ? ?Whitney [name] has agreed to follow-up with our clinic in 4 weeks. She was informed of the importance of frequent follow-up visits to maximize her success with intensive lifestyle modifications for her multiple health conditions.  ? ?Objective:  ? ?Blood pressure 96/72, pulse 65, temperature 98.2 ?F (36.8 ?C), height '5\' 1"'$  (1.549 m), weight 285 lb (129.3 kg), SpO2 99 %. ?Body mass index is 53.85 kg/m?. ? ?General: Cooperative, alert, well developed, in no acute distress. ?HEENT: Conjunctivae and lids unremarkable. ?Cardiovascular: Regular rhythm.  ?Lungs: Normal work of breathing. ?Neurologic: No focal deficits.  ? ?Lab Results  ?Component Value Date  ? CREATININE 0.81 12/22/2021  ? BUN 22 12/22/2021  ? NA 140 12/22/2021  ? K 4.0 12/22/2021  ? CL 100 12/22/2021  ? CO2 29 12/22/2021  ? ?Lab Results  ?Component Value Date  ? ALT 11 06/26/2019  ? AST 11 (A) 03/29/2021  ? ALKPHOS 91 06/26/2019  ? BILITOT 1.2 06/26/2019  ? ?Lab Results  ?Component Value Date  ? HGBA1C 6.4 (H) 09/06/2021  ? HGBA1C 9.4 03/29/2021  ? HGBA1C 7.5 (H) 06/26/2019  ? HGBA1C 7.6 (H) 07/12/2017  ? HGBA1C 7.6 (H) 11/16/2011  ? ?No results found for: INSULIN ?Lab  Results  ?Component Value Date  ? TSH 3.348 11/16/2011  ? ?Lab Results  ?Component Value Date  ? CHOL 184 03/29/2021  ? HDL 52 03/29/2021  ? [place] 109 03/29/2021  ? TRIG 127 03/29/2021  ? ?Lab Results  ?Component Value Date  ? VD25OH 39.6 09/06/2021  ? VD25OH 9.6 (L) 04/06/2021  ? ?Lab Results  ?Component Value Date  ? WBC 8.4 11/30/2021  ? HGB 11.8 (L) 11/30/2021  ? HCT 37.6 11/30/2021  ? MCV 86.6  11/30/2021  ? PLT 251 11/30/2021  ? ?Lab Results  ?Component Value Date  ? IRON 68 11/16/2011  ? TIBC 302 11/16/2011  ? FERRITIN 253 11/16/2011  ? ? ?Obesity Behavioral Intervention:  ? ?Approximately 15 minutes were spent on the discussion below. ? ?ASK: ?We discussed the diagnosis of obesity with [name] today and [name] agreed to give [place] permission to discuss obesity behavioral modification therapy today. ? ?ASSESS: ?[name] has the diagnosis of obesity and her BMI today is 53.9. [name] is in the action stage of change.  ? ?ADVISE: ?[name] was educated on the multiple health risks of obesity as well as the benefit of weight loss to improve her health. She was advised of the need for long term treatment and the importance of lifestyle modifications to improve her current health and to decrease her risk of future health problems. ? ?AGREE: ?Multiple dietary modification options and treatment options were discussed and [name] agreed to follow the recommendations documented in the above note. ? ?ARRANGE: ?[name] was educated on the importance of frequent visits to treat obesity as outlined per CMS and USPSTF guidelines and agreed to schedule her next follow up appointment today. ? ?Attestation Statements:  ? ?Reviewed by clinician on day of visit: allergies, medications, problem list, medical history, surgical history, family history, social history, and previous encounter notes. ? ?I, Tonye Pearson, am acting as Location manager for [employer], PA-C. ? ?I have reviewed the above documentation for accuracy and completeness, and I agree with the above. Abby Potash, PA-C ? ?

## 2022-01-16 ENCOUNTER — Encounter: Payer: Self-pay | Admitting: Cardiology

## 2022-01-16 ENCOUNTER — Ambulatory Visit (INDEPENDENT_AMBULATORY_CARE_PROVIDER_SITE_OTHER): Payer: HMO | Admitting: Cardiology

## 2022-01-16 VITALS — BP 142/70 | HR 70 | Ht 61.0 in | Wt 285.0 lb

## 2022-01-16 DIAGNOSIS — G4733 Obstructive sleep apnea (adult) (pediatric): Secondary | ICD-10-CM

## 2022-01-16 DIAGNOSIS — I5022 Chronic systolic (congestive) heart failure: Secondary | ICD-10-CM

## 2022-01-16 DIAGNOSIS — Z9989 Dependence on other enabling machines and devices: Secondary | ICD-10-CM | POA: Diagnosis not present

## 2022-01-16 DIAGNOSIS — R0609 Other forms of dyspnea: Secondary | ICD-10-CM | POA: Diagnosis not present

## 2022-01-16 DIAGNOSIS — R079 Chest pain, unspecified: Secondary | ICD-10-CM

## 2022-01-16 DIAGNOSIS — E785 Hyperlipidemia, unspecified: Secondary | ICD-10-CM

## 2022-01-16 DIAGNOSIS — I5020 Unspecified systolic (congestive) heart failure: Secondary | ICD-10-CM

## 2022-01-16 DIAGNOSIS — I10 Essential (primary) hypertension: Secondary | ICD-10-CM

## 2022-01-16 MED ORDER — FUROSEMIDE 20 MG PO TABS: 20.0000 mg | ORAL_TABLET | Freq: Every day | ORAL | 3 refills | Status: DC

## 2022-01-16 MED ORDER — AMLODIPINE BESYLATE 5 MG PO TABS: 5.0000 mg | ORAL_TABLET | Freq: Every day | ORAL | 3 refills | Status: DC

## 2022-01-16 MED ORDER — LISINOPRIL 40 MG PO TABS: 40.0000 mg | ORAL_TABLET | Freq: Every day | ORAL | 3 refills | Status: DC

## 2022-01-16 MED ORDER — SPIRONOLACTONE 25 MG PO TABS: 25.0000 mg | ORAL_TABLET | Freq: Every day | ORAL | 3 refills | Status: DC

## 2022-01-16 MED ORDER — ATORVASTATIN CALCIUM 40 MG PO TABS: 40.0000 mg | ORAL_TABLET | Freq: Every day | ORAL | 3 refills | Status: DC

## 2022-01-16 NOTE — Progress Notes (Signed)
?Whitney [name]:   ? ?Date:  01/16/2022  ? ?ID:  Whitney [name], DOB 01-01-1956, MRN [id number] ? ?PCP:  Whitney Amel, MD ?  ?[place]  ?Cardiologist:  Whitney Bergeron, MD  ?Advanced Practice Provider:  No care team member to display ?Electrophysiologist:  None  ? ? ?Referring MD: Whitney Amel, MD  ? ? ? ?History of Present Illness:   ? ?Whitney [name] is a 66 y.o. female with a hx of systolic heart failure with recovered EF from 30-35% to 50% on most recent TTE on 06/16/20, history of respiratory arrest in 2013 which resulted in a cardiac arrest, non-obstructive CAD on cath 2006, hypertension, hyperlipidemia, diabetes mellitus on insulin, obstructive sleep apnea on CPAP and morbid obesity who presents to clinic for follow-up. ? ?Patient was admitted to [facility] in September 2020 where she had presented with chest pain. During that admission, echo demonstrated LVEF 55-60% which was improved from prior imaging.  High-sensitivity troponin was negative x3, BNP was normal.  Given reassuring work-up, it was determined that her chest pain was likely noncardiac in etiology and the patient was discharged home. ? ?Last seen in clinic by Whitney [name] on 07/2021 where she was having left chest tightness after eating thought to be due to gas. She was otherwise working on weight loss.  ? ?Today, she is doing alright. She reports occasional chest pain which she associates to indigestion. She continues to work on diet and weight loss. She denies chest pressure, dyspnea at rest or with exertion, palpitations, PND, orthopnea, or syncope. ? ?Recently, her blood pressure has been elevated because of L knee pain. However, she is unable to undergo surgery until she loses weight. She was diagnosed with type 2 diabetes in her 22s. She had a yeast infection with Jardiance. She is open to starting a GLP1RA.  ? ?Past Medical History:  ?Diagnosis Date  ? Arthritis   ? left knee,right hip  ?  Back pain   ? Cardiac arrest [facility])   ? CHF (congestive heart failure) ([place])   ? Constipation   ? Depression   ? Diabetes mellitus   ? GERD (gastroesophageal reflux disease)   ? Glaucoma   ? Glaucoma   ? H/O cardiac arrest 11/2011  ? PEA  ? Heart murmur   ? Hyperlipidemia   ? Hypertension   ? Joint pain   ? Myocardial infarction [facility]) 2013  ? Palpitations   ? Sleep apnea   ? Bring machine,mask and tubing  ? Swelling   ? ? ?Past Surgical History:  ?Procedure Laterality Date  ? BACK SURGERY    ? COLONOSCOPY N/A 09/12/2016  ? Procedure: COLONOSCOPY;  Surgeon: Whitney Essex, MD;  Location: [facility];  Service: Endoscopy;  Laterality: N/A;  ? CYSTOSCOPY W/ URETERAL STENT PLACEMENT Left 01/04/2013  ? Procedure: CYSTOSCOPY WITH RETROGRADE PYELOGRAM/URETERAL STENT PLACEMENT;  Surgeon: Whitney Amass, MD;  Location: [facility] ORS;  Service: Urology;  Laterality: Left;  ? CYSTOSCOPY WITH RETROGRADE PYELOGRAM, URETEROSCOPY AND STENT PLACEMENT Left 01/26/2013  ? Procedure: CYSTOSCOPY, JJ STENT REMOVAL, LEFT URETEROSCOPY, ;  Surgeon: Whitney Amass, MD;  Location: [facility] ORS;  Service: Urology;  Laterality: Left;  CYSTO, JJ STENT REMOVAL, LEFT URETEROSCOPY ?  ? FOOT SURGERY    ? HERNIA REPAIR    ? HOLMIUM LASER APPLICATION Left 9/41/7408  ? Procedure: HOLMIUM LASER LITHOTRIPSY ;  Surgeon: Whitney Amass, MD;  Location: [facility];  Service: Urology;  Laterality: Left;  ?  KNEE SURGERY    ? left  ? PARTIAL HYSTERECTOMY    ? ? ?Current Medications: ?Current Meds  ?Medication Sig  ? aspirin EC 81 MG tablet Take 1 tablet (81 mg total) by mouth daily. Swallow whole.  ? BD INSULIN SYRINGE U/F 31G X 5/16" 0.5 ML MISC as directed.   ? blood glucose meter kit and supplies KIT Dispense based on patient and insurance preference. Use up to four times daily as directed. (FOR ICD-9 250.00, 250.01).  ? Chlorphen-Phenyleph-APAP (CORICIDIN D COLD/FLU/SINUS) 2-5-325 MG TABS Take 1 tablet by mouth every 6 (six) hours as needed (for congestion or cold-like symptoms).    ? clobetasol ointment (TEMOVATE) 6.31 % Apply 1 application topically 2 (two) times daily as needed (as directed- avoid open wounds).   ? gabapentin (NEURONTIN) 300 MG capsule Take 300 mg by mouth See admin instructions. Take 300 mg by mouth two times a day and an additional 300 mg once daily as needed for nerve pain  ? Lancets (ONETOUCH DELICA PLUS [id number]) MISC USE TO TEST YOUR BLOOD SUGAR 2 TIMES A DAY  ? latanoprost (XALATAN) 0.005 % ophthalmic solution 1 drop at bedtime.  ? loratadine (CLARITIN) 10 MG tablet Take 10 mg by mouth daily.  ? metoprolol succinate (TOPROL-XL) 100 MG 24 hr tablet Take 1 tablet (100 mg total) by mouth daily before breakfast. Take with or immediately following a meal.  ? NOVOLIN N 100 UNIT/ML injection Inject 35 Units into the skin 2 (two) times daily after a meal. 35 units in the morning and 25 units at night time.  ? olopatadine (PATANOL) 0.1 % ophthalmic solution Place 1 drop into both eyes 2 (two) times daily.  ? ONETOUCH ULTRA test strip 2 (two) times daily.  ? oxybutynin (DITROPAN) 5 MG tablet Take 1 tablet (5 mg total) by mouth daily.  ? pantoprazole (PROTONIX) 40 MG tablet Take 1 tablet (40 mg total) by mouth daily.  ? traMADol (ULTRAM) 50 MG tablet Take 2 tablets (100 mg total) by mouth every 8 (eight) hours as needed for severe pain.  ? Vitamin D, Ergocalciferol, (DRISDOL) 1.25 MG (50000 UNIT) CAPS capsule Take 1 capsule (50,000 Units total) by mouth every 7 (seven) days.  ? [DISCONTINUED] amLODipine (NORVASC) 5 MG tablet Take 1 tablet (5 mg total) by mouth daily.  ? [DISCONTINUED] atorvastatin (LIPITOR) 40 MG tablet Take 40 mg by mouth daily.  ? [DISCONTINUED] furosemide (LASIX) 20 MG tablet Take 1 tablet (20 mg total) by mouth daily.  ? [DISCONTINUED] lisinopril (ZESTRIL) 40 MG tablet Take 1 tablet (40 mg total) by mouth daily.  ?  ? ?Allergies:   Contrast media [iodinated contrast media], Empagliflozin, and Metformin  ? ?Social History  ? ?Socioeconomic History  ?  Marital status: Divorced  ?  Spouse name: Not on file  ? Number of children: Not on file  ? Years of education: Not on file  ? Highest education level: Not on file  ?Occupational History  ? Occupation: Retired  ?Tobacco Use  ? Smoking status: Never  ? Smokeless tobacco: Never  ?Vaping Use  ? Vaping Use: Never used  ?Substance and Sexual Activity  ? Alcohol use: No  ? Drug use: No  ? Sexual activity: Never  ?Other Topics Concern  ? Not on file  ?Social History Narrative  ? Not on file  ? ?Social Determinants of Health  ? ?Financial Resource Strain: Not on file  ?Food Insecurity: Not on file  ?Transportation Needs: Not on file  ?  Physical Activity: Not on file  ?Stress: Not on file  ?Social Connections: Not on file  ?  ? ?Family History: ?The patient's family history includes Anxiety disorder in her father; Asthma in her mother; Depression in her father; Diabetes in her father and mother; Heart disease in her father and mother; Hyperlipidemia in her father and mother; Hypertension in her father and mother; Kidney disease in her mother; Obesity in her mother. ? ?ROS:   ?Please see the history of present illness.    ?Review of Systems  ?Constitutional:  Negative for chills, fever and malaise/fatigue.  ?HENT:  Negative for hearing loss and sore throat.   ?Eyes:  Negative for blurred vision and redness.  ?Respiratory:  Negative for shortness of breath.   ?Cardiovascular:  Positive for chest pain and leg swelling. Negative for palpitations, orthopnea, claudication and PND.  ?Gastrointestinal:  Negative for melena, nausea and vomiting.  ?Genitourinary:  Negative for dysuria and flank pain.  ?Musculoskeletal:  Positive for joint pain (L knee). Negative for myalgias.  ?Neurological:  Negative for dizziness and loss of consciousness.  ?Endo/Heme/Allergies:  Negative for environmental allergies.  ?Psychiatric/Behavioral:  Negative for substance abuse.   ? ?EKGs/Labs/Other Studies Reviewed:   ? ?The following studies were  reviewed today: ?Myoview 07/2021: ?  The study is normal. The study is low risk. ?  No ST deviation was noted. ?  LV perfusion is normal. ?  Left ventricular function is normal. Nuclear stress EF: 61 %. The left

## 2022-01-16 NOTE — Patient Instructions (Signed)
Medication Instructions:   Your physician recommends that you continue on your current medications as directed. Please refer to the Current Medication list given to you today.  *If you need a refill on your cardiac medications before your next appointment, please call your pharmacy*   Follow-Up: At [facility], you and your health needs are our priority.  As part of our continuing mission to provide you with exceptional heart care, we have created designated Provider Care Teams.  These Care Teams include your primary Cardiologist (physician) and Advanced Practice Providers (APPs -  Physician Assistants and Nurse Practitioners) who all work together to provide you with the care you need, when you need it.  We recommend signing up for the patient portal called "MyChart".  Sign up information is provided on this After Visit Summary.  MyChart is used to connect with patients for Virtual Visits (Telemedicine).  Patients are able to view lab/test results, encounter notes, upcoming appointments, etc.  Non-urgent messages can be sent to your provider as well.   To learn more about what you can do with MyChart, go to [email or web address].    Your next appointment:   6 month(s)  The format for your next appointment:   In Person  Provider:   Heather E Pemberton, MD {   Important Information About Sugar       

## 2022-01-17 ENCOUNTER — Telehealth: Payer: Self-pay | Admitting: Pharmacist

## 2022-01-17 MED ORDER — OZEMPIC (0.25 OR 0.5 MG/DOSE) 2 MG/3ML ~~LOC~~ SOPN: PEN_INJECTOR | SUBCUTANEOUS | 0 refills | Status: DC

## 2022-01-17 NOTE — Telephone Encounter (Signed)
Received PA form, tried to submit and again received a message that there is no prior authorization needed and that Ozempic is available on patient's formulary. Called back pharmacy who again states PA is needed. Advised them they need to call pt's insurance to figure out why rx cannot be processed as everything is fine from our end. They stated the pt needs to call her insurance. Advised them that is not appropriate as a patient cannot get their own medication covered at a pharmacy. Again advised [facility] they need to contact pt's pharmacy to resolve issue processing Ozempic prescription. They did not state they would do so. Pt cannot start on med until pharmacy fixes this. Will call pharmacy tomorrow and hopefully speak with someone who is more helpful. ?

## 2022-01-17 NOTE — Telephone Encounter (Signed)
Pt referred by Dr Johney Frame to initiate VOH2SP therapy for weight loss. She has HTA insurance which does not cover weight loss medications [place] Sh[place], [place]). They do cover Ozempic in patients with DM. Pt does have DM with A1c last year of 9.4%, improved most recently to 6.4%, currently just on insulin. She previously took Trulicity but stopped due to cost. Her HTA plan this year is a DM and heartcare plan that covers Ozempic as a tier 6 medication which looks like it should be free. Has a prior allergy to metformin - abdominal pain. ? ?Prior auth not needed, sent rx to pharmacy, pharmacy stated prior auth was needed. They will fax over form. Will want to confirm cost before reaching out to pt. ?

## 2022-01-19 NOTE — Telephone Encounter (Signed)
Called pharmacy again yesterday who stated they would contact pt's insurance. Called back today and no one had. They stated we needed to call insurance. Advised them again that there is no prior auth needed so there is nothing else the MD office can do, and that the issue is on the processing end and that they need to call pt's insurance to sort out whatever their processing issue [place]. Lanelle Bal stated she would call pt's insurance today. ?

## 2022-01-23 NOTE — Telephone Encounter (Signed)
Have called pharmacy at least 6x now. They have not provided any helpful information and are still saying they cannot run Ozempic rx even though everything coverage-wise is fine on our end. ? ?Spoke with pt, she doesn't want to go in the donut hole. Does have extra help through Medicare. States [facility] called her yesterday about starting Ozempic too. She plans to follow up with them. Advised her to let us know if she wishes for [place] to send a prescription in to a different pharmacy. ?

## 2022-01-25 DIAGNOSIS — E7800 Pure hypercholesterolemia, unspecified: Secondary | ICD-10-CM | POA: Diagnosis not present

## 2022-01-25 DIAGNOSIS — K219 Gastro-esophageal reflux disease without esophagitis: Secondary | ICD-10-CM | POA: Diagnosis not present

## 2022-01-25 DIAGNOSIS — I10 Essential (primary) hypertension: Secondary | ICD-10-CM | POA: Diagnosis not present

## 2022-01-25 DIAGNOSIS — E1169 Type 2 diabetes mellitus with other specified complication: Secondary | ICD-10-CM | POA: Diagnosis not present

## 2022-01-25 DIAGNOSIS — I5022 Chronic systolic (congestive) heart failure: Secondary | ICD-10-CM | POA: Diagnosis not present

## 2022-02-06 DIAGNOSIS — E1169 Type 2 diabetes mellitus with other specified complication: Secondary | ICD-10-CM | POA: Diagnosis not present

## 2022-02-06 DIAGNOSIS — L309 Dermatitis, unspecified: Secondary | ICD-10-CM | POA: Diagnosis not present

## 2022-02-06 DIAGNOSIS — E1165 Type 2 diabetes mellitus with hyperglycemia: Secondary | ICD-10-CM | POA: Diagnosis not present

## 2022-02-06 DIAGNOSIS — G4733 Obstructive sleep apnea (adult) (pediatric): Secondary | ICD-10-CM | POA: Diagnosis not present

## 2022-02-07 ENCOUNTER — Ambulatory Visit (INDEPENDENT_AMBULATORY_CARE_PROVIDER_SITE_OTHER): Payer: HMO | Admitting: Physician Assistant

## 2022-02-14 ENCOUNTER — Telehealth: Payer: Self-pay

## 2022-02-14 DIAGNOSIS — I5020 Unspecified systolic (congestive) heart failure: Secondary | ICD-10-CM

## 2022-02-14 DIAGNOSIS — Z79899 Other long term (current) drug therapy: Secondary | ICD-10-CM

## 2022-02-14 DIAGNOSIS — I5022 Chronic systolic (congestive) heart failure: Secondary | ICD-10-CM

## 2022-02-14 MED ORDER — FUROSEMIDE 40 MG PO TABS: 40.0000 mg | ORAL_TABLET | Freq: Every day | ORAL | 2 refills | Status: DC

## 2022-02-14 NOTE — Telephone Encounter (Signed)
As copied below from pts last OV note with Dr. Johney Frame on 4/18, she was not instructed to increase her lasix to 40 mg po daily.  She is prescribed to take lasix 20 mg po daily, as indicated below. ? ?Left the pt a message to call the office back for further assistance with this.  ? ?  ?#Chronic systolic heart failure with recovered EF  ?EF originally 30-35% in the setting of a respiratory/cardiac arrest that improved to 50% on most recent TTE.  ?-Continue metoprolol '100mg'$  XL ?-Continue lisinopril '40mg'$  daily ?-Continue spironolactone '25mg'$  daily ?-Continue lasix '20mg'$  daily ?-Continue ASA to '81mg'$  daily ?-Did not tolerate jardiance due to yeast infections and GLP-1 too expensive ?  ?

## 2022-02-14 NOTE — Telephone Encounter (Signed)
Trude, Cansler A - 02/14/2022 10:17 AM ?[name], MD  ?[facility]  2:55 PM  ?To: Nuala Alpha, LPN  ? ? ?  ?   ? ?Message ? ?If the lasix '40mg'$  daily is keeping her dry, I am fine with that. We can renew her script for '40mg'$  daily and have her come in for BMET next week to ensure this dose is not too much for her kidneys.  ? ? ?Spoke with the pt.  She would like to continue taking the dose of lasix 40 mg po daily.  Pt aware if we send in lasix 40 mg po daily, she will need to come in for a BMET next week, to ensure this dose is not too much for her kidneys.  ?Confirmed the pharmacy of choice with the pt.  Pt will come in for repeat BMET on next Monday 5/22.  ?Pt aware that I will send in the 40 mg tablets of lasix, and she is only to take this once daily.  ?Pt verbalized understanding and agrees with this plan. ?

## 2022-02-14 NOTE — Telephone Encounter (Signed)
Pt calling stating at [place] 01/16/22 Dr. Johney Frame told her that she could take furosemide 20 mg, taking 2 tablets (40 mg total) daily, but this change is not on pt's medication list. Pt would like a call back because pt is running out of medication and pharmacy will not refill because it is too soon. Please addres ?

## 2022-02-14 NOTE — Telephone Encounter (Signed)
Spoke with the pt.  Pt states she went to the hospital back in late March for weight gain, LEE, and sob.  ?She states when she was discharged at that time, the ER MD advised her to increase her lasix to 40 mg po daily and follow-up with Dr. Johney Frame.  ? ?Pt saw Dr. Johney Frame for this on 4/18.  Dr. Johney Frame advised the pt to take lasix 20 mg po daily and continue taking her spironolactone 25 mg po daily.  Pt misunderstood those instructions and has continued taking lasix 40 mg po daily, which has now caused her to run out of her 20 mg tablets of lasix to soon.  She confirmed she is taking her spironolactone 25 mg po daily as prescribed. ? ?Informed the pt that when she went to the ER, the ER MD advised her to temporarily increase her lasix to 40 mg po daily for 3 days only, then go back down to 20 mg po daily thereafter, and see Dr. Johney Frame for follow-up.  Pt misunderstood those instructions and has continued doubling up on her lasix daily since then.  She will soon run out of her pills and will be too early to refill. ? ?Pt states she has no more sob or lower extremity edema.  She reports her weights are back at baseline.  ? ?Pt is asking Dr. Johney Frame if she should be called in a script for her to proceed with taking increased lasix 40 mg po daily, or should she go back down to taking 20 mg po daily, as she is suppose to be taking.  ? ?Pt aware I will route this message to Dr. Johney Frame to further review and advise on lasix regimen, and I will follow-up with her accordingly thereafter.   ? ?Pt verbalized understanding and agrees with this plan. Pt sends apologies for misunderstanding and taking her lasix regimen incorrectly. ?

## 2022-02-14 NOTE — Telephone Encounter (Signed)
Pt calling back requesting to speak to Dr Jacolyn Reedy nurse, Winifred Olive, LPN. Please address ?

## 2022-02-15 ENCOUNTER — Ambulatory Visit (INDEPENDENT_AMBULATORY_CARE_PROVIDER_SITE_OTHER): Payer: HMO | Admitting: Adult Health

## 2022-02-15 ENCOUNTER — Encounter (INDEPENDENT_AMBULATORY_CARE_PROVIDER_SITE_OTHER): Payer: Self-pay | Admitting: Adult Health

## 2022-02-15 VITALS — BP 133/72 | HR 72 | Temp 98.3°F | Ht 61.0 in | Wt 277.0 lb

## 2022-02-15 DIAGNOSIS — Z6841 Body Mass Index (BMI) 40.0 and over, adult: Secondary | ICD-10-CM

## 2022-02-15 DIAGNOSIS — E669 Obesity, unspecified: Secondary | ICD-10-CM | POA: Diagnosis not present

## 2022-02-15 DIAGNOSIS — E559 Vitamin D deficiency, unspecified: Secondary | ICD-10-CM | POA: Diagnosis not present

## 2022-02-15 DIAGNOSIS — Z794 Long term (current) use of insulin: Secondary | ICD-10-CM | POA: Diagnosis not present

## 2022-02-15 DIAGNOSIS — E1169 Type 2 diabetes mellitus with other specified complication: Secondary | ICD-10-CM

## 2022-02-16 ENCOUNTER — Encounter (INDEPENDENT_AMBULATORY_CARE_PROVIDER_SITE_OTHER): Payer: Self-pay | Admitting: Adult Health

## 2022-02-16 ENCOUNTER — Other Ambulatory Visit (INDEPENDENT_AMBULATORY_CARE_PROVIDER_SITE_OTHER): Payer: Self-pay | Admitting: Adult Health

## 2022-02-16 DIAGNOSIS — E559 Vitamin D deficiency, unspecified: Secondary | ICD-10-CM

## 2022-02-16 LAB — VITAMIN D 25 HYDROXY (VIT D DEFICIENCY, FRACTURES): Vit D, 25-Hydroxy: 49.4 ng/mL (ref 30.0–100.0)

## 2022-02-16 MED ORDER — VITAMIN D (ERGOCALCIFEROL) 1.25 MG (50000 UNIT) PO CAPS: 50000.0000 [IU] | ORAL_CAPSULE | ORAL | 0 refills | Status: DC

## 2022-02-19 ENCOUNTER — Other Ambulatory Visit (INDEPENDENT_AMBULATORY_CARE_PROVIDER_SITE_OTHER): Payer: Self-pay | Admitting: Physician Assistant

## 2022-02-19 ENCOUNTER — Ambulatory Visit
Admission: RE | Admit: 2022-02-19 | Discharge: 2022-02-19 | Disposition: A | Discharge status: Home / Self Care | Payer: HMO | Source: Ambulatory Visit | Attending: Family Medicine | Admitting: Family Medicine

## 2022-02-19 ENCOUNTER — Other Ambulatory Visit: Payer: HMO

## 2022-02-19 DIAGNOSIS — Z780 Asymptomatic menopausal state: Secondary | ICD-10-CM | POA: Diagnosis not present

## 2022-02-19 DIAGNOSIS — E559 Vitamin D deficiency, unspecified: Secondary | ICD-10-CM

## 2022-02-19 DIAGNOSIS — M85852 Other specified disorders of bone density and structure, left thigh: Secondary | ICD-10-CM | POA: Diagnosis not present

## 2022-02-19 DIAGNOSIS — E2839 Other primary ovarian failure: Secondary | ICD-10-CM

## 2022-02-22 NOTE — Progress Notes (Addendum)
Chief Complaint:   OBESITY Whitney [name] is here to discuss her progress with her obesity treatment plan along with follow-up of her obesity related diagnoses. Whitney [name] is on the Category 2 Plan and states she is following her eating plan approximately 50% of the time. Whitney [name] states she is exercising 0 minutes 0 times per week.  Today's visit was #: 15 Starting weight: 313 lbs Starting date: 04/06/2021 Today's weight: 277 lbs Today's date: 02/15/2022 Total lbs lost to date: 36 Total lbs lost since last in-office visit: 8  Interim History:  Cardiology/Whitney [name] recommended starting Whitney [name] on GLP-1 therapy due to T2D, as Jardiance caused yeast infection.  Whitney [name] saw Whitney [name] on 02/06/22 whom recommended her not to start GLP-1 therapy due to potential GI upset and that Whitney [name]'s mother had pancreatis.  Subjective:   1. Type 2 diabetes mellitus with other specified complication, with long-term current use of insulin (Whitney [name]) On 02/06/22 Whitney [name] had Whitney [name] office visit/Whitney [name].  Her A1c at 7.1.  Whitney [name] adjusted Novolin from 35U am and 25U pm TO: 35U am and 15U pm (reduced evening dose). She experienced two episodes of hypoglycemia approximately 2 weeks ago-low 90's and sx's of ligtheadedness.  2. Vitamin D deficiency Whitney [name] is currently taking ergocalciferol.  Denies any nausea, vomiting or muscle weakness.  On 09/06/21 her Vit D level was at 39.6.  Assessment/Plan:   1. Type 2 diabetes mellitus with other specified complication, with long-term current use of insulin (Whitney [name]) Whitney [name] will follow up with Whitney [name] about diabetic management.  2. Vitamin D deficiency We will check labs today and refill ergocalciferol as appropriate.   - VITAMIN D 25 Hydroxy (Vit-D Deficiency, Fractures)  3. Obesity with current BMI of 52.5 Whitney [name] is currently in the action stage of change. As such, her goal is to continue with weight loss efforts. She has agreed to the Category 2 Plan.   Exercise goals: No exercise has been  prescribed at this time.  Behavioral modification strategies: increasing lean protein intake, decreasing simple carbohydrates, meal planning and cooking strategies, keeping healthy foods in the home, and planning for success.  Whitney [name] has agreed to follow-up with our clinic in 4 weeks. She was informed of the importance of frequent follow-up visits to maximize her success with intensive lifestyle modifications for her multiple health conditions.   Whitney [name] was informed we would discuss her lab results at her next visit unless there is a critical issue that needs to be addressed sooner. Whitney [name] agreed to keep her next visit at the agreed upon time to discuss these results.  Objective:   Blood pressure 133/72, pulse 72, temperature 98.3 F (36.8 C), height '5\' 1"'$  (1.549 m), weight 277 lb (125.6 kg), SpO2 96 %. Body mass index is 52.34 kg/m.  General: Cooperative, alert, well developed, in no acute distress. HEENT: Conjunctivae and lids unremarkable. Cardiovascular: Regular rhythm.  Lungs: Normal work of breathing. Neurologic: No focal deficits.   Lab Results  Component Value Date   CREATININE 0.81 12/22/2021   BUN 22 12/22/2021   NA 140 12/22/2021   K 4.0 12/22/2021   CL 100 12/22/2021   CO2 29 12/22/2021   Lab Results  Component Value Date   ALT 11 06/26/2019   AST 11 (A) 03/29/2021   ALKPHOS 91 06/26/2019   BILITOT 1.2 06/26/2019   Lab Results  Component Value Date   HGBA1C 6.4 (H) 09/06/2021   HGBA1C 9.4 03/29/2021   HGBA1C 7.5 (H) 06/26/2019   HGBA1C 7.6 (H) 07/12/2017  HGBA1C 7.6 (H) 11/16/2011   No results found for: INSULIN Lab Results  Component Value Date   TSH 3.348 11/16/2011   Lab Results  Component Value Date   CHOL 184 03/29/2021   HDL 52 03/29/2021   LDLCALC 109 03/29/2021   TRIG 127 03/29/2021   Lab Results  Component Value Date   VD25OH 49.4 02/15/2022   VD25OH 39.6 09/06/2021   VD25OH 9.6 (L) 04/06/2021   Lab Results  Component Value Date    WBC 8.4 11/30/2021   HGB 11.8 (L) 11/30/2021   HCT 37.6 11/30/2021   MCV 86.6 11/30/2021   PLT 251 11/30/2021   Lab Results  Component Value Date   IRON 68 11/16/2011   TIBC 302 11/16/2011   FERRITIN 253 11/16/2011    Obesity Behavioral Intervention:   Approximately 15 minutes were spent on the discussion below.  ASK: We discussed the diagnosis of obesity with Whitney [name] today and Whitney [name] agreed to give [place] permission to discuss obesity behavioral modification therapy today.  ASSESS: Whitney [name] has the diagnosis of obesity and her BMI today is 52.5. Whitney [name] is in the action stage of change.   ADVISE: Whitney [name] was educated on the multiple health risks of obesity as well as the benefit of weight loss to improve her health. She was advised of the need for long term treatment and the importance of lifestyle modifications to improve her current health and to decrease her risk of future health problems.  AGREE: Multiple dietary modification options and treatment options were discussed and Whitney [name] agreed to follow the recommendations documented in the above note.  ARRANGE: Whitney [name] was educated on the importance of frequent visits to treat obesity as outlined per CMS and USPSTF guidelines and agreed to schedule her next follow up appointment today.  Attestation Statements:   Reviewed by clinician on day of visit: allergies, medications, problem list, medical history, surgical history, family history, social history, and previous encounter notes.  I, Whitney [name], RMA, am acting as transcriptionist for Whitney Marble, NP.  I have reviewed the above documentation for accuracy and completeness, and I agree with the above. -  Whitney [name] d. Carlia Bomkamp, NP-C

## 2022-03-01 DIAGNOSIS — E7800 Pure hypercholesterolemia, unspecified: Secondary | ICD-10-CM | POA: Diagnosis not present

## 2022-03-02 ENCOUNTER — Ambulatory Visit (INDEPENDENT_AMBULATORY_CARE_PROVIDER_SITE_OTHER): Payer: HMO | Admitting: Podiatry

## 2022-03-02 ENCOUNTER — Encounter: Payer: Self-pay | Admitting: Podiatry

## 2022-03-02 DIAGNOSIS — J301 Allergic rhinitis due to pollen: Secondary | ICD-10-CM | POA: Insufficient documentation

## 2022-03-02 DIAGNOSIS — M216X2 Other acquired deformities of left foot: Secondary | ICD-10-CM

## 2022-03-02 DIAGNOSIS — L84 Corns and callosities: Secondary | ICD-10-CM | POA: Diagnosis not present

## 2022-03-02 DIAGNOSIS — M79675 Pain in left toe(s): Secondary | ICD-10-CM | POA: Diagnosis not present

## 2022-03-02 DIAGNOSIS — M216X1 Other acquired deformities of right foot: Secondary | ICD-10-CM | POA: Diagnosis not present

## 2022-03-02 DIAGNOSIS — M79674 Pain in right toe(s): Secondary | ICD-10-CM | POA: Diagnosis not present

## 2022-03-02 DIAGNOSIS — Z794 Long term (current) use of insulin: Secondary | ICD-10-CM | POA: Insufficient documentation

## 2022-03-02 DIAGNOSIS — B351 Tinea unguium: Secondary | ICD-10-CM

## 2022-03-02 DIAGNOSIS — E1165 Type 2 diabetes mellitus with hyperglycemia: Secondary | ICD-10-CM

## 2022-03-02 DIAGNOSIS — I2510 Atherosclerotic heart disease of native coronary artery without angina pectoris: Secondary | ICD-10-CM | POA: Insufficient documentation

## 2022-03-02 DIAGNOSIS — I8310 Varicose veins of unspecified lower extremity with inflammation: Secondary | ICD-10-CM | POA: Insufficient documentation

## 2022-03-02 DIAGNOSIS — G629 Polyneuropathy, unspecified: Secondary | ICD-10-CM | POA: Insufficient documentation

## 2022-03-02 DIAGNOSIS — E2839 Other primary ovarian failure: Secondary | ICD-10-CM | POA: Insufficient documentation

## 2022-03-02 DIAGNOSIS — I872 Venous insufficiency (chronic) (peripheral): Secondary | ICD-10-CM | POA: Insufficient documentation

## 2022-03-02 NOTE — Progress Notes (Signed)
This patient returns to my office for at risk foot care.  This patient requires this care by a professional since this patient will be at risk due to having diabetes.  Patient has painful corn fifth toe right foot and painful callus on the outside of both feet  B/L.  This patient is unable to cut nails herself since the patient cannot reach her nails.These nails are painful walking and wearing shoes.  This patient presents for at risk foot care today.  General Appearance  Alert, conversant and in no acute stress.  Vascular  Defeered due to wearing compression socks.  Patient has not been seen for 9 months.  Neurologic  Senn-Weinstein monofilament wire test diminished  bilaterally. Muscle power within normal limits bilaterally.  Nails Thick disfigured discolored nails with subungual debris  from hallux to fifth toes bilaterally. No evidence of bacterial infection or drainage bilaterally.  Orthopedic  No limitations of motion  feet .  No crepitus or effusions noted.  No bony pathology or digital deformities noted.  Adducto varus fifth digit  Right foot.  Plantar flexed fifth metatarsal  B/L.  Skin  normotropic skin with no porokeratosis noted bilaterally.  No signs of infections or ulcers noted. Porokeratosis sub 5th  B/L.  Corn 5th digit right foot.    Onychomycosis  Pain in right toes  Pain in left toes  Porokeratosis B/L  Corn fifth toe right foot.  Consent was obtained for treatment procedures.   Mechanical debridement of nails 1-5  bilaterally performed with a nail nipper.  Filed with dremel without incident. Debridement of callus  Both feet with # 15 blade and dremel tool. Prescribed diabetic shoes for this patient.   Return office visit   3 months                   Told patient to return for periodic foot care and evaluation due to potential at risk complications.   Gardiner Barefoot DPM

## 2022-03-05 DIAGNOSIS — E7800 Pure hypercholesterolemia, unspecified: Secondary | ICD-10-CM | POA: Diagnosis not present

## 2022-03-05 DIAGNOSIS — E1169 Type 2 diabetes mellitus with other specified complication: Secondary | ICD-10-CM | POA: Diagnosis not present

## 2022-03-05 DIAGNOSIS — K219 Gastro-esophageal reflux disease without esophagitis: Secondary | ICD-10-CM | POA: Diagnosis not present

## 2022-03-05 DIAGNOSIS — I5022 Chronic systolic (congestive) heart failure: Secondary | ICD-10-CM | POA: Diagnosis not present

## 2022-03-05 DIAGNOSIS — I10 Essential (primary) hypertension: Secondary | ICD-10-CM | POA: Diagnosis not present

## 2022-03-06 ENCOUNTER — Other Ambulatory Visit: Payer: HMO

## 2022-03-07 ENCOUNTER — Ambulatory Visit: Payer: HMO

## 2022-03-07 DIAGNOSIS — E1165 Type 2 diabetes mellitus with hyperglycemia: Secondary | ICD-10-CM

## 2022-03-07 DIAGNOSIS — M216X2 Other acquired deformities of left foot: Secondary | ICD-10-CM

## 2022-03-07 DIAGNOSIS — M216X1 Other acquired deformities of right foot: Secondary | ICD-10-CM

## 2022-03-07 NOTE — Progress Notes (Signed)
SITUATION Reason for Consult: Evaluation for Prefabricated Diabetic Shoes and Custom Diabetic Inserts. Patient / Caregiver Report: Patient would like well fitting shoes  OBJECTIVE DATA: Patient History / Diagnosis:    ICD-10-CM   1. Uncontrolled type 2 diabetes mellitus with hyperglycemia (HCC)  E11.65     2. Plantar flexed metatarsal bone of left foot  M21.6X2     3. Plantar flexed metatarsal bone of right foot  M21.6X1       Physician Treating Diabetes:  Dibas Koirala, MD  Current or Previous Devices:   Current user  In-Person Foot Examination: Ulcers & Callousing:   None and no history Deformities:    Plantarflexed met bone bilateral Sensation:    Compromised  Shoe Size:     8.5W  ORTHOTIC RECOMMENDATION Recommended Devices: - 1x pair prefabricated PDAC approved diabetic shoes; Patient Selected Orthofeet 835 Size 8.5W - 3x pair custom-to-patient PDAC approved vacuum formed diabetic insoles.  GOALS OF SHOES AND INSOLES - Reduce shear and pressure - Reduce / Prevent callus formation - Reduce / Prevent ulceration - Protect the fragile healing compromised diabetic foot.  Patient would benefit from diabetic shoes and inserts as patient has diabetes mellitus and the patient has one or more of the following conditions: - History of partial or complete amputation of the foot - History of previous foot ulceration. - History of pre-ulcerative callus - Peripheral neuropathy with evidence of callus formation - Foot deformity - Poor circulation  ACTIONS PERFORMED Potential out of pocket cost was communicated to patient. Patient understood and consented to measurement and casting. Patient was casted for insoles via crush box and measured for shoes via brannock device. Procedure was explained and patient tolerated procedure well. All questions were answered and concerns addressed. Casts were shipped to central fabrication for HOLD until Certificate of Medical Necessity or otherwise  necessary authorization from insurance is obtained.  PLAN Shoes are to be ordered and casts released from hold once all appropriate paperwork is complete. Patient is to be contacted and scheduled for fitting once shoes and insoles have been fabricated and received.

## 2022-03-09 ENCOUNTER — Telehealth: Payer: Self-pay

## 2022-03-09 DIAGNOSIS — G4733 Obstructive sleep apnea (adult) (pediatric): Secondary | ICD-10-CM | POA: Diagnosis not present

## 2022-03-09 NOTE — Telephone Encounter (Signed)
Error, disregard.

## 2022-03-14 ENCOUNTER — Other Ambulatory Visit (INDEPENDENT_AMBULATORY_CARE_PROVIDER_SITE_OTHER): Payer: Self-pay | Admitting: Adult Health

## 2022-03-14 DIAGNOSIS — E559 Vitamin D deficiency, unspecified: Secondary | ICD-10-CM

## 2022-03-15 ENCOUNTER — Ambulatory Visit (INDEPENDENT_AMBULATORY_CARE_PROVIDER_SITE_OTHER): Payer: HMO | Admitting: Adult Health

## 2022-03-22 ENCOUNTER — Ambulatory Visit (INDEPENDENT_AMBULATORY_CARE_PROVIDER_SITE_OTHER): Payer: HMO | Admitting: Adult Health

## 2022-03-31 ENCOUNTER — Other Ambulatory Visit (INDEPENDENT_AMBULATORY_CARE_PROVIDER_SITE_OTHER): Payer: Self-pay | Admitting: Adult Health

## 2022-03-31 DIAGNOSIS — E559 Vitamin D deficiency, unspecified: Secondary | ICD-10-CM

## 2022-04-03 ENCOUNTER — Ambulatory Visit (HOSPITAL_COMMUNITY)
Admission: EM | Admit: 2022-04-03 | Discharge: 2022-04-03 | Disposition: A | Discharge status: Home / Self Care | Payer: HMO | Attending: Physician Assistant | Admitting: Physician Assistant

## 2022-04-03 ENCOUNTER — Encounter (HOSPITAL_COMMUNITY): Payer: Self-pay | Admitting: Emergency Medicine

## 2022-04-03 DIAGNOSIS — M79662 Pain in left lower leg: Secondary | ICD-10-CM | POA: Diagnosis not present

## 2022-04-03 DIAGNOSIS — L03115 Cellulitis of right lower limb: Secondary | ICD-10-CM

## 2022-04-03 DIAGNOSIS — M79661 Pain in right lower leg: Secondary | ICD-10-CM

## 2022-04-03 DIAGNOSIS — I872 Venous insufficiency (chronic) (peripheral): Secondary | ICD-10-CM | POA: Diagnosis not present

## 2022-04-03 MED ORDER — CEPHALEXIN 500 MG PO CAPS: 500.0000 mg | ORAL_CAPSULE | Freq: Three times a day (TID) | ORAL | 0 refills | Status: DC

## 2022-04-03 NOTE — Discharge Instructions (Addendum)
I have made a referral to the wound center at [facility] here in [place], they should be calling you to arrange an appointment within the next 24 to 48 hours. Advised to take the Keflex 500 mg 1 every 8 hours until completed to treat infection. Advised to take the tramadol that you have at home in order to help control the pain.

## 2022-04-03 NOTE — ED Provider Notes (Signed)
[facility]    CSN: [id number] Arrival date & time: 04/03/22  1236      History   Chief Complaint No chief complaint on file.   HPI Whitney [name] is a 66 y.o. female.   66 year old female presents with severe stasis dermatitis bilateral lower legs.  Patient relates that she has a history of stasis dermatitis and that has been treated in the past with different types of compression measures.  Patient relates that she had an Haematologist on both lower legs for about 5 to 6 days, she removed these 3 days ago in order to attend a funeral.  Patient relates since she has had increasing pain, burning sensation, itching, and tenderness of both legs with the right being worse than the left.  Patient indicates that the right lower leg is swollen, and has been weeping for the past 1 to 2 days.  Patient indicates that both legs are very tender to the touch specifically the right leg.  Patient indicates she is not having any fever or chills.  Patient does indicate that the right leg has become increasingly red along with the swelling and hurts tremendously when she touches it.  Patient has been treated at a wound center in the past however she relates that it is a long distance for her to be able to get there and its been a long time since her last visit.     Past Medical History:  Diagnosis Date   Arthritis    left knee,right hip   Back pain    Cardiac arrest (HCC)    CHF (congestive heart failure) ([place])    Constipation    Depression    Diabetes mellitus    GERD (gastroesophageal reflux disease)    Glaucoma    Glaucoma    H/O cardiac arrest 11/2011   PEA   Heart murmur    Hyperlipidemia    Hypertension    Joint pain    Myocardial infarction [facility]) 2013   Palpitations    Sleep apnea    Bring machine,mask and tubing   Swelling     Patient Active Problem List   Diagnosis Date Noted   Allergic rhinitis due to pollen 03/02/2022   Atherosclerotic heart disease of native  coronary artery without angina pectoris 03/02/2022   Chronic venous stasis dermatitis 03/02/2022   Decreased estrogen level 03/02/2022   Long term (current) use of insulin ([place]) 03/02/2022   Neuropathy 03/02/2022   Peripheral venous insufficiency 03/02/2022   Varicose veins of lower extremity with inflammation 03/02/2022   Plantar flexed metatarsal bone of left foot 06/26/2021   Plantar flexed metatarsal bone of right foot 06/26/2021   Diabetes mellitus ([place]) 06/20/2021   Vitamin D deficiency 05/15/2021   Detrusor muscle hypertonia 11/04/2019   Disorder of mitral valve 11/04/2019   Hypertension associated with type 2 diabetes mellitus ([place]) 11/04/2019   Gastro-esophageal reflux disease without esophagitis 11/04/2019   Hay fever 11/04/2019   Pure hypercholesterolemia 11/04/2019   Type 2 diabetes mellitus with hyperglycemia ([place]) 11/04/2019   Hypokalemia 06/25/2019   History of cardiomyopathy 06/25/2019   Pain due to onychomycosis of toenails of both feet 03/18/2019   Porokeratosis 03/18/2019   Acute right lumbar radiculopathy 07/25/2017   At risk for adverse drug event 07/16/2017   Humerus fracture 07/12/2017   Morbid obesity due to excess calories ([place]) 25/63/8937   Chronic systolic heart failure ([place]) 12/14/2015   Chest pain 12/14/2015   Angina decubitus ([place]) 12/14/2015  Adiposity 01/06/2013   Arthritis of knee, degenerative 01/06/2013   Osteoarthritis of both knees 01/06/2013   OSA (obstructive sleep apnea) 11/30/2011   Cardiac arrest ([place]) 11/13/2011   Diabetes mellitus type 2, uncontrolled 11/13/2011    Past Surgical History:  Procedure Laterality Date   BACK SURGERY     COLONOSCOPY N/A 09/12/2016   Procedure: COLONOSCOPY;  Surgeon: Clarene Essex, MD;  Location: [facility];  Service: Endoscopy;  Laterality: N/A;   CYSTOSCOPY W/ URETERAL STENT PLACEMENT Left 01/04/2013   Procedure: CYSTOSCOPY WITH RETROGRADE PYELOGRAM/URETERAL STENT PLACEMENT;  Surgeon: Bernestine Amass,  MD;  Location: [facility];  Service: Urology;  Laterality: Left;   CYSTOSCOPY WITH RETROGRADE PYELOGRAM, URETEROSCOPY AND STENT PLACEMENT Left 01/26/2013   Procedure: CYSTOSCOPY, JJ STENT REMOVAL, LEFT URETEROSCOPY, ;  Surgeon: Bernestine Amass, MD;  Location: [facility];  Service: Urology;  Laterality: Left;  CYSTO, JJ STENT REMOVAL, LEFT URETEROSCOPY    FOOT SURGERY     HERNIA REPAIR     HOLMIUM LASER APPLICATION Left 4/97/0263   Procedure: HOLMIUM LASER LITHOTRIPSY ;  Surgeon: Bernestine Amass, MD;  Location: [facility];  Service: Urology;  Laterality: Left;   KNEE SURGERY     left   PARTIAL HYSTERECTOMY      OB History     Gravida  0   Para  0   Term  0   Preterm  0   AB  0   Living  0      SAB  0   IAB  0   Ectopic  0   Multiple  0   Live Births  0            Home Medications    Prior to Admission medications   Medication Sig Start Date End Date Taking? Authorizing Provider  cephALEXin (KEFLEX) 500 MG capsule Take 1 capsule (500 mg total) by mouth 3 (three) times daily. 04/03/22  Yes Nyoka Lint, PA-C  amLODipine (NORVASC) 5 MG tablet Take 1 tablet (5 mg total) by mouth daily. 01/16/22   [name], MD  aspirin EC 81 MG tablet Take 1 tablet (81 mg total) by mouth daily. Swallow whole. 06/23/20   [name], MD  atorvastatin (LIPITOR) 40 MG tablet Take 1 tablet (40 mg total) by mouth daily. 01/16/22   [name], MD  BD INSULIN SYRINGE U/F 31G X 5/16" 0.5 ML MISC as directed.  03/02/19   [provider]  blood glucose meter kit and supplies KIT Dispense based on patient and insurance preference. Use up to four times daily as directed. (FOR ICD-9 250.00, 250.01). 09/02/17   Medina-Vargas, Monina C, NP  Chlorphen-Phenyleph-APAP (CORICIDIN D COLD/FLU/SINUS) 2-5-325 MG TABS Take 1 tablet by mouth every 6 (six) hours as needed (for congestion or cold-like symptoms).     [provider]  clobetasol ointment (TEMOVATE) 7.85 % Apply 1  application topically 2 (two) times daily as needed (as directed- avoid open wounds).  07/08/18   [provider]  furosemide (LASIX) 40 MG tablet Take 1 tablet (40 mg total) by mouth daily. 02/14/22   [name], MD  gabapentin (NEURONTIN) 300 MG capsule Take 300 mg by mouth See admin instructions. Take 300 mg by mouth two times a day and an additional 300 mg once daily as needed for nerve pain 03/11/19   [provider]  Lancets (ONETOUCH DELICA PLUS [id number]) MISC USE TO TEST YOUR BLOOD SUGAR 2 TIMES A DAY 11/23/19  [provider]  latanoprost (XALATAN) 0.005 % ophthalmic solution 1 drop at bedtime. 09/08/19   [provider]  lisinopril (ZESTRIL) 40 MG tablet Take 1 tablet (40 mg total) by mouth daily. 01/16/22   [name], MD  loratadine (CLARITIN) 10 MG tablet Take 10 mg by mouth daily. 12/07/19   [provider]  metoprolol succinate (TOPROL-XL) 100 MG 24 hr tablet Take 1 tablet (100 mg total) by mouth daily before breakfast. Take with or immediately following a meal. 08/29/17   Medina-Vargas, Monina C, NP  mometasone (ELOCON) 0.1 % ointment See admin instructions. 02/06/22   [provider]  NOVOLIN N 100 UNIT/ML injection Inject 35 Units into the skin 2 (two) times daily after a meal. 35 units in the morning and 15 units at night time. 08/08/18   [provider]  olopatadine (PATANOL) 0.1 % ophthalmic solution Place 1 drop into both eyes 2 (two) times daily. 03/03/20   [provider]  [facility] ULTRA test strip 2 (two) times daily. 01/14/20   [provider]  oxybutynin (DITROPAN) 5 MG tablet Take 1 tablet (5 mg total) by mouth daily. 08/29/17   Medina-Vargas, Monina C, NP  pantoprazole (PROTONIX) 40 MG tablet Take 1 tablet (40 mg total) by mouth daily. 08/29/17   Medina-Vargas, Monina C, NP  Semaglutide,0.25 or 0.5MG/DOS, (OZEMPIC, 0.25 OR 0.5 MG/DOSE,) 2 MG/3ML SOPN Inject 0.106m subcutaneously once  weekly for 4 weeks, then increase to 0.528msubcutaneously once weekly 01/17/22   PeFreada BergeronMD  spironolactone (ALDACTONE) 25 MG tablet Take 1 tablet (25 mg total) by mouth daily. 01/16/22   PeFreada BergeronMD  traMADol (ULTRAM) 50 MG tablet Take 2 tablets (100 mg total) by mouth every 8 (eight) hours as needed for severe pain. 08/29/17   Medina-Vargas, Monina C, NP  Vitamin D, Ergocalciferol, (DRISDOL) 1.25 MG (50000 UNIT) CAPS capsule Take 1 capsule (50,000 Units total) by mouth every 7 (seven) days. 02/16/22   [place], KaBerna SpareNP    Family History Family History  Problem Relation Age of Onset   Obesity Mother    Kidney disease Mother    Heart disease Mother    Hyperlipidemia Mother    Hypertension Mother    Diabetes Mother    Asthma Mother    Anxiety disorder Father    Depression Father    Heart disease Father    Hyperlipidemia Father    Hypertension Father    Diabetes Father     Social History Social History   Tobacco Use   Smoking status: Never   Smokeless tobacco: Never  Vaping Use   Vaping Use: Never used  Substance Use Topics   Alcohol use: No   Drug use: No     Allergies   Contrast media [iodinated contrast media], Empagliflozin, and Metformin   Review of Systems Review of Systems  Skin:  Positive for wound (both legs with pain, swelling.).     Physical Exam Triage Vital Signs ED Triage Vitals  Enc Vitals Group     BP 04/03/22 1251 (!) 157/67     Pulse Rate 04/03/22 1251 78     Resp 04/03/22 1251 (!) 24     Temp 04/03/22 1251 98.1 F (36.7 C)     Temp Source 04/03/22 1251 Oral     SpO2 04/03/22 1251 98 %     Weight --      Height --      Head Circumference --  Peak Flow --      Pain Score 04/03/22 1250 10     Pain Loc --      Pain Edu? --      Excl. in [place]? --    No data found.  Updated Vital Signs BP (!) 157/67 (BP Location: Left Arm)   Pulse 78   Temp 98.1 F (36.7 C) (Oral)   Resp (!) 24   SpO2 98%   Visual  Acuity Right Eye Distance:   Left Eye Distance:   Bilateral Distance:    Right Eye Near:   Left Eye Near:    Bilateral Near:     Physical Exam Constitutional:      Appearance: Normal appearance.  Skin:         Comments: Bilateral lower legs: Both lower legs have the presence of severe stasis dermatitis without ulcer formation.   The right lower leg is worse with stasis dermatitis ranging from just below the knee to lower part of the ankle, 3+ swelling, redness associated with weeping throughout the lower leg.  No ulcerations present. The left lower leg has stasis dermatitis present from 4 inches below the knee to the lower part of the ankle, no ulcers and no weeping present.  Neurological:     Mental Status: She is alert.      UC Treatments / Results  Labs (all labs ordered are listed, but only abnormal results are displayed) Labs Reviewed - No data to display  EKG   Radiology No results found.  Procedures Procedures (including critical care time)  Medications Ordered in UC Medications - No data to display  Initial Impression / Assessment and Plan / UC Course  I have reviewed the triage vital signs and the nursing notes.  Pertinent labs & imaging results that were available during my care of the patient were reviewed by me and considered in my medical decision making (see chart for details).    Plan: 1.  Wet-to-dry dressings are applied to both lower legs. 2.  Advised patient take Keflex 500 mg 1 every 8 hours till completed. 3.  Advised patient use the tramadol 50 mg 1-2 every 6-8 hours as needed for pain. 4.  Patient has been referred to [facility] center for evaluation and treatment of the stasis ulcers. 1.  Patient advised to follow-up with PCP or return to urgent care if symptoms fail to improve. Final Clinical Impressions(s) / UC Diagnoses   Final diagnoses:  Pain in both lower legs  Venous stasis dermatitis of both lower extremities   Cellulitis of right lower leg     Discharge Instructions      I have made a referral to the wound center at [facility] here in [place], they should be calling you to arrange an appointment within the next 24 to 48 hours. Advised to take the Keflex 500 mg 1 every 8 hours until completed to treat infection. Advised to take the tramadol that you have at home in order to help control the pain.    ED Prescriptions     Medication Sig Dispense Auth. Provider   cephALEXin (KEFLEX) 500 MG capsule Take 1 capsule (500 mg total) by mouth 3 (three) times daily. 20 capsule Nyoka Lint, PA-C      PDMP not reviewed this encounter.   Nyoka Lint, PA-C 04/03/22 1401

## 2022-04-03 NOTE — ED Triage Notes (Signed)
Pt c/o bilat leg pains like bee stinging. Reports when took pantyhose off yesterday skin was coming off. Some redness noted on lower legs.

## 2022-04-08 DIAGNOSIS — G4733 Obstructive sleep apnea (adult) (pediatric): Secondary | ICD-10-CM | POA: Diagnosis not present

## 2022-04-10 ENCOUNTER — Encounter (HOSPITAL_BASED_OUTPATIENT_CLINIC_OR_DEPARTMENT_OTHER): Payer: HMO | Attending: Internal Medicine | Admitting: Internal Medicine

## 2022-04-10 DIAGNOSIS — E11628 Type 2 diabetes mellitus with other skin complications: Secondary | ICD-10-CM | POA: Diagnosis not present

## 2022-04-10 DIAGNOSIS — E1151 Type 2 diabetes mellitus with diabetic peripheral angiopathy without gangrene: Secondary | ICD-10-CM | POA: Insufficient documentation

## 2022-04-10 DIAGNOSIS — I872 Venous insufficiency (chronic) (peripheral): Secondary | ICD-10-CM | POA: Insufficient documentation

## 2022-04-10 DIAGNOSIS — Z794 Long term (current) use of insulin: Secondary | ICD-10-CM | POA: Insufficient documentation

## 2022-04-10 DIAGNOSIS — I5022 Chronic systolic (congestive) heart failure: Secondary | ICD-10-CM | POA: Insufficient documentation

## 2022-04-10 DIAGNOSIS — I890 Lymphedema, not elsewhere classified: Secondary | ICD-10-CM | POA: Diagnosis not present

## 2022-04-10 DIAGNOSIS — I110 Hypertensive heart disease with heart failure: Secondary | ICD-10-CM | POA: Diagnosis not present

## 2022-04-10 NOTE — Progress Notes (Signed)
Whitney [name], Whitney [name] ([id number]) Visit Report for 04/10/2022 Allergy List Details Patient Name: Date of Service: Whitney [name], Whitney [place] A. 04/10/2022 8:00 A M Medical Record Number: [id number] Patient Account Number: [id number] Date of Birth/Sex: Treating RN: [birth year] (66 y.o. Benjaman Lobe Primary Care Cory Rama: Dorthy Cooler, Dibas Other Clinician: Referring Birtha Hatler: Treating Peg Fifer/Extender: Yvone Neu MES, DA V ID Weeks in Treatment: 0 Allergies Active Allergies Iodinated Contrast- Oral and IV Dye metformin empagliflozin Allergy Notes Electronic Signature(s) Signed: 04/10/2022 4:44:26 PM By: Rhae Hammock RN Entered By: Rhae Hammock on 04/10/2022 09:53:40 -------------------------------------------------------------------------------- Arrival Information Details Patient Name: Date of Service: Whitney [name], Whitney [name] 04/10/2022 8:00 A M Medical Record Number: [id number] Patient Account Number: [id number] Date of Birth/Sex: Treating RN: 09-Nov-1955 (66 y.o. [name], Lauren Primary Care Edrik Rundle: Dorthy Cooler, Dibas Other Clinician: Referring Bhavik Cabiness: Treating Domenico Achord/Extender: Yvone Neu MES, DA V ID Weeks in Treatment: 0 Visit Information Patient Arrived: Walker Arrival Time: 09:51 Accompanied By: self Transfer Assistance: None Patient Identification Verified: Yes Secondary Verification Process Completed: Yes Patient Requires Transmission-Based Precautions: No Patient Has Alerts: No History Since Last Visit Added or deleted any medications: No Any new allergies or adverse reactions: No Had a fall or experienced change in activities of daily living that may affect risk of falls: No Signs or symptoms of abuse/neglect since last visito No Hospitalized since last visit: No Implantable device outside of the clinic excluding cellular tissue based products placed in the center since last visit: No Electronic Signature(s) Signed: 04/10/2022 4:44:26 PM By:  Rhae Hammock RN Entered By: Rhae Hammock on 04/10/2022 09:51:59 -------------------------------------------------------------------------------- Clinic Level of Care Assessment Details Patient Name: Date of Service: Whitney [name], Whitney [name] 04/10/2022 8:00 A M Medical Record Number: [id number] Patient Account Number: [id number] Date of Birth/Sex: Treating RN: 1956/05/12 (66 y.o. [name], Lauren Primary Care Leilanny Fluitt: Dorthy Cooler, Dibas Other Clinician: Referring Royelle Hinchman: Treating Kamee Bobst/Extender: Yvone Neu MES, DA V ID Weeks in Treatment: 0 Clinic Level of Care Assessment Items TOOL 4 Quantity Score X- 1 0 Use when only an EandM is performed on FOLLOW-UP visit ASSESSMENTS - Nursing Assessment / Reassessment X- 1 10 Reassessment of Co-morbidities (includes updates in patient status) X- 1 5 Reassessment of Adherence to Treatment Plan ASSESSMENTS - Wound and Skin A ssessment / Reassessment '[]'$  - 0 Simple Wound Assessment / Reassessment - one wound '[]'$  - 0 Complex Wound Assessment / Reassessment - multiple wounds '[]'$  - 0 Dermatologic / Skin Assessment (not related to wound area) ASSESSMENTS - Focused Assessment '[]'$  - 0 Circumferential Edema Measurements - multi extremities '[]'$  - 0 Nutritional Assessment / Counseling / Intervention '[]'$  - 0 Lower Extremity Assessment (monofilament, tuning fork, pulses) '[]'$  - 0 Peripheral Arterial Disease Assessment (using hand held doppler) ASSESSMENTS - Ostomy and/or Continence Assessment and Care '[]'$  - 0 Incontinence Assessment and Management '[]'$  - 0 Ostomy Care Assessment and Management (repouching, etc.) PROCESS - Coordination of Care X - Simple Patient / Family Education for ongoing care 1 15 '[]'$  - 0 Complex (extensive) Patient / Family Education for ongoing care X- 1 10 Staff obtains Programmer, systems, Records, T Results / Process Orders est '[]'$  - 0 Staff telephones HHA, Nursing Homes / Clarify orders / etc '[]'$  - 0 Routine Transfer to  another Facility (non-emergent condition) '[]'$  - 0 Routine Hospital Admission (non-emergent condition) X- 1 15 New Admissions / Biomedical engineer / Ordering NPWT Apligraf, etc. , '[]'$  - 0 Emergency Hospital Admission (emergent condition) X- 1 10 Simple Discharge Coordination '[]'$  -  0 Complex (extensive) Discharge Coordination PROCESS - Special Needs '[]'$  - 0 Pediatric / Minor Patient Management '[]'$  - 0 Isolation Patient Management '[]'$  - 0 Hearing / Language / Visual special needs '[]'$  - 0 Assessment of Community assistance (transportation, D/C planning, etc.) '[]'$  - 0 Additional assistance / Altered mentation '[]'$  - 0 Support Surface(s) Assessment (bed, cushion, seat, etc.) INTERVENTIONS - Wound Cleansing / Measurement '[]'$  - 0 Simple Wound Cleansing - one wound '[]'$  - 0 Complex Wound Cleansing - multiple wounds '[]'$  - 0 Wound Imaging (photographs - any number of wounds) '[]'$  - 0 Wound Tracing (instead of photographs) '[]'$  - 0 Simple Wound Measurement - one wound '[]'$  - 0 Complex Wound Measurement - multiple wounds INTERVENTIONS - Wound Dressings '[]'$  - 0 Small Wound Dressing one or multiple wounds '[]'$  - 0 Medium Wound Dressing one or multiple wounds '[]'$  - 0 Large Wound Dressing one or multiple wounds '[]'$  - 0 Application of Medications - topical '[]'$  - 0 Application of Medications - injection INTERVENTIONS - Miscellaneous '[]'$  - 0 External ear exam '[]'$  - 0 Specimen Collection (cultures, biopsies, blood, body fluids, etc.) '[]'$  - 0 Specimen(s) / Culture(s) sent or taken to Lab for analysis '[]'$  - 0 Patient Transfer (multiple staff / Civil Service fast streamer / Similar devices) '[]'$  - 0 Simple Staple / Suture removal (25 or less) '[]'$  - 0 Complex Staple / Suture removal (26 or more) '[]'$  - 0 Hypo / Hyperglycemic Management (close monitor of Blood Glucose) '[]'$  - 0 Ankle / Brachial Index (ABI) - do not check if billed separately X- 1 5 Vital Signs Has the patient been seen at the hospital within the last  three years: Yes Total Score: 70 Level Of Care: New/Established - Level 2 Electronic Signature(s) Signed: 04/10/2022 4:44:26 PM By: Rhae Hammock RN Entered By: Rhae Hammock on 04/10/2022 09:57:59 -------------------------------------------------------------------------------- Encounter Discharge Information Details Patient Name: Date of Service: Whitney [name], Whitney [name] 04/10/2022 8:00 A M Medical Record Number: [id number] Patient Account Number: [id number] Date of Birth/Sex: Treating RN: 05/07/1956 (66 y.o. Benjaman Lobe Primary Care Winthrop Shannahan: Dorthy Cooler, Dibas Other Clinician: Referring Dustyn Dansereau: Treating Zalman Hull/Extender: Yvone Neu MES, DA V ID Weeks in Treatment: 0 Encounter Discharge Information Items Discharge Condition: Stable Ambulatory Status: Walker Discharge Destination: Home Transportation: Private Auto Accompanied By: self Schedule Follow-up Appointment: Yes Clinical Summary of Care: Patient Declined Electronic Signature(s) Signed: 04/10/2022 4:44:26 PM By: Rhae Hammock RN Entered By: Rhae Hammock on 04/10/2022 09:58:31 -------------------------------------------------------------------------------- Lower Extremity Assessment Details Patient Name: Date of Service: Whitney [name], Whitney [name] 04/10/2022 8:00 A M Medical Record Number: [id number] Patient Account Number: [id number] Date of Birth/Sex: Treating RN: [birth year] (66 y.o. Benjaman Lobe Primary Care Laticia Vannostrand: Dorthy Cooler, Dibas Other Clinician: Referring Kayleana Waites: Treating Finlee Milo/Extender: Yvone Neu MES, DA V ID Weeks in Treatment: 0 Electronic Signature(s) Signed: 04/10/2022 4:44:26 PM By: Rhae Hammock RN Entered By: Rhae Hammock on 04/10/2022 09:55:32 -------------------------------------------------------------------------------- Multi Wound Chart Details Patient Name: Date of Service: Whitney [name], Whitney [name] 04/10/2022 8:00 A M Medical Record Number:  [id number] Patient Account Number: [id number] Date of Birth/Sex: Treating RN: 1956-01-14 (66 y.o. Benjaman Lobe Primary Care Shakeela Rabadan: Dorthy Cooler, Dibas Other Clinician: Referring Magnum Lunde: Treating Mardi Cannady/Extender: Yvone Neu MES, DA V ID Weeks in Treatment: 0 Vital Signs Height(in): Capillary Blood Glucose(mg/dl): 350 Weight(lbs): Pulse(bpm): 74 Body Mass Index(BMI): Blood Pressure(mmHg): 134/73 Temperature(F): 98.7 Respiratory Rate(breaths/min): 17 Wound Assessments Treatment Notes Electronic Signature(s) Signed: 04/10/2022 11:05:37 AM By: Kalman Shan DO Signed: 04/10/2022 4:44:26  PM By: Rhae Hammock RN Entered By: Kalman Shan on 04/10/2022 10:29:00 -------------------------------------------------------------------------------- Multi-Disciplinary Care Plan Details Patient Name: Date of Service: Whitney [name], Whitney [name] 04/10/2022 8:00 A M Medical Record Number: [id number] Patient Account Number: [id number] Date of Birth/Sex: Treating RN: 09-Jun-1956 (66 y.o. Benjaman Lobe Primary Care Mathis Cashman: Dorthy Cooler, Dibas Other Clinician: Referring Makenleigh Crownover: Treating Savannaha Stonerock/Extender: Yvone Neu MES, DA V ID Weeks in Treatment: 0 Active Inactive Electronic Signature(s) Signed: 04/10/2022 4:44:26 PM By: Rhae Hammock RN Entered By: Rhae Hammock on 04/10/2022 09:57:15 -------------------------------------------------------------------------------- Pain Assessment Details Patient Name: Date of Service: Whitney [name], Whitney [name] 04/10/2022 8:00 A M Medical Record Number: [id number] Patient Account Number: [id number] Date of Birth/Sex: Treating RN: 02-02-1956 (66 y.o. Benjaman Lobe Primary Care Jahn Franchini: Dorthy Cooler, Dibas Other Clinician: Referring Lavance Beazer: Treating Ignazio Kincaid/Extender: Yvone Neu MES, DA V ID Weeks in Treatment: 0 Active Problems Location of Pain Severity and Description of Pain Patient Has Paino Yes Site  Locations Pain Location: Generalized Pain With Dressing Change: No Duration of the Pain. Constant / Intermittento Constant Rate the pain. Current Pain Level: 5 Worst Pain Level: 10 Least Pain Level: 0 Tolerable Pain Level: 5 Character of Pain Describe the Pain: Aching Pain Management and Medication Current Pain Management: Medication: No Cold Application: No Rest: No Massage: No Activity: No T.E.N.S.: No Heat Application: No Leg drop or elevation: No Is the Current Pain Management Adequate: Adequate How does your wound impact your activities of daily livingo Sleep: No Bathing: No Appetite: No Relationship With Others: No Bladder Continence: No Emotions: No Bowel Continence: No Work: No Toileting: No Drive: No Dressing: No Hobbies: No Electronic Signature(s) Signed: 04/10/2022 4:44:26 PM By: Rhae Hammock RN Entered By: Rhae Hammock on 04/10/2022 09:55:55 -------------------------------------------------------------------------------- Patient/Caregiver Education Details Patient Name: Date of Service: Whitney [name], Whitney [facility] A. 7/11/2023andnbsp8:00 A M Medical Record Number: [id number] Patient Account Number: [id number] Date of Birth/Gender: Treating RN: 1956-09-15 (66 y.o. Benjaman Lobe Primary Care Physician: Dorthy Cooler, Dibas Other Clinician: Referring Physician: Treating Physician/Extender: Yvone Neu MES, DA V ID Weeks in Treatment: 0 Education Assessment Education Provided To: Patient Education Topics Provided Wound/Skin Impairment: Methods: Explain/Verbal Responses: State content correctly Electronic Signature(s) Signed: 04/10/2022 4:44:26 PM By: Rhae Hammock RN Entered By: Rhae Hammock on 04/10/2022 09:57:23 -------------------------------------------------------------------------------- Vitals Details Patient Name: Date of Service: Whitney [name], Whitney [name] 04/10/2022 8:00 A M Medical Record Number: [id number] Patient Account  Number: [id number] Date of Birth/Sex: Treating RN: Sep 06, 1956 (66 y.o. [name], Lauren Primary Care Tyrick Dunagan: Dorthy Cooler, Dibas Other Clinician: Referring Adlai Nieblas: Treating Tasha Jindra/Extender: Yvone Neu MES, DA V ID Weeks in Treatment: 0 Vital Signs Time Taken: 08:30 Temperature (F): 98.7 Pulse (bpm): 74 Respiratory Rate (breaths/min): 17 Blood Pressure (mmHg): 134/73 Capillary Blood Glucose (mg/dl): 350 Reference Range: 80 - 120 mg / dl Electronic Signature(s) Signed: 04/10/2022 4:44:26 PM By: Rhae Hammock RN Entered By: Rhae Hammock on 04/10/2022 09:52:27

## 2022-04-10 NOTE — Progress Notes (Signed)
ROZELLE, CAUDLE ([id number]) Visit Report for 04/10/2022 Chief Complaint Document Details Patient Name: Date of Service: Whitney [name] [place] A. 04/10/2022 8:00 A M Medical Record Number: [id number] Patient Account Number: [id number] Date of Birth/Sex: Treating RN: [birth year] (66 y.o. Whitney [name] Primary Care Provider: Dorthy Cooler, Dibas Other Clinician: Referring Provider: Treating Provider/Extender: Yvone Neu MES, DA V ID Weeks in Treatment: 0 Information Obtained from: Patient Chief Complaint 04/10/2022; history of wounds in the setting of venous insufficiency Electronic Signature(s) Signed: 04/10/2022 11:05:37 AM By: Kalman Shan DO Entered By: Kalman Shan on 04/10/2022 10:29:33 -------------------------------------------------------------------------------- HPI Details Patient Name: Date of Service: Whitney [name] Whitney A. 04/10/2022 8:00 A M Medical Record Number: [id number] Patient Account Number: [id number] Date of Birth/Sex: Treating RN: October 22, 1955 (66 y.o. Whitney [name] Primary Care Provider: Dorthy Cooler, Dibas Other Clinician: Referring Provider: Treating Provider/Extender: Yvone Neu MES, DA V ID Weeks in Treatment: 0 History of Present Illness HPI Description: 05/18/2019; patient was admitted to the clinic last week in my absence. The patient with lymphedema especially involving both thighs with severe chronic venous changes in the skin in both of her lower extremities to perhaps two thirds of the way to her tibial tuberosities bilaterally. She had weeping edema fluid last week from several small open areas bilaterally. We use silver alginate and 3 layer compression. The patient tells me that she has a history of wounds on her lower extremities but has not worn compression stockings ABIs in this clinic were 1 bilaterally 8/24; patient arrives back in clinic. She has lymphedema with severe chronic venous changes in the skin of both her lower  extremities. She has no open wound today on either lower extremity. Her edema control is excellent. We ordered her juxta lite stockings bilaterally and she brought these in today. 04/10/2022 Ms. Whitney [name] is a 66 year old female with a past medical history of controlled insulin-dependent type 2 diabetes, chronic systolic heart failure and venous insufficiency that presents to the clinic for history of weeping to her lower extremities bilaterally that occured 1 week ago. She states she visited the ED for this issue and she was prescribed Keflex. Over the past week her symptoms have improved and she no longer has any weeping. She has no open wounds. She does own compression stockings however does not wear these daily. Electronic Signature(s) Signed: 04/10/2022 11:05:37 AM By: Kalman Shan DO Entered By: Kalman Shan on 04/10/2022 10:32:41 -------------------------------------------------------------------------------- Physical Exam Details Patient Name: Date of Service: Whitney [name] Whitney A. 04/10/2022 8:00 A M Medical Record Number: [id number] Patient Account Number: [id number] Date of Birth/Sex: Treating RN: [birth year] (66 y.o. Whitney [name] Primary Care Provider: Dorthy Cooler, Dibas Other Clinician: Referring Provider: Treating Provider/Extender: Yvone Neu MES, DA V ID Weeks in Treatment: 0 Constitutional respirations regular, non-labored and within target range for patient.. Cardiovascular 2+ dorsalis pedis/posterior tibialis pulses. Psychiatric pleasant and cooperative. Notes Venous stasis dermatitis bilaterally to lower extremities. No open wounds. No weeping noted. 2+ pitting edema to the knee Electronic Signature(s) Signed: 04/10/2022 11:05:37 AM By: Kalman Shan DO Entered By: Kalman Shan on 04/10/2022 10:33:04 -------------------------------------------------------------------------------- Physician Orders Details Patient Name: Date of Service: Whitney  WA RD, [name] Whitney A. 04/10/2022 8:00 A M Medical Record Number: [id number] Patient Account Number: [id number] Date of Birth/Sex: Treating RN: 1955/11/25 (66 y.o. Whitney [name] Primary Care Provider: Dorthy Cooler, Dibas Other Clinician: Referring Provider: Treating Provider/Extender: Yvone Neu MES, DA V ID Weeks in Treatment: 0 Verbal / Phone  Orders: No Diagnosis Coding Discharge From [facility] Services Discharge from [place] Signature(s) Signed: 04/10/2022 11:05:37 AM By: Kalman Shan DO Entered By: Kalman Shan on 04/10/2022 10:33:11 -------------------------------------------------------------------------------- Problem List Details Patient Name: Date of Service: Whitney [name] Whitney A. 04/10/2022 8:00 A M Medical Record Number: [id number] Patient Account Number: [id number] Date of Birth/Sex: Treating RN: 1956-07-31 (66 y.o. Whitney [name] Primary Care Provider: Dorthy Cooler, Dibas Other Clinician: Referring Provider: Treating Provider/Extender: Yvone Neu MES, DA V ID Weeks in Treatment: 0 Active Problems ICD-10 Encounter Code Description Active Date MDM Diagnosis I87.2 Venous insufficiency (chronic) (peripheral) 04/10/2022 No Yes I89.0 Lymphedema, not elsewhere classified 04/10/2022 No Yes E11.628 Type 2 diabetes mellitus with other skin complications 0/93/2671 No Yes I45.80 Chronic systolic (congestive) heart failure 04/10/2022 No Yes Inactive Problems Resolved Problems Electronic Signature(s) Signed: 04/10/2022 11:05:37 AM By: Kalman Shan DO Entered By: Kalman Shan on 04/10/2022 10:28:53 -------------------------------------------------------------------------------- Progress Note Details Patient Name: Date of Service: Whitney [name] Whitney A. 04/10/2022 8:00 A M Medical Record Number: [id number] Patient Account Number: [id number] Date of Birth/Sex: Treating RN: [birth year] (67 y.o. Whitney [name] Primary Care Provider: Dorthy Cooler,  Dibas Other Clinician: Referring Provider: Treating Provider/Extender: Yvone Neu MES, DA V ID Weeks in Treatment: 0 Subjective Chief Complaint Information obtained from Patient 04/10/2022; history of wounds in the setting of venous insufficiency History of Present Illness (HPI) 05/18/2019; patient was admitted to the clinic last week in my absence. The patient with lymphedema especially involving both thighs with severe chronic venous changes in the skin in both of her lower extremities to perhaps two thirds of the way to her tibial tuberosities bilaterally. She had weeping edema fluid last week from several small open areas bilaterally. We use silver alginate and 3 layer compression. The patient tells me that she has a history of wounds on her lower extremities but has not worn compression stockings ABIs in this clinic were 1 bilaterally 8/24; patient arrives back in clinic. She has lymphedema with severe chronic venous changes in the skin of both her lower extremities. She has no open wound today on either lower extremity. Her edema control is excellent. We ordered her juxta lite stockings bilaterally and she brought these in today. 04/10/2022 Ms. Whitney [name] is a 66 year old female with a past medical history of controlled insulin-dependent type 2 diabetes, chronic systolic heart failure and venous insufficiency that presents to the clinic for history of weeping to her lower extremities bilaterally that occured 1 week ago. She states she visited the ED for this issue and she was prescribed Keflex. Over the past week her symptoms have improved and she no longer has any weeping. She has no open wounds. She does own compression stockings however does not wear these daily. Patient History Information obtained from Patient. Allergies Iodinated Contrast- Oral and IV Dye, metformin, empagliflozin Family History Cancer - Siblings, Diabetes - Mother,Father, Heart Disease - Father,  Hypertension - Mother,Father, Lung Disease - Mother, Stroke - Paternal Grandparents, No family history of Hereditary Spherocytosis, Kidney Disease, Seizures, Thyroid Problems, Tuberculosis. Social History Never smoker, Marital Status - Divorced, Alcohol Use - Never, Drug Use - No History, Caffeine Use - Moderate. Medical History Eyes Denies history of Cataracts, Glaucoma, Optic Neuritis Ear/Nose/Mouth/Throat Denies history of Chronic sinus problems/congestion, Middle ear problems Hematologic/Lymphatic Denies history of Anemia, Hemophilia, Human Immunodeficiency Virus, Lymphedema, Sickle Cell Disease Respiratory Denies history of Aspiration, Asthma, Chronic Obstructive Pulmonary Disease (COPD), Pneumothorax, Sleep Apnea, Tuberculosis Cardiovascular Patient has history  of Congestive Heart Failure, Hypertension, Peripheral Arterial Disease Denies history of Angina, Arrhythmia, Coronary Artery Disease, Deep Vein Thrombosis, Hypotension, Myocardial Infarction, Peripheral Venous Disease, Phlebitis, Vasculitis Gastrointestinal Denies history of Cirrhosis , Colitis, Crohnoos, Hepatitis A, Hepatitis B, Hepatitis C Endocrine Patient has history of Type II Diabetes Denies history of Type I Diabetes Genitourinary Denies history of End Stage Renal Disease Immunological Denies history of Lupus Erythematosus, Raynaudoos, Scleroderma Integumentary (Skin) Denies history of History of Burn Musculoskeletal Denies history of Gout, Rheumatoid Arthritis, Osteoarthritis, Osteomyelitis Neurologic Denies history of Dementia, Neuropathy, Quadriplegia, Paraplegia, Seizure Disorder Oncologic Denies history of Received Chemotherapy, Received Radiation Psychiatric Denies history of Anorexia/bulimia, Confinement Anxiety Objective Constitutional respirations regular, non-labored and within target range for patient.. Vitals Time Taken: 8:30 AM, Temperature: 98.7 F, Pulse: 74 bpm, Respiratory Rate: 17  breaths/min, Blood Pressure: 134/73 mmHg, Capillary Blood Glucose: 350 mg/dl. Cardiovascular 2+ dorsalis pedis/posterior tibialis pulses. Psychiatric pleasant and cooperative. General Notes: Venous stasis dermatitis bilaterally to lower extremities. No open wounds. No weeping noted. 2+ pitting edema to the knee Assessment Active Problems ICD-10 Venous insufficiency (chronic) (peripheral) Lymphedema, not elsewhere classified Type 2 diabetes mellitus with other skin complications Chronic systolic (congestive) heart failure Patient has a history of venous insufficiency. 1 week ago she had weeping to her legs and was prescribed Keflex with resolution of her symptoms. I recommended she use her compression stockings daily. Also recommended leg elevation. She expressed understanding. She may follow-up as needed. Plan Discharge From [facility] Services: Discharge from [place] 1. Compression stockings daily 2. Follow-up as needed Electronic Signature(s) Signed: 04/10/2022 11:05:37 AM By: Kalman Shan DO Entered By: Kalman Shan on 04/10/2022 10:34:36 -------------------------------------------------------------------------------- HxROS Details Patient Name: Date of Service: Whitney [name] Whitney A. 04/10/2022 8:00 A M Medical Record Number: [id number] Patient Account Number: [id number] Date of Birth/Sex: Treating RN: 1955-11-16 (66 y.o. Whitney [name] Primary Care Provider: Dorthy Cooler, Dibas Other Clinician: Referring Provider: Treating Provider/Extender: Yvone Neu MES, DA V ID Weeks in Treatment: 0 Information Obtained From Patient Eyes Medical History: Negative for: Cataracts; Glaucoma; Optic Neuritis Ear/Nose/Mouth/Throat Medical History: Negative for: Chronic sinus problems/congestion; Middle ear problems Hematologic/Lymphatic Medical History: Negative for: Anemia; Hemophilia; Human Immunodeficiency Virus; Lymphedema; Sickle Cell  Disease Respiratory Medical History: Negative for: Aspiration; Asthma; Chronic Obstructive Pulmonary Disease (COPD); Pneumothorax; Sleep Apnea; Tuberculosis Cardiovascular Medical History: Positive for: Congestive Heart Failure; Hypertension; Peripheral Arterial Disease Negative for: Angina; Arrhythmia; Coronary Artery Disease; Deep Vein Thrombosis; Hypotension; Myocardial Infarction; Peripheral Venous Disease; Phlebitis; Vasculitis Gastrointestinal Medical History: Negative for: Cirrhosis ; Colitis; Crohns; Hepatitis A; Hepatitis B; Hepatitis C Endocrine Medical History: Positive for: Type II Diabetes Negative for: Type I Diabetes Treated with: Insulin Blood sugar tested every day: No Genitourinary Medical History: Negative for: End Stage Renal Disease Immunological Medical History: Negative for: Lupus Erythematosus; Raynauds; Scleroderma Integumentary (Skin) Medical History: Negative for: History of Burn Musculoskeletal Medical History: Negative for: Gout; Rheumatoid Arthritis; Osteoarthritis; Osteomyelitis Neurologic Medical History: Negative for: Dementia; Neuropathy; Quadriplegia; Paraplegia; Seizure Disorder Oncologic Medical History: Negative for: Received Chemotherapy; Received Radiation Psychiatric Medical History: Negative for: Anorexia/bulimia; Confinement Anxiety Immunizations Pneumococcal Vaccine: Received Pneumococcal Vaccination: No Implantable Devices None Family and Social History Cancer: Yes - Siblings; Diabetes: Yes - Mother,Father; Heart Disease: Yes - Father; Hereditary Spherocytosis: No; Hypertension: Yes - Mother,Father; Kidney Disease: No; Lung Disease: Yes - Mother; Seizures: No; Stroke: Yes - Paternal Grandparents; Thyroid Problems: No; Tuberculosis: No; Never smoker; Marital Status - Divorced; Alcohol Use: Never; Drug Use: No History; Caffeine Use:  Moderate; Financial Concerns: No; Food, Clothing or Shelter Needs: No; Support System Lacking:  No Electronic Signature(s) Signed: 04/10/2022 11:05:37 AM By: Kalman Shan DO Signed: 04/10/2022 4:44:26 PM By: Rhae Hammock RN Entered By: Rhae Hammock on 04/10/2022 09:53:46 -------------------------------------------------------------------------------- SuperBill Details Patient Name: Date of Service: Whitney [name] Whitney A. 04/10/2022 Medical Record Number: [id number] Patient Account Number: [id number] Date of Birth/Sex: Treating RN: 01-28-1956 (66 y.o. Whitney [name] Primary Care Provider: Dorthy Cooler, Dibas Other Clinician: Referring Provider: Treating Provider/Extender: Yvone Neu MES, DA V ID Weeks in Treatment: 0 Diagnosis Coding ICD-10 Codes Code Description I87.2 Venous insufficiency (chronic) (peripheral) I89.0 Lymphedema, not elsewhere classified E11.628 Type 2 diabetes mellitus with other skin complications U31.49 Chronic systolic (congestive) heart failure Facility Procedures CPT4 Code: [id number] Description: [phone] - WOUND CARE VISIT-LEV 2 EST PT Modifier: Quantity: 1 Physician Procedures : CPT4 Code Description Modifier [id number] - WC PHYS LEVEL 3 - EST PT ICD-10 Diagnosis Description I87.2 Venous insufficiency (chronic) (peripheral) I89.0 Lymphedema, not elsewhere classified E11.628 Type 2 diabetes mellitus with other skin  complications O67.67 Chronic systolic (congestive) heart failure Quantity: 1 Electronic Signature(s) Signed: 04/10/2022 11:05:37 AM By: Kalman Shan DO Entered By: Kalman Shan on 04/10/2022 10:41:30

## 2022-04-10 NOTE — Progress Notes (Signed)
Whitney [name], Whitney [name] ([id number]) Visit Report for 04/10/2022 Abuse Risk Screen Details Patient Name: Date of Service: [name], [place] A. 04/10/2022 8:00 A M Medical Record Number: [id number] Patient Account Number: [id number] Date of Birth/Sex: Treating RN: 05-19-1956 (66 y.o. Whitney [name] Primary Care Whitney [name]: Whitney [name], Whitney [name] Other Clinician: Referring Whitney [name]: Treating Whitney [name]/Extender: Whitney [name], Whitney [name] Weeks in Treatment: 0 Abuse Risk Screen Items Answer ABUSE RISK SCREEN: Has anyone close to you tried to hurt or harm you recentlyo No Do you feel uncomfortable with anyone in your familyo No Has anyone forced you do things that you didnt want to doo No Electronic Signature(s) Signed: 04/10/2022 4:44:26 PM By: Whitney Hammock RN Entered By: Whitney [name] on 04/10/2022 09:53:51 -------------------------------------------------------------------------------- Activities of Daily Living Details Patient Name: Date of Service: [name], [name] 04/10/2022 8:00 A M Medical Record Number: [id number] Patient Account Number: [id number] Date of Birth/Sex: Treating RN: 1956-07-04 (66 y.o. Whitney [name] Primary Care Kimberley Dastrup: Whitney [name], Whitney [name] Other Clinician: Referring Saniyah Mondesir: Treating Cowan Pilar/Extender: Whitney [name], Whitney [name] Weeks in Treatment: 0 Activities of Daily Living Items Answer Activities of Daily Living (Please select one for each item) Drive Automobile Completely Able T Medications ake Completely Able Use T elephone Completely Able Care for Appearance Completely Able Use T oilet Completely Able Bath / Shower Completely Able Dress Self Completely Able Feed Self Completely Able Walk Completely Able Get In / Out Bed Completely Able Housework Completely Able Prepare Meals Completely [place] for Self Completely Able Electronic Signature(s) Signed: 04/10/2022 4:44:26 PM By: Whitney Hammock  RN Entered By: Whitney [name] on 04/10/2022 09:54:06 -------------------------------------------------------------------------------- Education Screening Details Patient Name: Date of Service: [name], [name] 04/10/2022 8:00 A M Medical Record Number: [id number] Patient Account Number: [id number] Date of Birth/Sex: Treating RN: 1956/09/27 (66 y.o. Whitney [name] Primary Care Shayleen Eppinger: Whitney [name], Whitney [name] Other Clinician: Referring [place] Masaki: Treating Clements Toro/Extender: Whitney [name], Whitney [name] Weeks in Treatment: 0 Primary Learner Assessed: Patient Learning Preferences/Education Level/Primary Language Learning Preference: Explanation, Demonstration, Communication Board, Printed Material Highest Education Level: College or Above Preferred Language: Diplomatic Services operational officer Language Barrier: No Translator Needed: No Memory Deficit: No Emotional Barrier: No Cultural/Religious Beliefs Affecting Medical Care: No Physical Barrier Impaired Vision: Yes Glasses Impaired Hearing: No Decreased Hand dexterity: No Knowledge/Comprehension Knowledge Level: High Comprehension Level: High Ability to understand written instructions: High Ability to understand verbal instructions: High Motivation Anxiety Level: Calm Cooperation: Cooperative Education Importance: Denies Need Interest in Health Problems: Asks Questions Perception: Coherent Willingness to Engage in Self-Management High Activities: Readiness to Engage in Self-Management High Activities: Electronic Signature(s) Signed: 04/10/2022 4:44:26 PM By: Whitney Hammock RN Entered By: Whitney [name] on 04/10/2022 09:54:30 -------------------------------------------------------------------------------- Fall Risk Assessment Details Patient Name: Date of Service: [name], [name] 04/10/2022 8:00 A M Medical Record Number: [id number] Patient Account Number: [id number] Date of Birth/Sex: Treating RN: Aug 21, 1956  (66 y.o. [name], Lauren Primary Care Chrishana Spargur: Whitney [name], Whitney [name] Other Clinician: Referring Ebelin Dillehay: Treating Tine [name]/Extender: Whitney [name], Whitney [name] Weeks in Treatment: 0 Fall Risk Assessment Items Have you had 2 or more falls in the last 12 monthso 0 No Have you had any fall that resulted in injury in the last 12 monthso 0 No FALLS RISK SCREEN History of falling - immediate or within 3 months 0 No Secondary diagnosis (Do you have 2 or more medical diagnoseso) 0 No Ambulatory aid None/bed rest/wheelchair/nurse  0 No Crutches/cane/walker 0 No Furniture 0 No Intravenous therapy Access/Saline/Heparin Lock 0 No Gait/Transferring Normal/ bed rest/ wheelchair 0 No Weak (short steps with or without shuffle, stooped but able to lift head while walking, may seek 0 No support from furniture) Impaired (short steps with shuffle, may have difficulty arising from chair, head down, impaired 0 No balance) Mental Status Oriented to own ability 0 No Electronic Signature(s) Signed: 04/10/2022 4:44:26 PM By: Whitney Hammock RN Entered By: Whitney [name] on 04/10/2022 09:54:35 -------------------------------------------------------------------------------- Foot Assessment Details Patient Name: Date of Service: [name], [name] 04/10/2022 8:00 A M Medical Record Number: [id number] Patient Account Number: [id number] Date of Birth/Sex: Treating RN: 1956/02/02 (66 y.o. Whitney [name] Primary Care Brees Hounshell: Whitney [name], Whitney [name] Other Clinician: Referring Shaleigh Laubscher: Treating Alaa Eyerman/Extender: Whitney [name], Whitney [name] Weeks in Treatment: 0 Foot Assessment Items Site Locations + = Sensation present, - = Sensation absent, C = Callus, U = Ulcer R = Redness, W = Warmth, M = Maceration, PU = Pre-ulcerative lesion F = Fissure, S = Swelling, D = Dryness Assessment Right: Left: Other Deformity: No No Prior Foot Ulcer: No No Prior Amputation: No No Charcot Joint: No  No Ambulatory Status: Ambulatory With Help Assistance Device: Walker Gait: Steady Electronic Signature(s) Signed: 04/10/2022 4:44:26 PM By: Whitney Hammock RN Entered By: Whitney [name] on 04/10/2022 09:55:18 -------------------------------------------------------------------------------- Nutrition Risk Screening Details Patient Name: Date of Service: [name], [name] 04/10/2022 8:00 A M Medical Record Number: [id number] Patient Account Number: [id number] Date of Birth/Sex: Treating RN: 12/31/1955 (66 y.o. Whitney [name] Primary Care Compton Brigance: Whitney [name], Whitney [name] Other Clinician: Referring Cayenne Breault: Treating Arnet Hofferber/Extender: Whitney [name], Whitney [name] Weeks in Treatment: 0 Height (in): Weight (lbs): Body Mass Index (BMI): Nutrition Risk Screening Items Score Screening NUTRITION RISK SCREEN: I have an illness or condition that made me change the kind and/or amount of food I eat 0 No I eat fewer than two meals per day 0 No I eat few fruits and vegetables, or milk products 0 No I have three or more drinks of beer, liquor or wine almost every day 0 No I have tooth or mouth problems that make it hard for me to eat 0 No I don't always have enough money to buy the food I need 0 No I eat alone most of the time 0 No I take three or more different prescribed or over-the-counter drugs a day 0 No Without wanting to, I have lost or gained 10 pounds in the last six months 0 No I am not always physically able to shop, cook and/or feed myself 0 No Nutrition Protocols Good Risk Protocol 0 No interventions needed Moderate Risk Protocol High Risk Proctocol Risk Level: Good Risk Score: 0 Electronic Signature(s) Signed: 04/10/2022 4:44:26 PM By: Whitney Hammock RN Entered By: Whitney [name] on 04/10/2022 09:54:40

## 2022-04-17 ENCOUNTER — Ambulatory Visit (INDEPENDENT_AMBULATORY_CARE_PROVIDER_SITE_OTHER): Payer: HMO | Admitting: Adult Health

## 2022-04-17 ENCOUNTER — Encounter (INDEPENDENT_AMBULATORY_CARE_PROVIDER_SITE_OTHER): Payer: Self-pay | Admitting: Adult Health

## 2022-04-17 VITALS — BP 135/72 | HR 72 | Temp 98.1°F | Ht 61.0 in | Wt 278.0 lb

## 2022-04-17 DIAGNOSIS — Z794 Long term (current) use of insulin: Secondary | ICD-10-CM

## 2022-04-17 DIAGNOSIS — E1169 Type 2 diabetes mellitus with other specified complication: Secondary | ICD-10-CM | POA: Diagnosis not present

## 2022-04-17 DIAGNOSIS — E669 Obesity, unspecified: Secondary | ICD-10-CM

## 2022-04-17 DIAGNOSIS — Z6841 Body Mass Index (BMI) 40.0 and over, adult: Secondary | ICD-10-CM

## 2022-04-17 DIAGNOSIS — E559 Vitamin D deficiency, unspecified: Secondary | ICD-10-CM | POA: Diagnosis not present

## 2022-04-17 MED ORDER — VITAMIN D (ERGOCALCIFEROL) 1.25 MG (50000 UNIT) PO CAPS: 50000.0000 [IU] | ORAL_CAPSULE | ORAL | 0 refills | Status: DC

## 2022-04-18 NOTE — Progress Notes (Signed)
Chief Complaint:   OBESITY Whitney [name] is here to discuss her progress with her obesity treatment plan along with follow-up of her obesity related diagnoses. Whitney [name] is on the Category 2 Plan and states she is following her eating plan approximately 10% of the time. Whitney [name] states she is doing 0 minutes 0 times per week.  Today's visit was #: 16 Starting weight: 313 lbs Starting date: 04/06/2021 Today's weight: 278 lbs Today's date: 04/17/2022 Total lbs lost to date: 35 Total lbs lost since last in-office visit: 0  Interim History:  Since last OV: Whitney [name]'s 80 year old cousin passed away (grand mal seizure).  [name] 68 years old was found deceased in bed (cause of death is unknown). Due to these sudden deaths, she notes increase in cravings and challenged to eat on the prescribed meal plan.  Subjective:   1. Type 2 diabetes mellitus with other specified complication, with long-term current use of insulin (HCC) Whitney [name] is currently on Novolin N 35 units in the morning, and 15 units in the p.m. and is managed by her PCP.   Her fasting blood glucose readings range between 130-160, postprandial blood glucose readings range between 200-220.   No recent sx's of hypoglycemia.   Dr. Pemberton/Cardiologist provided Ozempic prescription, and she picked it up and has not started it due to concerns of medication side effects and cost of continuing GLP-1 therapy. I discussed risk and benefits of GLP-1 therapy with the patient.   She has no family history of MTC or MENS.  She denies personal history of pancreatitis, however her mother experienced pancreatitis.  Of note: Previously on Trulicity and stopped due to cost.  2. Vitamin D deficiency On 02/15/2022, Whitney [name]'s vitamin D level was 49.4, stable.  I discussed labs with the patient today.  Assessment/Plan:   1. Type 2 diabetes mellitus with other specified complication, with long-term current use of insulin (HCC) Whitney [name] will continue Novolin N per her  PCP, and she will consider starting GLP-1 therapy.  2. Vitamin D deficiency We will refill Ergo 50,000 units once weekly for 1 month.  - Vitamin D, Ergocalciferol, (DRISDOL) 1.25 MG (50000 UNIT) CAPS capsule; Take 1 capsule (50,000 Units total) by mouth every 7 (seven) days.  Dispense: 4 capsule; Refill: 0  3. Obesity, current BMI 52.5 Whitney [name] is currently in the action stage of change. As such, her goal is to continue with weight loss efforts. She has agreed to the Category 2 Plan.   Exercise goals: All adults should avoid inactivity. Some physical activity is better than none, and adults who participate in any amount of physical activity gain some health benefits.  Behavioral modification strategies: increasing lean protein intake, decreasing simple carbohydrates, meal planning and cooking strategies, keeping healthy foods in the home, and planning for success.  Whitney [name] has agreed to follow-up with our clinic in 4 weeks. She was informed of the importance of frequent follow-up visits to maximize her success with intensive lifestyle modifications for her multiple health conditions.   Objective:   Blood pressure 135/72, [name] 72, temperature 98.1 F (36.7 C), height '5\' 1"'$  (1.549 m), weight 278 lb (126.1 kg), SpO2 97 %. Body mass index is 52.53 kg/m.  General: Cooperative, alert, well developed, in no acute distress. HEENT: Conjunctivae and lids unremarkable. Cardiovascular: Regular rhythm.  Lungs: Normal work of breathing. Neurologic: No focal deficits.   Lab Results  Component Value Date   CREATININE 0.81 12/22/2021   BUN 22 12/22/2021   NA 140 12/22/2021  K 4.0 12/22/2021   CL 100 12/22/2021   CO2 29 12/22/2021   Lab Results  Component Value Date   ALT 11 06/26/2019   AST 11 (A) 03/29/2021   ALKPHOS 91 06/26/2019   BILITOT 1.2 06/26/2019   Lab Results  Component Value Date   HGBA1C 6.4 (H) 09/06/2021   HGBA1C 9.4 03/29/2021   HGBA1C 7.5 (H) 06/26/2019   HGBA1C 7.6 (H)  07/12/2017   HGBA1C 7.6 (H) 11/16/2011   No results found for: "INSULIN" Lab Results  Component Value Date   TSH 3.348 11/16/2011   Lab Results  Component Value Date   CHOL 184 03/29/2021   HDL 52 03/29/2021   LDLCALC 109 03/29/2021   TRIG 127 03/29/2021   Lab Results  Component Value Date   VD25OH 49.4 02/15/2022   VD25OH 39.6 09/06/2021   VD25OH 9.6 (L) 04/06/2021   Lab Results  Component Value Date   WBC 8.4 11/30/2021   HGB 11.8 (L) 11/30/2021   HCT 37.6 11/30/2021   MCV 86.6 11/30/2021   PLT 251 11/30/2021   Lab Results  Component Value Date   IRON 68 11/16/2011   TIBC 302 11/16/2011   FERRITIN 253 11/16/2011    Obesity Behavioral Intervention:   Approximately 15 minutes were spent on the discussion below.  ASK: We discussed the diagnosis of obesity with Whitney [name] today and Whitney [name] agreed to give [place] permission to discuss obesity behavioral modification therapy today.  ASSESS: Whitney [name] has the diagnosis of obesity and her BMI today is 52.5. Whitney [name] is in the action stage of change.   ADVISE: Whitney [name] was educated on the multiple health risks of obesity as well as the benefit of weight loss to improve her health. She was advised of the need for long term treatment and the importance of lifestyle modifications to improve her current health and to decrease her risk of future health problems.  AGREE: Multiple dietary modification options and treatment options were discussed and Whitney [name] agreed to follow the recommendations documented in the above note.  ARRANGE: Whitney [name] was educated on the importance of frequent visits to treat obesity as outlined per CMS and USPSTF guidelines and agreed to schedule her next follow up appointment today.  Attestation Statements:   Reviewed by clinician on day of visit: allergies, medications, problem list, medical history, surgical history, family history, social history, and previous encounter notes.   [name], am acting as  transcriptionist for [name], NP.  I have reviewed the above documentation for accuracy and completeness, and I agree with the above. - Ahriana Gunkel d. Jahkai Yandell, NP-C

## 2022-05-09 ENCOUNTER — Encounter (INDEPENDENT_AMBULATORY_CARE_PROVIDER_SITE_OTHER): Payer: Self-pay

## 2022-05-09 DIAGNOSIS — G4733 Obstructive sleep apnea (adult) (pediatric): Secondary | ICD-10-CM | POA: Diagnosis not present

## 2022-05-11 ENCOUNTER — Encounter (HOSPITAL_COMMUNITY): Payer: Self-pay | Admitting: *Deleted

## 2022-05-11 ENCOUNTER — Ambulatory Visit (HOSPITAL_COMMUNITY)
Admission: EM | Admit: 2022-05-11 | Discharge: 2022-05-11 | Disposition: A | Discharge status: Home / Self Care | Payer: HMO | Attending: Physician Assistant | Admitting: Physician Assistant

## 2022-05-11 ENCOUNTER — Ambulatory Visit (INDEPENDENT_AMBULATORY_CARE_PROVIDER_SITE_OTHER): Payer: HMO

## 2022-05-11 DIAGNOSIS — Z79899 Other long term (current) drug therapy: Secondary | ICD-10-CM | POA: Diagnosis not present

## 2022-05-11 DIAGNOSIS — U071 COVID-19: Secondary | ICD-10-CM | POA: Insufficient documentation

## 2022-05-11 DIAGNOSIS — Z7901 Long term (current) use of anticoagulants: Secondary | ICD-10-CM | POA: Insufficient documentation

## 2022-05-11 DIAGNOSIS — Z7982 Long term (current) use of aspirin: Secondary | ICD-10-CM | POA: Diagnosis not present

## 2022-05-11 DIAGNOSIS — J329 Chronic sinusitis, unspecified: Secondary | ICD-10-CM

## 2022-05-11 DIAGNOSIS — R051 Acute cough: Secondary | ICD-10-CM

## 2022-05-11 DIAGNOSIS — J40 Bronchitis, not specified as acute or chronic: Secondary | ICD-10-CM

## 2022-05-11 DIAGNOSIS — E119 Type 2 diabetes mellitus without complications: Secondary | ICD-10-CM | POA: Insufficient documentation

## 2022-05-11 DIAGNOSIS — R059 Cough, unspecified: Secondary | ICD-10-CM | POA: Diagnosis not present

## 2022-05-11 MED ORDER — AMOXICILLIN-POT CLAVULANATE 875-125 MG PO TABS: 1.0000 | ORAL_TABLET | Freq: Two times a day (BID) | ORAL | 0 refills | Status: DC

## 2022-05-11 MED ORDER — BENZONATATE 100 MG PO CAPS: 100.0000 mg | ORAL_CAPSULE | Freq: Three times a day (TID) | ORAL | 0 refills | Status: DC

## 2022-05-11 NOTE — ED Provider Notes (Signed)
[place]    CSN: [id number] Arrival date & time: 05/11/22  1230      History   Chief Complaint Chief Complaint  Patient presents with   Sore Throat   Cough    HPI Whitney [name] is a 66 y.o. female.   Patient presents today with a several week history of URI symptoms including cough, congestion, sore throat, headache, mild shortness of breath.  She has been taking aspirin and Coricidin with improvement but not resolution of symptoms.  She denies any chest pain, nausea, vomiting, weakness, syncope.  Denies any known sick contacts.  She denies any recent steroid use.  She has recently been treated with cephalexin for cellulitis on 04/03/2022 but has not had additional antibiotics since that time.  She does have a history of diabetes and reports that her blood sugars have been elevated since onset of illness as high as 340.  She denies any history of asthma, COPD, smoking.  She is concerned because symptoms persist and are worsening despite over a week of conservative treatment.    Past Medical History:  Diagnosis Date   Arthritis    left knee,right hip   Back pain    Cardiac arrest (HCC)    CHF (congestive heart failure) ([place])    Constipation    Depression    Diabetes mellitus    GERD (gastroesophageal reflux disease)    Glaucoma    Glaucoma    H/O cardiac arrest 11/2011   PEA   Heart murmur    Hyperlipidemia    Hypertension    Joint pain    Myocardial infarction [facility]) 2013   Palpitations    Sleep apnea    Bring machine,mask and tubing   Swelling     Patient Active Problem List   Diagnosis Date Noted   Allergic rhinitis due to pollen 03/02/2022   Atherosclerotic heart disease of native coronary artery without angina pectoris 03/02/2022   Chronic venous stasis dermatitis 03/02/2022   Decreased estrogen level 03/02/2022   Long term (current) use of insulin ([place]) 03/02/2022   Neuropathy 03/02/2022   Peripheral venous insufficiency 03/02/2022    Varicose veins of lower extremity with inflammation 03/02/2022   Plantar flexed metatarsal bone of left foot 06/26/2021   Plantar flexed metatarsal bone of right foot 06/26/2021   Diabetes mellitus ([place]) 06/20/2021   Vitamin D deficiency 05/15/2021   Detrusor muscle hypertonia 11/04/2019   Disorder of mitral valve 11/04/2019   Hypertension associated with type 2 diabetes mellitus ([place]) 11/04/2019   Gastro-esophageal reflux disease without esophagitis 11/04/2019   Hay fever 11/04/2019   Pure hypercholesterolemia 11/04/2019   Type 2 diabetes mellitus with hyperglycemia ([place]) 11/04/2019   Hypokalemia 06/25/2019   History of cardiomyopathy 06/25/2019   Pain due to onychomycosis of toenails of both feet 03/18/2019   Porokeratosis 03/18/2019   Acute right lumbar radiculopathy 07/25/2017   At risk for adverse drug event 07/16/2017   Humerus fracture 07/12/2017   Morbid obesity due to excess calories ([place]) 22/29/7989   Chronic systolic heart failure ([place]) 12/14/2015   Chest pain 12/14/2015   Angina decubitus ([place]) 12/14/2015   Adiposity 01/06/2013   Arthritis of knee, degenerative 01/06/2013   Osteoarthritis of both knees 01/06/2013   OSA (obstructive sleep apnea) 11/30/2011   Cardiac arrest ([place]) 11/13/2011   Diabetes mellitus type 2, uncontrolled 11/13/2011    Past Surgical History:  Procedure Laterality Date   BACK SURGERY     COLONOSCOPY N/A 09/12/2016   Procedure:  COLONOSCOPY;  Surgeon: Clarene Essex, MD;  Location: [facility];  Service: Endoscopy;  Laterality: N/A;   CYSTOSCOPY W/ URETERAL STENT PLACEMENT Left 01/04/2013   Procedure: CYSTOSCOPY WITH RETROGRADE PYELOGRAM/URETERAL STENT PLACEMENT;  Surgeon: Bernestine Amass, MD;  Location: [facility];  Service: Urology;  Laterality: Left;   CYSTOSCOPY WITH RETROGRADE PYELOGRAM, URETEROSCOPY AND STENT PLACEMENT Left 01/26/2013   Procedure: CYSTOSCOPY, JJ STENT REMOVAL, LEFT URETEROSCOPY, ;  Surgeon: Bernestine Amass, MD;  Location: [facility];   Service: Urology;  Laterality: Left;  CYSTO, JJ STENT REMOVAL, LEFT URETEROSCOPY    FOOT SURGERY     HERNIA REPAIR     HOLMIUM LASER APPLICATION Left 0/62/6948   Procedure: HOLMIUM LASER LITHOTRIPSY ;  Surgeon: Bernestine Amass, MD;  Location: [facility];  Service: Urology;  Laterality: Left;   KNEE SURGERY     left   PARTIAL HYSTERECTOMY      OB History     Gravida  0   Para  0   Term  0   Preterm  0   AB  0   Living  0      SAB  0   IAB  0   Ectopic  0   Multiple  0   Live Births  0            Home Medications    Prior to Admission medications   Medication Sig Start Date End Date Taking? Authorizing Provider  amLODipine (NORVASC) 5 MG tablet Take 1 tablet (5 mg total) by mouth daily. 01/16/22  Yes [name], MD  amoxicillin-clavulanate (AUGMENTIN) 875-125 MG tablet Take 1 tablet by mouth every 12 (twelve) hours. 05/11/22  Yes Otho Michalik, Derry Skill, PA-C  aspirin EC 81 MG tablet Take 1 tablet (81 mg total) by mouth daily. Swallow whole. 06/23/20  Yes [name], MD  atorvastatin (LIPITOR) 40 MG tablet Take 1 tablet (40 mg total) by mouth daily. 01/16/22  Yes [name], MD  BD INSULIN SYRINGE U/F 31G X 5/16" 0.5 ML MISC as directed.  03/02/19  Yes [provider]  benzonatate (TESSALON) 100 MG capsule Take 1 capsule (100 mg total) by mouth every 8 (eight) hours. 05/11/22  Yes Odie Edmonds K, PA-C  blood glucose meter kit and supplies KIT Dispense based on patient and insurance preference. Use up to four times daily as directed. (FOR ICD-9 250.00, 250.01). 09/02/17  Yes Medina-Vargas, Monina C, NP  Chlorphen-Phenyleph-APAP (CORICIDIN D COLD/FLU/SINUS) 2-5-325 MG TABS Take 1 tablet by mouth every 6 (six) hours as needed (for congestion or cold-like symptoms).    Yes [provider]  clobetasol ointment (TEMOVATE) 5.46 % Apply 1 application topically 2 (two) times daily as needed (as directed- avoid open wounds).  07/08/18  Yes [provider]  furosemide (LASIX) 40 MG tablet Take 1 tablet (40 mg total) by mouth daily. 02/14/22  Yes [name], MD  gabapentin (NEURONTIN) 300 MG capsule Take 300 mg by mouth See admin instructions. Take 300 mg by mouth two times a day and an additional 300 mg once daily as needed for nerve pain 03/11/19  Yes [provider]  Lancets (ONETOUCH DELICA PLUS [id number]) MISC USE TO TEST YOUR BLOOD SUGAR 2 TIMES A DAY 11/23/19  Yes [provider]  latanoprost (XALATAN) 0.005 % ophthalmic solution 1 drop at bedtime. 09/08/19  Yes [provider]  lisinopril (ZESTRIL) 40 MG tablet Take 1 tablet (40 mg total) by mouth daily. 01/16/22  Yes [name], MD  loratadine (CLARITIN) 10 MG tablet Take 10 mg by mouth daily. 12/07/19  Yes [provider]  metoprolol succinate (TOPROL-XL) 100 MG 24 hr tablet Take 1 tablet (100 mg total) by mouth daily before breakfast. Take with or immediately following a meal. 08/29/17  Yes Medina-Vargas, Monina C, NP  mometasone (ELOCON) 0.1 % ointment See admin instructions. 02/06/22  Yes [provider]  NOVOLIN N 100 UNIT/ML injection Inject 35 Units into the skin 2 (two) times daily after a meal. 35 units in the morning and 15 units at night time. 08/08/18  Yes [provider]  olopatadine (PATANOL) 0.1 % ophthalmic solution Place 1 drop into both eyes 2 (two) times daily. 03/03/20  Yes [provider]  ONETOUCH ULTRA test strip 2 (two) times daily. 01/14/20  Yes [provider]  oxybutynin (DITROPAN) 5 MG tablet Take 1 tablet (5 mg total) by mouth daily. 08/29/17  Yes Medina-Vargas, Monina C, NP  pantoprazole (PROTONIX) 40 MG tablet Take 1 tablet (40 mg total) by mouth daily. 08/29/17  Yes Medina-Vargas, Monina C, NP  Semaglutide,0.25 or 0.5MG/DOS, (OZEMPIC, 0.25 OR 0.5 MG/DOSE,) 2 MG/3ML SOPN Inject 0.59m subcutaneously once weekly for 4 weeks, then increase to 0.561msubcutaneously once weekly  01/17/22  Yes Pemberton, HeGreer EeMD  spironolactone (ALDACTONE) 25 MG tablet Take 1 tablet (25 mg total) by mouth daily. 01/16/22  Yes PeFreada BergeronMD  traMADol (ULTRAM) 50 MG tablet Take 2 tablets (100 mg total) by mouth every 8 (eight) hours as needed for severe pain. 08/29/17  Yes Medina-Vargas, Monina C, NP  Vitamin D, Ergocalciferol, (DRISDOL) 1.25 MG (50000 UNIT) CAPS capsule Take 1 capsule (50,000 Units total) by mouth every 7 (seven) days. 04/17/22  Yes Danford, KaBerna SpareNP    Family History Family History  Problem Relation Age of Onset   Obesity Mother    Kidney disease Mother    Heart disease Mother    Hyperlipidemia Mother    Hypertension Mother    Diabetes Mother    Asthma Mother    Anxiety disorder Father    Depression Father    Heart disease Father    Hyperlipidemia Father    Hypertension Father    Diabetes Father     Social History Social History   Tobacco Use   Smoking status: Never   Smokeless tobacco: Never  Vaping Use   Vaping Use: Never used  Substance Use Topics   Alcohol use: No   Drug use: No     Allergies   Contrast media [iodinated contrast media], Empagliflozin, and Metformin   Review of Systems Review of Systems  Constitutional:  Positive for activity change, appetite change and fatigue. Negative for fever.  HENT:  Positive for congestion and sore throat. Negative for sinus pressure and sneezing.   Respiratory:  Positive for cough and shortness of breath.   Cardiovascular:  Negative for chest pain.  Gastrointestinal:  Negative for abdominal pain, diarrhea, nausea and vomiting.  Neurological:  Positive for headaches. Negative for dizziness and light-headedness.     Physical Exam Triage Vital Signs ED Triage Vitals  Enc Vitals Group     BP 05/11/22 1247 (!) 142/82     Pulse Rate 05/11/22 1247 87     Resp 05/11/22 1247 18     Temp 05/11/22 1247 98.7 F (37.1 C)     Temp Source 05/11/22 1247 Oral     SpO2 05/11/22 1247 97 %  Weight --      Height --      Head Circumference --      Peak Flow --      Pain Score 05/11/22 1245 7     Pain Loc --      Pain Edu? --      Excl. in [place]? --    No data found.  Updated Vital Signs BP (!) 142/82 (BP Location: Right Arm)   Pulse 87   Temp 98.7 F (37.1 C) (Oral)   Resp 18   SpO2 97%   Visual Acuity Right Eye Distance:   Left Eye Distance:   Bilateral Distance:    Right Eye Near:   Left Eye Near:    Bilateral Near:     Physical Exam Vitals reviewed.  Constitutional:      General: She is awake. She is not in acute distress.    Appearance: Normal appearance. She is well-developed. She is not ill-appearing.     Comments: Very pleasant female appears stated age in no acute distress sitting comfortably in exam room  HENT:     Head: Normocephalic and atraumatic.     Right Ear: External ear normal. There is impacted cerumen.     Left Ear: External ear normal. There is impacted cerumen.     Nose:     Right Sinus: Maxillary sinus tenderness and frontal sinus tenderness present.     Left Sinus: Maxillary sinus tenderness and frontal sinus tenderness present.     Mouth/Throat:     Pharynx: Uvula midline. Posterior oropharyngeal erythema present. No oropharyngeal exudate.     Comments: Erythema and drainage in posterior oropharynx Cardiovascular:     Rate and Rhythm: Normal rate and regular rhythm.     Heart sounds: Normal heart sounds, S1 normal and S2 normal. No murmur heard. Pulmonary:     Effort: Pulmonary effort is normal.     Breath sounds: Normal breath sounds. No wheezing, rhonchi or rales.     Comments: Decreased aeration bilateral bases Psychiatric:        Behavior: Behavior is cooperative.      UC Treatments / Results  Labs (all labs ordered are listed, but only abnormal results are displayed) Labs Reviewed  SARS CORONAVIRUS 2 (TAT 6-24 HRS)    EKG   Radiology DG Chest 2 View  Result Date: 05/11/2022 CLINICAL DATA:  Cough EXAM:  CHEST - 2 VIEW COMPARISON:  11/30/2021 FINDINGS: Stable cardiomegaly. No focal airspace consolidation, pleural effusion, or pneumothorax. Findings of DISH within thoracic spine. IMPRESSION: No active cardiopulmonary disease. Electronically Signed   By: Davina Poke D.O.   On: 05/11/2022 13:47    Procedures Procedures (including critical care time)  Medications Ordered in UC Medications - No data to display  Initial Impression / Assessment and Plan / UC Course  I have reviewed the triage vital signs and the nursing notes.  Pertinent labs & imaging results that were available during my care of the patient were reviewed by me and considered in my medical decision making (see chart for details).     Patient is well-appearing, afebrile, nontoxic, nontachycardic.  Given prolonged and worsening symptoms x-ray was obtained that showed no acute cardiopulmonary disease.  Given prolonged and worsening symptoms concern for secondary bacterial infection.  Patient was started on Augmentin twice daily for 7 days.  No indication for dose adjustment based on CMP from 12/22/2021 with creatinine of 0.81 and calculated creatinine clearance of 132.33 mL/min.  She was prescribed  Tessalon for cough.  At the end of visit patient requested COVID testing as someone she knows tested positive.  We discussed that even if she tested positive for COVID since she has had symptoms for several weeks she is outside the window of effectiveness for antiviral therapy and this would not change management.  Patient still preferred testing just to be safe.  We will contact her if this is positive.  She can use over-the-counter medications including Mucinex, Flonase, Tylenol for additional symptom relief.  She is to rest and drink plenty of fluid.  Recommended follow-up with her primary care provider next week.  If she has any worsening symptoms including chest pain, shortness of breath, fever, nausea/vomiting interfere with oral intake,  weakness that she needs to be seen immediately.  Strict return precautions given.  Final Clinical Impressions(s) / UC Diagnoses   Final diagnoses:  Sinobronchitis  Acute cough     Discharge Instructions      Your x-ray was normal.  I am concerned that you have a sinus infection that is draining and causing you to have such as severe cough.  Please start Augmentin twice daily.  Use Mucinex, Flonase, Tylenol for additional symptom relief.  I have called in Tessalon for cough.  You should drink plenty of fluid and rest until symptoms resolve.  Follow-up with your primary care if your symptoms are not improving by next week.  If you have anything that worsens including chest pain, shortness of breath, fever, nausea/vomiting interfere with oral intake you need to be seen immediately.     ED Prescriptions     Medication Sig Dispense Auth. Provider   amoxicillin-clavulanate (AUGMENTIN) 875-125 MG tablet Take 1 tablet by mouth every 12 (twelve) hours. 14 tablet Richie Bonanno K, PA-C   benzonatate (TESSALON) 100 MG capsule Take 1 capsule (100 mg total) by mouth every 8 (eight) hours. 21 capsule Syleena Mchan K, PA-C      PDMP not reviewed this encounter.   Terrilee Croak, PA-C 05/11/22 1357

## 2022-05-11 NOTE — ED Triage Notes (Signed)
Patient having dry cough x 1 week which makes her throat hurt. Complains of headache which was better with ASA 325.

## 2022-05-11 NOTE — Discharge Instructions (Addendum)
Your x-ray was normal.  I am concerned that you have a sinus infection that is draining and causing you to have such as severe cough.  Please start Augmentin twice daily.  Use Mucinex, Flonase, Tylenol for additional symptom relief.  I have called in Tessalon for cough.  You should drink plenty of fluid and rest until symptoms resolve.  Follow-up with your primary care if your symptoms are not improving by next week.  If you have anything that worsens including chest pain, shortness of breath, fever, nausea/vomiting interfere with oral intake you need to be seen immediately.

## 2022-05-13 LAB — SARS CORONAVIRUS 2 (TAT 6-24 HRS): SARS Coronavirus 2: POSITIVE — AB

## 2022-05-18 DIAGNOSIS — U071 COVID-19: Secondary | ICD-10-CM | POA: Diagnosis not present

## 2022-05-21 ENCOUNTER — Ambulatory Visit (INDEPENDENT_AMBULATORY_CARE_PROVIDER_SITE_OTHER): Payer: PPO | Admitting: Adult Health

## 2022-05-23 ENCOUNTER — Telehealth: Payer: Self-pay | Admitting: Podiatry

## 2022-05-23 NOTE — Telephone Encounter (Signed)
Pt called back and was informed that her shoes are in route to [facility].

## 2022-05-23 NOTE — Telephone Encounter (Signed)
Returning call to patient to advise that her shoes are in route to [facility] once received we will contact her. Unable to leave vm.

## 2022-06-06 ENCOUNTER — Ambulatory Visit: Payer: HMO | Admitting: Podiatry

## 2022-06-07 ENCOUNTER — Ambulatory Visit (INDEPENDENT_AMBULATORY_CARE_PROVIDER_SITE_OTHER): Payer: PPO | Admitting: Adult Health

## 2022-06-08 ENCOUNTER — Encounter: Payer: Self-pay | Admitting: Podiatry

## 2022-06-08 ENCOUNTER — Ambulatory Visit (INDEPENDENT_AMBULATORY_CARE_PROVIDER_SITE_OTHER): Payer: HMO | Admitting: Podiatry

## 2022-06-08 DIAGNOSIS — M79675 Pain in left toe(s): Secondary | ICD-10-CM

## 2022-06-08 DIAGNOSIS — M79674 Pain in right toe(s): Secondary | ICD-10-CM | POA: Diagnosis not present

## 2022-06-08 DIAGNOSIS — L84 Corns and callosities: Secondary | ICD-10-CM | POA: Diagnosis not present

## 2022-06-08 DIAGNOSIS — Q828 Other specified congenital malformations of skin: Secondary | ICD-10-CM | POA: Diagnosis not present

## 2022-06-08 DIAGNOSIS — B351 Tinea unguium: Secondary | ICD-10-CM | POA: Diagnosis not present

## 2022-06-08 DIAGNOSIS — E1165 Type 2 diabetes mellitus with hyperglycemia: Secondary | ICD-10-CM | POA: Diagnosis not present

## 2022-06-08 NOTE — Progress Notes (Signed)
This patient returns to my office for at risk foot care.  This patient requires this care by a professional since this patient will be at risk due to having diabetes.  Patient has painful corn fifth toe right foot and painful callus on the outside of both feet  B/L.  This patient is unable to cut nails herself since the patient cannot reach her nails.These nails are painful walking and wearing shoes.  This patient presents for at risk foot care today.  General Appearance  Alert, conversant and in no acute stress.  Vascular  Defeered due to wearing compression socks.  Patient has not been seen for 9 months.  Neurologic  Senn-Weinstein monofilament wire test diminished  bilaterally. Muscle power within normal limits bilaterally.  Nails Thick disfigured discolored nails with subungual debris  from hallux to fifth toes bilaterally. No evidence of bacterial infection or drainage bilaterally.  Orthopedic  No limitations of motion  feet .  No crepitus or effusions noted.  No bony pathology or digital deformities noted.  Adducto varus fifth digit  Right foot.  Plantar flexed fifth metatarsal  B/L.  Skin  normotropic skin with no porokeratosis noted bilaterally.  No signs of infections or ulcers noted. Porokeratosis sub 5th  B/L.  Corn 5th digit right foot.    Onychomycosis  Pain in right toes  Pain in left toes  Porokeratosis B/L  Corn fifth toe right foot.  Consent was obtained for treatment procedures.   Mechanical debridement of nails 1-5  bilaterally performed with a nail nipper.  Filed with dremel without incident. Debridement of callus  Both feet with # 15 blade and dremel tool.    Return office visit   3 months                   Told patient to return for periodic foot care and evaluation due to potential at risk complications.   Dellene Mcgroarty DPM  

## 2022-06-09 DIAGNOSIS — G4733 Obstructive sleep apnea (adult) (pediatric): Secondary | ICD-10-CM | POA: Diagnosis not present

## 2022-06-18 DIAGNOSIS — Z79899 Other long term (current) drug therapy: Secondary | ICD-10-CM | POA: Diagnosis not present

## 2022-06-18 DIAGNOSIS — E1169 Type 2 diabetes mellitus with other specified complication: Secondary | ICD-10-CM | POA: Diagnosis not present

## 2022-06-18 DIAGNOSIS — Z23 Encounter for immunization: Secondary | ICD-10-CM | POA: Diagnosis not present

## 2022-06-18 DIAGNOSIS — I5022 Chronic systolic (congestive) heart failure: Secondary | ICD-10-CM | POA: Diagnosis not present

## 2022-06-18 DIAGNOSIS — Z0001 Encounter for general adult medical examination with abnormal findings: Secondary | ICD-10-CM | POA: Diagnosis not present

## 2022-06-18 DIAGNOSIS — D649 Anemia, unspecified: Secondary | ICD-10-CM | POA: Diagnosis not present

## 2022-06-18 DIAGNOSIS — G629 Polyneuropathy, unspecified: Secondary | ICD-10-CM | POA: Diagnosis not present

## 2022-06-18 DIAGNOSIS — I872 Venous insufficiency (chronic) (peripheral): Secondary | ICD-10-CM | POA: Diagnosis not present

## 2022-06-18 DIAGNOSIS — H401131 Primary open-angle glaucoma, bilateral, mild stage: Secondary | ICD-10-CM | POA: Diagnosis not present

## 2022-06-18 DIAGNOSIS — E7800 Pure hypercholesterolemia, unspecified: Secondary | ICD-10-CM | POA: Diagnosis not present

## 2022-06-18 DIAGNOSIS — I2510 Atherosclerotic heart disease of native coronary artery without angina pectoris: Secondary | ICD-10-CM | POA: Diagnosis not present

## 2022-06-22 DIAGNOSIS — E1165 Type 2 diabetes mellitus with hyperglycemia: Secondary | ICD-10-CM | POA: Diagnosis not present

## 2022-06-28 ENCOUNTER — Ambulatory Visit (INDEPENDENT_AMBULATORY_CARE_PROVIDER_SITE_OTHER): Payer: PPO | Admitting: Adult Health

## 2022-07-04 ENCOUNTER — Encounter (INDEPENDENT_AMBULATORY_CARE_PROVIDER_SITE_OTHER): Payer: Self-pay | Admitting: Family Medicine

## 2022-07-04 ENCOUNTER — Ambulatory Visit (INDEPENDENT_AMBULATORY_CARE_PROVIDER_SITE_OTHER): Payer: HMO | Admitting: Family Medicine

## 2022-07-04 VITALS — BP 137/72 | HR 88 | Temp 97.9°F | Ht 61.0 in | Wt 286.0 lb

## 2022-07-04 DIAGNOSIS — E1169 Type 2 diabetes mellitus with other specified complication: Secondary | ICD-10-CM

## 2022-07-04 DIAGNOSIS — Z6841 Body Mass Index (BMI) 40.0 and over, adult: Secondary | ICD-10-CM

## 2022-07-04 DIAGNOSIS — E559 Vitamin D deficiency, unspecified: Secondary | ICD-10-CM

## 2022-07-04 DIAGNOSIS — E669 Obesity, unspecified: Secondary | ICD-10-CM

## 2022-07-04 DIAGNOSIS — Z7985 Long-term (current) use of injectable non-insulin antidiabetic drugs: Secondary | ICD-10-CM

## 2022-07-04 DIAGNOSIS — Z794 Long term (current) use of insulin: Secondary | ICD-10-CM

## 2022-07-04 MED ORDER — VITAMIN D (ERGOCALCIFEROL) 1.25 MG (50000 UNIT) PO CAPS: 50000.0000 [IU] | ORAL_CAPSULE | ORAL | 0 refills | Status: DC

## 2022-07-09 DIAGNOSIS — G4733 Obstructive sleep apnea (adult) (pediatric): Secondary | ICD-10-CM | POA: Diagnosis not present

## 2022-07-09 DIAGNOSIS — H401131 Primary open-angle glaucoma, bilateral, mild stage: Secondary | ICD-10-CM | POA: Diagnosis not present

## 2022-07-09 DIAGNOSIS — H04123 Dry eye syndrome of bilateral lacrimal glands: Secondary | ICD-10-CM | POA: Diagnosis not present

## 2022-07-10 NOTE — Progress Notes (Signed)
Chief Complaint:   OBESITY [name] is here to discuss her progress with her obesity treatment plan along with follow-up of her obesity related diagnoses. Whitney [name] is on the Category 2 Plan and states she is following her eating plan approximately 0% of the time. Dot states she is walking 3 times per week.    Today's visit was #: 2 Starting weight: 313 lbs Starting date: 04/06/2021 Today's weight: 286 lbs Today's date: 07/04/2022 Total lbs lost to date: 27 Total lbs lost since last in-office visit: 0  Interim History: Whitney [name] has not been able to follow her diet due to recent COVID illness.  She is anxious to get back on track.  She reports she was having to eat what ever her friends and family brought for her to eat.  Subjective:   1. Vitamin D deficiency Whitney [name] is taking Vitamin D 50,000 IU once weekly with no side effects noted.   2. Type 2 diabetes mellitus with other specified complication, with long-term current use of insulin (HCC) Whitney [name]'s blood sugars run at 199, and her last A1c was 7.7 per her visit with her PCP recently. She is taking insulin and she was prescribed Ozempic, but she does not feel comfortable with taking Ozempic due to a history of GI surgery and family history of pancreatitis in her mother.   Assessment/Plan:   1. Vitamin D deficiency We will refill prescription Vitamin D for 1 month.  We will recheck labs in the near future, if not checked by her PCP.   - Vitamin D, Ergocalciferol, (DRISDOL) 1.25 MG (50000 UNIT) CAPS capsule; Take 1 capsule (50,000 Units total) by mouth every 7 (seven) days.  Dispense: 4 capsule; Refill: 0  2. Type 2 diabetes mellitus with other specified complication, with long-term current use of insulin ([place]) Whitney [name] will resume her Category 2 diet, and she will continue her insulin, and check her blood sugars at home.   3. Obesity, Current BMI 54.1 Whitney [name] is currently in the action stage of change. As such, her goal is to continue with  weight loss efforts. She has agreed to the Category 2 Plan.   Resume Category 2 plan and reviewed the plan with the patient.   Exercise goals: As is.   Behavioral modification strategies: increasing lean protein intake, decreasing simple carbohydrates, meal planning and cooking strategies, and keeping healthy foods in the home.  Whitney [name] has agreed to follow-up with our clinic in 3 weeks. She was informed of the importance of frequent follow-up visits to maximize her success with intensive lifestyle modifications for her multiple health conditions.   Objective:   Blood pressure 137/72, pulse 88, temperature 97.9 F (36.6 C), height '5\' 1"'$  (1.549 m), weight 286 lb (129.7 kg), SpO2 98 %. Body mass index is 54.04 kg/m.  General: Cooperative, alert, well developed, in no acute distress. HEENT: Conjunctivae and lids unremarkable. Cardiovascular: Regular rhythm.  Lungs: Normal work of breathing. Neurologic: No focal deficits.   Lab Results  Component Value Date   CREATININE 0.81 12/22/2021   BUN 22 12/22/2021   NA 140 12/22/2021   K 4.0 12/22/2021   CL 100 12/22/2021   CO2 29 12/22/2021   Lab Results  Component Value Date   ALT 11 06/26/2019   AST 11 (A) 03/29/2021   ALKPHOS 91 06/26/2019   BILITOT 1.2 06/26/2019   Lab Results  Component Value Date   HGBA1C 6.4 (H) 09/06/2021   HGBA1C 9.4 03/29/2021   HGBA1C 7.5 (H) 06/26/2019  HGBA1C 7.6 (H) 07/12/2017   HGBA1C 7.6 (H) 11/16/2011   No results found for: "INSULIN" Lab Results  Component Value Date   TSH 3.348 11/16/2011   Lab Results  Component Value Date   CHOL 184 03/29/2021   HDL 52 03/29/2021   LDLCALC 109 03/29/2021   TRIG 127 03/29/2021   Lab Results  Component Value Date   VD25OH 49.4 02/15/2022   VD25OH 39.6 09/06/2021   VD25OH 9.6 (L) 04/06/2021   Lab Results  Component Value Date   WBC 8.4 11/30/2021   HGB 11.8 (L) 11/30/2021   HCT 37.6 11/30/2021   MCV 86.6 11/30/2021   PLT 251 11/30/2021    Lab Results  Component Value Date   IRON 68 11/16/2011   TIBC 302 11/16/2011   FERRITIN 253 11/16/2011   Attestation Statements:   Reviewed by clinician on day of visit: allergies, medications, problem list, medical history, surgical history, family history, social history, and previous encounter notes.  I have personally spent 43 minutes total time today in preparation, patient care, and documentation for this visit, including the following: review of clinical lab tests; review of medical tests/procedures/services.   I, Trixie Dredge, am acting as transcriptionist for Dennard Nip, MD.  I have reviewed the above documentation for accuracy and completeness, and I agree with the above. -  Dennard Nip, MD

## 2022-07-15 NOTE — Progress Notes (Unsigned)
Cardiology Office Note:    Date:  07/15/2022   ID:  Whitney [name], DOB December 04, 1955, MRN [id number]  PCP:  Whitney [name], [place]  Cardiologist:  Whitney Bergeron, MD  Advanced Practice Provider:  No care team member to display Electrophysiologist:  None    Referring MD: Whitney Amel, MD     History of Present Illness:    Whitney [name] is a 66 y.o. female with a hx of systolic heart failure with recovered EF from 30-35% to 50% on most recent TTE on 06/16/20, history of respiratory arrest in 2013 which resulted in a cardiac arrest, non-obstructive CAD on cath 2006, hypertension, hyperlipidemia, diabetes mellitus on insulin, obstructive sleep apnea on CPAP and morbid obesity who presents to clinic for follow-up.  Patient was admitted to [facility] in September 2020 where she had presented with chest pain. During that admission, echo demonstrated LVEF 55-60% which was improved from prior imaging.  High-sensitivity troponin was negative x3, BNP was normal.  Given reassuring work-up, it was determined that her chest pain was likely noncardiac in etiology and the patient was discharged home.  Seen in clinic by Whitney [name] on 07/2021 where she was having left chest tightness after eating thought to be due to gas. She was otherwise working on weight loss.   Was last seen in clinic on 12/2021 where she was stable from a CV standpoint. Was working on weight loss so she was eligible for a knee replacement. We started her on ozempic at that time.  Today, ***  Past Medical History:  Diagnosis Date   Arthritis    left knee,right hip   Back pain    Cardiac arrest (HCC)    CHF (congestive heart failure) (HCC)    Constipation    Depression    Diabetes mellitus    GERD (gastroesophageal reflux disease)    Glaucoma    Glaucoma    H/O cardiac arrest 11/2011   PEA   Heart murmur    Hyperlipidemia    Hypertension    Joint pain    Myocardial  infarction [facility]) 2013   Palpitations    Sleep apnea    Bring machine,mask and tubing   Swelling     Past Surgical History:  Procedure Laterality Date   BACK SURGERY     COLONOSCOPY N/A 09/12/2016   Procedure: COLONOSCOPY;  Surgeon: Clarene Essex, MD;  Location: [facility] ENDOSCOPY;  Service: Endoscopy;  Laterality: N/A;   CYSTOSCOPY W/ URETERAL STENT PLACEMENT Left 01/04/2013   Procedure: CYSTOSCOPY WITH RETROGRADE PYELOGRAM/URETERAL STENT PLACEMENT;  Surgeon: Whitney Amass, MD;  Location: [facility] ORS;  Service: Urology;  Laterality: Left;   CYSTOSCOPY WITH RETROGRADE PYELOGRAM, URETEROSCOPY AND STENT PLACEMENT Left 01/26/2013   Procedure: CYSTOSCOPY, JJ STENT REMOVAL, LEFT URETEROSCOPY, ;  Surgeon: Whitney Amass, MD;  Location: [facility] ORS;  Service: Urology;  Laterality: Left;  CYSTO, JJ STENT REMOVAL, LEFT URETEROSCOPY    FOOT SURGERY     HERNIA REPAIR     HOLMIUM LASER APPLICATION Left 7/85/8850   Procedure: HOLMIUM LASER LITHOTRIPSY ;  Surgeon: Whitney Amass, MD;  Location: [facility] ORS;  Service: Urology;  Laterality: Left;   KNEE SURGERY     left   PARTIAL HYSTERECTOMY      Current Medications: No outpatient medications have been marked as taking for the 07/18/22 encounter (Appointment) with Whitney Bergeron, MD.     Allergies:   Contrast media [iodinated contrast media],  Empagliflozin, and Metformin   Social History   Socioeconomic History   Marital status: Divorced    Spouse name: Not on file   Number of children: Not on file   Years of education: Not on file   Highest education level: Not on file  Occupational History   Occupation: Retired  Tobacco Use   Smoking status: Never   Smokeless tobacco: Never  Vaping Use   Vaping Use: Never used  Substance and Sexual Activity   Alcohol use: No   Drug use: No   Sexual activity: Never  Other Topics Concern   Not on file  Social History Narrative   Not on file   Social Determinants of Health   Financial Resource Strain: Not on  file  Food Insecurity: No Food Insecurity (07/03/2019)   Hunger Vital Sign    Worried About Running Out of Food in the Last Year: Never true    Ran Out of Food in the Last Year: Never true  Transportation Needs: No Transportation Needs (07/03/2019)   PRAPARE - Hydrologist (Medical): No    Lack of Transportation (Non-Medical): No  Physical Activity: Not on file  Stress: Not on file  Social Connections: Not on file     Family History: The patient's family history includes Anxiety disorder in her father; Asthma in her mother; Depression in her father; Diabetes in her father and mother; Heart disease in her father and mother; Hyperlipidemia in her father and mother; Hypertension in her father and mother; Kidney disease in her mother; Obesity in her mother.  ROS:   Please see the history of present illness.    Review of Systems  Constitutional:  Negative for chills, fever and malaise/fatigue.  HENT:  Negative for hearing loss and sore throat.   Eyes:  Negative for blurred vision and redness.  Respiratory:  Negative for shortness of breath.   Cardiovascular:  Positive for chest pain and leg swelling. Negative for palpitations, orthopnea, claudication and PND.  Gastrointestinal:  Negative for melena, nausea and vomiting.  Genitourinary:  Negative for dysuria and flank pain.  Musculoskeletal:  Positive for joint pain (L knee). Negative for myalgias.  Neurological:  Negative for dizziness and loss of consciousness.  Endo/Heme/Allergies:  Negative for environmental allergies.  Psychiatric/Behavioral:  Negative for substance abuse.     EKGs/Labs/Other Studies Reviewed:    The following studies were reviewed today: Myoview 07/2021:   The study is normal. The study is low risk.   No ST deviation was noted.   LV perfusion is normal.   Left ventricular function is normal. Nuclear stress EF: 61 %. The left ventricular ejection fraction is normal (55-65%).   Prior  study available for comparison from 12/27/2014. There are changes compared to prior study which appear to be resolved.  TTE 11/2021: IMPRESSIONS     1. Left ventricular ejection fraction, by estimation, is 50 to 55%. The  left ventricle has low normal function. The left ventricle has no regional  wall motion abnormalities. There is mild left ventricular hypertrophy.  Left ventricular diastolic  parameters are consistent with Grade I diastolic dysfunction (impaired  relaxation).   2. Right ventricular systolic function is normal. The right ventricular  size is normal. Tricuspid regurgitation signal is inadequate for assessing  PA pressure.   3. Left atrial size was mildly dilated.   4. The mitral valve is grossly normal. Trivial mitral valve  regurgitation.   5. The aortic valve was not well  visualized. Aortic valve regurgitation  is not visualized. Mild aortic valve stenosis.   6. The inferior vena cava is normal in size with <50% respiratory  variability, suggesting right atrial pressure of 8 mmHg.   Comparison(s): No prior Echocardiogram.   TTE 07/2021: IMPRESSIONS     1. Mild apical hypokinesis. . Left ventricular ejection fraction, by  estimation, is 55 to 60%. The left ventricle has normal function. The left  ventricular internal cavity size was mildly dilated. There is mild left  ventricular hypertrophy. Left  ventricular diastolic parameters are indeterminate.   2. Right ventricular systolic function is normal. The right ventricular  size is normal.   3. The mitral valve is normal in structure. Mild mitral valve  regurgitation.   4. AV is trileaflet Appears to open well. Peak and mean gradients through  the LV/AV/aorta are 25 and 14 mm Hg respectively Turbulence appears to  start above valve (see aorta) Gradients not signficantly changed from  previous echo . The aortic valve is  tricuspid. Aortic valve regurgitation is trivial. Mild aortic valve  sclerosis is present,  with no evidence of aortic valve stenosis.   5. There is a thickening/brightness supravalvularly at sinotublar  junction. Question mild ridge. Turbulence seems to start at this point.   6. The inferior vena cava is dilated in size with <50% respiratory  variability, suggesting right atrial pressure of 15 mmHg.   TTE 06/06/20: 1. Left ventricular ejection fraction, by estimation, is 50%. The left  ventricle has mildly decreased function. The left ventricle demonstrates  global hypokinesis. There is mild left ventricular hypertrophy. Left  ventricular diastolic parameters are  consistent with Grade I diastolic dysfunction (impaired relaxation).   2. Right ventricular systolic function is normal. The right ventricular  size is normal. Tricuspid regurgitation signal is inadequate for assessing  PA pressure.   3. Left atrial size was mildly dilated.   4. The mitral valve is normal in structure. No evidence of mitral valve  regurgitation. No evidence of mitral stenosis.   5. The aortic valve is tricuspid. Aortic valve regurgitation is not  visualized. Mild aortic valve stenosis. Aortic valve mean gradient  measures 9.0 mmHg (gradient not significantly elevated but visually at  least mild AS).   6. The inferior vena cava is dilated in size with >50% respiratory  variability, suggesting right atrial pressure of 8 mmHg.  TTE 06/26/19: 1. Left ventricular ejection fraction, by visual estimation, is 55 to  60%. The left ventricle has normal function. Moderately increased left  ventricular size. There is no left ventricular hypertrophy.   2. Definity contrast agent was given IV to delineate the left ventricular  endocardial borders.   3. Elevated mean left atrial pressure.   4. Left ventricular diastolic Doppler parameters are consistent with  impaired relaxation pattern of LV diastolic filling.   5. Global right ventricle has normal systolic function.The right  ventricular size is normal. No  increase in right ventricular wall  thickness.   6. The mitral valve is normal in structure. Mild mitral valve  regurgitation.   Nuclear stress 12/26/2015: Nuclear stress EF: 60%. There was no ST segment deviation noted during stress. Defect 1: There is a small defect of moderate severity present in the basal inferolateral and mid inferolateral location. Findings consistent with prior myocardial infarction. This is a low risk study. The left ventricular ejection fraction is normal (55-65%).   EKG: EKG was not ordered today  Recent Labs: 11/30/2021: B Natriuretic Peptide 103.7; Hemoglobin  11.8; Platelets 251 12/22/2021: BUN 22; Creatinine, Ser 0.81; Potassium 4.0; Sodium 140  Recent Lipid Panel    Component Value Date/Time   CHOL 184 03/29/2021 0000   TRIG 127 03/29/2021 0000   HDL 52 03/29/2021 0000   LDLCALC 109 03/29/2021 0000     Physical Exam:    VS:  There were no vitals taken for this visit.    Wt Readings from Last 3 Encounters:  07/04/22 286 lb (129.7 kg)  04/17/22 278 lb (126.1 kg)  02/15/22 277 lb (125.6 kg)     GEN:  Well nourished, well developed in no acute distress HEENT: Normal NECK: No JVD; No carotid bruits LYMPHATICS: No lymphadenopathy CARDIAC: RRR, 0-9/3 harsh systolic murmur, no rubs or gallops RESPIRATORY:  Clear to auscultation without rales, wheezing or rhonchi  ABDOMEN: Soft, non-tender, non-distended MUSCULOSKELETAL:  No edema; No deformity  SKIN: Warm and dry NEUROLOGIC:  Alert and oriented x 3 PSYCHIATRIC:  Normal affect   ASSESSMENT:    No diagnosis found.   PLAN:    In order of problems listed above:  #Chest Pain: #DOE:  Not likely cardiac and sounds consistent with indigestion. No exertional component. Overall stable to slightly improved from prior visits.Had similar symptoms in the past and work-up was reassuring.  -Continue CPAP -Nuc 2022 normal -Cath 2006 with nonobstructive CAD -Symptoms non-exertional and improve with  movement -Will continue to monitor and manage HF as below -Encourage weight loss as this may be contributing to symptoms   #Chronic systolic heart failure with recovered EF  EF originally 30-35% in the setting of a respiratory/cardiac arrest that improved to 50% on most recent TTE.  -Continue metoprolol '100mg'$  XL -Continue lisinopril '40mg'$  daily -Continue spironolactone '25mg'$  daily -Continue lasix '20mg'$  daily -Continue ASA to '81mg'$  daily -Continue ozempic  #Possible AS: TTE with elevated gradients, however, agree that the valve motion appears near normal however not all leaflets are well visualized and therefore restricted motion of one could be occurring. ? If the acceleration on color doppler is above the level of the valve leading to elevated gradient. Has 2/6 harsh systolic murmur on exam. Will continue monitoring. -Check TTE in 2 years   #Diabetes Mellitus on Insulin Much better controlled. A1C 6.4 -Continue ozempic   #Morbid Obesity BMI 53. Continuing to try to lose weight -Continue weight loss efforts -Discussed healthy diet and encouraged continued walking with her walker -Continue ozempic   #Hypertension Better controlled at home. -Continue amlopdipine '5mg'$  daily, lisinopril '40mg'$  daily, spiro '25mg'$  daily and metoprolol '100mg'$  XL as above   #Hyperlipidemia -Continue lipitor '40mg'$  daily  -Lipids per PCP  #Obstructive sleep apnea -Continue CPAP   Medication Adjustments/Labs and Tests Ordered: Current medicines are reviewed at length with the patient today.  Concerns regarding medicines are outlined above.  No orders of the defined types were placed in this encounter.  No orders of the defined types were placed in this encounter.   There are no Patient Instructions on file for this visit.   I,Mykaella Javier,acting as a scribe for Whitney Bergeron, MD.,have documented all relevant documentation on the behalf of Whitney Bergeron, MD,as directed by  Whitney Bergeron, MD while in the presence of Whitney Bergeron, MD.  I, Whitney Bergeron, MD, have reviewed all documentation for this visit. The documentation on 07/15/22 for the exam, diagnosis, procedures, and orders are all accurate and complete.   Signed, Whitney Bergeron, MD  07/15/2022 2:37 PM    [place]  [facility]

## 2022-07-18 ENCOUNTER — Encounter: Payer: Self-pay | Admitting: Cardiology

## 2022-07-18 ENCOUNTER — Ambulatory Visit: Payer: HMO | Attending: Cardiology | Admitting: Cardiology

## 2022-07-18 VITALS — BP 136/87 | HR 78 | Ht 62.0 in | Wt 293.6 lb

## 2022-07-18 DIAGNOSIS — G4733 Obstructive sleep apnea (adult) (pediatric): Secondary | ICD-10-CM

## 2022-07-18 DIAGNOSIS — L0390 Cellulitis, unspecified: Secondary | ICD-10-CM

## 2022-07-18 DIAGNOSIS — Z79899 Other long term (current) drug therapy: Secondary | ICD-10-CM | POA: Diagnosis not present

## 2022-07-18 DIAGNOSIS — E785 Hyperlipidemia, unspecified: Secondary | ICD-10-CM

## 2022-07-18 DIAGNOSIS — R2231 Localized swelling, mass and lump, right upper limb: Secondary | ICD-10-CM | POA: Diagnosis not present

## 2022-07-18 DIAGNOSIS — I10 Essential (primary) hypertension: Secondary | ICD-10-CM

## 2022-07-18 DIAGNOSIS — I5020 Unspecified systolic (congestive) heart failure: Secondary | ICD-10-CM

## 2022-07-18 DIAGNOSIS — R600 Localized edema: Secondary | ICD-10-CM

## 2022-07-18 DIAGNOSIS — E118 Type 2 diabetes mellitus with unspecified complications: Secondary | ICD-10-CM

## 2022-07-18 DIAGNOSIS — I5023 Acute on chronic systolic (congestive) heart failure: Secondary | ICD-10-CM

## 2022-07-18 DIAGNOSIS — Z794 Long term (current) use of insulin: Secondary | ICD-10-CM

## 2022-07-18 MED ORDER — POTASSIUM CHLORIDE CRYS ER 20 MEQ PO TBCR: EXTENDED_RELEASE_TABLET | ORAL | 0 refills | Status: DC

## 2022-07-18 MED ORDER — CEPHALEXIN 500 MG PO CAPS: 500.0000 mg | ORAL_CAPSULE | Freq: Two times a day (BID) | ORAL | 0 refills | Status: DC

## 2022-07-18 MED ORDER — FUROSEMIDE 40 MG PO TABS: ORAL_TABLET | ORAL | 0 refills | Status: DC

## 2022-07-18 NOTE — Progress Notes (Signed)
Cardiology Office Note:    Date:  07/18/2022   ID:  Whitney [name], DOB 05-09-1956, MRN [id number]  PCP:  [name], [place]  Cardiologist:  [name], MD  Advanced Practice Provider:  No care team member to display Electrophysiologist:  None   Referring MD: [name], MD    History of Present Illness:    Whitney [name] is a 66 y.o. female with a hx of systolic heart failure with recovered EF from 30-35% to 50% on most recent TTE on 06/16/20, history of respiratory arrest in 2013 which resulted in a cardiac arrest, non-obstructive CAD on cath 2006, hypertension, hyperlipidemia, diabetes mellitus on insulin, obstructive sleep apnea on CPAP and morbid obesity who presents to clinic for follow-up.  Patient was admitted to [facility] in September 2020 where she had presented with chest pain. During that admission, echo demonstrated LVEF 55-60% which was improved from prior imaging.  High-sensitivity troponin was negative x3, BNP was normal.  Given reassuring work-up, it was determined that her chest pain was likely noncardiac in etiology and the patient was discharged home.  Seen in clinic by Ermalinda Barrios on 07/2021 where she was having left chest tightness after eating thought to be due to gas. She was otherwise working on weight loss.   Was last seen in clinic on 12/2021 where she was stable from a CV standpoint. Was working on weight loss so she was eligible for a knee replacement. We started her on ozempic at that time.  Today, the patient states that she does not feel well. She is currently suffering from bilateral LE weeping, pain, and pruritis. Her pain can be severe enough to wake her up from sleep. No bleeding from her LE. Her LE swelling has been going on for a time but is worsening. She has tried to resolve her swelling with treatments including an OTC antibiotic ointment. She has been compliant with her diuretic, 40 mg  Lasix daily but the fluid has been worsening. Notably, she has gained 7lbs since 07/04/22. She has noticed mild worsening dyspnea on exertion over the past couple of weeks as well as one episode of tightness in her chest when laying down. She is using her CPAP at night and chronically sleeps on incline.  Of note, she has been working on her diet to lose weight but has not been monitoring her sodium too closely. She has been having sliced low fat deli meat and admits to eating potato chips last night. She was unaware that salt can make her retain fluid.   Her blood pressure is elevated in clinic today. She did take her antihypertensives this morning. Prior to onset of her current symptoms her blood pressures were averaging 785Y-850Y systolic.   Otherwise, she denies any palpitations, lightheadedness, headaches, or syncope.   Past Medical History:  Diagnosis Date   Arthritis    left knee,right hip   Back pain    Cardiac arrest (HCC)    CHF (congestive heart failure) (HCC)    Constipation    Depression    Diabetes mellitus    GERD (gastroesophageal reflux disease)    Glaucoma    Glaucoma    H/O cardiac arrest 11/2011   PEA   Heart murmur    Hyperlipidemia    Hypertension    Joint pain    Myocardial infarction [facility]) 2013   Palpitations    Sleep apnea    Bring machine,mask and tubing  Swelling     Past Surgical History:  Procedure Laterality Date   BACK SURGERY     COLONOSCOPY N/A 09/12/2016   Procedure: COLONOSCOPY;  Surgeon: Clarene Essex, MD;  Location: [facility];  Service: Endoscopy;  Laterality: N/A;   CYSTOSCOPY W/ URETERAL STENT PLACEMENT Left 01/04/2013   Procedure: CYSTOSCOPY WITH RETROGRADE PYELOGRAM/URETERAL STENT PLACEMENT;  Surgeon: Bernestine Amass, MD;  Location: [facility];  Service: Urology;  Laterality: Left;   CYSTOSCOPY WITH RETROGRADE PYELOGRAM, URETEROSCOPY AND STENT PLACEMENT Left 01/26/2013   Procedure: CYSTOSCOPY, JJ STENT REMOVAL, LEFT URETEROSCOPY, ;  Surgeon:  Bernestine Amass, MD;  Location: [facility];  Service: Urology;  Laterality: Left;  CYSTO, JJ STENT REMOVAL, LEFT URETEROSCOPY    FOOT SURGERY     HERNIA REPAIR     HOLMIUM LASER APPLICATION Left 1/76/1607   Procedure: HOLMIUM LASER LITHOTRIPSY ;  Surgeon: Bernestine Amass, MD;  Location: [facility];  Service: Urology;  Laterality: Left;   KNEE SURGERY     left   PARTIAL HYSTERECTOMY      Current Medications: Current Meds  Medication Sig   amLODipine (NORVASC) 5 MG tablet Take 1 tablet (5 mg total) by mouth daily.   aspirin EC 81 MG tablet Take 1 tablet (81 mg total) by mouth daily. Swallow whole.   atorvastatin (LIPITOR) 40 MG tablet Take 1 tablet (40 mg total) by mouth daily.   BD INSULIN SYRINGE U/F 31G X 5/16" 0.5 ML MISC as directed.    blood glucose meter kit and supplies KIT Dispense based on patient and insurance preference. Use up to four times daily as directed. (FOR ICD-9 250.00, 250.01).   cephALEXin (KEFLEX) 500 MG capsule Take 1 capsule (500 mg total) by mouth 2 (two) times daily. Take for 10 days only.   Chlorphen-Phenyleph-APAP (CORICIDIN D COLD/FLU/SINUS) 2-5-325 MG TABS Take 1 tablet by mouth every 6 (six) hours as needed (for congestion or cold-like symptoms).    clobetasol ointment (TEMOVATE) 3.71 % Apply 1 application topically 2 (two) times daily as needed (as directed- avoid open wounds).    furosemide (LASIX) 40 MG tablet Take 1 tablet (40 mg total) by mouth twice daily for 5 days only, then decrease to taking 1 tablet (40 mg total) by mouth daily thereafter.   gabapentin (NEURONTIN) 300 MG capsule Take 300 mg by mouth See admin instructions. Take 300 mg by mouth two times a day and an additional 300 mg once daily as needed for nerve pain   Lancets (ONETOUCH DELICA PLUS [id number]) MISC USE TO TEST YOUR BLOOD SUGAR 2 TIMES A DAY   latanoprost (XALATAN) 0.005 % ophthalmic solution 1 drop at bedtime.   lisinopril (ZESTRIL) 40 MG tablet Take 1 tablet (40 mg total) by mouth daily.    loratadine (CLARITIN) 10 MG tablet Take 10 mg by mouth daily.   metoprolol succinate (TOPROL-XL) 100 MG 24 hr tablet Take 1 tablet (100 mg total) by mouth daily before breakfast. Take with or immediately following a meal.   mometasone (ELOCON) 0.1 % ointment See admin instructions.   NOVOLIN N 100 UNIT/ML injection Inject 35 Units into the skin 2 (two) times daily after a meal. 35 units in the morning and 15 units at night time.   olopatadine (PATANOL) 0.1 % ophthalmic solution Place 1 drop into both eyes 2 (two) times daily.   ONETOUCH ULTRA test strip 2 (two) times daily.   oxybutynin (DITROPAN) 5 MG tablet Take 1 tablet (5 mg total) by mouth daily.  pantoprazole (PROTONIX) 40 MG tablet Take 1 tablet (40 mg total) by mouth daily.   potassium chloride SA (KLOR-CON M) 20 MEQ tablet Take 1 tablet (20 mEq total) by mouth twice daily for 5 days only, then stop. Take this with increased dose of lasix.   spironolactone (ALDACTONE) 25 MG tablet Take 1 tablet (25 mg total) by mouth daily.   traMADol (ULTRAM) 50 MG tablet Take 2 tablets (100 mg total) by mouth every 8 (eight) hours as needed for severe pain.   Vitamin D, Ergocalciferol, (DRISDOL) 1.25 MG (50000 UNIT) CAPS capsule Take 1 capsule (50,000 Units total) by mouth every 7 (seven) days.   [DISCONTINUED] furosemide (LASIX) 40 MG tablet Take 1 tablet (40 mg total) by mouth daily.     Allergies:   Contrast media [iodinated contrast media], Empagliflozin, and Metformin   Social History   Socioeconomic History   Marital status: Divorced    Spouse name: Not on file   Number of children: Not on file   Years of education: Not on file   Highest education level: Not on file  Occupational History   Occupation: Retired  Tobacco Use   Smoking status: Never   Smokeless tobacco: Never  Vaping Use   Vaping Use: Never used  Substance and Sexual Activity   Alcohol use: No   Drug use: No   Sexual activity: Never  Other Topics Concern   Not on  file  Social History Narrative   Not on file   Social Determinants of Health   Financial Resource Strain: Not on file  Food Insecurity: No Food Insecurity (07/03/2019)   Hunger Vital Sign    Worried About Running Out of Food in the Last Year: Never true    Ran Out of Food in the Last Year: Never true  Transportation Needs: No Transportation Needs (07/03/2019)   PRAPARE - Hydrologist (Medical): No    Lack of Transportation (Non-Medical): No  Physical Activity: Not on file  Stress: Not on file  Social Connections: Not on file     Family History: The patient's family history includes Anxiety disorder in her father; Asthma in her mother; Depression in her father; Diabetes in her father and mother; Heart disease in her father and mother; Hyperlipidemia in her father and mother; Hypertension in her father and mother; Kidney disease in her mother; Obesity in her mother.  ROS:   Please see the history of present illness.    Review of Systems  Constitutional:  Positive for malaise/fatigue. Negative for chills and fever.  HENT:  Negative for hearing loss and sore throat.   Eyes:  Negative for blurred vision and redness.  Respiratory:  Positive for shortness of breath.   Cardiovascular:  Positive for chest pain, orthopnea and leg swelling (Bilateral weeping edema). Negative for palpitations, claudication and PND.  Gastrointestinal:  Positive for diarrhea. Negative for melena, nausea and vomiting.  Genitourinary:  Negative for dysuria and flank pain.  Musculoskeletal:  Positive for joint pain (L knee) and myalgias.  Skin:  Positive for itching.  Neurological:  Negative for dizziness and loss of consciousness.  Endo/Heme/Allergies:  Negative for environmental allergies.  Psychiatric/Behavioral:  Negative for substance abuse.     EKGs/Labs/Other Studies Reviewed:    The following studies were reviewed today:  Myoview 07/2021:   The study is normal. The study  is low risk.   No ST deviation was noted.   LV perfusion is normal.   Left ventricular  function is normal. Nuclear stress EF: 61 %. The left ventricular ejection fraction is normal (55-65%).   Prior study available for comparison from 12/27/2014. There are changes compared to prior study which appear to be resolved.  TTE 11/2021: IMPRESSIONS   1. Left ventricular ejection fraction, by estimation, is 50 to 55%. The  left ventricle has low normal function. The left ventricle has no regional  wall motion abnormalities. There is mild left ventricular hypertrophy.  Left ventricular diastolic  parameters are consistent with Grade I diastolic dysfunction (impaired  relaxation).   2. Right ventricular systolic function is normal. The right ventricular  size is normal. Tricuspid regurgitation signal is inadequate for assessing  PA pressure.   3. Left atrial size was mildly dilated.   4. The mitral valve is grossly normal. Trivial mitral valve  regurgitation.   5. The aortic valve was not well visualized. Aortic valve regurgitation  is not visualized. Mild aortic valve stenosis.   6. The inferior vena cava is normal in size with <50% respiratory  variability, suggesting right atrial pressure of 8 mmHg.   Comparison(s): No prior Echocardiogram.   TTE 07/2021: IMPRESSIONS   1. Mild apical hypokinesis. . Left ventricular ejection fraction, by  estimation, is 55 to 60%. The left ventricle has normal function. The left  ventricular internal cavity size was mildly dilated. There is mild left  ventricular hypertrophy. Left  ventricular diastolic parameters are indeterminate.   2. Right ventricular systolic function is normal. The right ventricular  size is normal.   3. The mitral valve is normal in structure. Mild mitral valve  regurgitation.   4. AV is trileaflet Appears to open well. Peak and mean gradients through  the LV/AV/aorta are 25 and 14 mm Hg respectively Turbulence appears to  start  above valve (see aorta) Gradients not signficantly changed from  previous echo . The aortic valve is  tricuspid. Aortic valve regurgitation is trivial. Mild aortic valve  sclerosis is present, with no evidence of aortic valve stenosis.   5. There is a thickening/brightness supravalvularly at sinotublar  junction. Question mild ridge. Turbulence seems to start at this point.   6. The inferior vena cava is dilated in size with <50% respiratory  variability, suggesting right atrial pressure of 15 mmHg.   TTE 06/06/20: 1. Left ventricular ejection fraction, by estimation, is 50%. The left  ventricle has mildly decreased function. The left ventricle demonstrates  global hypokinesis. There is mild left ventricular hypertrophy. Left  ventricular diastolic parameters are  consistent with Grade I diastolic dysfunction (impaired relaxation).   2. Right ventricular systolic function is normal. The right ventricular  size is normal. Tricuspid regurgitation signal is inadequate for assessing  PA pressure.   3. Left atrial size was mildly dilated.   4. The mitral valve is normal in structure. No evidence of mitral valve  regurgitation. No evidence of mitral stenosis.   5. The aortic valve is tricuspid. Aortic valve regurgitation is not  visualized. Mild aortic valve stenosis. Aortic valve mean gradient  measures 9.0 mmHg (gradient not significantly elevated but visually at  least mild AS).   6. The inferior vena cava is dilated in size with >50% respiratory  variability, suggesting right atrial pressure of 8 mmHg.  TTE 06/26/19: 1. Left ventricular ejection fraction, by visual estimation, is 55 to  60%. The left ventricle has normal function. Moderately increased left  ventricular size. There is no left ventricular hypertrophy.   2. Definity contrast agent was given  IV to delineate the left ventricular  endocardial borders.   3. Elevated mean left atrial pressure.   4. Left ventricular diastolic  Doppler parameters are consistent with  impaired relaxation pattern of LV diastolic filling.   5. Global right ventricle has normal systolic function.The right  ventricular size is normal. No increase in right ventricular wall  thickness.   6. The mitral valve is normal in structure. Mild mitral valve  regurgitation.   Nuclear stress 12/26/2015: Nuclear stress EF: 60%. There was no ST segment deviation noted during stress. Defect 1: There is a small defect of moderate severity present in the basal inferolateral and mid inferolateral location. Findings consistent with prior myocardial infarction. This is a low risk study. The left ventricular ejection fraction is normal (55-65%).   EKG:  EKG is personally reviewed. 07/18/2022:  EKG was not ordered. 11/30/2021 (ED):  Sinus bradycardia at 54 bpm, Left ventricular hypertrophy with repolarization abnormality ( R in aVL , Cornell product , Romhilt-Estes ) Abnormal ECG When compared to prior, new t wave inversionin lead V6. No STEMI   Recent Labs: 11/30/2021: B Natriuretic Peptide 103.7; Hemoglobin 11.8; Platelets 251 12/22/2021: BUN 22; Creatinine, Ser 0.81; Potassium 4.0; Sodium 140   Recent Lipid Panel    Component Value Date/Time   CHOL 184 03/29/2021 0000   TRIG 127 03/29/2021 0000   HDL 52 03/29/2021 0000   LDLCALC 109 03/29/2021 0000     Physical Exam:    VS:  BP 136/87 (BP Location: Left Arm, Patient Position: Sitting, Cuff Size: Large)   Pulse 78   Ht '5\' 2"'  (1.575 m)   Wt 293 lb 9.6 oz (133.2 kg)   SpO2 97%   BMI 53.70 kg/m     Wt Readings from Last 3 Encounters:  07/18/22 293 lb 9.6 oz (133.2 kg)  07/04/22 286 lb (129.7 kg)  04/17/22 278 lb (126.1 kg)     GEN:  NAD HEENT: Normal NECK: No JVD; No carotid bruits CARDIAC: RRR, 2-4/2 harsh systolic murmur, no rubs or gallops RESPIRATORY:  Clear to auscultation without rales, wheezing or rhonchi  ABDOMEN: Soft, non-tender, non-distended MUSCULOSKELETAL:  + weeping LE  edema R>L and warm to palpation; No deformity  SKIN: Warm and dry; Soft tissue mass of right forearm NEUROLOGIC:  Alert and oriented x 3 PSYCHIATRIC:  Normal affect   ASSESSMENT:    1. Acute on chronic systolic (congestive) heart failure ([place])   2. Medication management   3. Forearm mass, right   4. HFrEF (heart failure with reduced ejection fraction) ([place])   5. Type 2 diabetes mellitus with complication, with long-term current use of insulin ([place])   6. Bilateral lower extremity edema   7. Cellulitis, unspecified cellulitis site   8. Essential hypertension   9. Morbid obesity ([place])   10. Hyperlipidemia LDL goal <70   11. OSA on CPAP     PLAN:    In order of problems listed above:  #Acute on Chronic systolic heart failure with Improved EF: EF originally 30-35% in the setting of a respiratory/cardiac arrest that improved to 50-55% on most recent TTE 11/2021. Currently overloaded on exam with bilateral weeping LE edema and 7lbs weight gain over the past 2 weeks. Likely due to dietary indiscretion with increased Na intake. Will increase lasix as below. Discussed low Na diet at length. -Increase lasix to 53m PO BID x5 days and then daily thereafter -Start K+ 275m BID x5 days; does not require it with maintenance dosing -Continue metoprolol  171m XL -Continue lisinopril 441mdaily -Continue spironolactone 2566maily -Low Na diet discussed at length today  #RLE cellulitis: Developed in the setting of LE edema as above. -Start keflex 500m71mD x 10 days  #Chest Pain: #DOE:  Likely worsened in the setting of acute volume overload as above. Myoview 07/2021 with normal perfusion.  -Manage volume overload as above -Nuc 2022 normal -Cath 2006 with nonobstructive CAD -Symptoms non-exertional and improve with movement -Will continue to monitor and manage HF as below -Encourage weight loss as this may be contributing to symptoms   #Mild AS: TTE 11/2021 with mild AS. Will continue  with serial monitoring with repeat TTE in 2 years.  -Repeat TTE 11/2023 unless clinical change  #Right Forearm Mass: Incidentally noted. Will check ultrasound for further evaluation. -Check ultrasound   #Diabetes Mellitus on Insulin A1C 7.7. -Management per PCP   #Morbid Obesity BMI 53. Continuing to try to lose weight -Continue weight loss efforts -Discussed healthy diet and encouraged continued walking with her walker   #Hypertension Better controlled at home. -Diuresis as above -Continue amlodipine 5mg 33mly -Continue metoprolol 100mg 56mContinue lisinopril 40mg d53m -Continue spironolactone 25mg da64m #Hyperlipidemia -Continue lipitor 40mg dai5m-LDL 90 -Management per PCP  #Obstructive sleep apnea -Continue CPAP  Follow-up:  2 weeks.  Medication Adjustments/Labs and Tests Ordered: Current medicines are reviewed at length with the patient today.  Concerns regarding medicines are outlined above.   Orders Placed This Encounter  Procedures   US SOFT TK[place]SUE RT UPPER EXTREMITY LTD (NON-VASCULAR)   Basic metabolic panel   Pro b natriuretic peptide   Basic metabolic panel   Meds ordered this encounter  Medications   furosemide (LASIX) 40 MG tablet    Sig: Take 1 tablet (40 mg total) by mouth twice daily for 5 days only, then decrease to taking 1 tablet (40 mg total) by mouth daily thereafter.    Dispense:  42 tablet    Refill:  0    Dose increase   potassium chloride SA (KLOR-CON M) 20 MEQ tablet    Sig: Take 1 tablet (20 mEq total) by mouth twice daily for 5 days only, then stop. Take this with increased dose of lasix.    Dispense:  10 tablet    Refill:  0    Take with 5 day increased BID lasix dosing.   cephALEXin (KEFLEX) 500 MG capsule    Sig: Take 1 capsule (500 mg total) by mouth 2 (two) times daily. Take for 10 days only.    Dispense:  20 capsule    Refill:  0   Patient Instructions  Medication Instructions:   INCREASE YOUR FUROSEMIDE (LASIX) TO  40 MG BY MOUTH TWICE DAILY FOR 5 DAYS ONLY, THEN DECREASE TO TAKING 40 MG BY MOUTH DAILY THEREAFTER.   START TAKING POTASSIUM CHLORIDE 20 mEq BY MOUTH TWICE DAILY FOR 5 DAYS ONLY THEN STOP--TAKE WITH INCREASED LASIX DOSE  START TAKING KEFLEX 500 MG BY MOUTH TWICE DAILY FOR 10 DAYS ONLY  *If you need a refill on your cardiac medications before your next appointment, please call your pharmacy*   Lab Work:  1.)  TODAY--BMET AND PRO-BNP  2.)  IN ONE WEEK (7 DAYS) HERE IN THE OFFICE--CHECK BMET  If you have labs (blood work) drawn today and your tests are completely normal, you will receive your results only by: MyChart [email or web address] MyChart) OR [name] in the mail If you have any lab test  that is abnormal or we need to change your treatment, we will call you to review the results.   Testing/Procedures:  SOFT TISSUE US TO RIGHT UPPER EXTREMITY (RIGHT FOREARM) --ULTRASOUND TO ASSESS RIGHT FOREARM MASS    Follow-Up:  2 WEEKS WITH AN EXTENDER IN THE OFFICE   Important Information About Sugar        I,Mathew Stumpf,acting as a scribe for [name], MD.,have documented all relevant documentation on the behalf of [name], MD,as directed by  [name], MD while in the presence of [name], MD.  I, [name], MD, have reviewed all documentation for this visit. The documentation on 07/18/22 for the exam, diagnosis, procedures, and orders are all accurate and complete.   Signed, [name], MD  07/18/2022 12:43 PM    [place]

## 2022-07-18 NOTE — Patient Instructions (Signed)
Medication Instructions:   INCREASE YOUR FUROSEMIDE (LASIX) TO 40 MG BY MOUTH TWICE DAILY FOR 5 DAYS ONLY, THEN DECREASE TO TAKING 40 MG BY MOUTH DAILY THEREAFTER.   START TAKING POTASSIUM CHLORIDE 20 mEq BY MOUTH TWICE DAILY FOR 5 DAYS ONLY THEN STOP--TAKE WITH INCREASED LASIX DOSE  START TAKING KEFLEX 500 MG BY MOUTH TWICE DAILY FOR 10 DAYS ONLY  *If you need a refill on your cardiac medications before your next appointment, please call your pharmacy*   Lab Work:  1.)  TODAY--BMET AND PRO-BNP  2.)  IN ONE WEEK (7 DAYS) HERE IN THE OFFICE--CHECK BMET  If you have labs (blood work) drawn today and your tests are completely normal, you will receive your results only by: [place] (if you have MyChart) OR [name] in the mail If you have any lab test that is abnormal or we need to change your treatment, we will call you to review the results.   Testing/Procedures:  SOFT TISSUE US TO RIGHT UPPER EXTREMITY (RIGHT FOREARM) --ULTRASOUND TO ASSESS RIGHT FOREARM MASS    Follow-Up:  2 WEEKS WITH AN EXTENDER IN THE OFFICE   Important Information About Sugar

## 2022-07-20 ENCOUNTER — Telehealth: Payer: Self-pay | Admitting: *Deleted

## 2022-07-20 DIAGNOSIS — R600 Localized edema: Secondary | ICD-10-CM

## 2022-07-20 DIAGNOSIS — Z794 Long term (current) use of insulin: Secondary | ICD-10-CM

## 2022-07-20 DIAGNOSIS — I5020 Unspecified systolic (congestive) heart failure: Secondary | ICD-10-CM

## 2022-07-20 DIAGNOSIS — Z79899 Other long term (current) drug therapy: Secondary | ICD-10-CM

## 2022-07-20 NOTE — Telephone Encounter (Signed)
Nuala Alpha, LPN  40/07/2724  3:66 PM EDT Back to Top    Checked in with our Lab Tech about pending BNP.   Per Katrina in Lab, she said that [facility] advised to give the result until the morning time to come back, and if this is still not received, then it was due to incorrect amount of specimen drawn for this test, and pt would then have to come in for a redraw thereafter.    Will follow-up on BNP tomorrow morning, and if still not received, will have our lab techs reach out to the pt for redraw.  Dr. Johney Frame aware of this.    Nuala Alpha, LPN  44/11/4740  5:95 PM EDT     BNP still pending.  Preliminarily reviewed. Forwarded to MD desktop for review and signature.    Pts BNP result never came through. According to the lab, there was not enough specimen collected to run this test.   Per Dr. Johney Frame, being the pt will be coming in to have another BMET done on 10/26, we will just add the PRO-BNP to that lab appt, to avoid the pt being stuck several times.   New order for PRO-BNP placed, and the pt will have this done at her scheduled lab appt with our office on 10/26.  10/26 lab will check a BMET and PRO-BNP at that time.

## 2022-07-25 ENCOUNTER — Other Ambulatory Visit (HOSPITAL_COMMUNITY): Payer: HMO

## 2022-07-25 ENCOUNTER — Encounter (INDEPENDENT_AMBULATORY_CARE_PROVIDER_SITE_OTHER): Payer: Self-pay | Admitting: Family Medicine

## 2022-07-25 ENCOUNTER — Ambulatory Visit (INDEPENDENT_AMBULATORY_CARE_PROVIDER_SITE_OTHER): Payer: HMO | Admitting: Family Medicine

## 2022-07-25 VITALS — BP 127/72 | HR 78 | Temp 97.7°F | Ht 62.0 in | Wt 283.0 lb

## 2022-07-25 DIAGNOSIS — Z6841 Body Mass Index (BMI) 40.0 and over, adult: Secondary | ICD-10-CM | POA: Diagnosis not present

## 2022-07-25 DIAGNOSIS — Z794 Long term (current) use of insulin: Secondary | ICD-10-CM

## 2022-07-25 DIAGNOSIS — E559 Vitamin D deficiency, unspecified: Secondary | ICD-10-CM

## 2022-07-25 DIAGNOSIS — E669 Obesity, unspecified: Secondary | ICD-10-CM

## 2022-07-25 DIAGNOSIS — Z7985 Long-term (current) use of injectable non-insulin antidiabetic drugs: Secondary | ICD-10-CM

## 2022-07-25 DIAGNOSIS — E1169 Type 2 diabetes mellitus with other specified complication: Secondary | ICD-10-CM | POA: Diagnosis not present

## 2022-07-25 MED ORDER — VITAMIN D (ERGOCALCIFEROL) 1.25 MG (50000 UNIT) PO CAPS: 50000.0000 [IU] | ORAL_CAPSULE | ORAL | 0 refills | Status: DC

## 2022-07-26 ENCOUNTER — Ambulatory Visit (HOSPITAL_COMMUNITY)
Admission: RE | Admit: 2022-07-26 | Discharge: 2022-07-26 | Disposition: A | Discharge status: Home / Self Care | Payer: HMO | Source: Ambulatory Visit | Attending: Cardiology | Admitting: Cardiology

## 2022-07-26 ENCOUNTER — Ambulatory Visit: Payer: HMO

## 2022-07-26 DIAGNOSIS — I5020 Unspecified systolic (congestive) heart failure: Secondary | ICD-10-CM | POA: Insufficient documentation

## 2022-07-26 DIAGNOSIS — E118 Type 2 diabetes mellitus with unspecified complications: Secondary | ICD-10-CM | POA: Diagnosis present

## 2022-07-26 DIAGNOSIS — Z794 Long term (current) use of insulin: Secondary | ICD-10-CM

## 2022-07-26 DIAGNOSIS — I5023 Acute on chronic systolic (congestive) heart failure: Secondary | ICD-10-CM | POA: Diagnosis present

## 2022-07-26 DIAGNOSIS — Z79899 Other long term (current) drug therapy: Secondary | ICD-10-CM | POA: Insufficient documentation

## 2022-07-26 DIAGNOSIS — R2231 Localized swelling, mass and lump, right upper limb: Secondary | ICD-10-CM | POA: Insufficient documentation

## 2022-07-26 DIAGNOSIS — R600 Localized edema: Secondary | ICD-10-CM | POA: Insufficient documentation

## 2022-07-27 LAB — BASIC METABOLIC PANEL
BUN/Creatinine Ratio: 18 (ref 12–28)
BUN: 15 mg/dL (ref 8–27)
CO2: 27 mmol/L (ref 20–29)
Calcium: 9.7 mg/dL (ref 8.7–10.3)
Chloride: 103 mmol/L (ref 96–106)
Creatinine, Ser: 0.82 mg/dL (ref 0.57–1.00)
Glucose: 168 mg/dL — ABNORMAL HIGH (ref 70–99)
Potassium: 4.4 mmol/L (ref 3.5–5.2)
Sodium: 142 mmol/L (ref 134–144)
eGFR: 78 mL/min/{1.73_m2} (ref >59)

## 2022-07-27 LAB — PRO B NATRIURETIC PEPTIDE

## 2022-07-28 ENCOUNTER — Encounter: Payer: Self-pay | Admitting: Podiatry

## 2022-07-28 LAB — BASIC METABOLIC PANEL
BUN/Creatinine Ratio: 33 — ABNORMAL HIGH (ref 12–28)
BUN: 30 mg/dL — ABNORMAL HIGH (ref 8–27)
CO2: 24 mmol/L (ref 20–29)
Calcium: 9.9 mg/dL (ref 8.7–10.3)
Chloride: 101 mmol/L (ref 96–106)
Creatinine, Ser: 0.91 mg/dL (ref 0.57–1.00)
Glucose: 150 mg/dL — ABNORMAL HIGH (ref 70–99)
Potassium: 4.6 mmol/L (ref 3.5–5.2)
Sodium: 141 mmol/L (ref 134–144)
eGFR: 69 mL/min/{1.73_m2} (ref >59)

## 2022-07-28 LAB — PRO B NATRIURETIC PEPTIDE: NT-Pro BNP: 121 pg/mL (ref 0–301)

## 2022-07-30 ENCOUNTER — Telehealth: Payer: Self-pay | Admitting: *Deleted

## 2022-07-30 ENCOUNTER — Telehealth: Payer: Self-pay | Admitting: Cardiology

## 2022-07-30 ENCOUNTER — Telehealth: Payer: Self-pay | Admitting: Podiatry

## 2022-07-30 DIAGNOSIS — R2231 Localized swelling, mass and lump, right upper limb: Secondary | ICD-10-CM

## 2022-07-30 MED ORDER — FLUCONAZOLE 150 MG PO TABS: 150.0000 mg | ORAL_TABLET | Freq: Once | ORAL | 0 refills | Status: AC

## 2022-07-30 NOTE — Telephone Encounter (Signed)
Nuala Alpha, LPN  14/43/1540  0:86 PM EDT     The patient has been notified of the result and verbalized understanding.  All questions (if any) were answered.   Pt aware she will need an MRI OF THE RIGHT FOREARM W/WO CONTRAST, for noted findings on [place] of  5 x 2 cm complex fatty lesion in the right forearm.   Pt aware that I will place the order for this in the system an send a message to our [facility] Schedulers to call her back tomorrow, to arrange this appt.    Pt states she prefers having this done at [facility], for this is a short commute for her.  Will endorse this to scheduling dept.   Pt verbalized understanding and agrees with this plan.   [name], MD  07/30/2022  4:18 PM EDT     Her ultrasound of her arm shows a fatty lesion. They recommend MRI with and without contrast for further evaluation.

## 2022-07-30 NOTE — Telephone Encounter (Signed)
Pt returning nurses call regarding lab results. Please advise 

## 2022-07-30 NOTE — Telephone Encounter (Signed)
Pt called upset that she has called multiple times and left messages and no one has called her back. She only has one pair of shoes to wear and really needs these shoes. The process was started at the end of June early July. She was told the shoes were in but not the inserts a while back but has not heard anything else. I did apologize for the delay. She is upset and is thinking about calling medicare to file a complaint.

## 2022-07-30 NOTE — Telephone Encounter (Signed)
The patient has been notified of the result and verbalized understanding.  All questions (if any) were answered.  Pt aware she will need an MRI OF THE RIGHT FOREARM W/WO CONTRAST, for noted findings on [place] of  5 x 2 cm complex fatty lesion in the right forearm.  Pt aware that I will place the order for this in the system an send a message to our [facility] Schedulers to call her back tomorrow, to arrange this appt.   Pt states she prefers having this done at [facility], for this is a short commute for her.  Will endorse this to scheduling dept.  Pt verbalized understanding and agrees with this plan.

## 2022-07-30 NOTE — Telephone Encounter (Signed)
Sandhya, Denherder A - 07/30/2022  9:12 AM [name], MD  Sent: Mon July 30, 2022  3:49 PM  To: Nuala Alpha, LPN          Message  We can give her fluconazole '150mg'$  x1 dose.     Thank yoU!   Called the pt and endorsed to her that Dr. Johney Frame prescribed her a one time dose of diflucan 150 mg to take tonight.   She is aware that she will take 1 tablet (150 mg total) by mouth for one dose, for yeast infection secondary to taking antibiotics.   Pt aware that I sent this to her pharmacy on file, [facility].   She is aware that I will call her with her soft tissue US results, once Dr. Johney Frame has a chance to review this.  She is aware that this is in Dr. Jacolyn Reedy in basket to review and once she review and results, I will call her shortly thereafter.   Pt verbalized understanding and agrees with this plan.  Pt was more than gracious for all the assistance provided.

## 2022-07-30 NOTE — Telephone Encounter (Signed)
Pt c/o medication issue:  1. Name of Medication: cephALEXin (KEFLEX) 500 MG capsule  2. How are you currently taking this medication (dosage and times per day)? cephALEXin (KEFLEX) 500 MG capsule  3. Are you having a reaction (difficulty breathing--STAT)? Patient developed a yeast infection while taking medication  4. What is your medication issue? Need antibiotic for yeast infection [id number] [place],  - [address]

## 2022-07-30 NOTE — Progress Notes (Unsigned)
Chief Complaint:   OBESITY Whitney [name] is here to discuss her progress with her obesity treatment plan along with follow-up of her obesity related diagnoses. Ashawna is on {MWMwtlossportion/plan2:23431} and states she is following her eating plan approximately ***% of the time. [name] states she is *** *** minutes *** times per week.  Today's visit was #: *** Starting weight: *** Starting date: *** Today's weight: *** Today's date: 07/25/2022 Total lbs lost to date: *** Total lbs lost since last in-office visit: ***  Interim History: ***  Subjective:   1. Type 2 diabetes mellitus with other specified complication, with long-term current use of insulin (HCC) ***  2. Vitamin D deficiency ***  Assessment/Plan:   1. Type 2 diabetes mellitus with other specified complication, with long-term current use of insulin (HCC) ***  2. Vitamin D deficiency *** - Vitamin D, Ergocalciferol, (DRISDOL) 1.25 MG (50000 UNIT) CAPS capsule; Take 1 capsule (50,000 Units total) by mouth every 7 (seven) days.  Dispense: 4 capsule; Refill: 0  3. Obesity with current BMI of 51.9 Via is currently in the action stage of change. As such, her goal is to continue with weight loss efforts. She has agreed to the Category 2 Plan.   [name] was educated about diet and supplements, and marketing. She will continue to follow her Category 2 plan.   Behavioral modification strategies: increasing lean protein intake, no skipping meals, and meal planning and cooking strategies.  [name] has agreed to follow-up with our clinic in 3 to 4 weeks. She was informed of the importance of frequent follow-up visits to maximize her success with intensive lifestyle modifications for her multiple health conditions.   Objective:   Blood pressure 127/72, pulse 78, temperature 97.7 F (36.5 C), height '5\' 2"'$  (1.575 m), weight 283 lb (128.4 kg), SpO2 97 %. Body mass index is 51.76 kg/m.  General: Cooperative, alert, well  developed, in no acute distress. HEENT: Conjunctivae and lids unremarkable. Cardiovascular: Regular rhythm.  Lungs: Normal work of breathing. Neurologic: No focal deficits.   Lab Results  Component Value Date   CREATININE 0.91 07/26/2022   BUN 30 (H) 07/26/2022   NA 141 07/26/2022   K 4.6 07/26/2022   CL 101 07/26/2022   CO2 24 07/26/2022   Lab Results  Component Value Date   ALT 11 06/26/2019   AST 11 (A) 03/29/2021   ALKPHOS 91 06/26/2019   BILITOT 1.2 06/26/2019   Lab Results  Component Value Date   HGBA1C 6.4 (H) 09/06/2021   HGBA1C 9.4 03/29/2021   HGBA1C 7.5 (H) 06/26/2019   HGBA1C 7.6 (H) 07/12/2017   HGBA1C 7.6 (H) 11/16/2011   No results found for: "INSULIN" Lab Results  Component Value Date   TSH 3.348 11/16/2011   Lab Results  Component Value Date   CHOL 184 03/29/2021   HDL 52 03/29/2021   LDLCALC 109 03/29/2021   TRIG 127 03/29/2021   Lab Results  Component Value Date   VD25OH 49.4 02/15/2022   VD25OH 39.6 09/06/2021   VD25OH 9.6 (L) 04/06/2021   Lab Results  Component Value Date   WBC 8.4 11/30/2021   HGB 11.8 (L) 11/30/2021   HCT 37.6 11/30/2021   MCV 86.6 11/30/2021   PLT 251 11/30/2021   Lab Results  Component Value Date   IRON 68 11/16/2011   TIBC 302 11/16/2011   FERRITIN 253 11/16/2011   Attestation Statements:   Reviewed by clinician on day of visit: allergies, medications, problem list, medical history,  surgical history, family history, social history, and previous encounter notes.  Time spent on visit including pre-visit chart review and post-visit care and charting was 40 minutes.   I, Trixie Dredge, am acting as transcriptionist for Dennard Nip, MD.  I have reviewed the above documentation for accuracy and completeness, and I agree with the above. -  ***

## 2022-07-30 NOTE — Telephone Encounter (Signed)
Paperwork was faxed back over to treating office for signature

## 2022-07-30 NOTE — Telephone Encounter (Signed)
-----   Message from [name], MD sent at 07/30/2022  4:18 PM EDT ----- Her ultrasound of her arm shows a fatty lesion. They recommend MRI with and without contrast for further evaluation.

## 2022-07-30 NOTE — Telephone Encounter (Signed)
Pt aware of lab results and stated swelling is better Pt also was asking about ultrasound of arm results Pt aware exam is final but no report is in system Will call once test has been read .Kidney function and electrolytes look great. Fluid levels look normal. How is her swelling?

## 2022-07-30 NOTE — Telephone Encounter (Signed)
Whitney Campbell, LPN      81/15/72  6:20 AM Note Pt aware of lab results and stated swelling is better Pt also was asking about ultrasound of arm results Pt aware exam is final but no report is in system Will call once test has been read .Kidney function and electrolytes look great. Fluid levels look normal. How is her swelling?

## 2022-07-30 NOTE — Telephone Encounter (Signed)
Will forward this message to Dr. Johney Frame for further advisement on this matter.   We will follow-up with the pt accordingly thereafter.

## 2022-07-30 NOTE — Telephone Encounter (Signed)
Spoke with pt about diabetic shoe order. Explained that while waiting on the shoes and inserts to come in the paperwork expired.

## 2022-08-01 ENCOUNTER — Ambulatory Visit: Payer: HMO | Admitting: Nurse Practitioner

## 2022-08-01 ENCOUNTER — Ambulatory Visit: Payer: HMO | Attending: Nurse Practitioner | Admitting: Physician Assistant

## 2022-08-01 ENCOUNTER — Encounter: Payer: Self-pay | Admitting: Physician Assistant

## 2022-08-01 VITALS — BP 126/72 | HR 71 | Ht 62.0 in | Wt 287.2 lb

## 2022-08-01 DIAGNOSIS — G4733 Obstructive sleep apnea (adult) (pediatric): Secondary | ICD-10-CM

## 2022-08-01 DIAGNOSIS — I10 Essential (primary) hypertension: Secondary | ICD-10-CM

## 2022-08-01 DIAGNOSIS — E785 Hyperlipidemia, unspecified: Secondary | ICD-10-CM | POA: Diagnosis not present

## 2022-08-01 DIAGNOSIS — I5023 Acute on chronic systolic (congestive) heart failure: Secondary | ICD-10-CM | POA: Diagnosis not present

## 2022-08-01 DIAGNOSIS — I350 Nonrheumatic aortic (valve) stenosis: Secondary | ICD-10-CM

## 2022-08-01 NOTE — Patient Instructions (Signed)
Medication Instructions:  Your physician recommends that you continue on your current medications as directed. Please refer to the Current Medication list given to you today. *If you need a refill on your cardiac medications before your next appointment, please call your pharmacy*   Lab Work: None ordered   Testing/Procedures: Schedule Mr of the Forearm  Contact radiology at [phone]   Follow-Up: At [facility], you and your health needs are our priority.  As part of our continuing mission to provide you with exceptional heart care, we have created designated Provider Care Teams.  These Care Teams include your primary Cardiologist (physician) and Advanced Practice Providers (APPs -  Physician Assistants and Nurse Practitioners) who all work together to provide you with the care you need, when you need it.  We recommend signing up for the patient portal called "MyChart".  Sign up information is provided on this After Visit Summary.  MyChart is used to connect with patients for Virtual Visits (Telemedicine).  Patients are able to view lab/test results, encounter notes, upcoming appointments, etc.  Non-urgent messages can be sent to your provider as well.   To learn more about what you can do with MyChart, go to [email or web address].    Your next appointment:   3 month(s)  The format for your next appointment:   In Person  Provider:   [name], MD    PLEASE SCHEDULE FOLLOW UP WITH DR [name] FIRST AVAILABLE    Other Instructions For patients with congestive heart failure, we give them these special instructions:  1. Follow a low-salt diet - you should be eating no more than 2,'000mg'$  of sodium per day. This does not necessarily just apply to the salt you put on top of prepared food, but the sodium already in food. Processed food, frozen meals, canned goods, deli meat, and bread can have a surprising amount of sodium per serving so be sure to track this  daily. 2. Watch your fluid intake. In general, you should not be taking in more than 2 liters of fluid per day (close to 64 oz of fluid per day). This includes sources of water in foods like soup, coffee, tea, milk, etc. It's important to stay hydrated but NOT to excess. 2. Weigh yourself on the same scale at same time of day and keep a log. 3. Call your doctor: (Anytime you feel any of the following symptoms)  - 3lb weight gain overnight or 5lb within a few days - Shortness of breath, with or without a dry hacking cough  - Swelling in the hands, feet or stomach  - If you have to sleep on extra pillows at night in order to breathe   IT IS IMPORTANT TO LET YOUR DOCTOR KNOW EARLY ON IF YOU ARE HAVING SYMPTOMS SO WE CAN HELP YOU!    Important Information About Sugar

## 2022-08-01 NOTE — Progress Notes (Signed)
Cardiology Office Note    Date:  08/01/2022   ID:  Whitney [name], DOB [birth year], MRN [id number]  PCP:  [name], MD  Cardiologist:  [name], MD  Electrophysiologist:  None   Chief Complaint: f/u CHF  History of Present Illness:   Whitney [name] is a 66 y.o. female with history of chronic HFrEF with subsequent improvement in LVEF, mild AS, no significant CAD on cath 2006, HTN, HLD with difficulty with higher dose statins, IDDM, OSA on CPAP, morbid obesity, arthritis, GERD who presents for f/u of CHF. She had a remote cath in 2006 showing no significant CAD. In 2013 she had PEA in setting of respiratory arrest with EF 30-35% at that time. F/u echocardiogram demonstrated improvement in LVEF therefore repeat cath not pursued. She has a history of atypical chest pain since that time. Last nuc 07/2021 was normal. Last echo 11/2021 showed EF 50-55%, no RWMA, G1DD, mild LVH, mild LAE, mild AS. She did not previously tolerate Jardiance due to yeast infections and GLP-1 was too expensive. [facility]'s [facility] She was recently seen in clinic 07/18/22 with worsening LE edema, weeping, pain, and pruiritis in the setting of excess sodium intake from deli meat, potato chips.  She was found to have volume overload and Lasix increased to 23m BIDx5 days then daily thereafter, as well as Keflex 5052mBID x 10 days for RLE cellulitis. She was also noted to have abnormality on her right arm and ultrasound showed a complex fatty lesion there, so Dr. PeJohney Framerdered MRI which is pending.  She is seen for follow-up today overall reporting she is doing much better. She states her itching/pain in her legs resolved and her edema is much better. There is still some swelling present but it is back to baseline. She has not yet scheduled the MRI because she is nervous about having this and has questions about how it will be done (I.e. positioning, pillow  availability,  She denies any chest pain, orthopnea, dizziness or syncope. She does report she's had more recent bloodwork for her cholesterol by [facility] than what we have available.  Labwork independently reviewed: 07/26/22 BNP wnl, K 4.6, Cr 0.91, BUN 30 11/2021 Hgb 11.8, plt ok   Cardiology Studies:   Studies reviewed are outlined and summarized above. Reports included below if pertinent.   2D echo 12/22/21    1. Left ventricular ejection fraction, by estimation, is 50 to 55%. The  left ventricle has low normal function. The left ventricle has no regional  wall motion abnormalities. There is mild left ventricular hypertrophy.  Left ventricular diastolic  parameters are consistent with Grade I diastolic dysfunction (impaired  relaxation).   2. Right ventricular systolic function is normal. The right ventricular  size is normal. Tricuspid regurgitation signal is inadequate for assessing  PA pressure.   3. Left atrial size was mildly dilated.   4. The mitral valve is grossly normal. Trivial mitral valve  regurgitation.   5. The aortic valve was not well visualized. Aortic valve regurgitation  is not visualized. Mild aortic valve stenosis.   6. The inferior vena cava is normal in size with <50% respiratory  variability, suggesting right atrial pressure of 8 mmHg.   Comparison(s): No prior Echocardiogram.   Nuc 07/2021 The study is normal. The study is low risk.   No ST deviation was noted.   LV perfusion is normal.   Left ventricular function is normal.  Nuclear stress EF: 61 %. The left ventricular ejection fraction is normal (55-65%).   Prior study available for comparison from 12/27/2014. There are changes compared to prior study which appear to be resolved.    Past Medical History:  Diagnosis Date   Arthritis    left knee,right hip   Back pain    Cardiac arrest (HCC)    CHF (congestive heart failure) (HCC)    Constipation    Depression    Diabetes mellitus    GERD  (gastroesophageal reflux disease)    Glaucoma    Glaucoma    H/O cardiac arrest 11/2011   PEA   Heart murmur    Hyperlipidemia    Hypertension    Joint pain    Myocardial infarction [facility]) 2013   Palpitations    Sleep apnea    Bring machine,mask and tubing   Swelling     Past Surgical History:  Procedure Laterality Date   BACK SURGERY     COLONOSCOPY N/A 09/12/2016   Procedure: COLONOSCOPY;  Surgeon: Clarene Essex, MD;  Location: [facility];  Service: Endoscopy;  Laterality: N/A;   CYSTOSCOPY W/ URETERAL STENT PLACEMENT Left 01/04/2013   Procedure: CYSTOSCOPY WITH RETROGRADE PYELOGRAM/URETERAL STENT PLACEMENT;  Surgeon: Bernestine Amass, MD;  Location: [facility];  Service: Urology;  Laterality: Left;   CYSTOSCOPY WITH RETROGRADE PYELOGRAM, URETEROSCOPY AND STENT PLACEMENT Left 01/26/2013   Procedure: CYSTOSCOPY, JJ STENT REMOVAL, LEFT URETEROSCOPY, ;  Surgeon: Bernestine Amass, MD;  Location: [facility];  Service: Urology;  Laterality: Left;  CYSTO, JJ STENT REMOVAL, LEFT URETEROSCOPY    FOOT SURGERY     HERNIA REPAIR     HOLMIUM LASER APPLICATION Left 0/34/7425   Procedure: HOLMIUM LASER LITHOTRIPSY ;  Surgeon: Bernestine Amass, MD;  Location: [facility];  Service: Urology;  Laterality: Left;   KNEE SURGERY     left   PARTIAL HYSTERECTOMY      Current Medications: Current Meds  Medication Sig   amLODipine (NORVASC) 5 MG tablet Take 1 tablet (5 mg total) by mouth daily.   aspirin EC 81 MG tablet Take 1 tablet (81 mg total) by mouth daily. Swallow whole.   atorvastatin (LIPITOR) 40 MG tablet Take 1 tablet (40 mg total) by mouth daily.   BD INSULIN SYRINGE U/F 31G X 5/16" 0.5 ML MISC as directed.    benzonatate (TESSALON) 100 MG capsule Take 1 capsule (100 mg total) by mouth every 8 (eight) hours.   blood glucose meter kit and supplies KIT Dispense based on patient and insurance preference. Use up to four times daily as directed. (FOR ICD-9 250.00, 250.01).   Chlorphen-Phenyleph-APAP (CORICIDIN D  COLD/FLU/SINUS) 2-5-325 MG TABS Take 1 tablet by mouth every 6 (six) hours as needed (for congestion or cold-like symptoms).    furosemide (LASIX) 40 MG tablet Take 1 tablet (40 mg total) by mouth twice daily for 5 days only, then decrease to taking 1 tablet (40 mg total) by mouth daily thereafter.   gabapentin (NEURONTIN) 300 MG capsule Take 300 mg by mouth See admin instructions. Take 300 mg by mouth two times a day and an additional 300 mg once daily as needed for nerve pain   Lancets (ONETOUCH DELICA PLUS [id number]) MISC USE TO TEST YOUR BLOOD SUGAR 2 TIMES A DAY   lisinopril (ZESTRIL) 40 MG tablet Take 1 tablet (40 mg total) by mouth daily.   loratadine (CLARITIN) 10 MG tablet Take 10 mg by mouth daily.   metoprolol succinate (TOPROL-XL)  100 MG 24 hr tablet Take 1 tablet (100 mg total) by mouth daily before breakfast. Take with or immediately following a meal.   NOVOLIN N 100 UNIT/ML injection Inject 35 Units into the skin 2 (two) times daily after a meal. 35 units in the morning and 15 units at night time.   ONETOUCH ULTRA test strip 2 (two) times daily.   oxybutynin (DITROPAN) 5 MG tablet Take 1 tablet (5 mg total) by mouth daily.   pantoprazole (PROTONIX) 40 MG tablet Take 1 tablet (40 mg total) by mouth daily.   spironolactone (ALDACTONE) 25 MG tablet Take 1 tablet (25 mg total) by mouth daily.   traMADol (ULTRAM) 50 MG tablet Take 2 tablets (100 mg total) by mouth every 8 (eight) hours as needed for severe pain.   Vitamin D, Ergocalciferol, (DRISDOL) 1.25 MG (50000 UNIT) CAPS capsule Take 1 capsule (50,000 Units total) by mouth every 7 (seven) days.      Allergies:   Contrast media [iodinated contrast media], Empagliflozin, and Metformin   Social History   Socioeconomic History   Marital status: Divorced    Spouse name: Not on file   Number of children: Not on file   Years of education: Not on file   Highest education level: Not on file  Occupational History   Occupation:  Retired  Tobacco Use   Smoking status: Never   Smokeless tobacco: Never  Vaping Use   Vaping Use: Never used  Substance and Sexual Activity   Alcohol use: No   Drug use: No   Sexual activity: Never  Other Topics Concern   Not on file  Social History Narrative   Not on file   Social Determinants of Health   Financial Resource Strain: Not on file  Food Insecurity: No Food Insecurity (07/03/2019)   Hunger Vital Sign    Worried About Running Out of Food in the Last Year: Never true    Ran Out of Food in the Last Year: Never true  Transportation Needs: No Transportation Needs (07/03/2019)   PRAPARE - Hydrologist (Medical): No    Lack of Transportation (Non-Medical): No  Physical Activity: Not on file  Stress: Not on file  Social Connections: Not on file     Family History:  The patient's family history includes Anxiety disorder in her father; Asthma in her mother; Depression in her father; Diabetes in her father and mother; Heart disease in her father and mother; Hyperlipidemia in her father and mother; Hypertension in her father and mother; Kidney disease in her mother; Obesity in her mother.  ROS:   Please see the history of present illness.  All other systems are reviewed and otherwise negative.    EKG(s)/Additional Labs   EKG:  EKG is not ordered today  Recent Labs: 11/30/2021: B Natriuretic Peptide 103.7; Hemoglobin 11.8; Platelets 251 07/26/2022: BUN 30; Creatinine, Ser 0.91; NT-Pro BNP 121; Potassium 4.6; Sodium 141  Recent Lipid Panel    Component Value Date/Time   CHOL 184 03/29/2021 0000   TRIG 127 03/29/2021 0000   HDL 52 03/29/2021 0000   LDLCALC 109 03/29/2021 0000    PHYSICAL EXAM:    VS:  BP 126/72   Pulse 71   Ht _0  (1.575 m)   Wt 287 lb 3.2 oz (130.3 kg)   SpO2 96%   BMI 52.53 kg/m   BMI: Body mass index is 52.53 kg/m.  GEN: Well nourished, well developed obese female in no  acute distress HEENT: normocephalic,  atraumatic Neck: no JVD, carotid bruits, or masses Cardiac: RRR; 2/6 SEM RUSB, no rubs or gallops, chronic venous stasis hyperpigmentation with chronic skin thickening and 1+ LE edema, no weeping Respiratory:  clear to auscultation bilaterally, normal work of breathing GI: soft, nontender, nondistended, + BS MS: no atrophy. Soft tissue rounded mass at right elbow, pliable, no skin breakdown or evidence of infection Skin: warm and dry, no rash Neuro:  Alert and Oriented x 3, Strength and sensation are intact, follows commands Psych: euthymic mood, full affect  Wt Readings from Last 3 Encounters:  08/01/22 287 lb 3.2 oz (130.3 kg)  07/25/22 283 lb (128.4 kg)  07/18/22 293 lb 9.6 oz (133.2 kg)     ASSESSMENT & PLAN:   1. Acute on chronic HFrEF - suspect volume overload is combination of CHF as well as venous insufficiency/lymphedema from severe morbid obesity. She reports edema is back to baseline. She has significantly cut down her deli meat intake, only sporadic indiscretion. Will continue Lasix 48m once daily for now along with spironolactone. See #2 regarding potential future med changes if needed. Last labs 07/26/22 were stable in follow-up. She will notify for any accelerating symptoms at which time I would suggest repeating echocardiogram to ensure no interim change from last study.  2. Essential HTN - BP normal at this time. Continue amlodipine, Lasix, lisinopril, Toprol, and spironolactone. If edema continues to be a problem, consideration could be given to discontinuing amlodipine and either changing metoprolol to carvedilol or lisinopril to [place]  3. HLD - she reports this has been more recently managed by her PCP. Will ask CMA to get copy of records (specifically lipid panel and recent TSH) for our review.   4. OSA - overdue for follow-up with Dr. TRadford Pax have recommended she schedule OV.  5. Mild AS - last assessed by echo 11/2021 - consider repeat echo 3 years from original  study, sooner if symptoms recur.  6. Right forearm mass - encouraged patient to follow through with MRI study. I have asked our CMA to help look into the facility performing the study so that they can give her more information of what to expect the day of her visit. She will have the scheduling team contact the patient.    Disposition: F/u with Dr. PJohney Framein 3 months.   Medication Adjustments/Labs and Tests Ordered: Current medicines are reviewed at length with the patient today.  Concerns regarding medicines are outlined above. Medication changes, Labs and Tests ordered today are summarized above and listed in the Patient Instructions accessible in Encounters.   Signed, DCharlie Pitter PA-C  08/01/2022 3:24 PM    CCarlislePhone: [phone] Fax: [phone]

## 2022-08-13 ENCOUNTER — Ambulatory Visit (HOSPITAL_COMMUNITY)
Admission: RE | Admit: 2022-08-13 | Discharge: 2022-08-13 | Disposition: A | Discharge status: Home / Self Care | Payer: HMO | Source: Ambulatory Visit | Attending: Cardiology | Admitting: Cardiology

## 2022-08-13 ENCOUNTER — Encounter (HOSPITAL_COMMUNITY): Payer: Self-pay

## 2022-08-13 DIAGNOSIS — R2231 Localized swelling, mass and lump, right upper limb: Secondary | ICD-10-CM

## 2022-08-22 ENCOUNTER — Ambulatory Visit (INDEPENDENT_AMBULATORY_CARE_PROVIDER_SITE_OTHER): Payer: HMO | Admitting: Family Medicine

## 2022-09-07 ENCOUNTER — Ambulatory Visit: Payer: HMO | Admitting: Podiatry

## 2022-09-11 ENCOUNTER — Ambulatory Visit (INDEPENDENT_AMBULATORY_CARE_PROVIDER_SITE_OTHER): Payer: HMO | Admitting: Podiatry

## 2022-09-11 DIAGNOSIS — E1165 Type 2 diabetes mellitus with hyperglycemia: Secondary | ICD-10-CM

## 2022-09-11 DIAGNOSIS — M216X2 Other acquired deformities of left foot: Secondary | ICD-10-CM

## 2022-09-11 DIAGNOSIS — M216X1 Other acquired deformities of right foot: Secondary | ICD-10-CM

## 2022-09-11 DIAGNOSIS — Q828 Other specified congenital malformations of skin: Secondary | ICD-10-CM

## 2022-09-11 NOTE — Progress Notes (Signed)
Patient presents today to pick up custom molded foot orthotics recommended by Dr. Stacie Acres.   Orthotics were dispensed and fit was satisfactory. Reviewed instructions for break-in and wear. Written instructions given to patient.  Patient will follow up as needed.  Patient came in to pick up diabetic shoes and orthotics, Diabetic shoes were too tight. Patient was ordered new diabetic shoes and was told we would call her when they come in.    [name] - order # [id number]

## 2022-09-12 ENCOUNTER — Encounter (INDEPENDENT_AMBULATORY_CARE_PROVIDER_SITE_OTHER): Payer: Self-pay | Admitting: Family Medicine

## 2022-09-12 ENCOUNTER — Ambulatory Visit (INDEPENDENT_AMBULATORY_CARE_PROVIDER_SITE_OTHER): Payer: HMO | Admitting: Family Medicine

## 2022-09-12 VITALS — BP 168/76 | HR 54 | Temp 98.1°F | Ht 62.0 in | Wt 292.0 lb

## 2022-09-12 DIAGNOSIS — E860 Dehydration: Secondary | ICD-10-CM

## 2022-09-12 DIAGNOSIS — E669 Obesity, unspecified: Secondary | ICD-10-CM

## 2022-09-12 DIAGNOSIS — Z6841 Body Mass Index (BMI) 40.0 and over, adult: Secondary | ICD-10-CM | POA: Diagnosis not present

## 2022-09-12 DIAGNOSIS — E559 Vitamin D deficiency, unspecified: Secondary | ICD-10-CM | POA: Diagnosis not present

## 2022-09-12 MED ORDER — VITAMIN D (ERGOCALCIFEROL) 1.25 MG (50000 UNIT) PO CAPS: 50000.0000 [IU] | ORAL_CAPSULE | ORAL | 0 refills | Status: DC

## 2022-09-26 ENCOUNTER — Ambulatory Visit (INDEPENDENT_AMBULATORY_CARE_PROVIDER_SITE_OTHER): Payer: HMO | Admitting: Podiatry

## 2022-09-26 ENCOUNTER — Telehealth: Payer: Self-pay | Admitting: Podiatry

## 2022-09-26 DIAGNOSIS — M216X1 Other acquired deformities of right foot: Secondary | ICD-10-CM

## 2022-09-26 DIAGNOSIS — E1165 Type 2 diabetes mellitus with hyperglycemia: Secondary | ICD-10-CM

## 2022-09-26 DIAGNOSIS — M216X2 Other acquired deformities of left foot: Secondary | ICD-10-CM

## 2022-09-26 DIAGNOSIS — Q828 Other specified congenital malformations of skin: Secondary | ICD-10-CM

## 2022-09-26 NOTE — Telephone Encounter (Signed)
Patient called and lvm to get scheduled for diabetic shoes. I called patient back and lvm to get her on the schedule this week Per Mrs. Whitney [name] note.

## 2022-09-26 NOTE — Progress Notes (Signed)
Chief Complaint:   OBESITY Quanta is here to discuss her progress with her obesity treatment plan along with follow-up of her obesity related diagnoses. Eldoris is on the Category 2 Plan and states she is following her eating plan approximately 60% of the time. Brailynn states she is doing 0 minutes 0 times per week.  Today's visit was #: 28 Starting weight: 313 lbs Starting date: 04/06/2021 Today's weight: 292 lbs Today's date: 09/12/2022 Total lbs lost to date: 21 Total lbs lost since last in-office visit: 0  Interim History: Imara has had a lot of temptations and has gained weight over the holiday. She plans on getting back on track with her weight loss efforts after Christmas.   Subjective:   1. Vitamin D deficiency Yanna is on Vitamin D, and she is due for labs soon.   2. Dehydration Delorus's last creatinine has increased and her Na+ has also increased. She would like to know how much water to drink.   Assessment/Plan:   1. Vitamin D deficiency [place] will continue prescription Vitamin D 50,000 IU every week, and we will refill for 1 month. She will follow-up for routine testing of Vitamin D, at least 2-3 times per year to avoid over-replacement.  - Vitamin D, Ergocalciferol, (DRISDOL) 1.25 MG (50000 UNIT) CAPS capsule; Take 1 capsule (50,000 Units total) by mouth every 7 (seven) days.  Dispense: 4 capsule; Refill: 0  2. Dehydration Kennidi is to increase her water intake to 100 oz daily, and we will recheck labs at her next visit.   3. Obesity, Current BMI 53.5 Kerrin is currently in the action stage of change. As such, her goal is to continue with weight loss efforts. She has agreed to the Category 2 Plan.   We will recheck fasting labs at her next visit.   Jadence is to work on increasing her water intake to at least 100 oz daily.   Behavioral modification strategies: increasing water intake.  Eavan has agreed to follow-up with our clinic in 4 to 6 weeks. She was informed  of the importance of frequent follow-up visits to maximize her success with intensive lifestyle modifications for her multiple health conditions.   Objective:   Blood pressure (!) 168/76, pulse (!) 54, temperature 98.1 F (36.7 C), height '5\' 2"'$  (1.575 m), weight 292 lb (132.5 kg), SpO2 100 %. Body mass index is 53.41 kg/m.  General: Cooperative, alert, well developed, in no acute distress. HEENT: Conjunctivae and lids unremarkable. Cardiovascular: Regular rhythm.  Lungs: Normal work of breathing. Neurologic: No focal deficits.   Lab Results  Component Value Date   CREATININE 0.91 07/26/2022   BUN 30 (H) 07/26/2022   NA 141 07/26/2022   K 4.6 07/26/2022   CL 101 07/26/2022   CO2 24 07/26/2022   Lab Results  Component Value Date   ALT 11 06/26/2019   AST 11 (A) 03/29/2021   ALKPHOS 91 06/26/2019   BILITOT 1.2 06/26/2019   Lab Results  Component Value Date   HGBA1C 6.4 (H) 09/06/2021   HGBA1C 9.4 03/29/2021   HGBA1C 7.5 (H) 06/26/2019   HGBA1C 7.6 (H) 07/12/2017   HGBA1C 7.6 (H) 11/16/2011   No results found for: "INSULIN" Lab Results  Component Value Date   TSH 3.348 11/16/2011   Lab Results  Component Value Date   CHOL 184 03/29/2021   HDL 52 03/29/2021   LDLCALC 109 03/29/2021   TRIG 127 03/29/2021   Lab Results  Component Value Date  VD25OH 49.4 02/15/2022   VD25OH 39.6 09/06/2021   VD25OH 9.6 (L) 04/06/2021   Lab Results  Component Value Date   WBC 8.4 11/30/2021   HGB 11.8 (L) 11/30/2021   HCT 37.6 11/30/2021   MCV 86.6 11/30/2021   PLT 251 11/30/2021   Lab Results  Component Value Date   IRON 68 11/16/2011   TIBC 302 11/16/2011   FERRITIN 253 11/16/2011   Attestation Statements:   Reviewed by clinician on day of visit: allergies, medications, problem list, medical history, surgical history, family history, social history, and previous encounter notes.   I, Trixie Dredge, am acting as transcriptionist for Dennard Nip, MD.  I have  reviewed the above documentation for accuracy and completeness, and I agree with the above. -  Dennard Nip, MD

## 2022-10-04 DIAGNOSIS — Z7409 Other reduced mobility: Secondary | ICD-10-CM | POA: Diagnosis not present

## 2022-10-04 DIAGNOSIS — L0390 Cellulitis, unspecified: Secondary | ICD-10-CM | POA: Diagnosis not present

## 2022-10-04 DIAGNOSIS — R21 Rash and other nonspecific skin eruption: Secondary | ICD-10-CM | POA: Diagnosis not present

## 2022-10-04 DIAGNOSIS — R600 Localized edema: Secondary | ICD-10-CM | POA: Diagnosis not present

## 2022-10-20 ENCOUNTER — Encounter (HOSPITAL_COMMUNITY): Payer: Self-pay | Admitting: *Deleted

## 2022-10-20 ENCOUNTER — Other Ambulatory Visit: Payer: Self-pay

## 2022-10-20 ENCOUNTER — Ambulatory Visit (HOSPITAL_COMMUNITY)
Admission: EM | Admit: 2022-10-20 | Discharge: 2022-10-20 | Disposition: A | Discharge status: Home / Self Care | Payer: PPO | Attending: Internal Medicine | Admitting: Internal Medicine

## 2022-10-20 DIAGNOSIS — J069 Acute upper respiratory infection, unspecified: Secondary | ICD-10-CM | POA: Insufficient documentation

## 2022-10-20 DIAGNOSIS — R21 Rash and other nonspecific skin eruption: Secondary | ICD-10-CM | POA: Diagnosis not present

## 2022-10-20 DIAGNOSIS — Z1152 Encounter for screening for COVID-19: Secondary | ICD-10-CM | POA: Diagnosis not present

## 2022-10-20 MED ORDER — METHYLPREDNISOLONE 4 MG PO TBPK: ORAL_TABLET | ORAL | 0 refills | Status: DC

## 2022-10-20 MED ORDER — FLUCONAZOLE 150 MG PO TABS: 150.0000 mg | ORAL_TABLET | Freq: Every day | ORAL | 0 refills | Status: DC

## 2022-10-20 NOTE — ED Triage Notes (Signed)
Pt has a runny nose and wants a COVID test

## 2022-10-20 NOTE — Discharge Instructions (Signed)
Take the steroid dose pak as directed starting tomorrow. Take with food to avoid stomach upset.   Take one pill of diflucan starting tomorrow to cover for possible yeast infection cause of rash.  Follow-up with your primary care provider as scheduled on Tuesday.  Your blood sugars may increase while taking the steroid, this is normal. They will return back down to normal after the steroid is complete.  If you develop any new or worsening symptoms or do not improve in the next 2 to 3 days, please return.  If your symptoms are severe, please go to the emergency room.  Follow-up with your primary care provider for further evaluation and management of your symptoms as well as ongoing wellness visits.  I hope you feel better!

## 2022-10-20 NOTE — ED Provider Notes (Signed)
Whitney [name]    CSN: [id number] Arrival date & time: 10/20/22  1629      History   Chief Complaint Chief Complaint  Patient presents with   Rash    HPI SUNDEEP [name] is a 67 y.o. female.   Patient with history of obesity, type 2 diabetes, CHF, cardiac arrest, MI, HTN, and neuropathy presents to urgent care for evaluation of rash to the chest, abdomen, arms, and legs bilaterally. Rash started approximately 10-14 days ago. She was placed on antibiotics for rash by PCP, but this did not help with symptoms. Rash is very itchy, dry, and irritating. She denies chest pain, shortness of breath, sensation of throat closure, and sore throat. She has not changed soaps or laundry detergents recently. No new bedding, known exposure to irritants/allergens, or exposure to poisonous plants. Patient is concerned that the rash/irritation started because she is not "clean enough" and has been washing her skin consistently.  She has been washing her skin a few times throughout the day since rash onset.  Denies history of eczema or psoriasis.  She states her blood sugars have been stable and she checks them daily prior to giving herself her insulin.  Denies recent steroid use.  She has not attempted use of any over-the-counter lotions or creams or other medications prior to arrival urgent care for her rash.  No drainage, warmth, or swelling to the rash.  She has not any fever, chills, nausea, vomiting, abdominal pain, generalized bodyaches, neck pain, viral URI symptoms, or dizziness.  She is not vaccinated against shingles.  Patient is also requesting COVID-19 testing as she has had a runny nose and slight cough for the last few days. Denies shortness of breath, chest pain, and heart palpitations. No nausea, vomiting, diarrhea, or constipation. No known sick contacts with similar symptoms. She is not  a smoker and denies drug use. History of CHF and allergic rhinitis, denies history of asthma/COPD.  No new orthopnea, slight leg swelling but states this is baseline. Denies headache, ear pain, vision changes, and decreased oral intake. She has not attempted use of any OTC medicines prior to arrival at urgent care.     Past Medical History:  Diagnosis Date   Arthritis    left knee,right hip   Back pain    Cardiac arrest (HCC)    CHF (congestive heart failure) (HCC)    Constipation    Depression    Diabetes mellitus    GERD (gastroesophageal reflux disease)    Glaucoma    Glaucoma    H/O cardiac arrest 11/2011   PEA   Heart murmur    Hyperlipidemia    Hypertension    Joint pain    Myocardial infarction ([place]) 2013   Palpitations    Sleep apnea    Bring machine,mask and tubing   Swelling     Patient Active Problem List   Diagnosis Date Noted   Dehydration 09/12/2022   Allergic rhinitis due to pollen 03/02/2022   Atherosclerotic heart disease of native coronary artery without angina pectoris 03/02/2022   Chronic venous stasis dermatitis 03/02/2022   Decreased estrogen level 03/02/2022   Long term (current) use of insulin ([place]) 03/02/2022   Neuropathy 03/02/2022   Peripheral venous insufficiency 03/02/2022   Varicose veins of lower extremity with inflammation 03/02/2022   Plantar flexed metatarsal bone of left foot 06/26/2021   Plantar flexed metatarsal bone of right foot 06/26/2021   Diabetes mellitus ([place]) 06/20/2021   Vitamin  D deficiency 05/15/2021   Detrusor muscle hypertonia 11/04/2019   Disorder of mitral valve 11/04/2019   Hypertension associated with type 2 diabetes mellitus ([place]) 11/04/2019   Gastro-esophageal reflux disease without esophagitis 11/04/2019   Hay fever 11/04/2019   Pure hypercholesterolemia 11/04/2019   Type 2 diabetes mellitus with hyperglycemia ([place]) 11/04/2019   Hypokalemia 06/25/2019   History of cardiomyopathy 06/25/2019   Pain due to onychomycosis of toenails of both feet 03/18/2019   Porokeratosis 03/18/2019   Acute right lumbar  radiculopathy 07/25/2017   At risk for adverse drug event 07/16/2017   Humerus fracture 07/12/2017   Morbid obesity due to excess calories ([place]) 74/05/1447   Chronic systolic heart failure ([place]) 12/14/2015   Chest pain 12/14/2015   Angina decubitus 12/14/2015   Class 3 severe obesity with serious comorbidity and body mass index (BMI) of 50.0 to 59.9 in adult ([place]) 01/06/2013   Arthritis of knee, degenerative 01/06/2013   Osteoarthritis of both knees 01/06/2013   OSA (obstructive sleep apnea) 11/30/2011   Cardiac arrest ([place]) 11/13/2011   Diabetes mellitus type 2, uncontrolled 11/13/2011    Past Surgical History:  Procedure Laterality Date   BACK SURGERY     COLONOSCOPY N/A 09/12/2016   Procedure: COLONOSCOPY;  Surgeon: Clarene Essex, MD;  Location: [facility];  Service: Endoscopy;  Laterality: N/A;   CYSTOSCOPY W/ URETERAL STENT PLACEMENT Left 01/04/2013   Procedure: CYSTOSCOPY WITH RETROGRADE PYELOGRAM/URETERAL STENT PLACEMENT;  Surgeon: Bernestine Amass, MD;  Location: [facility];  Service: Urology;  Laterality: Left;   CYSTOSCOPY WITH RETROGRADE PYELOGRAM, URETEROSCOPY AND STENT PLACEMENT Left 01/26/2013   Procedure: CYSTOSCOPY, JJ STENT REMOVAL, LEFT URETEROSCOPY, ;  Surgeon: Bernestine Amass, MD;  Location: [facility];  Service: Urology;  Laterality: Left;  CYSTO, JJ STENT REMOVAL, LEFT URETEROSCOPY    FOOT SURGERY     HERNIA REPAIR     HOLMIUM LASER APPLICATION Left 1/85/6314   Procedure: HOLMIUM LASER LITHOTRIPSY ;  Surgeon: Bernestine Amass, MD;  Location: [facility];  Service: Urology;  Laterality: Left;   KNEE SURGERY     left   PARTIAL HYSTERECTOMY      OB History     Gravida  0   Para  0   Term  0   Preterm  0   AB  0   Living  0      SAB  0   IAB  0   Ectopic  0   Multiple  0   Live Births  0            Home Medications    Prior to Admission medications   Medication Sig Start Date End Date Taking? Authorizing Provider  fluconazole (DIFLUCAN) 150 MG tablet  Take 1 tablet (150 mg total) by mouth daily. 10/20/22  Yes Talbot Grumbling, FNP  methylPREDNISolone (MEDROL DOSEPAK) 4 MG TBPK tablet Take as directed. 10/20/22  Yes Talbot Grumbling, FNP  amLODipine (NORVASC) 5 MG tablet Take 1 tablet (5 mg total) by mouth daily. 01/16/22   [name], MD  aspirin EC 81 MG tablet Take 1 tablet (81 mg total) by mouth daily. Swallow whole. 06/23/20   [name], MD  atorvastatin (LIPITOR) 40 MG tablet Take 1 tablet (40 mg total) by mouth daily. 01/16/22   [name], MD  BD INSULIN SYRINGE U/F 31G X 5/16" 0.5 ML MISC as directed.  03/02/19   [provider]  benzonatate (TESSALON) 100 MG capsule Take 1 capsule (  100 mg total) by mouth every 8 (eight) hours. 05/11/22   Raspet, Derry Skill, PA-C  blood glucose meter kit and supplies KIT Dispense based on patient and insurance preference. Use up to four times daily as directed. (FOR ICD-9 250.00, 250.01). 09/02/17   Medina-Vargas, Monina C, NP  Chlorphen-Phenyleph-APAP (CORICIDIN D COLD/FLU/SINUS) 2-5-325 MG TABS Take 1 tablet by mouth every 6 (six) hours as needed (for congestion or cold-like symptoms).     [provider]  furosemide (LASIX) 40 MG tablet Take 1 tablet (40 mg total) by mouth twice daily for 5 days only, then decrease to taking 1 tablet (40 mg total) by mouth daily thereafter. 07/18/22   [name], MD  gabapentin (NEURONTIN) 300 MG capsule Take 300 mg by mouth See admin instructions. Take 300 mg by mouth two times a day and an additional 300 mg once daily as needed for nerve pain 03/11/19   [provider]  Lancets (ONETOUCH DELICA PLUS [id number]) MISC USE TO TEST YOUR BLOOD SUGAR 2 TIMES A DAY 11/23/19   [provider]  lisinopril (ZESTRIL) 40 MG tablet Take 1 tablet (40 mg total) by mouth daily. 01/16/22   [name], MD  loratadine (CLARITIN) 10 MG tablet Take 10 mg by mouth daily. 12/07/19   [provider]   metoprolol succinate (TOPROL-XL) 100 MG 24 hr tablet Take 1 tablet (100 mg total) by mouth daily before breakfast. Take with or immediately following a meal. 08/29/17   Medina-Vargas, Monina C, NP  NOVOLIN N 100 UNIT/ML injection Inject 35 Units into the skin 2 (two) times daily after a meal. 35 units in the morning and 15 units at night time. 08/08/18   [provider]  [facility] ULTRA test strip 2 (two) times daily. 01/14/20   [provider]  oxybutynin (DITROPAN) 5 MG tablet Take 1 tablet (5 mg total) by mouth daily. 08/29/17   Medina-Vargas, Monina C, NP  pantoprazole (PROTONIX) 40 MG tablet Take 1 tablet (40 mg total) by mouth daily. 08/29/17   Medina-Vargas, Monina C, NP  spironolactone (ALDACTONE) 25 MG tablet Take 1 tablet (25 mg total) by mouth daily. 01/16/22   [name], MD  traMADol (ULTRAM) 50 MG tablet Take 2 tablets (100 mg total) by mouth every 8 (eight) hours as needed for severe pain. 08/29/17   Medina-Vargas, Monina C, NP  Vitamin D, Ergocalciferol, (DRISDOL) 1.25 MG (50000 UNIT) CAPS capsule Take 1 capsule (50,000 Units total) by mouth every 7 (seven) days. 09/12/22   Starlyn Skeans, MD    Family History Family History  Problem Relation Age of Onset   Obesity Mother    Kidney disease Mother    Heart disease Mother    Hyperlipidemia Mother    Hypertension Mother    Diabetes Mother    Asthma Mother    Anxiety disorder Father    Depression Father    Heart disease Father    Hyperlipidemia Father    Hypertension Father    Diabetes Father     Social History Social History   Tobacco Use   Smoking status: Never   Smokeless tobacco: Never  Vaping Use   Vaping Use: Never used  Substance Use Topics   Alcohol use: No   Drug use: No     Allergies   Contrast media [iodinated contrast media], Empagliflozin, and Metformin   Review of Systems Review of Systems Per HPI  Physical Exam Triage Vital Signs ED Triage Vitals  Enc Vitals  Group     BP --      Pulse Rate 10/20/22 1810 (!) 59     Resp 10/20/22 1810 18     Temp 10/20/22 1810 97.8 F (36.6 C)     Temp src --      SpO2 10/20/22 1810 98 %     Weight --      Height --      Head Circumference --      Peak Flow --      Pain Score 10/20/22 1808 0     Pain Loc --      Pain Edu? --      Excl. in [place]? --    No data found.  Updated Vital Signs Pulse (!) 59   Temp 97.8 F (36.6 C)   Resp 18   SpO2 98%   Visual Acuity Right Eye Distance:   Left Eye Distance:   Bilateral Distance:    Right Eye Near:   Left Eye Near:    Bilateral Near:     Physical Exam Vitals and nursing note reviewed.  Constitutional:      Appearance: She is not ill-appearing or toxic-appearing.  HENT:     Head: Normocephalic and atraumatic.     Right Ear: Hearing, tympanic membrane, ear canal and external ear normal.     Left Ear: Hearing, tympanic membrane, ear canal and external ear normal.     Nose: Rhinorrhea present.     Mouth/Throat:     Lips: Pink.     Mouth: Mucous membranes are moist.     Pharynx: No posterior oropharyngeal erythema.  Eyes:     General: Lids are normal. Vision grossly intact. Gaze aligned appropriately.     Extraocular Movements: Extraocular movements intact.     Conjunctiva/sclera: Conjunctivae normal.  Cardiovascular:     Rate and Rhythm: Normal rate and regular rhythm.     Heart sounds: Normal heart sounds, S1 normal and S2 normal.  Pulmonary:     Effort: Pulmonary effort is normal. No respiratory distress.     Breath sounds: Normal breath sounds and air entry.  Musculoskeletal:     Cervical back: Neck supple.     Right lower leg: Edema (trace) present.     Left lower leg: Edema (trace) present.  Lymphadenopathy:     Cervical: No cervical adenopathy.  Skin:    General: Skin is warm and dry.     Capillary Refill: Capillary refill takes less than 2 seconds.     Findings: Erythema and rash present.     Comments: Patchy areas of erythema to  the generalized chest and bilateral arms present.  Image of the left breast located below, however shows patchy erythema described to the bilateral chest.  No drainage or warmth.  No signs of excoriation to the chest. Patchy erythematous lesions underneath the breasts are present, however they are also non-draining and very dry. Lesions to the legs are dry, scaly, and have slight psoriatic appearance as seen in image below.   Neurological:     General: No focal deficit present.     Mental Status: She is alert and oriented to person, place, and time. Mental status is at baseline.     Cranial Nerves: No dysarthria or facial asymmetry.  Psychiatric:        Mood and Affect: Mood normal.        Speech: Speech normal.        Behavior: Behavior normal.  Thought Content: Thought content normal.        Judgment: Judgment normal.    Left breast   Right thigh lesions    UC Treatments / Results  Labs (all labs ordered are listed, but only abnormal results are displayed) Labs Reviewed  SARS CORONAVIRUS 2 (TAT 6-24 HRS)    EKG   Radiology No results found.  Procedures Procedures (including critical care time)  Medications Ordered in UC Medications - No data to display  Initial Impression / Assessment and Plan / UC Course  I have reviewed the triage vital signs and the nursing notes.  Pertinent labs & imaging results that were available during my care of the patient were reviewed by me and considered in my medical decision making (see chart for details).   1. Rash and nonspecific skin eruption Rash presentation is consistent with inflammatory etiology and will likely respond well to course of steroid. Methylprednisolone dose pak sent to pharmacy to be taken as prescribed. No NSAIDs while taking steroid. This may cause blood sugars to increase, discussed this with patient who expresses understanding. She regularly checks sugars and uses insulin, sugars will return to normal when  steroid ends. Would like to cover for possible fungal etiology as well due to history of diabetes as she is more prone to fungal infections. She is to stop washing skin so frequently as this is likely contributing to itching and dryness. She may gently wash once a day with moisturizing soap. PCP follow-up recommended, patient may benefit from follow-up with dermatologist as well if symptoms persist.   2. Viral URI with cough COVID-19 testing is pending per patient request.  We will call patient if this is positive.  Quarantine guidelines discussed.   May continue taking over the counter medications as directed for further symptomatic relief.    Nonpharmacologic interventions for symptom relief provided and after visit summary below. Advised to push fluids to stay well hydrated while recovering from viral illness.   Discussed physical exam and available lab work findings in clinic with patient.  Counseled patient regarding appropriate use of medications and potential side effects for all medications recommended or prescribed today. Discussed red flag signs and symptoms of worsening condition,when to call the PCP office, return to urgent care, and when to seek higher level of care in the emergency department. Patient verbalizes understanding and agreement with plan. All questions answered. Patient discharged in stable condition.     Final Clinical Impressions(s) / UC Diagnoses   Final diagnoses:  Rash and nonspecific skin eruption  Viral URI with cough     Discharge Instructions      Take the steroid dose pak as directed starting tomorrow. Take with food to avoid stomach upset.   Take one pill of diflucan starting tomorrow to cover for possible yeast infection cause of rash.  Follow-up with your primary care provider as scheduled on Tuesday.  Your blood sugars may increase while taking the steroid, this is normal. They will return back down to normal after the steroid is complete.  If you  develop any new or worsening symptoms or do not improve in the next 2 to 3 days, please return.  If your symptoms are severe, please go to the emergency room.  Follow-up with your primary care provider for further evaluation and management of your symptoms as well as ongoing wellness visits.  I hope you feel better!     ED Prescriptions     Medication Sig Dispense Auth. Provider  fluconazole (DIFLUCAN) 150 MG tablet Take 1 tablet (150 mg total) by mouth daily. 1 tablet Joella Prince M, FNP   methylPREDNISolone (MEDROL DOSEPAK) 4 MG TBPK tablet Take as directed. 1 each Talbot Grumbling, FNP      PDMP not reviewed this encounter.   Talbot Grumbling, [place] 10/22/22 2153

## 2022-10-20 NOTE — ED Triage Notes (Signed)
Pt reports rash on body  pt has seen the MD for rash but skin has not improved. Pt has Large blotches on upper chest bilateral and some rash on RT hand.

## 2022-10-21 LAB — SARS CORONAVIRUS 2 (TAT 6-24 HRS): SARS Coronavirus 2: NEGATIVE

## 2022-10-22 DIAGNOSIS — R739 Hyperglycemia, unspecified: Secondary | ICD-10-CM | POA: Diagnosis not present

## 2022-10-22 DIAGNOSIS — R21 Rash and other nonspecific skin eruption: Secondary | ICD-10-CM | POA: Diagnosis not present

## 2022-10-22 DIAGNOSIS — L03119 Cellulitis of unspecified part of limb: Secondary | ICD-10-CM | POA: Diagnosis not present

## 2022-10-23 ENCOUNTER — Ambulatory Visit (INDEPENDENT_AMBULATORY_CARE_PROVIDER_SITE_OTHER): Payer: HMO | Admitting: Family Medicine

## 2022-10-23 DIAGNOSIS — I5022 Chronic systolic (congestive) heart failure: Secondary | ICD-10-CM | POA: Diagnosis not present

## 2022-10-23 DIAGNOSIS — E1169 Type 2 diabetes mellitus with other specified complication: Secondary | ICD-10-CM | POA: Diagnosis not present

## 2022-10-23 DIAGNOSIS — R21 Rash and other nonspecific skin eruption: Secondary | ICD-10-CM | POA: Diagnosis not present

## 2022-10-23 DIAGNOSIS — Z6841 Body Mass Index (BMI) 40.0 and over, adult: Secondary | ICD-10-CM | POA: Diagnosis not present

## 2022-10-23 DIAGNOSIS — R600 Localized edema: Secondary | ICD-10-CM | POA: Diagnosis not present

## 2022-10-30 ENCOUNTER — Ambulatory Visit (INDEPENDENT_AMBULATORY_CARE_PROVIDER_SITE_OTHER): Payer: Self-pay | Admitting: Family Medicine

## 2022-10-30 ENCOUNTER — Encounter (INDEPENDENT_AMBULATORY_CARE_PROVIDER_SITE_OTHER): Payer: Self-pay | Admitting: Family Medicine

## 2022-10-30 VITALS — BP 157/75 | HR 78 | Temp 97.5°F | Ht 62.0 in | Wt 295.0 lb

## 2022-10-30 DIAGNOSIS — E1169 Type 2 diabetes mellitus with other specified complication: Secondary | ICD-10-CM

## 2022-10-30 DIAGNOSIS — Z794 Long term (current) use of insulin: Secondary | ICD-10-CM

## 2022-10-30 DIAGNOSIS — D508 Other iron deficiency anemias: Secondary | ICD-10-CM

## 2022-10-30 DIAGNOSIS — Z6841 Body Mass Index (BMI) 40.0 and over, adult: Secondary | ICD-10-CM

## 2022-10-30 DIAGNOSIS — E559 Vitamin D deficiency, unspecified: Secondary | ICD-10-CM

## 2022-10-30 DIAGNOSIS — E669 Obesity, unspecified: Secondary | ICD-10-CM

## 2022-10-30 MED ORDER — FERROUS SULFATE 325 (65 FE) MG PO TABS: 325.0000 mg | ORAL_TABLET | Freq: Every day | ORAL | 0 refills | Status: DC

## 2022-10-30 MED ORDER — VITAMIN D (ERGOCALCIFEROL) 1.25 MG (50000 UNIT) PO CAPS: 50000.0000 [IU] | ORAL_CAPSULE | ORAL | 0 refills | Status: DC

## 2022-10-31 ENCOUNTER — Ambulatory Visit: Payer: PPO | Attending: Cardiology | Admitting: Cardiology

## 2022-10-31 ENCOUNTER — Encounter: Payer: Self-pay | Admitting: Cardiology

## 2022-10-31 VITALS — BP 112/60 | HR 72 | Ht 62.0 in | Wt 295.6 lb

## 2022-10-31 DIAGNOSIS — I10 Essential (primary) hypertension: Secondary | ICD-10-CM

## 2022-10-31 DIAGNOSIS — G4733 Obstructive sleep apnea (adult) (pediatric): Secondary | ICD-10-CM | POA: Diagnosis not present

## 2022-10-31 LAB — CBC WITH DIFFERENTIAL/PLATELET
Basophils Absolute: 0 10*3/uL (ref 0.0–0.2)
Basos: 0 %
EOS (ABSOLUTE): 0.2 10*3/uL (ref 0.0–0.4)
Eos: 2 %
Hematocrit: 31.1 % — ABNORMAL LOW (ref 34.0–46.6)
Hemoglobin: 10.4 g/dL — ABNORMAL LOW (ref 11.1–15.9)
Immature Grans (Abs): 0.1 10*3/uL (ref 0.0–0.1)
Immature Granulocytes: 1 %
Lymphocytes Absolute: 2.1 10*3/uL (ref 0.7–3.1)
Lymphs: 20 %
MCH: 27.2 pg (ref 26.6–33.0)
MCHC: 33.4 g/dL (ref 31.5–35.7)
MCV: 81 fL (ref 79–97)
Monocytes Absolute: 0.7 10*3/uL (ref 0.1–0.9)
Monocytes: 7 %
Neutrophils Absolute: 7.3 10*3/uL — ABNORMAL HIGH (ref 1.4–7.0)
Neutrophils: 70 %
Platelets: 254 10*3/uL (ref 150–450)
RBC: 3.82 x10E6/uL (ref 3.77–5.28)
RDW: 13.1 % (ref 11.7–15.4)
WBC: 10.3 10*3/uL (ref 3.4–10.8)

## 2022-10-31 LAB — VITAMIN D 25 HYDROXY (VIT D DEFICIENCY, FRACTURES): Vit D, 25-Hydroxy: 38.5 ng/mL (ref 30.0–100.0)

## 2022-10-31 MED ORDER — SALINE SPRAY 0.65 % NA SOLN: 2.0000 | Freq: Two times a day (BID) | NASAL | 3 refills | Status: DC

## 2022-10-31 NOTE — Patient Instructions (Signed)
Medication Instructions:  Please start taking nasal saline. Use 2 sprays in each nostril. Do this twice a day.   *If you need a refill on your cardiac medications before your next appointment, please call your pharmacy*   Lab Work: None.  If you have labs (blood work) drawn today and your tests are completely normal, you will receive your results only by: Time (if you have MyChart) OR [name] in the mail If you have any lab test that is abnormal or we need to change your treatment, we will call you to review the results.   Testing/Procedures: None.   Follow-Up: At [facility], you and your health needs are our priority.  As part of our continuing mission to provide you with exceptional heart care, we have created designated Provider Care Teams.  These Care Teams include your primary Cardiologist (physician) and Advanced Practice Providers (APPs -  Physician Assistants and Nurse Practitioners) who all work together to provide you with the care you need, when you need it.  We recommend signing up for the patient portal called "MyChart".  Sign up information is provided on this After Visit Summary.  MyChart is used to connect with patients for Virtual Visits (Telemedicine).  Patients are able to view lab/test results, encounter notes, upcoming appointments, etc.  Non-urgent messages can be sent to your provider as well.   To learn more about what you can do with MyChart, go to [email or web address].    Your next appointment:   1 year(s)  Provider:   Dr. Fransico Him, MD

## 2022-10-31 NOTE — Addendum Note (Signed)
Addended by: Joni Reining on: 10/31/2022 03:26 PM   Modules accepted: Orders

## 2022-10-31 NOTE — Progress Notes (Addendum)
Cardiology Office Note:    Date:  08/02/2020   ID:  Whitney [name], DOB 03-29-1956, MRN [id number] The patient was identified using 2 identifiers. Date:  10/31/2022   ID:  Whitney [name], DOB October 11, 1955, MRN [id number]  PCP:  [name], MD  Cardiologist:  [name], MD    Referring MD: [name], MD   Chief Complaint  Patient presents with   Sleep Apnea    History of Present Illness:    Whitney [name] is a 67 y.o. female with a hx of OSA on CPAP, depression, DM, GERD, HTN and OSA. She underwent sleep study in 2013 showing mild OSA with an AHI of 9.7/hr and underwent CPAP titration to 7cm H2O.  Unfortunately her PAP broke in 2018 and her DME had moved out of town and she never called to get another device.    She underwent split night sleep study showing severe OSA with an AHI of 44.8/hr and O2 sats as low as 82%.  She underwent CPAP titration to 15cm H2O.    She is doing well with her PAP device and thinks that she has gotten used to it.  She tolerates the nasal pillow mask and feels the pressure is adequate.  Since going on PAP she feels rested in the am and has no significant daytime sleepiness.  She denies any significant mouth or nasal dryness but does have problems with nasal congestion.   She does not think that she snores.    Past Medical History:  Diagnosis Date   Arthritis    left knee,right hip   Back pain    Cardiac arrest (HCC)    CHF (congestive heart failure) (HCC)    Constipation    Depression    Diabetes mellitus    GERD (gastroesophageal reflux disease)    Glaucoma    Glaucoma    H/O cardiac arrest 11/2011   PEA   Heart murmur    Hyperlipidemia    Hypertension    Joint pain    Myocardial infarction [facility]) 2013   Palpitations    Sleep apnea    Bring machine,mask and tubing   Swelling     Past Surgical History:  Procedure Laterality Date   BACK SURGERY     COLONOSCOPY N/A 09/12/2016   Procedure: COLONOSCOPY;  Surgeon: Clarene Essex,  MD;  Location: [facility];  Service: Endoscopy;  Laterality: N/A;   CYSTOSCOPY W/ URETERAL STENT PLACEMENT Left 01/04/2013   Procedure: CYSTOSCOPY WITH RETROGRADE PYELOGRAM/URETERAL STENT PLACEMENT;  Surgeon: Bernestine Amass, MD;  Location: [facility];  Service: Urology;  Laterality: Left;   CYSTOSCOPY WITH RETROGRADE PYELOGRAM, URETEROSCOPY AND STENT PLACEMENT Left 01/26/2013   Procedure: CYSTOSCOPY, JJ STENT REMOVAL, LEFT URETEROSCOPY, ;  Surgeon: Bernestine Amass, MD;  Location: [facility];  Service: Urology;  Laterality: Left;  CYSTO, JJ STENT REMOVAL, LEFT URETEROSCOPY    FOOT SURGERY     HERNIA REPAIR     HOLMIUM LASER APPLICATION Left 9/74/1638   Procedure: HOLMIUM LASER LITHOTRIPSY ;  Surgeon: Bernestine Amass, MD;  Location: [facility];  Service: Urology;  Laterality: Left;   KNEE SURGERY     left   PARTIAL HYSTERECTOMY      Current Medications: Current Meds  Medication Sig   amLODipine (NORVASC) 5 MG tablet Take 1 tablet (5 mg total) by mouth daily.   aspirin EC 81 MG tablet Take 1 tablet (81 mg total) by mouth daily. Swallow whole.   atorvastatin (LIPITOR) 40  MG tablet Take 1 tablet (40 mg total) by mouth daily.   BD INSULIN SYRINGE U/F 31G X 5/16" 0.5 ML MISC as directed.    benzonatate (TESSALON) 100 MG capsule Take 1 capsule (100 mg total) by mouth every 8 (eight) hours.   blood glucose meter kit and supplies KIT Dispense based on patient and insurance preference. Use up to four times daily as directed. (FOR ICD-9 250.00, 250.01).   cephALEXin (KEFLEX) 500 MG capsule Take 500 mg by mouth 3 (three) times daily.   Chlorphen-Phenyleph-APAP (CORICIDIN D COLD/FLU/SINUS) 2-5-325 MG TABS Take 1 tablet by mouth every 6 (six) hours as needed (for congestion or cold-like symptoms).    ferrous sulfate 325 (65 FE) MG tablet Take 1 tablet (325 mg total) by mouth daily with breakfast.   fluconazole (DIFLUCAN) 150 MG tablet Take 1 tablet (150 mg total) by mouth daily.   furosemide (LASIX) 40 MG tablet Take  1 tablet (40 mg total) by mouth twice daily for 5 days only, then decrease to taking 1 tablet (40 mg total) by mouth daily thereafter.   gabapentin (NEURONTIN) 300 MG capsule Take 300 mg by mouth See admin instructions. Take 300 mg by mouth two times a day and an additional 300 mg once daily as needed for nerve pain   Lancets (ONETOUCH DELICA PLUS [id number]) MISC USE TO TEST YOUR BLOOD SUGAR 2 TIMES A DAY   lisinopril (ZESTRIL) 40 MG tablet Take 1 tablet (40 mg total) by mouth daily.   loratadine (CLARITIN) 10 MG tablet Take 10 mg by mouth daily.   metoprolol succinate (TOPROL-XL) 100 MG 24 hr tablet Take 1 tablet (100 mg total) by mouth daily before breakfast. Take with or immediately following a meal.   NOVOLIN N 100 UNIT/ML injection Inject 35 Units into the skin 2 (two) times daily after a meal. 35 units in the morning and 15 units at night time.   ONETOUCH ULTRA test strip 2 (two) times daily.   oxybutynin (DITROPAN) 5 MG tablet Take 1 tablet (5 mg total) by mouth daily.   pantoprazole (PROTONIX) 40 MG tablet Take 1 tablet (40 mg total) by mouth daily.   spironolactone (ALDACTONE) 25 MG tablet Take 1 tablet (25 mg total) by mouth daily.   traMADol (ULTRAM) 50 MG tablet Take 2 tablets (100 mg total) by mouth every 8 (eight) hours as needed for severe pain.   Vitamin D, Ergocalciferol, (DRISDOL) 1.25 MG (50000 UNIT) CAPS capsule Take 1 capsule (50,000 Units total) by mouth every 7 (seven) days.     Allergies:   Contrast media [iodinated contrast media], Empagliflozin, and Metformin   Social History   Socioeconomic History   Marital status: Divorced    Spouse name: Not on file   Number of children: Not on file   Years of education: Not on file   Highest education level: Not on file  Occupational History   Occupation: Retired  Tobacco Use   Smoking status: Never   Smokeless tobacco: Never  Vaping Use   Vaping Use: Never used  Substance and Sexual Activity   Alcohol use: No   Drug  use: No   Sexual activity: Never  Other Topics Concern   Not on file  Social History Narrative   Not on file   Social Determinants of Health   Financial Resource Strain: Not on file  Food Insecurity: No Food Insecurity (07/03/2019)   Hunger Vital Sign    Worried About Running Out of Food in the Last  Year: Never true    [place] in the Last Year: Never true  Transportation Needs: No Transportation Needs (07/03/2019)   PRAPARE - Hydrologist (Medical): No    Lack of Transportation (Non-Medical): No  Physical Activity: Not on file  Stress: Not on file  Social Connections: Not on file     Family History: The patient's family history includes Anxiety disorder in her father; Asthma in her mother; Depression in her father; Diabetes in her father and mother; Heart disease in her father and mother; Hyperlipidemia in her father and mother; Hypertension in her father and mother; Kidney disease in her mother; Obesity in her mother.  ROS:   Please see the history of present illness.    ROS  All other systems reviewed and negative.   EKGs/Labs/Other Studies Reviewed:    The following studies were reviewed today: Sleep study 2013  EKG:  EKG is not ordered today.    Recent Labs: 11/30/2021: B Natriuretic Peptide 103.7 07/26/2022: BUN 30; Creatinine, Ser 0.91; NT-Pro BNP 121; Potassium 4.6; Sodium 141 10/30/2022: Hemoglobin 10.4; Platelets 254   Recent Lipid Panel    Component Value Date/Time   CHOL 184 03/29/2021 0000   TRIG 127 03/29/2021 0000   HDL 52 03/29/2021 0000   LDLCALC 109 03/29/2021 0000     Physical Exam:    VS:  BP 112/60   Pulse 72   Ht '5\' 2"'$  (1.575 m)   Wt 295 lb 9.6 oz (134.1 kg) Comment: Pt stated weight, couldnt weigh  SpO2 96%   BMI 54.07 kg/m     Wt Readings from Last 3 Encounters:  10/31/22 295 lb 9.6 oz (134.1 kg)  10/30/22 295 lb (133.8 kg)  09/12/22 292 lb (132.5 kg)    GEN: Well nourished, well developed in no  acute distress HEENT: Normal NECK: No JVD; No carotid bruits LYMPHATICS: No lymphadenopathy CARDIAC:RRR, no  rubs, gallops.  2/6 SM at RUSB RESPIRATORY:  Clear to auscultation without rales, wheezing or rhonchi  ABDOMEN: Soft, non-tender, non-distended MUSCULOSKELETAL:  No edema; No deformity  SKIN: Warm and dry NEUROLOGIC:  Alert and oriented x 3 PSYCHIATRIC:  Normal affect  ASSESSMENT:    1. OSA (obstructive sleep apnea)   2. Essential hypertension    PLAN:     In order of problems listed above:  1.  OSA - The patient is tolerating PAP therapy well without any problems. The PAP download performed by his DME was personally reviewed and interpreted by me today and showed an AHI of 0.2/hr on 16 cm H2O with 60% compliance in using more than 4 hours nightly.  The patient has been using and benefiting from PAP use and will continue to benefit from therapy.  -her compliance was down because she had a URI with runny nose  -I encouraged her to try to be more compliant with her device -I encouraged her to use nasal saline spray 2 sprays each nostril twice daily  2.  HTN -BP controlled on exam today -continue prescription drug management with Toprol XL '100mg'$  daily, Lisinopril '40mg'$  daily, spiro '25mg'$  daily and Amlodipine '5mg'$  daily with PRN refills.  -I have personally reviewed and interpreted outside labs performed by patient's PCP which showed SCr 0.93 and K+ 4.4 on 10/23/22  Time:   Today, I have spent 15 minutes with the patient with telehealth technology discussing the above problems.  Medication Adjustments/Labs and Tests Ordered: Current medicines are reviewed at length with  the patient today.  Concerns regarding medicines are outlined above.  No orders of the defined types were placed in this encounter.  No orders of the defined types were placed in this encounter.   Signed, Fransico Him, MD  10/31/2022 3:13 PM    [place]

## 2022-11-04 NOTE — Progress Notes (Deleted)
Cardiology Office Note:    Date:  11/04/2022   ID:  Whitney [name], DOB 08/04/1956, MRN [id number]  PCP:  [name], Terry  Cardiologist:  [name], MD  Advanced Practice Provider:  No care team member to display Electrophysiologist:  None   Referring MD: [name], MD    History of Present Illness:    Whitney [name] is a 67 y.o. female with a hx of systolic heart failure with recovered EF from 30-35% to 50% on most recent TTE on 06/16/20, history of respiratory arrest in 2013 which resulted in a cardiac arrest, non-obstructive CAD on cath 2006, hypertension, hyperlipidemia, diabetes mellitus on insulin, obstructive sleep apnea on CPAP and morbid obesity who presents to clinic for follow-up.  Patient was admitted to [facility] in September 2020 where she had presented with chest pain. During that admission, echo demonstrated LVEF 55-60% which was improved from prior imaging.  High-sensitivity troponin was negative x3, BNP was normal.  Given reassuring work-up, it was determined that her chest pain was likely noncardiac in etiology and the patient was discharged home.  Was seen in clinic on 07/2022 with worsening LE edema, weeping, pain, and pruiritis in the setting of excess sodium intake from deli meat, potato chips. She was found to have volume overload and Lasix increased to '40mg'$  BIDx5 days then daily thereafter, as well as Keflex '500mg'$  BID x 10 days for RLE cellulitis.   Was last seen in clinic by Melina Copa on 08/2022 where she was significantly improved.   Today, ***    Past Medical History:  Diagnosis Date   Arthritis    left knee,right hip   Back pain    Cardiac arrest (HCC)    CHF (congestive heart failure) (HCC)    Constipation    Depression    Diabetes mellitus    GERD (gastroesophageal reflux disease)    Glaucoma    Glaucoma    H/O cardiac arrest 11/2011   PEA   Heart murmur    Hyperlipidemia     Hypertension    Joint pain    Myocardial infarction [facility]) 2013   Palpitations    Sleep apnea    Bring machine,mask and tubing   Swelling     Past Surgical History:  Procedure Laterality Date   BACK SURGERY     COLONOSCOPY N/A 09/12/2016   Procedure: COLONOSCOPY;  Surgeon: Clarene Essex, MD;  [facility];  Service: Endoscopy;  Laterality: N/A;   CYSTOSCOPY W/ URETERAL STENT PLACEMENT Left 01/04/2013   Procedure: CYSTOSCOPY WITH RETROGRADE PYELOGRAM/URETERAL STENT PLACEMENT;  Surgeon: Bernestine Amass, MD;  [facility]: [facility];  Service: Urology;  Laterality: Left;   CYSTOSCOPY WITH RETROGRADE PYELOGRAM, URETEROSCOPY AND STENT PLACEMENT Left 01/26/2013   Procedure: CYSTOSCOPY, JJ STENT REMOVAL, LEFT URETEROSCOPY, ;  Surgeon: Bernestine Amass, MD;  Location: [facility];  Service: Urology;  Laterality: Left;  CYSTO, JJ STENT REMOVAL, LEFT URETEROSCOPY    FOOT SURGERY     HERNIA REPAIR     HOLMIUM LASER APPLICATION Left 99991111   Procedure: HOLMIUM LASER LITHOTRIPSY ;  Surgeon: Bernestine Amass, MD;  Location: [facility];  Service: Urology;  Laterality: Left;   KNEE SURGERY     left   PARTIAL HYSTERECTOMY      Current Medications: No outpatient medications have been marked as taking for the 11/05/22 encounter (Appointment) with [name], MD.     Allergies:  Contrast media [iodinated contrast media], Empagliflozin, and Metformin   Social History   Socioeconomic History   Marital status: Divorced    Spouse name: Not on file   Number of children: Not on file   Years of education: Not on file   Highest education level: Not on file  Occupational History   Occupation: Retired  Tobacco Use   Smoking status: Never   Smokeless tobacco: Never  Vaping Use   Vaping Use: Never used  Substance and Sexual Activity   Alcohol use: No   Drug use: No   Sexual activity: Never  Other Topics Concern   Not on file  Social History Narrative   Not on file   Social Determinants of Health    Financial Resource Strain: Not on file  Food Insecurity: No Food Insecurity (07/03/2019)   Hunger Vital Sign    Worried About Running Out of Food in the Last Year: Never true    Ran Out of Food in the Last Year: Never true  Transportation Needs: No Transportation Needs (07/03/2019)   PRAPARE - Hydrologist (Medical): No    Lack of Transportation (Non-Medical): No  Physical Activity: Not on file  Stress: Not on file  Social Connections: Not on file     Family History: The patient's family history includes Anxiety disorder in her father; Asthma in her mother; Depression in her father; Diabetes in her father and mother; Heart disease in her father and mother; Hyperlipidemia in her father and mother; Hypertension in her father and mother; Kidney disease in her mother; Obesity in her mother.  ROS:   Please see the history of present illness.    Review of Systems  Constitutional:  Positive for malaise/fatigue. Negative for chills and fever.  HENT:  Negative for hearing loss and sore throat.   Eyes:  Negative for blurred vision and redness.  Respiratory:  Positive for shortness of breath.   Cardiovascular:  Positive for chest pain, orthopnea and leg swelling (Bilateral weeping edema). Negative for palpitations, claudication and PND.  Gastrointestinal:  Positive for diarrhea. Negative for melena, nausea and vomiting.  Genitourinary:  Negative for dysuria and flank pain.  Musculoskeletal:  Positive for joint pain (L knee) and myalgias.  Skin:  Positive for itching.  Neurological:  Negative for dizziness and loss of consciousness.  Endo/Heme/Allergies:  Negative for environmental allergies.  Psychiatric/Behavioral:  Negative for substance abuse.     EKGs/Labs/Other Studies Reviewed:    The following studies were reviewed today:  Myoview 07/2021:   The study is normal. The study is low risk.   No ST deviation was noted.   LV perfusion is normal.   Left  ventricular function is normal. Nuclear stress EF: 61 %. The left ventricular ejection fraction is normal (55-65%).   Prior study available for comparison from 12/27/2014. There are changes compared to prior study which appear to be resolved.  TTE 11/2021: IMPRESSIONS   1. Left ventricular ejection fraction, by estimation, is 50 to 55%. The  left ventricle has low normal function. The left ventricle has no regional  wall motion abnormalities. There is mild left ventricular hypertrophy.  Left ventricular diastolic  parameters are consistent with Grade I diastolic dysfunction (impaired  relaxation).   2. Right ventricular systolic function is normal. The right ventricular  size is normal. Tricuspid regurgitation signal is inadequate for assessing  PA pressure.   3. Left atrial size was mildly dilated.   4. The mitral valve  is grossly normal. Trivial mitral valve  regurgitation.   5. The aortic valve was not well visualized. Aortic valve regurgitation  is not visualized. Mild aortic valve stenosis.   6. The inferior vena cava is normal in size with <50% respiratory  variability, suggesting right atrial pressure of 8 mmHg.   Comparison(s): No prior Echocardiogram.   TTE 07/2021: IMPRESSIONS   1. Mild apical hypokinesis. . Left ventricular ejection fraction, by  estimation, is 55 to 60%. The left ventricle has normal function. The left  ventricular internal cavity size was mildly dilated. There is mild left  ventricular hypertrophy. Left  ventricular diastolic parameters are indeterminate.   2. Right ventricular systolic function is normal. The right ventricular  size is normal.   3. The mitral valve is normal in structure. Mild mitral valve  regurgitation.   4. AV is trileaflet Appears to open well. Peak and mean gradients through  the LV/AV/aorta are 25 and 14 mm Hg respectively Turbulence appears to  start above valve (see aorta) Gradients not signficantly changed from  previous  echo . The aortic valve is  tricuspid. Aortic valve regurgitation is trivial. Mild aortic valve  sclerosis is present, with no evidence of aortic valve stenosis.   5. There is a thickening/brightness supravalvularly at sinotublar  junction. Question mild ridge. Turbulence seems to start at this point.   6. The inferior vena cava is dilated in size with <50% respiratory  variability, suggesting right atrial pressure of 15 mmHg.   TTE 06/06/20: 1. Left ventricular ejection fraction, by estimation, is 50%. The left  ventricle has mildly decreased function. The left ventricle demonstrates  global hypokinesis. There is mild left ventricular hypertrophy. Left  ventricular diastolic parameters are  consistent with Grade I diastolic dysfunction (impaired relaxation).   2. Right ventricular systolic function is normal. The right ventricular  size is normal. Tricuspid regurgitation signal is inadequate for assessing  PA pressure.   3. Left atrial size was mildly dilated.   4. The mitral valve is normal in structure. No evidence of mitral valve  regurgitation. No evidence of mitral stenosis.   5. The aortic valve is tricuspid. Aortic valve regurgitation is not  visualized. Mild aortic valve stenosis. Aortic valve mean gradient  measures 9.0 mmHg (gradient not significantly elevated but visually at  least mild AS).   6. The inferior vena cava is dilated in size with >50% respiratory  variability, suggesting right atrial pressure of 8 mmHg.  TTE 06/26/19: 1. Left ventricular ejection fraction, by visual estimation, is 55 to  60%. The left ventricle has normal function. Moderately increased left  ventricular size. There is no left ventricular hypertrophy.   2. Definity contrast agent was given IV to delineate the left ventricular  endocardial borders.   3. Elevated mean left atrial pressure.   4. Left ventricular diastolic Doppler parameters are consistent with  impaired relaxation pattern of LV  diastolic filling.   5. Global right ventricle has normal systolic function.The right  ventricular size is normal. No increase in right ventricular wall  thickness.   6. The mitral valve is normal in structure. Mild mitral valve  regurgitation.   Nuclear stress 12/26/2015: Nuclear stress EF: 60%. There was no ST segment deviation noted during stress. Defect 1: There is a small defect of moderate severity present in the basal inferolateral and mid inferolateral location. Findings consistent with prior myocardial infarction. This is a low risk study. The left ventricular ejection fraction is normal (55-65%).  EKG:  EKG is personally reviewed. 07/18/2022:  EKG was not ordered. 11/30/2021 (ED):  Sinus bradycardia at 54 bpm, Left ventricular hypertrophy with repolarization abnormality ( R in aVL , Cornell product , Romhilt-Estes ) Abnormal ECG When compared to prior, new t wave inversionin lead V6. No STEMI   Recent Labs: 11/30/2021: B Natriuretic Peptide 103.7 07/26/2022: BUN 30; Creatinine, Ser 0.91; NT-Pro BNP 121; Potassium 4.6; Sodium 141 10/30/2022: Hemoglobin 10.4; Platelets 254   Recent Lipid Panel    Component Value Date/Time   CHOL 184 03/29/2021 0000   TRIG 127 03/29/2021 0000   HDL 52 03/29/2021 0000   LDLCALC 109 03/29/2021 0000     Physical Exam:    VS:  There were no vitals taken for this visit.    Wt Readings from Last 3 Encounters:  10/31/22 295 lb 9.6 oz (134.1 kg)  10/30/22 295 lb (133.8 kg)  09/12/22 292 lb (132.5 kg)     GEN:  NAD HEENT: Normal NECK: No JVD; No carotid bruits CARDIAC: RRR, [id number] harsh systolic murmur, no rubs or gallops RESPIRATORY:  Clear to auscultation without rales, wheezing or rhonchi  ABDOMEN: Soft, non-tender, non-distended MUSCULOSKELETAL:  + weeping LE edema R>L and warm to palpation; No deformity  SKIN: Warm and dry; Soft tissue mass of right forearm NEUROLOGIC:  Alert and oriented x 3 PSYCHIATRIC:  Normal affect    ASSESSMENT:    No diagnosis found.   PLAN:    In order of problems listed above:  # Chronic systolic heart failure with Improved EF: EF originally 30-35% in the setting of a respiratory/cardiac arrest that improved to 50-55% on most recent TTE 11/2021. Currently, *** -Continue laisx '40mg'$  dialy -Continue metoprolol '100mg'$  XL -Continue lisinopril '40mg'$  daily -Continue spironolactone '25mg'$  daily -Low Na diet discussed at length today  #Chest Pain: #DOE:   Myoview 07/2021 with normal perfusion.  -Manage HF as above -Nuc 2022 normal -Cath 2006 with nonobstructive CAD -Symptoms non-exertional and improve with movement -Will continue to monitor and manage HF as below -Encourage weight loss as this may be contributing to symptoms   #Mild AS: TTE 11/2021 with mild AS. Will continue with serial monitoring with repeat TTE in 2 years.  -Repeat TTE 11/2023 unless clinical change   #Diabetes Mellitus on Insulin A1C 7.7. -Management per PCP   #Morbid Obesity BMI 53. Continuing to try to lose weight -Continue weight loss efforts -Discussed healthy diet and encouraged continued walking with her walker   #Hypertension Better controlled at home. -Diuresis as above -Continue amlodipine '5mg'$  daily -Continue metoprolol '100mg'$  XL -Continue lisinopril '40mg'$  daily -Continue spironolactone '25mg'$  daily  #Hyperlipidemia -Continue lipitor '40mg'$  daily  -LDL 90 -Management per PCP  #Obstructive sleep apnea -Continue CPAP   Medication Adjustments/Labs and Tests Ordered: Current medicines are reviewed at length with the patient today.  Concerns regarding medicines are outlined above.   No orders of the defined types were placed in this encounter.  No orders of the defined types were placed in this encounter.  There are no Patient Instructions on file for this visit.    Signed, [name], MD  11/04/2022 9:19 PM    [place]

## 2022-11-05 ENCOUNTER — Ambulatory Visit: Payer: PPO | Admitting: Cardiology

## 2022-11-12 DIAGNOSIS — H04123 Dry eye syndrome of bilateral lacrimal glands: Secondary | ICD-10-CM | POA: Diagnosis not present

## 2022-11-12 DIAGNOSIS — H401131 Primary open-angle glaucoma, bilateral, mild stage: Secondary | ICD-10-CM | POA: Diagnosis not present

## 2022-11-12 DIAGNOSIS — H1045 Other chronic allergic conjunctivitis: Secondary | ICD-10-CM | POA: Diagnosis not present

## 2022-11-13 NOTE — Progress Notes (Signed)
Chief Complaint:   OBESITY Whitney [name] is here to discuss her progress with her obesity treatment plan along with follow-up of her obesity related diagnoses. Whitney [name] is on the Category 2 Plan and states she is following her eating plan approximately 0% of the time. Whitney [name] states she is doing 0 minutes 0 times per week.  Today's visit was #: 20 Starting weight: 313 lbs Starting date: 04/06/2021 Today's weight: 295 lbs Today's date: 10/30/2022 Total lbs lost to date: 18 Total lbs lost since last in-office visit: 0  Interim History: Whitney [name] is struggling with weight gain, and she has had a lot of health issues and she hasn't been able to concentrate on her diet. She states she is ready to go back to her Category 2 eating plan.   Subjective:   1. Type 2 diabetes mellitus with other specified complication, with long-term current use of insulin (HCC) Chong ws on prednisone, and her A1c increased to 7.9 at her PCP at [facility] before this, and her glucose has increased to 400's and so prednisone was discontinued. She is working on getting back to her eating plan.   2. Vitamin D deficiency Whitney [name] has been off Vitamin D, and now she notes fatigue is worsening.   3. Iron deficiency anemia secondary to inadequate dietary iron intake Whitney [name] has questions about iron  supplementation.   Assessment/Plan:   1. Type 2 diabetes mellitus with other specified complication, with long-term current use of insulin (HCC) Whitney [name] is to go back to her Category 2 eating plan, and will continue to monitor her glucose.   2. Vitamin D deficiency We will check labs today, and we will refill prescription Vitamin D for 1 month.   - Vitamin D, Ergocalciferol, (DRISDOL) 1.25 MG (50000 UNIT) CAPS capsule; Take 1 capsule (50,000 Units total) by mouth every 7 (seven) days.  Dispense: 4 capsule; Refill: 0 - VITAMIN D 25 Hydroxy (Vit-D Deficiency, Fractures)  3. Iron deficiency anemia secondary to inadequate dietary iron intake We  will check labs today. Whitney [name] agreed to start [facility] 325 mg daily with no refills. She is to work on increasing fiber in her diet.   - CBC with Differential/Platelet - ferrous sulfate 325 (65 FE) MG tablet; Take 1 tablet (325 mg total) by mouth daily with breakfast.  Dispense: 30 tablet; Refill: 0  4. BMI 50.0-59.9, adult (HCC)  5. Obesity, Beginning BMI 59.14 Whitney [name] is currently in the action stage of change. As such, her goal is to continue with weight loss efforts. She has agreed to the Category 2 Plan.   Behavioral modification strategies: better snacking choices.  Whitney [name] has agreed to follow-up with our clinic in 4 weeks. She was informed of the importance of frequent follow-up visits to maximize her success with intensive lifestyle modifications for her multiple health conditions. We will continue to manage this complex ongoing medical condition.  Whitney [name] was informed we would discuss her lab results at her next visit unless there is a critical issue that needs to be addressed sooner. Whitney [name] agreed to keep her next visit at the agreed upon time to discuss these results.  Objective:   Blood pressure (!) 157/75, pulse 78, temperature (!) 97.5 F (36.4 C), height 5' 2"$  (1.575 m), weight 295 lb (133.8 kg), SpO2 96 %. Body mass index is 53.96 kg/m.  General: Cooperative, alert, well developed, in no acute distress. HEENT: Conjunctivae and lids unremarkable. Cardiovascular: Regular rhythm.  Lungs: Normal work of breathing. Neurologic: No focal deficits.  Lab Results  Component Value Date   CREATININE 0.91 07/26/2022   BUN 30 (H) 07/26/2022   NA 141 07/26/2022   K 4.6 07/26/2022   CL 101 07/26/2022   CO2 24 07/26/2022   Lab Results  Component Value Date   ALT 11 06/26/2019   AST 11 (A) 03/29/2021   ALKPHOS 91 06/26/2019   BILITOT 1.2 06/26/2019   Lab Results  Component Value Date   HGBA1C 6.4 (H) 09/06/2021   HGBA1C 9.4 03/29/2021   HGBA1C 7.5 (H) 06/26/2019   HGBA1C 7.6  (H) 07/12/2017   HGBA1C 7.6 (H) 11/16/2011   No results found for: "INSULIN" Lab Results  Component Value Date   TSH 3.348 11/16/2011   Lab Results  Component Value Date   CHOL 184 03/29/2021   HDL 52 03/29/2021   LDLCALC 109 03/29/2021   TRIG 127 03/29/2021   Lab Results  Component Value Date   VD25OH 38.5 10/30/2022   VD25OH 49.4 02/15/2022   VD25OH 39.6 09/06/2021   Lab Results  Component Value Date   WBC 10.3 10/30/2022   HGB 10.4 (L) 10/30/2022   HCT 31.1 (L) 10/30/2022   MCV 81 10/30/2022   PLT 254 10/30/2022   Lab Results  Component Value Date   IRON 68 11/16/2011   TIBC 302 11/16/2011   FERRITIN 253 11/16/2011   Attestation Statements:   Reviewed by clinician on day of visit: allergies, medications, problem list, medical history, surgical history, family history, social history, and previous encounter notes.   I, Trixie Dredge, am acting as transcriptionist for Dennard Nip, MD.  I have reviewed the above documentation for accuracy and completeness, and I agree with the above. -  Dennard Nip, MD

## 2022-11-14 DIAGNOSIS — Z794 Long term (current) use of insulin: Secondary | ICD-10-CM | POA: Diagnosis not present

## 2022-11-14 DIAGNOSIS — I5022 Chronic systolic (congestive) heart failure: Secondary | ICD-10-CM | POA: Diagnosis not present

## 2022-11-14 DIAGNOSIS — E1142 Type 2 diabetes mellitus with diabetic polyneuropathy: Secondary | ICD-10-CM | POA: Diagnosis not present

## 2022-11-14 DIAGNOSIS — Z6841 Body Mass Index (BMI) 40.0 and over, adult: Secondary | ICD-10-CM | POA: Diagnosis not present

## 2022-11-15 ENCOUNTER — Encounter (HOSPITAL_BASED_OUTPATIENT_CLINIC_OR_DEPARTMENT_OTHER): Payer: PPO | Admitting: Internal Medicine

## 2022-11-22 ENCOUNTER — Other Ambulatory Visit (INDEPENDENT_AMBULATORY_CARE_PROVIDER_SITE_OTHER): Payer: Self-pay | Admitting: Family Medicine

## 2022-11-22 DIAGNOSIS — D508 Other iron deficiency anemias: Secondary | ICD-10-CM

## 2022-11-27 ENCOUNTER — Ambulatory Visit (INDEPENDENT_AMBULATORY_CARE_PROVIDER_SITE_OTHER): Payer: HMO | Admitting: Family Medicine

## 2022-11-28 ENCOUNTER — Ambulatory Visit (INDEPENDENT_AMBULATORY_CARE_PROVIDER_SITE_OTHER): Payer: PPO | Admitting: Podiatry

## 2022-11-28 ENCOUNTER — Encounter: Payer: Self-pay | Admitting: Podiatry

## 2022-11-28 DIAGNOSIS — E1165 Type 2 diabetes mellitus with hyperglycemia: Secondary | ICD-10-CM | POA: Diagnosis not present

## 2022-11-28 DIAGNOSIS — B351 Tinea unguium: Secondary | ICD-10-CM | POA: Diagnosis not present

## 2022-11-28 DIAGNOSIS — M79675 Pain in left toe(s): Secondary | ICD-10-CM

## 2022-11-28 DIAGNOSIS — M79674 Pain in right toe(s): Secondary | ICD-10-CM | POA: Diagnosis not present

## 2022-11-28 DIAGNOSIS — Q828 Other specified congenital malformations of skin: Secondary | ICD-10-CM | POA: Diagnosis not present

## 2022-11-28 DIAGNOSIS — M216X2 Other acquired deformities of left foot: Secondary | ICD-10-CM

## 2022-11-28 NOTE — Progress Notes (Signed)
This patient returns to my office for at risk foot care.  This patient requires this care by a professional since this patient will be at risk due to having diabetes.  Patient has painful corn fifth toe right foot and painful callus on the outside of both feet  B/L.  This patient is unable to cut nails herself since the patient cannot reach her nails.These nails are painful walking and wearing shoes.  This patient presents for at risk foot care today.  General Appearance  Alert, conversant and in no acute stress.  Vascular  Defeered due to wearing compression socks.  Patient has not been seen for 9 months.  Neurologic  Senn-Weinstein monofilament wire test diminished  bilaterally. Muscle power within normal limits bilaterally.  Nails Thick disfigured discolored nails with subungual debris  from hallux to fifth toes bilaterally. No evidence of bacterial infection or drainage bilaterally.  Orthopedic  No limitations of motion  feet .  No crepitus or effusions noted.  No bony pathology or digital deformities noted.  Adducto varus fifth digit  Right foot.  Plantar flexed fifth metatarsal  B/L.  Skin  normotropic skin with no porokeratosis noted bilaterally.  No signs of infections or ulcers noted. Porokeratosis sub 5th  B/L.  Corn 5th digit right foot.    Onychomycosis  Pain in right toes  Pain in left toes  Porokeratosis B/L  Corn fifth toe right foot.  Consent was obtained for treatment procedures.   Mechanical debridement of nails 1-5  bilaterally performed with a nail nipper.  Filed with dremel without incident. Debridement of callus  Both feet with # 15 blade and dremel tool.    Return office visit   3 months                   Told patient to return for periodic foot care and evaluation due to potential at risk complications.   Gardiner Barefoot DPM

## 2022-12-03 ENCOUNTER — Telehealth (INDEPENDENT_AMBULATORY_CARE_PROVIDER_SITE_OTHER): Payer: PPO | Admitting: Family Medicine

## 2022-12-07 ENCOUNTER — Ambulatory Visit (INDEPENDENT_AMBULATORY_CARE_PROVIDER_SITE_OTHER): Payer: PPO

## 2022-12-07 ENCOUNTER — Ambulatory Visit (HOSPITAL_COMMUNITY)
Admission: EM | Admit: 2022-12-07 | Discharge: 2022-12-07 | Disposition: A | Discharge status: Home / Self Care | Payer: PPO | Attending: Physician Assistant | Admitting: Physician Assistant

## 2022-12-07 ENCOUNTER — Encounter (HOSPITAL_COMMUNITY): Payer: Self-pay | Admitting: *Deleted

## 2022-12-07 DIAGNOSIS — M7532 Calcific tendinitis of left shoulder: Secondary | ICD-10-CM

## 2022-12-07 DIAGNOSIS — M25512 Pain in left shoulder: Secondary | ICD-10-CM | POA: Diagnosis not present

## 2022-12-07 DIAGNOSIS — I10 Essential (primary) hypertension: Secondary | ICD-10-CM | POA: Diagnosis not present

## 2022-12-07 DIAGNOSIS — M19012 Primary osteoarthritis, left shoulder: Secondary | ICD-10-CM | POA: Diagnosis not present

## 2022-12-07 DIAGNOSIS — M7530 Calcific tendinitis of unspecified shoulder: Secondary | ICD-10-CM

## 2022-12-07 MED ORDER — LIDOCAINE 5 % EX PTCH: 1.0000 | MEDICATED_PATCH | CUTANEOUS | 0 refills | Status: DC

## 2022-12-07 MED ORDER — ACETAMINOPHEN 325 MG PO TABS
975.0000 mg | ORAL_TABLET | Freq: Once | ORAL | Status: AC
  Administered 2022-12-07: 975 mg via ORAL

## 2022-12-07 MED ORDER — HYDROCODONE-ACETAMINOPHEN 5-325 MG PO TABS: 1.0000 | ORAL_TABLET | Freq: Two times a day (BID) | ORAL | 0 refills | Status: AC | PRN

## 2022-12-07 MED ORDER — ACETAMINOPHEN 325 MG PO TABS
ORAL_TABLET | ORAL | Status: AC
  Filled 2022-12-07: qty 3

## 2022-12-07 NOTE — ED Triage Notes (Signed)
Pt states last night she started with left arm pain, she can't move the arm much at all. She denies any injury. She states the arm is sore to the touch. She has taken 3 '81Mg'$  ASA and 2 tramadol last night. She hasn't taken any meds today .

## 2022-12-07 NOTE — ED Provider Notes (Signed)
[place]    CSN: [id number] Arrival date & time: 12/07/22  1117      History   Chief Complaint Chief Complaint  Patient presents with   Arm Pain    HPI Whitney [name] is a 67 y.o. female.   Patient presents today with a 1 day history of severe left shoulder pain.  She reports that this began when she rolled over in bed.  She denies any known injury or recent activity prior to symptom onset.  She denies any associated chest pain, shortness of breath, nausea, vomiting, diaphoresis.  She has dislocated or fractured one of her shoulders in the past but she is not sure which one.  She has not had any surgery on her shoulders.  She is right-handed.  She reports significant decreased range of motion secondary to pain and really can only keep her arm at her side.  She has tried aspirin as well as tramadol without improvement of symptoms.  Blood pressure is elevated.  She reports that she has not yet taken her antihypertensive medication today.  Denies any chest pain, shortness of breath, headache, vision change, dizziness.  She attributes this to severe pain that she is send.  She does have the ability to monitor her blood pressure at home.    Past Medical History:  Diagnosis Date   Arthritis    left knee,right hip   Back pain    Cardiac arrest (HCC)    CHF (congestive heart failure) ([place])    Constipation    Depression    Diabetes mellitus    GERD (gastroesophageal reflux disease)    Glaucoma    Glaucoma    H/O cardiac arrest 11/2011   PEA   Heart murmur    Hyperlipidemia    Hypertension    Joint pain    Myocardial infarction [facility]) 2013   Palpitations    Sleep apnea    Bring machine,mask and tubing   Swelling     Patient Active Problem List   Diagnosis Date Noted   Iron deficiency anemia secondary to inadequate dietary iron intake 10/30/2022   BMI 50.0-59.9, adult ([place]) 10/30/2022   Obesity, Beginning BMI 59.14 10/30/2022   Dehydration 09/12/2022    Allergic rhinitis due to pollen 03/02/2022   Atherosclerotic heart disease of native coronary artery without angina pectoris 03/02/2022   Chronic venous stasis dermatitis 03/02/2022   Decreased estrogen level 03/02/2022   Long term (current) use of insulin ([place]) 03/02/2022   Neuropathy 03/02/2022   Peripheral venous insufficiency 03/02/2022   Varicose veins of lower extremity with inflammation 03/02/2022   Plantar flexed metatarsal bone of left foot 06/26/2021   Plantar flexed metatarsal bone of right foot 06/26/2021   Diabetes mellitus ([place]) 06/20/2021   Vitamin D deficiency 05/15/2021   Detrusor muscle hypertonia 11/04/2019   Disorder of mitral valve 11/04/2019   Hypertension associated with type 2 diabetes mellitus ([place]) 11/04/2019   Gastro-esophageal reflux disease without esophagitis 11/04/2019   Hay fever 11/04/2019   Pure hypercholesterolemia 11/04/2019   Type 2 diabetes mellitus with hyperglycemia ([place]) 11/04/2019   Hypokalemia 06/25/2019   History of cardiomyopathy 06/25/2019   Pain due to onychomycosis of toenails of both feet 03/18/2019   Porokeratosis 03/18/2019   Acute right lumbar radiculopathy 07/25/2017   At risk for adverse drug event 07/16/2017   Humerus fracture 07/12/2017   Morbid obesity due to excess calories ([place]) 99991111   Chronic systolic heart failure ([place]) 12/14/2015   Chest pain 12/14/2015  Angina decubitus 12/14/2015   Class 3 severe obesity with serious comorbidity and body mass index (BMI) of 50.0 to 59.9 in adult ([place]) 01/06/2013   Arthritis of knee, degenerative 01/06/2013   Osteoarthritis of both knees 01/06/2013   OSA (obstructive sleep apnea) 11/30/2011   Cardiac arrest ([place]) 11/13/2011   Diabetes mellitus type 2, uncontrolled 11/13/2011    Past Surgical History:  Procedure Laterality Date   BACK SURGERY     COLONOSCOPY N/A 09/12/2016   Procedure: COLONOSCOPY;  Surgeon: Clarene Essex, MD;  Location: [facility];  Service: Endoscopy;   Laterality: N/A;   CYSTOSCOPY W/ URETERAL STENT PLACEMENT Left 01/04/2013   Procedure: CYSTOSCOPY WITH RETROGRADE PYELOGRAM/URETERAL STENT PLACEMENT;  Surgeon: Bernestine Amass, MD;  Location: [facility] ORS;  Service: Urology;  Laterality: Left;   CYSTOSCOPY WITH RETROGRADE PYELOGRAM, URETEROSCOPY AND STENT PLACEMENT Left 01/26/2013   Procedure: CYSTOSCOPY, JJ STENT REMOVAL, LEFT URETEROSCOPY, ;  Surgeon: Bernestine Amass, MD;  Location: [facility] ORS;  Service: Urology;  Laterality: Left;  CYSTO, JJ STENT REMOVAL, LEFT URETEROSCOPY    FOOT SURGERY     HERNIA REPAIR     HOLMIUM LASER APPLICATION Left [id number]   Procedure: HOLMIUM LASER LITHOTRIPSY ;  Surgeon: Bernestine Amass, MD;  Location: [facility];  Service: Urology;  Laterality: Left;   KNEE SURGERY     left   PARTIAL HYSTERECTOMY      OB History     Gravida  0   Para  0   Term  0   Preterm  0   AB  0   Living  0      SAB  0   IAB  0   Ectopic  0   Multiple  0   Live Births  0            Home Medications    Prior to Admission medications   Medication Sig Start Date End Date Taking? Authorizing Provider  amLODipine (NORVASC) 5 MG tablet Take 1 tablet (5 mg total) by mouth daily. 01/16/22  Yes [name], MD  aspirin EC 81 MG tablet Take 1 tablet (81 mg total) by mouth daily. Swallow whole. 06/23/20  Yes [name], MD  atorvastatin (LIPITOR) 40 MG tablet Take 1 tablet (40 mg total) by mouth daily. 01/16/22  Yes [name], MD  BD INSULIN SYRINGE U/F 31G X 5/16" 0.5 ML MISC as directed.  03/02/19  Yes [provider]  blood glucose meter kit and supplies KIT Dispense based on patient and insurance preference. Use up to four times daily as directed. (FOR ICD-9 250.00, 250.01). 09/02/17  Yes Medina-Vargas, Monina C, NP  furosemide (LASIX) 40 MG tablet Take 1 tablet (40 mg total) by mouth twice daily for 5 days only, then decrease to taking 1 tablet (40 mg total) by mouth daily thereafter. 07/18/22  Yes  [name], MD  gabapentin (NEURONTIN) 300 MG capsule Take 300 mg by mouth See admin instructions. Take 300 mg by mouth two times a day and an additional 300 mg once daily as needed for nerve pain 03/11/19  Yes [provider]  HYDROcodone-acetaminophen (NORCO/VICODIN) 5-325 MG tablet Take 1 tablet by mouth 2 (two) times daily as needed for up to 2 days. 12/07/22 12/09/22 Yes Jaide Hillenburg K, PA-C  Lancets (ONETOUCH DELICA PLUS 123XX123) MISC USE TO TEST YOUR BLOOD SUGAR 2 TIMES A DAY 11/23/19  Yes [provider]  lidocaine (LIDODERM) 5 % Place 1 patch onto  the skin daily. Remove & Discard patch within 12 hours or as directed by MD 12/07/22  Yes Grissel Tyrell K, PA-C  lisinopril (ZESTRIL) 40 MG tablet Take 1 tablet (40 mg total) by mouth daily. 01/16/22  Yes [name], MD  loratadine (CLARITIN) 10 MG tablet Take 10 mg by mouth daily. 12/07/19  Yes [provider]  metoprolol succinate (TOPROL-XL) 100 MG 24 hr tablet Take 1 tablet (100 mg total) by mouth daily before breakfast. Take with or immediately following a meal. 08/29/17  Yes Medina-Vargas, Monina C, NP  NOVOLIN N 100 UNIT/ML injection Inject 35 Units into the skin 2 (two) times daily after a meal. 35 units in the morning and 15 units at night time. 08/08/18  Yes [provider]  ONETOUCH ULTRA test strip 2 (two) times daily. 01/14/20  Yes [provider]  oxybutynin (DITROPAN) 5 MG tablet Take 1 tablet (5 mg total) by mouth daily. 08/29/17  Yes Medina-Vargas, Monina C, NP  pantoprazole (PROTONIX) 40 MG tablet Take 1 tablet (40 mg total) by mouth daily. 08/29/17  Yes Medina-Vargas, Monina C, NP  sodium chloride (OCEAN) 0.65 % SOLN nasal spray Place 2 sprays into both nostrils in the morning and at bedtime. 10/31/22  Yes [name], Eber Hong, MD  spironolactone (ALDACTONE) 25 MG tablet Take 1 tablet (25 mg total) by mouth daily. 01/16/22  Yes [name], MD  traMADol (ULTRAM) 50 MG tablet  Take 2 tablets (100 mg total) by mouth every 8 (eight) hours as needed for severe pain. 08/29/17  Yes Medina-Vargas, Monina C, NP  Vitamin D, Ergocalciferol, (DRISDOL) 1.25 MG (50000 UNIT) CAPS capsule Take 1 capsule (50,000 Units total) by mouth every 7 (seven) days. 10/30/22  Yes Starlyn Skeans, MD    Family History Family History  Problem Relation Age of Onset   Obesity Mother    Kidney disease Mother    Heart disease Mother    Hyperlipidemia Mother    Hypertension Mother    Diabetes Mother    Asthma Mother    Anxiety disorder Father    Depression Father    Heart disease Father    Hyperlipidemia Father    Hypertension Father    Diabetes Father     Social History Social History   Tobacco Use   Smoking status: Never   Smokeless tobacco: Never  Vaping Use   Vaping Use: Never used  Substance Use Topics   Alcohol use: No   Drug use: No     Allergies   Contrast media [iodinated contrast media], Empagliflozin, and Metformin   Review of Systems Review of Systems  Constitutional:  Positive for activity change. Negative for appetite change, fatigue and fever.  Respiratory:  Negative for shortness of breath.   Cardiovascular:  Negative for chest pain, palpitations and leg swelling.  Gastrointestinal:  Negative for abdominal pain, diarrhea, nausea and vomiting.  Musculoskeletal:  Positive for arthralgias. Negative for myalgias.  Neurological:  Positive for weakness (Difficulty moving left arm secondary to pain). Negative for numbness.     Physical Exam Triage Vital Signs ED Triage Vitals  Enc Vitals Group     BP 12/07/22 1154 (!) 173/98     Pulse Rate 12/07/22 1154 74     Resp 12/07/22 1154 20     Temp 12/07/22 1154 98.2 F (36.8 C)     Temp Source 12/07/22 1154 Oral     SpO2 12/07/22 1154 94 %     Weight --  Height --      Head Circumference --      Peak Flow --      Pain Score 12/07/22 1151 8     Pain Loc --      Pain Edu? --      Excl. in [place]? --     No data found.  Updated Vital Signs BP (!) 170/99 (BP Location: Right Arm) Comment: pt hasnt taken meds today  Pulse 74   Temp 98.2 F (36.8 C) (Oral)   Resp 20   SpO2 94%   Visual Acuity Right Eye Distance:   Left Eye Distance:   Bilateral Distance:    Right Eye Near:   Left Eye Near:    Bilateral Near:     Physical Exam Vitals reviewed.  Constitutional:      General: She is awake. She is not in acute distress.    Appearance: Normal appearance. She is well-developed. She is not ill-appearing.     Comments: Very pleasant female appears stated age in no acute distress  HENT:     Head: Normocephalic and atraumatic.  Cardiovascular:     Rate and Rhythm: Normal rate and regular rhythm.     Heart sounds: Normal heart sounds, S1 normal and S2 normal. No murmur heard. Pulmonary:     Effort: Pulmonary effort is normal.     Breath sounds: Normal breath sounds. No wheezing, rhonchi or rales.     Comments: Clear to auscultation bilaterally Chest:     Chest wall: No deformity, swelling or tenderness.  Abdominal:     General: Bowel sounds are normal.     Palpations: Abdomen is soft.     Tenderness: There is no abdominal tenderness. There is no right CVA tenderness, left CVA tenderness, guarding or rebound.  Musculoskeletal:     Left shoulder: Tenderness and bony tenderness present. No swelling. Decreased range of motion. Decreased strength. Normal pulse.     Comments: Left shoulder/arm: Significant tenderness palpation over AC joint and along humeral head.  No deformity noted.  Normal pincer and grip strength at hand.  Hand neurovascularly intact.  Decreased range of motion at shoulder with abduction, adduction, flexion, extension, circumduction due to pain.  Psychiatric:        Behavior: Behavior is cooperative.      UC Treatments / Results  Labs (all labs ordered are listed, but only abnormal results are displayed) Labs Reviewed - No data to  display  EKG   Radiology DG Shoulder Left  Result Date: 12/07/2022 CLINICAL DATA:  Pain, decreased range of motion EXAM: LEFT SHOULDER - 2+ VIEW COMPARISON:  None Available. FINDINGS: No fracture or dislocation is seen. Soft tissue calcifications are noted adjacent to the greater tuberosity. Degenerative changes are noted in the [facility] joint. IMPRESSION: No recent fracture or dislocation is seen in left shoulder. Soft tissue calcifications adjacent to the greater tuberosity of proximal left humerus suggests calcific bursitis. Degenerative changes are noted in left AC joint. Electronically Signed   By: Elmer Picker M.D.   On: 12/07/2022 13:01    Procedures Procedures (including critical care time)  Medications Ordered in UC Medications  acetaminophen (TYLENOL) tablet 975 mg (975 mg Oral Given 12/07/22 1258)    Initial Impression / Assessment and Plan / UC Course  I have reviewed the triage vital signs and the nursing notes.  Pertinent labs & imaging results that were available during my care of the patient were reviewed by me and considered in my medical  decision making (see chart for details).     Exam was significantly limited by patient's pain and decreased range of motion.  X-ray was obtained that showed degenerative changes and signs of calcific bursitis.  Discussed likely cause of her symptoms.  Unfortunately, she has a history of diabetes so recommended against systemic steroids.  Discussed that ideally this should be treated with intra-articular injection but we do not have these capabilities in urgent care recommended she follow-up with a specialist for further evaluation and management.  She was started on lidocaine patches to help with her symptoms.  Because her tramadol has been ineffective she was given hydrocodone but we discussed that this can be sedating and she is not to drive or drink alcohol with taking it and must take steps to avoid falls.  We will defer high-dose  anti-inflammatories for the time being given her significantly elevated blood pressure in clinic.  We discussed that this is a better treatment modality and could consider low-dose anti-inflammatories if her blood pressure was better controlled.  Recommended close follow-up with specialist and she was given contact information for sports medicine and orthopedics in the area with instruction to call and see who can get her in the fastest.  Discussed that if she has any worsening symptoms she needs to be seen immediately.  Strict return precautions given.  Blood pressure is elevated.  Patient has not taken her antihypertensive medication and is actively in pain.  Recommended that she go home and take her medication and monitor this at home.  We discussed that ideally she would take NSAIDs for her calcific bursitis, however, given her elevated blood pressure reading I do not recommend she begin these medications at this time.  She is also to avoid decongestants, caffeine, sodium.  Discussed that if she develops any chest pain, shortness of breath, headache, vision change, dizziness she needs to go to the emergency room.  Final Clinical Impressions(s) / UC Diagnoses   Final diagnoses:  Calcific bursitis of shoulder  Elevated blood pressure reading in office with diagnosis of hypertension     Discharge Instructions      Your x-ray showed that there is arthritis and something called calcific bursitis that is likely causing your pain.  Ideally, I would like you to follow-up with a specialist as they can give you an injection of steroids in this area that can help with the pain and inflammation.  Call sports medicine or orthopedics to see who can get you in the fastest.  I have called in a few doses of hydrocodone to help with your pain in the meantime.  This should be in place of your tramadol and they should not be taken together.  You should use this medication as infrequently as possible as it can cause  side effects including addiction.  Use it only when you are going to be at home and sitting down for a while as it can increase your risk for falls.  I have also called in lidocaine patches to help with your symptoms.  If anything worsens you should go to the emergency room.     ED Prescriptions     Medication Sig Dispense Auth. Provider   lidocaine (LIDODERM) 5 % Place 1 patch onto the skin daily. Remove & Discard patch within 12 hours or as directed by MD 10 patch Yazlynn Birkeland K, PA-C   HYDROcodone-acetaminophen (NORCO/VICODIN) 5-325 MG tablet Take 1 tablet by mouth 2 (two) times daily as needed for up to  2 days. 4 tablet Ella Golomb K, PA-C      I have reviewed the PDMP during this encounter.   Terrilee Croak, PA-C 12/07/22 1319

## 2022-12-07 NOTE — Discharge Instructions (Signed)
Your x-ray showed that there is arthritis and something called calcific bursitis that is likely causing your pain.  Ideally, I would like you to follow-up with a specialist as they can give you an injection of steroids in this area that can help with the pain and inflammation.  Call sports medicine or orthopedics to see who can get you in the fastest.  I have called in a few doses of hydrocodone to help with your pain in the meantime.  This should be in place of your tramadol and they should not be taken together.  You should use this medication as infrequently as possible as it can cause side effects including addiction.  Use it only when you are going to be at home and sitting down for a while as it can increase your risk for falls.  I have also called in lidocaine patches to help with your symptoms.  If anything worsens you should go to the emergency room.

## 2022-12-10 DIAGNOSIS — M7532 Calcific tendinitis of left shoulder: Secondary | ICD-10-CM | POA: Diagnosis not present

## 2022-12-11 ENCOUNTER — Ambulatory Visit (INDEPENDENT_AMBULATORY_CARE_PROVIDER_SITE_OTHER): Payer: HMO | Admitting: Family Medicine

## 2022-12-17 ENCOUNTER — Ambulatory Visit: Payer: PPO | Admitting: Nurse Practitioner

## 2023-01-10 ENCOUNTER — Other Ambulatory Visit: Payer: Self-pay

## 2023-01-10 DIAGNOSIS — I5023 Acute on chronic systolic (congestive) heart failure: Secondary | ICD-10-CM

## 2023-01-10 DIAGNOSIS — Z794 Long term (current) use of insulin: Secondary | ICD-10-CM

## 2023-01-10 DIAGNOSIS — R600 Localized edema: Secondary | ICD-10-CM

## 2023-01-10 DIAGNOSIS — Z79899 Other long term (current) drug therapy: Secondary | ICD-10-CM

## 2023-01-10 DIAGNOSIS — I5020 Unspecified systolic (congestive) heart failure: Secondary | ICD-10-CM

## 2023-01-10 MED ORDER — FUROSEMIDE 40 MG PO TABS: 40.0000 mg | ORAL_TABLET | Freq: Every day | ORAL | 7 refills | Status: DC

## 2023-01-22 ENCOUNTER — Ambulatory Visit (INDEPENDENT_AMBULATORY_CARE_PROVIDER_SITE_OTHER): Payer: PPO | Admitting: Family Medicine

## 2023-01-22 ENCOUNTER — Encounter (INDEPENDENT_AMBULATORY_CARE_PROVIDER_SITE_OTHER): Payer: Self-pay | Admitting: Family Medicine

## 2023-01-22 VITALS — BP 132/72 | HR 67 | Temp 97.6°F | Ht 62.0 in | Wt 296.0 lb

## 2023-01-22 DIAGNOSIS — Z6841 Body Mass Index (BMI) 40.0 and over, adult: Secondary | ICD-10-CM

## 2023-01-22 DIAGNOSIS — R5383 Other fatigue: Secondary | ICD-10-CM | POA: Diagnosis not present

## 2023-01-22 DIAGNOSIS — Z794 Long term (current) use of insulin: Secondary | ICD-10-CM

## 2023-01-22 DIAGNOSIS — E669 Obesity, unspecified: Secondary | ICD-10-CM

## 2023-01-22 DIAGNOSIS — E1165 Type 2 diabetes mellitus with hyperglycemia: Secondary | ICD-10-CM | POA: Diagnosis not present

## 2023-01-22 DIAGNOSIS — E559 Vitamin D deficiency, unspecified: Secondary | ICD-10-CM | POA: Diagnosis not present

## 2023-01-22 MED ORDER — VITAMIN D (ERGOCALCIFEROL) 1.25 MG (50000 UNIT) PO CAPS: 50000.0000 [IU] | ORAL_CAPSULE | ORAL | 0 refills | Status: DC

## 2023-01-22 NOTE — Progress Notes (Unsigned)
Chief Complaint:   OBESITY Whitney [name] is here to discuss her progress with her obesity treatment plan along with follow-up of her obesity related diagnoses. Whitney [name] is on the Category 2 Plan and states she is following her eating plan approximately 0% of the time. Whitney [name] states she is doing 0 minutes 0 times per week.  Today's visit was #: 21 Starting weight: 313 lbs Starting date: 04/06/2021 Today's weight: 296 lbs Today's date: 01/22/2023 Total lbs lost to date: 17 Total lbs lost since last in-office visit: 0  Interim History: Nargis's last visit was approximately 3 months ago.  She has not been following her plan and she has been feeling very defeated, but she still wants to lose weight.  She does not want weight loss surgery.  She has questions about OTC diet supplements.  Subjective:   1. Type 2 diabetes mellitus with hyperglycemia, with long-term current use of insulin Whitney [name]'s last A1c was elevated.  She is not on a GLP-1 due to lack of insurance coverage.  Her PCP manages her diabetes mellitus but we have no record of a recent A1c.  2. Vitamin D deficiency Whitney [name] has been out of of vitamin D over the last month, and she notes fatigue.  3. Other fatigue Whitney [name] notes increased fatigue, and she has no recent TSH.  Assessment/Plan:   1. Type 2 diabetes mellitus with hyperglycemia, with long-term current use of insulin We will check labs today.  Whitney [name] will continue to work on her diet and weight loss efforts.  - Hemoglobin A1c - CMP14+EGFR - Microalbumin / creatinine urine ratio  2. Vitamin D deficiency We will check labs today, and we will refill prescription vitamin D 50,000 IU once weekly for 1 month.  - VITAMIN D 25 Hydroxy (Vit-D Deficiency, Fractures) - Vitamin D, Ergocalciferol, (DRISDOL) 1.25 MG (50000 UNIT) CAPS capsule; Take 1 capsule (50,000 Units total) by mouth every 7 (seven) days.  Dispense: 4 capsule; Refill: 0  3. Other fatigue We will check TSH today, and we  will follow-up at Whitney [name]'s next visit.  4. BMI 50.0-59.9, adult  5. Obesity, Beginning BMI 59.14 Whitney [name] is currently in the action stage of change. As such, her goal is to get back to weightloss efforts . She has agreed to get back to the Category 2 Plan and eat everything.   Whitney [name] was educated against diet supplements which are either ineffective or has side effects that are especially dangerous with her health history and current medications.  Behavioral modification strategies: increasing lean protein intake and no skipping meals.  Whitney [name] has agreed to follow-up with our clinic in 4 weeks. She was informed of the importance of frequent follow-up visits to maximize her success with intensive lifestyle modifications for her multiple health conditions.   Whitney [name] was informed we would discuss her lab results at her next visit unless there is a critical issue that needs to be addressed sooner. Yanette agreed to keep her next visit at the agreed upon time to discuss these results.  Objective:   Blood pressure 132/72, pulse 67, temperature 97.6 F (36.4 C), height  (1.575 m), weight 296 lb (134.3 kg), SpO2 98 %. Body mass index is 54.14 kg/m.  Lab Results  Component Value Date   CREATININE 0.91 07/26/2022   BUN 30 (H) 07/26/2022   NA 141 07/26/2022   K 4.6 07/26/2022   CL 101 07/26/2022   CO2 24 07/26/2022   Lab Results  Component Value Date   ALT  11 06/26/2019   AST 11 (A) 03/29/2021   ALKPHOS 91 06/26/2019   BILITOT 1.2 06/26/2019   Lab Results  Component Value Date   HGBA1C 6.4 (H) 09/06/2021   HGBA1C 9.4 03/29/2021   HGBA1C 7.5 (H) 06/26/2019   HGBA1C 7.6 (H) 07/12/2017   HGBA1C 7.6 (H) 11/16/2011   No results found for: "INSULIN" Lab Results  Component Value Date   TSH 3.348 11/16/2011   Lab Results  Component Value Date   CHOL 184 03/29/2021   HDL 52 03/29/2021   LDLCALC 109 03/29/2021   TRIG 127 03/29/2021   Lab Results  Component Value Date   VD25OH 38.5  10/30/2022   VD25OH 49.4 02/15/2022   VD25OH 39.6 09/06/2021   Lab Results  Component Value Date   WBC 10.3 10/30/2022   HGB 10.4 (L) 10/30/2022   HCT 31.1 (L) 10/30/2022   MCV 81 10/30/2022   PLT 254 10/30/2022   Lab Results  Component Value Date   IRON 68 11/16/2011   TIBC 302 11/16/2011   FERRITIN 253 11/16/2011   Attestation Statements:   Reviewed by clinician on day of visit: allergies, medications, problem list, medical history, surgical history, family history, social history, and previous encounter notes.   I, Burt Knack, am acting as transcriptionist for Quillian Quince, MD.  I have reviewed the above documentation for accuracy and completeness, and I agree with the above. -  Quillian Quince, MD

## 2023-01-23 LAB — CMP14+EGFR
ALT: 13 IU/L (ref 0–32)
AST: 15 IU/L (ref 0–40)
Albumin/Globulin Ratio: 1.4 (ref 1.2–2.2)
Albumin: 4.2 g/dL (ref 3.9–4.9)
Alkaline Phosphatase: 148 IU/L — ABNORMAL HIGH (ref 44–121)
BUN/Creatinine Ratio: 29 — ABNORMAL HIGH (ref 12–28)
BUN: 28 mg/dL — ABNORMAL HIGH (ref 8–27)
Bilirubin Total: 0.6 mg/dL (ref 0.0–1.2)
CO2: 21 mmol/L (ref 20–29)
Calcium: 9.7 mg/dL (ref 8.7–10.3)
Chloride: 104 mmol/L (ref 96–106)
Creatinine, Ser: 0.95 mg/dL (ref 0.57–1.00)
Globulin, Total: 3.1 g/dL (ref 1.5–4.5)
Glucose: 129 mg/dL — ABNORMAL HIGH (ref 70–99)
Potassium: 4.7 mmol/L (ref 3.5–5.2)
Sodium: 142 mmol/L (ref 134–144)
Total Protein: 7.3 g/dL (ref 6.0–8.5)
eGFR: 65 mL/min/{1.73_m2} (ref >59)

## 2023-01-23 LAB — VITAMIN D 25 HYDROXY (VIT D DEFICIENCY, FRACTURES): Vit D, 25-Hydroxy: 35.6 ng/mL (ref 30.0–100.0)

## 2023-01-23 LAB — MICROALBUMIN / CREATININE URINE RATIO
Creatinine, Urine: 60.1 mg/dL
Microalb/Creat Ratio: 724 mg/g creat — ABNORMAL HIGH (ref 0–29)
Microalbumin, Urine: 435 ug/mL

## 2023-01-23 LAB — HEMOGLOBIN A1C
Est. average glucose Bld gHb Est-mCnc: 192 mg/dL
Hgb A1c MFr Bld: 8.3 % — ABNORMAL HIGH (ref 4.8–5.6)

## 2023-02-07 ENCOUNTER — Other Ambulatory Visit: Payer: Self-pay

## 2023-02-07 DIAGNOSIS — I10 Essential (primary) hypertension: Secondary | ICD-10-CM

## 2023-02-07 MED ORDER — AMLODIPINE BESYLATE 5 MG PO TABS: 5.0000 mg | ORAL_TABLET | Freq: Every day | ORAL | 1 refills | Status: DC

## 2023-02-19 ENCOUNTER — Ambulatory Visit (INDEPENDENT_AMBULATORY_CARE_PROVIDER_SITE_OTHER): Payer: PPO | Admitting: Family Medicine

## 2023-02-28 ENCOUNTER — Ambulatory Visit: Payer: PPO | Admitting: Podiatry

## 2023-03-01 DIAGNOSIS — I2510 Atherosclerotic heart disease of native coronary artery without angina pectoris: Secondary | ICD-10-CM | POA: Diagnosis not present

## 2023-03-01 DIAGNOSIS — I5022 Chronic systolic (congestive) heart failure: Secondary | ICD-10-CM | POA: Diagnosis not present

## 2023-03-01 DIAGNOSIS — I10 Essential (primary) hypertension: Secondary | ICD-10-CM | POA: Diagnosis not present

## 2023-03-01 DIAGNOSIS — M170 Bilateral primary osteoarthritis of knee: Secondary | ICD-10-CM | POA: Diagnosis not present

## 2023-03-01 DIAGNOSIS — E7800 Pure hypercholesterolemia, unspecified: Secondary | ICD-10-CM | POA: Diagnosis not present

## 2023-03-01 DIAGNOSIS — E1169 Type 2 diabetes mellitus with other specified complication: Secondary | ICD-10-CM | POA: Diagnosis not present

## 2023-03-01 DIAGNOSIS — K219 Gastro-esophageal reflux disease without esophagitis: Secondary | ICD-10-CM | POA: Diagnosis not present

## 2023-03-06 ENCOUNTER — Other Ambulatory Visit: Payer: Self-pay

## 2023-03-06 MED ORDER — ATORVASTATIN CALCIUM 40 MG PO TABS: 40.0000 mg | ORAL_TABLET | Freq: Every day | ORAL | 1 refills | Status: DC

## 2023-03-06 MED ORDER — LISINOPRIL 40 MG PO TABS: 40.0000 mg | ORAL_TABLET | Freq: Every day | ORAL | 1 refills | Status: AC

## 2023-03-06 NOTE — Addendum Note (Signed)
Addended by: Margaret Pyle D on: 03/06/2023 04:38 PM   Modules accepted: Orders

## 2023-03-07 ENCOUNTER — Other Ambulatory Visit: Payer: Self-pay

## 2023-03-07 DIAGNOSIS — I5022 Chronic systolic (congestive) heart failure: Secondary | ICD-10-CM

## 2023-03-07 MED ORDER — SPIRONOLACTONE 25 MG PO TABS: 25.0000 mg | ORAL_TABLET | Freq: Every day | ORAL | 1 refills | Status: DC

## 2023-03-20 DIAGNOSIS — E7800 Pure hypercholesterolemia, unspecified: Secondary | ICD-10-CM | POA: Diagnosis not present

## 2023-03-20 DIAGNOSIS — I5030 Unspecified diastolic (congestive) heart failure: Secondary | ICD-10-CM | POA: Diagnosis not present

## 2023-03-20 DIAGNOSIS — Z6841 Body Mass Index (BMI) 40.0 and over, adult: Secondary | ICD-10-CM | POA: Diagnosis not present

## 2023-03-20 DIAGNOSIS — E261 Secondary hyperaldosteronism: Secondary | ICD-10-CM | POA: Diagnosis not present

## 2023-03-20 DIAGNOSIS — E1165 Type 2 diabetes mellitus with hyperglycemia: Secondary | ICD-10-CM | POA: Diagnosis not present

## 2023-03-21 ENCOUNTER — Encounter (INDEPENDENT_AMBULATORY_CARE_PROVIDER_SITE_OTHER): Payer: Self-pay | Admitting: Family Medicine

## 2023-03-21 ENCOUNTER — Ambulatory Visit (INDEPENDENT_AMBULATORY_CARE_PROVIDER_SITE_OTHER): Payer: PPO | Admitting: Family Medicine

## 2023-03-21 VITALS — BP 130/65 | HR 68 | Temp 98.1°F | Ht 62.0 in | Wt 302.0 lb

## 2023-03-21 DIAGNOSIS — E1169 Type 2 diabetes mellitus with other specified complication: Secondary | ICD-10-CM | POA: Diagnosis not present

## 2023-03-21 DIAGNOSIS — E669 Obesity, unspecified: Secondary | ICD-10-CM

## 2023-03-21 DIAGNOSIS — Z6841 Body Mass Index (BMI) 40.0 and over, adult: Secondary | ICD-10-CM | POA: Diagnosis not present

## 2023-03-21 DIAGNOSIS — E559 Vitamin D deficiency, unspecified: Secondary | ICD-10-CM

## 2023-03-21 DIAGNOSIS — Z794 Long term (current) use of insulin: Secondary | ICD-10-CM | POA: Diagnosis not present

## 2023-03-21 MED ORDER — VITAMIN D (ERGOCALCIFEROL) 1.25 MG (50000 UNIT) PO CAPS: 50000.0000 [IU] | ORAL_CAPSULE | ORAL | 0 refills | Status: DC

## 2023-03-26 NOTE — Progress Notes (Signed)
Chief Complaint:   OBESITY Whitney [name] is here to discuss her progress with her obesity treatment plan along with follow-up of her obesity related diagnoses. Whitney [name] is on the Category 2 Plan and states she is following her eating plan approximately 50% of the time. Whitney [name] states she is doing 0 minutes 0 times per week.  Today's visit was #: 22 Starting weight: 313 lbs Starting date: 04/06/2021 Today's weight: 302 lbs Today's date: 03/21/2023 Total lbs lost to date: 11 Total lbs lost since last in-office visit: 0  Interim History: Patient has been traveling and has been off her eating plan.  She is working on getting back on track and trying to decrease simple carbohydrates.  Subjective:   1. Vitamin D deficiency Patient is stable on vitamin D prescription with no side effects noted.  2. Type 2 diabetes mellitus with other specified complication, with long-term current use of insulin (HCC) Patient is followed by her PCP for diabetes.  Her recent A1c was 8.3.  She declines GLP-1.  She is working on decreasing simple carbohydrates and she is trying to increase lean protein.  Assessment/Plan:   1. Vitamin D deficiency Patient will continue prescription vitamin D once weekly, and we will refill for 1 month.  - Vitamin D, Ergocalciferol, (DRISDOL) 1.25 MG (50000 UNIT) CAPS capsule; Take 1 capsule (50,000 Units total) by mouth every 7 (seven) days.  Dispense: 4 capsule; Refill: 0  2. Type 2 diabetes mellitus with other specified complication, with long-term current use of insulin (HCC) Patient is to follow-up with her PCP for medications.  We will continue to work on improving glucose.  3. BMI 50.0-59.9, adult (HCC)  4. Obesity, Beginning BMI 59.14 Whitney [name] is currently in the action stage of change. As such, her goal is to continue with weight loss efforts. She has agreed to the Category 2 Plan.   Exercise goals: All adults should avoid inactivity. Some physical activity is better than  none, and adults who participate in any amount of physical activity gain some health benefits.  Behavioral modification strategies: increasing lean protein intake.  Whitney [name] has agreed to follow-up with our clinic in 4 weeks. She was informed of the importance of frequent follow-up visits to maximize her success with intensive lifestyle modifications for her multiple health conditions.   Objective:   Blood pressure 130/65, pulse 68, temperature 98.1 F (36.7 C), height 5\' 2"  (1.575 m), weight (!) 302 lb (137 kg), SpO2 97 %. Body mass index is 55.24 kg/m.  Lab Results  Component Value Date   CREATININE 0.95 01/22/2023   BUN 28 (H) 01/22/2023   NA 142 01/22/2023   K 4.7 01/22/2023   CL 104 01/22/2023   CO2 21 01/22/2023   Lab Results  Component Value Date   ALT 13 01/22/2023   AST 15 01/22/2023   ALKPHOS 148 (H) 01/22/2023   BILITOT 0.6 01/22/2023   Lab Results  Component Value Date   HGBA1C 8.3 (H) 01/22/2023   HGBA1C 6.4 (H) 09/06/2021   HGBA1C 9.4 03/29/2021   HGBA1C 7.5 (H) 06/26/2019   HGBA1C 7.6 (H) 07/12/2017   No results found for: "INSULIN" Lab Results  Component Value Date   TSH 3.348 11/16/2011   Lab Results  Component Value Date   CHOL 184 03/29/2021   HDL 52 03/29/2021   LDLCALC 109 03/29/2021   TRIG 127 03/29/2021   Lab Results  Component Value Date   VD25OH 35.6 01/22/2023   VD25OH 38.5 10/30/2022  VD25OH 49.4 02/15/2022   Lab Results  Component Value Date   WBC 10.3 10/30/2022   HGB 10.4 (L) 10/30/2022   HCT 31.1 (L) 10/30/2022   MCV 81 10/30/2022   PLT 254 10/30/2022   Lab Results  Component Value Date   IRON 68 11/16/2011   TIBC 302 11/16/2011   FERRITIN 253 11/16/2011   Attestation Statements:   Reviewed by clinician on day of visit: allergies, medications, problem list, medical history, surgical history, family history, social history, and previous encounter notes.  I have personally spent 42 minutes total time today in  preparation, patient care, and documentation for this visit, including the following: review of clinical lab tests; review of medical tests/procedures/services.  I, Burt Knack, am acting as transcriptionist for Quillian Quince, MD.  I have reviewed the above documentation for accuracy and completeness, and I agree with the above. -  Quillian Quince, MD

## 2023-04-12 ENCOUNTER — Other Ambulatory Visit (INDEPENDENT_AMBULATORY_CARE_PROVIDER_SITE_OTHER): Payer: Self-pay | Admitting: Family Medicine

## 2023-04-12 DIAGNOSIS — E559 Vitamin D deficiency, unspecified: Secondary | ICD-10-CM

## 2023-04-16 ENCOUNTER — Other Ambulatory Visit (INDEPENDENT_AMBULATORY_CARE_PROVIDER_SITE_OTHER): Payer: Self-pay | Admitting: Family Medicine

## 2023-04-16 DIAGNOSIS — E559 Vitamin D deficiency, unspecified: Secondary | ICD-10-CM

## 2023-04-23 ENCOUNTER — Ambulatory Visit (INDEPENDENT_AMBULATORY_CARE_PROVIDER_SITE_OTHER): Payer: PPO | Admitting: Family Medicine

## 2023-05-07 ENCOUNTER — Encounter (HOSPITAL_COMMUNITY): Payer: Self-pay

## 2023-05-07 ENCOUNTER — Ambulatory Visit (HOSPITAL_COMMUNITY)
Admission: EM | Admit: 2023-05-07 | Discharge: 2023-05-07 | Disposition: A | Discharge status: Home / Self Care | Payer: PPO | Attending: Family Medicine | Admitting: Family Medicine

## 2023-05-07 DIAGNOSIS — Z794 Long term (current) use of insulin: Secondary | ICD-10-CM | POA: Diagnosis not present

## 2023-05-07 DIAGNOSIS — R809 Proteinuria, unspecified: Secondary | ICD-10-CM

## 2023-05-07 DIAGNOSIS — E1165 Type 2 diabetes mellitus with hyperglycemia: Secondary | ICD-10-CM | POA: Diagnosis not present

## 2023-05-07 DIAGNOSIS — R600 Localized edema: Secondary | ICD-10-CM | POA: Diagnosis not present

## 2023-05-07 LAB — POCT URINALYSIS DIP (MANUAL ENTRY)
Bilirubin, UA: NEGATIVE
Glucose, UA: 500 mg/dL — AB
Ketones, POC UA: NEGATIVE mg/dL
Leukocytes, UA: NEGATIVE
Nitrite, UA: NEGATIVE
Protein Ur, POC: >=300 mg/dL — AB
Spec Grav, UA: 1.025 (ref 1.010–1.025)
Urobilinogen, UA: 4 E.U./dL — AB
pH, UA: 6 (ref 5.0–8.0)

## 2023-05-07 LAB — CBC
HCT: 34.1 % — ABNORMAL LOW (ref 36.0–46.0)
Hemoglobin: 10.3 g/dL — ABNORMAL LOW (ref 12.0–15.0)
MCH: 26.1 pg (ref 26.0–34.0)
MCHC: 30.2 g/dL (ref 30.0–36.0)
MCV: 86.5 fL (ref 80.0–100.0)
Platelets: 301 10*3/uL (ref 150–400)
RBC: 3.94 MIL/uL (ref 3.87–5.11)
RDW: 14.6 % (ref 11.5–15.5)
WBC: 9.7 10*3/uL (ref 4.0–10.5)
nRBC: 0 % (ref 0.0–0.2)

## 2023-05-07 LAB — COMPREHENSIVE METABOLIC PANEL
ALT: 18 U/L (ref 0–44)
AST: 16 U/L (ref 15–41)
Albumin: 3.4 g/dL — ABNORMAL LOW (ref 3.5–5.0)
Alkaline Phosphatase: 121 U/L (ref 38–126)
Anion gap: 8 (ref 5–15)
BUN: 17 mg/dL (ref 8–23)
CO2: 29 mmol/L (ref 22–32)
Calcium: 9.6 mg/dL (ref 8.9–10.3)
Chloride: 105 mmol/L (ref 98–111)
Creatinine, Ser: 1.03 mg/dL — ABNORMAL HIGH (ref 0.44–1.00)
GFR, Estimated: 59 mL/min — ABNORMAL LOW (ref >60)
Glucose, Bld: 147 mg/dL — ABNORMAL HIGH (ref 70–99)
Potassium: 4.2 mmol/L (ref 3.5–5.1)
Sodium: 142 mmol/L (ref 135–145)
Total Bilirubin: 1 mg/dL (ref 0.3–1.2)
Total Protein: 7.3 g/dL (ref 6.5–8.1)

## 2023-05-07 LAB — POCT FASTING CBG KUC MANUAL ENTRY: POCT Glucose (KUC): 174 mg/dL — AB (ref 70–99)

## 2023-05-07 NOTE — ED Triage Notes (Signed)
Pt presents with c/o bilateral leg swelling x 3 days. States she has also had some pain, has applied Vaseline gel.

## 2023-05-07 NOTE — Discharge Instructions (Signed)
Swelling happens when fluid collects in small spaces around tissues and organs inside the body. Another word for swelling is "edema." Some common parts of the body where people can have swelling are the lower legs or hands. This typically is worse in the areas of the body that are closest to the ground (because of gravity)  Symptoms of swelling can include puffiness of the skin, which can cause the skin to look stretched and shiny. This often occurs with swelling in the lower legs and can be worse after you sit or stand for a long time.  Treatment of edema includes several components: treatment of the underlying cause (if possible), reducing the amount of salt (sodium) in your diet, and, in many cases, use of a medication called a diuretic to eliminate excess fluid. Using compression stockings and elevating the legs may also be recommended.   You have had labs (blood tests) sent today. We will call you with any significant abnormalities or if there is need to begin or change treatment or pursue further follow up.  You may also review your test results online through MyChart. If you do not have a MyChart account, instructions to sign up should be on your discharge paperwork.

## 2023-05-08 NOTE — ED Provider Notes (Signed)
[facility]   [id number] 05/07/23 Arrival Time: 1620  ASSESSMENT & PLAN:  1. Bilateral lower extremity edema   2. Proteinuria, unspecified type   3. Type 2 diabetes mellitus with hyperglycemia, with long-term current use of insulin (HCC)    Has not taken Lasix today. Will leave it up to her if she wants to take a dose this late in the day. Otherwise will take 60mg  tomorrow am then return to her 40mg  daily. Ques nephrotic syndrome.  Awaiting CMP/CBC.  Has PCP f/u in 9/24. Will call them tomorrow to see if they have an earlier appt. Elevate legs.  Agrees to ED evaluation should her legs worsen over the next couple of days.  Reviewed expectations re: course of current medical issues. Questions answered. Outlined signs and symptoms indicating need for more acute intervention. Patient verbalized understanding. After Visit Summary given.   SUBJECTIVE:  History from: patient. Whitney [name] is a 67 y.o. female who presents with complaint of bilateral leg swelling x 3 days. States she has also had some pain, has applied Vaseline gel. Takes Lasix but admits to missing days sometimes. Recent death in the family; distracted. Denies CP/SOB. H/O LE edema.  Social History   Tobacco Use  Smoking Status Never  Smokeless Tobacco Never   Social History   Substance and Sexual Activity  Alcohol Use No   OBJECTIVE:  Vitals:   05/07/23 1716  BP: (!) 177/84  Pulse: (!) 58  Resp: 16  Temp: 98.1 F (36.7 C)  TempSrc: Oral  SpO2: 96%    General appearance: alert, oriented, no acute distress Lungs: without labored respirations; speaks full sentences without difficulty; CTAB Heart: regular Abdomen: soft, obese Extremities: with 1+ pitting edema bilaterally; chronic venous stasis changes bilaterally; no specific calf tenderness Skin: warm and dry Psychological: alert and cooperative; normal mood and affect  Labs:  Labs Reviewed  POCT FASTING CBG K[facility] MANUAL ENTRY -  Abnormal; Notable for the following components:   POCT Glucose (K[facility]) 174 (*)    All other components within normal limits  POCT URINALYSIS DIP (MANUAL ENTRY) - Abnormal; Notable for the following components:   Clarity, UA cloudy (*)    Glucose, UA =500 (*)    Blood, UA trace-intact (*)    Protein Ur, POC >=300 (*)    Urobilinogen, UA 4.0 (*)    All other components within normal limits     Allergies  Allergen Reactions   Contrast Media [Iodinated Contrast Media] Anaphylaxis   Empagliflozin Other (See Comments)   Metformin Other (See Comments)    Past Medical History:  Diagnosis Date   Arthritis    left knee,right hip   Back pain    Cardiac arrest (HCC)    CHF (congestive heart failure) (HCC)    Constipation    Depression    Diabetes mellitus    GERD (gastroesophageal reflux disease)    Glaucoma    Glaucoma    H/O cardiac arrest 11/2011   PEA   Heart murmur    Hyperlipidemia    Hypertension    Joint pain    Myocardial infarction [facility]) 2013   Palpitations    Sleep apnea    Bring machine,mask and tubing   Swelling    Social History   Socioeconomic History   Marital status: Divorced    Spouse name: Not on file   Number of children: Not on file   Years of education: Not on file   Highest education level: Not on  file  Occupational History   Occupation: Retired  Tobacco Use   Smoking status: Never   Smokeless tobacco: Never  Vaping Use   Vaping status: Never Used  Substance and Sexual Activity   Alcohol use: No   Drug use: No   Sexual activity: Never  Other Topics Concern   Not on file  Social History Narrative   Not on file   Social Determinants of Health   Financial Resource Strain: Not on file  Food Insecurity: No Food Insecurity (07/03/2019)   Hunger Vital Sign    Worried About Running Out of Food in the Last Year: Never true    Ran Out of Food in the Last Year: Never true  Transportation Needs: No Transportation Needs (07/03/2019)   PRAPARE -  Administrator, Civil Service (Medical): No    Lack of Transportation (Non-Medical): No  Physical Activity: Not on file  Stress: Not on file  Social Connections: Not on file  Intimate Partner Violence: Not on file   Family History  Problem Relation Age of Onset   Obesity Mother    Kidney disease Mother    Heart disease Mother    Hyperlipidemia Mother    Hypertension Mother    Diabetes Mother    Asthma Mother    Anxiety disorder Father    Depression Father    Heart disease Father    Hyperlipidemia Father    Hypertension Father    Diabetes Father    Past Surgical History:  Procedure Laterality Date   BACK SURGERY     COLONOSCOPY N/A 09/12/2016   Procedure: COLONOSCOPY;  Surgeon: Vida Rigger, MD;  Location: [facility];  Service: Endoscopy;  Laterality: N/A;   CYSTOSCOPY W/ URETERAL STENT PLACEMENT Left 01/04/2013   Procedure: CYSTOSCOPY WITH RETROGRADE PYELOGRAM/URETERAL STENT PLACEMENT;  Surgeon: Valetta Fuller, MD;  Location: [facility];  Service: Urology;  Laterality: Left;   CYSTOSCOPY WITH RETROGRADE PYELOGRAM, URETEROSCOPY AND STENT PLACEMENT Left 01/26/2013   Procedure: CYSTOSCOPY, JJ STENT REMOVAL, LEFT URETEROSCOPY, ;  Surgeon: Valetta Fuller, MD;  Location: [facility];  Service: Urology;  Laterality: Left;  CYSTO, JJ STENT REMOVAL, LEFT URETEROSCOPY    FOOT SURGERY     HERNIA REPAIR     HOLMIUM LASER APPLICATION Left 01/26/2013   Procedure: HOLMIUM LASER LITHOTRIPSY ;  Surgeon: Valetta Fuller, MD;  Location: [facility];  Service: Urology;  Laterality: Left;   KNEE SURGERY     left   PARTIAL HYSTERECTOMY        Mardella Layman, MD 05/08/23 1332

## 2023-05-09 ENCOUNTER — Ambulatory Visit (INDEPENDENT_AMBULATORY_CARE_PROVIDER_SITE_OTHER): Payer: PPO | Admitting: Family Medicine

## 2023-06-13 ENCOUNTER — Ambulatory Visit (INDEPENDENT_AMBULATORY_CARE_PROVIDER_SITE_OTHER): Payer: PPO | Admitting: Podiatry

## 2023-06-13 DIAGNOSIS — Q828 Other specified congenital malformations of skin: Secondary | ICD-10-CM

## 2023-06-13 DIAGNOSIS — E1165 Type 2 diabetes mellitus with hyperglycemia: Secondary | ICD-10-CM | POA: Diagnosis not present

## 2023-06-13 DIAGNOSIS — M79674 Pain in right toe(s): Secondary | ICD-10-CM | POA: Diagnosis not present

## 2023-06-13 DIAGNOSIS — M79675 Pain in left toe(s): Secondary | ICD-10-CM | POA: Diagnosis not present

## 2023-06-13 DIAGNOSIS — M216X1 Other acquired deformities of right foot: Secondary | ICD-10-CM

## 2023-06-13 DIAGNOSIS — B351 Tinea unguium: Secondary | ICD-10-CM

## 2023-06-13 DIAGNOSIS — M216X2 Other acquired deformities of left foot: Secondary | ICD-10-CM

## 2023-06-13 NOTE — Addendum Note (Signed)
Addended by: Helane Gunther on: 06/13/2023 11:24 AM   Modules accepted: Orders

## 2023-06-13 NOTE — Progress Notes (Signed)
This patient returns to my office for at risk foot care.  This patient requires this care by a professional since this patient will be at risk due to having diabetes.  Patient has painful corn fifth toe right foot and painful callus on the outside of both feet  B/L.  This patient is unable to cut nails herself since the patient cannot reach her nails.These nails are painful walking and wearing shoes.  This patient presents for at risk foot care today.  General Appearance  Alert, conversant and in no acute stress.  Vascular  Defeered due to wearing compression socks.  Patient has not been seen for 9 months.  Neurologic  Senn-Weinstein monofilament wire test diminished  bilaterally. Muscle power within normal limits bilaterally.  Nails Thick disfigured discolored nails with subungual debris  from hallux to fifth toes bilaterally. No evidence of bacterial infection or drainage bilaterally.  Orthopedic  No limitations of motion  feet .  No crepitus or effusions noted.  No bony pathology or digital deformities noted.  Adducto varus fifth digit  Right foot.  Plantar flexed fifth metatarsal  B/L.  Skin  normotropic skin with no porokeratosis noted bilaterally.  No signs of infections or ulcers noted. Porokeratosis sub 5th  B/L.  Corn 5th digit right foot.    Onychomycosis  Pain in right toes  Pain in left toes  Porokeratosis B/L  Corn fifth toe right foot.  Consent was obtained for treatment procedures.   Mechanical debridement of nails 1-5  bilaterally performed with a nail nipper.  Filed with dremel without incident. Debridement of callus  Both feet with # 15 blade and dremel tool.    Return office visit   3 months                   Told patient to return for periodic foot care and evaluation due to potential at risk complications.   Gardiner Barefoot DPM

## 2023-06-24 DIAGNOSIS — E7800 Pure hypercholesterolemia, unspecified: Secondary | ICD-10-CM | POA: Diagnosis not present

## 2023-06-24 DIAGNOSIS — Z1331 Encounter for screening for depression: Secondary | ICD-10-CM | POA: Diagnosis not present

## 2023-06-24 DIAGNOSIS — Z0001 Encounter for general adult medical examination with abnormal findings: Secondary | ICD-10-CM | POA: Diagnosis not present

## 2023-06-24 DIAGNOSIS — E261 Secondary hyperaldosteronism: Secondary | ICD-10-CM | POA: Diagnosis not present

## 2023-06-24 DIAGNOSIS — I110 Hypertensive heart disease with heart failure: Secondary | ICD-10-CM | POA: Diagnosis not present

## 2023-06-24 DIAGNOSIS — Z23 Encounter for immunization: Secondary | ICD-10-CM | POA: Diagnosis not present

## 2023-06-24 DIAGNOSIS — Z79899 Other long term (current) drug therapy: Secondary | ICD-10-CM | POA: Diagnosis not present

## 2023-06-24 DIAGNOSIS — E1165 Type 2 diabetes mellitus with hyperglycemia: Secondary | ICD-10-CM | POA: Diagnosis not present

## 2023-06-24 DIAGNOSIS — I5022 Chronic systolic (congestive) heart failure: Secondary | ICD-10-CM | POA: Diagnosis not present

## 2023-06-24 DIAGNOSIS — Z794 Long term (current) use of insulin: Secondary | ICD-10-CM | POA: Diagnosis not present

## 2023-06-24 DIAGNOSIS — E1142 Type 2 diabetes mellitus with diabetic polyneuropathy: Secondary | ICD-10-CM | POA: Diagnosis not present

## 2023-06-28 ENCOUNTER — Other Ambulatory Visit: Payer: PPO

## 2023-06-29 NOTE — Progress Notes (Signed)
Seen by casting department

## 2023-07-02 ENCOUNTER — Ambulatory Visit (INDEPENDENT_AMBULATORY_CARE_PROVIDER_SITE_OTHER): Payer: PPO | Admitting: Family Medicine

## 2023-07-02 ENCOUNTER — Encounter (INDEPENDENT_AMBULATORY_CARE_PROVIDER_SITE_OTHER): Payer: Self-pay | Admitting: Family Medicine

## 2023-07-02 VITALS — BP 110/65 | HR 66 | Temp 98.1°F | Ht 62.0 in | Wt 306.0 lb

## 2023-07-02 DIAGNOSIS — E1169 Type 2 diabetes mellitus with other specified complication: Secondary | ICD-10-CM

## 2023-07-02 DIAGNOSIS — Z794 Long term (current) use of insulin: Secondary | ICD-10-CM

## 2023-07-02 DIAGNOSIS — E559 Vitamin D deficiency, unspecified: Secondary | ICD-10-CM

## 2023-07-02 DIAGNOSIS — Z7984 Long term (current) use of oral hypoglycemic drugs: Secondary | ICD-10-CM | POA: Diagnosis not present

## 2023-07-02 DIAGNOSIS — Z6841 Body Mass Index (BMI) 40.0 and over, adult: Secondary | ICD-10-CM

## 2023-07-02 DIAGNOSIS — E669 Obesity, unspecified: Secondary | ICD-10-CM

## 2023-07-02 MED ORDER — VITAMIN D (ERGOCALCIFEROL) 1.25 MG (50000 UNIT) PO CAPS: 50000.0000 [IU] | ORAL_CAPSULE | ORAL | 0 refills | Status: DC

## 2023-07-02 NOTE — Progress Notes (Signed)
Chief Complaint:   OBESITY Whitney [name] is here to discuss her progress with her obesity treatment plan along with follow-up of her obesity related diagnoses. Whitney [name] is on the Category 2 Plan and states she is following her eating plan approximately 0% of the time. Whitney [name] states she is doing 0 minutes 0 times per week.  Today's visit was #: 23 Starting weight: 313 lbs Starting date: 04/06/2021 Today's weight: 306 lbs Today's date: 07/02/2023 Total lbs lost to date: 7 Total lbs lost since last in-office visit: 0  Interim History: Patient has gotten off track with her eating plan and she has gained weight.  Her last visit was 4 months ago.  She is ready to get back on track with her plan.  Subjective:   1. Type 2 diabetes mellitus with other specified complication, with long-term current use of insulin (HCC) Patient states her recent A1c was worsening at 8.3.  She cannot tolerate metformin, and she had a yeast infection on Jardiance and cannot afford Ozempic.  2. Vitamin D deficiency Patient has been off vitamin D, and her level is still low.  She notes fatigue.  Assessment/Plan:   1. Type 2 diabetes mellitus with other specified complication, with long-term current use of insulin (HCC) Patient is to work on following her Category 2 meal plan very closely, and we will follow-up at her next visit in 1 month.  2. Vitamin D deficiency Patient will continue prescription vitamin D 50,000 IU once weekly, and we will refill for 1 month.  - Vitamin D, Ergocalciferol, (DRISDOL) 1.25 MG (50000 UNIT) CAPS capsule; Take 1 capsule (50,000 Units total) by mouth every 7 (seven) days.  Dispense: 4 capsule; Refill: 0  3. BMI 50.0-59.9, adult (HCC)  4. Obesity, Beginning BMI 59.14 Whitney [name] is currently in the action stage of change. As such, her goal is to get back to weightloss efforts . She has agreed to the Category 2 Plan.   Behavioral modification strategies: increasing lean protein intake.  Whitney [name]  has agreed to follow-up with our clinic in 4 weeks. She was informed of the importance of frequent follow-up visits to maximize her success with intensive lifestyle modifications for her multiple health conditions.   Objective:   Blood pressure 110/65, pulse 66, temperature 98.1 F (36.7 C), height 5\' 2"  (1.575 m), weight (!) 306 lb (138.8 kg), SpO2 97%. Body mass index is 55.97 kg/m.  Lab Results  Component Value Date   CREATININE 1.03 (H) 05/07/2023   BUN 17 05/07/2023   NA 142 05/07/2023   K 4.2 05/07/2023   CL 105 05/07/2023   CO2 29 05/07/2023   Lab Results  Component Value Date   ALT 18 05/07/2023   AST 16 05/07/2023   ALKPHOS 121 05/07/2023   BILITOT 1.0 05/07/2023   Lab Results  Component Value Date   HGBA1C 8.3 (H) 01/22/2023   HGBA1C 6.4 (H) 09/06/2021   HGBA1C 9.4 03/29/2021   HGBA1C 7.5 (H) 06/26/2019   HGBA1C 7.6 (H) 07/12/2017   No results found for: "INSULIN" Lab Results  Component Value Date   TSH 3.348 11/16/2011   Lab Results  Component Value Date   CHOL 184 03/29/2021   HDL 52 03/29/2021   LDLCALC 109 03/29/2021   TRIG 127 03/29/2021   Lab Results  Component Value Date   VD25OH 35.6 01/22/2023   VD25OH 38.5 10/30/2022   VD25OH 49.4 02/15/2022   Lab Results  Component Value Date   WBC 9.7 05/07/2023  HGB 10.3 (L) 05/07/2023   HCT 34.1 (L) 05/07/2023   MCV 86.5 05/07/2023   PLT 301 05/07/2023   Lab Results  Component Value Date   IRON 68 11/16/2011   TIBC 302 11/16/2011   FERRITIN 253 11/16/2011   Attestation Statements:   Reviewed by clinician on day of visit: allergies, medications, problem list, medical history, surgical history, family history, social history, and previous encounter notes.   I, Burt Knack, am acting as transcriptionist for Quillian Quince, MD.  I have reviewed the above documentation for accuracy and completeness, and I agree with the above. -  Quillian Quince, MD

## 2023-07-15 ENCOUNTER — Other Ambulatory Visit: Payer: Self-pay

## 2023-07-15 DIAGNOSIS — Z79899 Other long term (current) drug therapy: Secondary | ICD-10-CM

## 2023-07-15 DIAGNOSIS — E118 Type 2 diabetes mellitus with unspecified complications: Secondary | ICD-10-CM

## 2023-07-15 DIAGNOSIS — R600 Localized edema: Secondary | ICD-10-CM

## 2023-07-15 DIAGNOSIS — I5023 Acute on chronic systolic (congestive) heart failure: Secondary | ICD-10-CM

## 2023-07-15 DIAGNOSIS — I5020 Unspecified systolic (congestive) heart failure: Secondary | ICD-10-CM

## 2023-07-15 MED ORDER — FUROSEMIDE 40 MG PO TABS: 40.0000 mg | ORAL_TABLET | Freq: Every day | ORAL | 0 refills | Status: DC

## 2023-07-18 ENCOUNTER — Encounter: Payer: Self-pay | Admitting: Cardiovascular Disease

## 2023-07-18 NOTE — Progress Notes (Signed)
Cardiology Office Note:  .   Date:  07/22/2023  ID:  Virgel Manifold, DOB 05-19-1956, MRN [id number] PCP: Darrow Bussing, MD  [facility] Providers Cardiologist:  Meriam Sprague, MD (Inactive), former Shari Prows, Now Shandreka Dante   Sleep Medicine:  Armanda Magic, MD    History of Present Illness: . Oct. 21, 2024    Whitney [name] is a 67 y.o. female with hx of HFrEF with subsequent improvement of her EF.  Mild AS, no significant CAD , HTN, morbid obesity  Has a hx of PEA arrest in 2013 with reduced LVEF  EF improved  Has had significant dietary indiscretion in the past  She walks with a walker Was very short of breath walking back from the front of the office to the exam room today  Refused ECG   Echocardiogram from March, 2023 reveals LVEF of 50 to 55%.  She has mild LVH.  She has grade 1 diastolic dysfunction. Mild aortic stenosis.  Mild AI  Trivial TR   Wt is 318 lbs  Has chronic leg edema  Has not had her lasix in 2 days  ( didn't take if after church yesterday and did not take today since she has appointments.   Sees Dr. Mayford Knife for OSA  Its difficult for her to lie flat ( gets short of breath )  Does not elevated her legs very often  Does not limit her salt ( she eats sea salt thinking it was healthy )  Eats processed foods ( deli meat / ham )  Discussed using Ms. Dash       ROS:   Studies Reviewed: .         Risk Assessment/Calculations:             Physical Exam:   VS:  BP 134/70   Pulse 67   Ht 5\' 2"  (1.575 m)   Wt (!) 318 lb (144.2 kg)   SpO2 97%   BMI 58.16 kg/m    Wt Readings from Last 3 Encounters:  07/22/23 (!) 318 lb (144.2 kg)  07/02/23 (!) 306 lb (138.8 kg)  03/21/23 (!) 302 lb (137 kg)    GEN: morbidly obese , middle age female ,  in no acute distress NECK: No JVD; No carotid bruits CARDIAC: RRR, soft systolic murmur  RESPIRATORY:  Clear to auscultation without rales, wheezing or rhonchi  ABDOMEN: morbildly obese   EXTREMITIES: 2+ bilateral pitting edema.  ASSESSMENT AND PLAN: .     HFpEF :   has grade I DD.   BP is well controlled.  She still eats a fairly high salt diet.  She was eating sea salt thinking that it was a healthy salt.  I have encouraged her to stay away from sea salt and avoid processed meat.  Advised her to work on calorie reduction in an effort to lose weight.  She is a bit more volume overloaded today because she skipped her Lasix yesterday and today.  She forgot to take her Lasix yesterday afternoon after she got back from church and has not taken it again today because of her appointment.  I encouraged her to go and start taking Lasix 40 mg a day.  Have her monitor to remember to take it in the afternoon even if she has church or appointments in the morning.  2.  Morbid obesity: Patient has gained weight over the past year.  We discussed calorie reduction.  3.  Leg edema: We discussed leg elevation  and reduction of salt.  She will follow-up with Dr. Mayford Knife in 1 year.         Dispo: 1 year with Dr. Mayford Knife    Signed, Kristeen Miss, MD

## 2023-07-22 ENCOUNTER — Encounter: Payer: Self-pay | Admitting: Cardiovascular Disease

## 2023-07-22 ENCOUNTER — Ambulatory Visit: Payer: PPO | Attending: Cardiovascular Disease | Admitting: Cardiovascular Disease

## 2023-07-22 VITALS — BP 134/70 | HR 67 | Ht 62.0 in | Wt 318.0 lb

## 2023-07-22 DIAGNOSIS — I5032 Chronic diastolic (congestive) heart failure: Secondary | ICD-10-CM

## 2023-07-22 DIAGNOSIS — I5030 Unspecified diastolic (congestive) heart failure: Secondary | ICD-10-CM | POA: Insufficient documentation

## 2023-07-22 NOTE — Patient Instructions (Signed)
Medication Instructions:  Your physician recommends that you continue on your current medications as directed. Please refer to the Current Medication list given to you today.  *If you need a refill on your cardiac medications before your next appointment, please call your pharmacy*  Lab Work: If you have labs (blood work) drawn today and your tests are completely normal, you will receive your results only by: MyChart Message (if you have MyChart) OR [name] in the mail If you have any lab test that is abnormal or we need to change your treatment, we will call you to review the results.  Follow-Up: At [facility], you and your health needs are our priority.  As part of our continuing mission to provide you with exceptional heart care, we have created designated Provider Care Teams.  These Care Teams include your primary Cardiologist (physician) and Advanced Practice Providers (APPs -  Physician Assistants and Nurse Practitioners) who all work together to provide you with the care you need, when you need it.  We recommend signing up for the patient portal called "MyChart".  Sign up information is provided on this After Visit Summary.  MyChart is used to connect with patients for Virtual Visits (Telemedicine).  Patients are able to view lab/test results, encounter notes, upcoming appointments, etc.  Non-urgent messages can be sent to your provider as well.   To learn more about what you can do with MyChart, go to [email or web address].    Your next appointment:   1 year(s)  Provider:   Dr. Mayford Knife    Other Instructions  Diet & Lifestyle recommendations:  Physical activity recommendation (The Physical Activity Guidelines for Americans. JAMA 2018;Nov 12) At least 150-300 minutes a week of moderate-intensity, or 75-150 minutes a week of vigorous-intensity aerobic physical activity, or an equivalent combination of moderate- and vigorous-intensity aerobic activity. Adults should  perform muscle-strengthening activities on 2 or more days a week. Older adults should do multicomponent physical activity that includes balance training as well as aerobic and muscle-strengthening activities. Benefits of increased physical activity include lower risk of mortality including cardiovascular mortality, lower risk of cardiovascular events and associated risk factors (hypertension and diabetes), and lower risk of many cancers (including bladder, breast, colon, endometrium, esophagus, kidney, lung, and stomach). Additional improvments have been seen in cognition, risk of dementia, anxiety and depression, improved bone health, lower risk of falls, and associated injuries.  Dietary recommendation The 2019 ACC/AHA guidelines promote nutrition as a main fixture of cardiovascular wellness, with a recommendation for a varied diet of fruit, vegetables, fish, legumes, and whole grains (Class I), as well as recommendations to reduce sodium, cholesterol, processed meats, and refined sugars (Class IIa recommendation).10 Sodium intake, a topic of some controversy as of late, is recommended to be kept at 1,500 mg/day or less, far below the average daily intake in the [place] of 3,409 mg/day, and notably below that of previous [place] recommendations for 300mg /day.10,11 For those unable to reach 1,500 mg/day, they recommend at least a reduction of 1000 mg/day.  A Pes[place]-Mediterranean Diet With Intermittent Fasting: JACC Review Topic of the Week. J Am Coll Cardiol 2020;76:1484-1493 Pes[place]-Mediterranean diet, it is supplemented with extra-virgin olive oil (EVOO), which is the principle fat source, along with moderate amounts of dairy (particularly yogurt and cheese) and eggs, as well as modest amounts of alcohol consumption (ideally red wine with the evening meal), but few red and processed meats.

## 2023-07-26 ENCOUNTER — Other Ambulatory Visit (INDEPENDENT_AMBULATORY_CARE_PROVIDER_SITE_OTHER): Payer: Self-pay | Admitting: Family Medicine

## 2023-07-26 DIAGNOSIS — E559 Vitamin D deficiency, unspecified: Secondary | ICD-10-CM

## 2023-07-30 ENCOUNTER — Ambulatory Visit (INDEPENDENT_AMBULATORY_CARE_PROVIDER_SITE_OTHER): Payer: PPO | Admitting: Family Medicine

## 2023-08-05 ENCOUNTER — Other Ambulatory Visit: Payer: Self-pay | Admitting: *Deleted

## 2023-08-05 DIAGNOSIS — I5022 Chronic systolic (congestive) heart failure: Secondary | ICD-10-CM

## 2023-08-05 MED ORDER — SPIRONOLACTONE 25 MG PO TABS: 25.0000 mg | ORAL_TABLET | Freq: Every day | ORAL | 3 refills | Status: AC

## 2023-08-19 ENCOUNTER — Other Ambulatory Visit: Payer: Self-pay | Admitting: Cardiovascular Disease

## 2023-08-19 DIAGNOSIS — I10 Essential (primary) hypertension: Secondary | ICD-10-CM

## 2023-08-19 MED ORDER — AMLODIPINE BESYLATE 5 MG PO TABS: 5.0000 mg | ORAL_TABLET | Freq: Every day | ORAL | 3 refills | Status: DC

## 2023-08-22 ENCOUNTER — Ambulatory Visit (INDEPENDENT_AMBULATORY_CARE_PROVIDER_SITE_OTHER): Payer: PPO | Admitting: Family Medicine

## 2023-08-22 ENCOUNTER — Encounter (INDEPENDENT_AMBULATORY_CARE_PROVIDER_SITE_OTHER): Payer: Self-pay | Admitting: Family Medicine

## 2023-08-22 VITALS — BP 149/64 | HR 89 | Temp 98.2°F | Ht 62.0 in | Wt 316.0 lb

## 2023-08-22 DIAGNOSIS — I1A0 Resistant hypertension: Secondary | ICD-10-CM | POA: Diagnosis not present

## 2023-08-22 DIAGNOSIS — E1165 Type 2 diabetes mellitus with hyperglycemia: Secondary | ICD-10-CM

## 2023-08-22 DIAGNOSIS — I110 Hypertensive heart disease with heart failure: Secondary | ICD-10-CM

## 2023-08-22 DIAGNOSIS — E669 Obesity, unspecified: Secondary | ICD-10-CM

## 2023-08-22 DIAGNOSIS — E559 Vitamin D deficiency, unspecified: Secondary | ICD-10-CM

## 2023-08-22 DIAGNOSIS — L299 Pruritus, unspecified: Secondary | ICD-10-CM

## 2023-08-22 DIAGNOSIS — Z7984 Long term (current) use of oral hypoglycemic drugs: Secondary | ICD-10-CM

## 2023-08-22 DIAGNOSIS — Z6841 Body Mass Index (BMI) 40.0 and over, adult: Secondary | ICD-10-CM

## 2023-08-22 DIAGNOSIS — I5030 Unspecified diastolic (congestive) heart failure: Secondary | ICD-10-CM

## 2023-08-22 DIAGNOSIS — I5032 Chronic diastolic (congestive) heart failure: Secondary | ICD-10-CM | POA: Diagnosis not present

## 2023-08-22 DIAGNOSIS — E119 Type 2 diabetes mellitus without complications: Secondary | ICD-10-CM

## 2023-08-22 MED ORDER — VITAMIN D (ERGOCALCIFEROL) 1.25 MG (50000 UNIT) PO CAPS: 50000.0000 [IU] | ORAL_CAPSULE | ORAL | 0 refills | Status: AC

## 2023-08-22 NOTE — Progress Notes (Signed)
.smr  Office: [phone]  /  Fax: [phone]  WEIGHT SUMMARY AND BIOMETRICS  Anthropometric Measurements Height: 5\' 2"  (1.575 m) Weight: (!) 316 lb (143.3 kg) BMI (Calculated): 57.78 Weight at Last Visit: 306 lb Weight Lost Since Last Visit: 0 Weight Gained Since Last Visit: 10 lb Starting Weight: 313 lb Total Weight Loss (lbs): 0 lb (0 kg)   Body Composition  Body Fat %: 68.6 % Fat Mass (lbs): 217 lbs Muscle Mass (lbs): 94.4 lbs Visceral Fat Rating : 31   Other Clinical Data Fasting: no Labs: no Today's Visit #: 24 Starting Date: 04/06/21    Chief Complaint: OBESITY    History of Present Illness   The patient, with a history of heart failure with preserved ejection fraction and grade one diastolic dysfunction, type two diabetes, and hypertension, presents for a routine follow-up. She reports a weight gain of ten pounds over the past seven weeks and admits to occasionally skipping her Lasix. Her diabetes control has been suboptimal, with a recent hemoglobin A1c of 8.3 in April. She also reports elevated blood pressure readings today, despite being on multiple antihypertensive medications, including spironolactone, metoprolol, lisinopril, Lasix, and amlodipine. The patient admits to not following a specific diet plan and not exercising regularly. She has a history of non-compliance but is still trying to make some improvements.  The patient expresses a desire to lose weight and improve her health but acknowledges the challenges she faces, including social pressures and the difficulty of maintaining dietary restrictions during social events. She expresses interest in weight loss assistance programs and is open to trying new medications to help with weight loss and blood sugar control. However, she expresses concerns about potential side effects and costs associated with certain medications, such as Ozempic and Wegovy, which she learned about from commercials and news  reports.  The patient reports a positive experience with Novolin N, which she says has helped control her blood sugar without causing any adverse effects. She also mentions previous adverse reactions to other diabetes medications, including yeast infections from Jardiance and gastrointestinal upset from Metformin.  The patient also reports a history of varicose veins and describes a strange, intermittent pain in her left leg. She describes the pain as sharp and sudden, but it usually subsides after a while. She also reports severe itching in her legs, which sometimes leads to scratching and subsequent bleeding. She expresses concern about the darkening and thickening of the skin on her legs and is eager to find a solution to alleviate the itching and prevent further skin damage.  The patient also mentions a history of heart attack and cardiac arrest in 2013. She expresses concern about fluid retention, acknowledging that she has had issues with fluid retention in the past. She is currently taking furosemide intermittently for fluid management. She also mentions using a sleep apnea machine and usually sleeping in an upright position due to difficulty breathing when lying flat. She expresses concern about her weight gain and its potential impact on her heart health.          PHYSICAL EXAM:  Blood pressure (!) 149/64, pulse 89, temperature 98.2 F (36.8 C), height 5\' 2"  (1.575 m), weight (!) 316 lb (143.3 kg), SpO2 96%. Body mass index is 57.8 kg/m.  DIAGNOSTIC DATA REVIEWED:  BMET    Component Value Date/Time   NA 142 05/07/2023 1822   NA 142 01/22/2023 0959   K 4.2 05/07/2023 1822   CL 105 05/07/2023 1822   CO2 29  05/07/2023 1822   GLUCOSE 147 (H) 05/07/2023 1822   BUN 17 05/07/2023 1822   BUN 28 (H) 01/22/2023 0959   CREATININE 1.03 (H) 05/07/2023 1822   CALCIUM 9.6 05/07/2023 1822   GFRNONAA 59 (L) 05/07/2023 1822   GFRAA 93 06/30/2020 1445   Lab Results  Component Value Date    HGBA1C 8.3 (H) 01/22/2023   HGBA1C 7.6 (H) 11/16/2011   No results found for: "INSULIN" Lab Results  Component Value Date   TSH 3.348 11/16/2011   CBC    Component Value Date/Time   WBC 9.7 05/07/2023 1822   RBC 3.94 05/07/2023 1822   HGB 10.3 (L) 05/07/2023 1822   HGB 10.4 (L) 10/30/2022 1615   HCT 34.1 (L) 05/07/2023 1822   HCT 31.1 (L) 10/30/2022 1615   PLT 301 05/07/2023 1822   PLT 254 10/30/2022 1615   MCV 86.5 05/07/2023 1822   MCV 81 10/30/2022 1615   MCH 26.1 05/07/2023 1822   MCHC 30.2 05/07/2023 1822   RDW 14.6 05/07/2023 1822   RDW 13.1 10/30/2022 1615   Iron Studies    Component Value Date/Time   IRON 68 11/16/2011 0630   TIBC 302 11/16/2011 0630   FERRITIN 253 11/16/2011 0630   IRONPCTSAT 23 11/16/2011 0630   Lipid Panel     Component Value Date/Time   CHOL 184 03/29/2021 0000   TRIG 127 03/29/2021 0000   HDL 52 03/29/2021 0000   LDLCALC 109 03/29/2021 0000   Hepatic Function Panel     Component Value Date/Time   PROT 7.3 05/07/2023 1822   PROT 7.3 01/22/2023 0959   ALBUMIN 3.4 (L) 05/07/2023 1822   ALBUMIN 4.2 01/22/2023 0959   AST 16 05/07/2023 1822   ALT 18 05/07/2023 1822   ALKPHOS 121 05/07/2023 1822   BILITOT 1.0 05/07/2023 1822   BILITOT 0.6 01/22/2023 0959   BILIDIR 0.2 11/16/2011 0630   IBILI 0.6 11/16/2011 0630      Component Value Date/Time   TSH 3.348 11/16/2011 0630   Nutritional Lab Results  Component Value Date   VD25OH 35.6 01/22/2023   VD25OH 38.5 10/30/2022   VD25OH 49.4 02/15/2022     Assessment and Plan    Heart Failure with Preserved Ejection Fraction (HFpEF) Grade one diastolic dysfunction with recent 10-pound weight gain over seven weeks, likely due to fluid retention. Non-adherence to Lasix noted. Current medications: spironolactone, metoprolol, lisinopril, Lasix, and amlodipine. No acute dyspnea or orthopnea. Discussed importance of Lasix adherence to manage fluid retention and prevent heart failure  exacerbation. Patient plans to take Lasix after returning home from events to avoid frequent urination in public. - Encourage Lasix adherence - Monitor weight and fluid status -importance of reducing sodium in her diet emphasized - Order CMP and BNP -Pt to go to ED if shortness of breath worsens or chest pain develops  Hypertension Elevated blood pressure readings (146/67 mmHg, 149/64 mmHg). Current medications: spironolactone, metoprolol, lisinopril, Lasix, and amlodipine. Recent lapse in amlodipine intake due to prescription refill issues. Discussed importance of medication adherence for effective blood pressure management. - Ensure continuation of amlodipine - Monitor blood pressure regularly - Reinforce adherence to antihypertensive regimen  Type 2 Diabetes Mellitus Poorly controlled diabetes with recent hemoglobin A1c of 8.3% in April. Current medication: Novolin N. Intolerance to metformin and Jardiance due to gastrointestinal side effects and recurrent yeast infections. Discussed Rybelsus as an alternative GLP-1 receptor agonist in pill form, which may aid blood sugar control and weight loss. Explained importance of  taking Rybelsus on an empty stomach and eating 30 minutes later to avoid nausea and ensure proper absorption. Patient expressed concerns about side effects and insurance coverage but is willing to try Rybelsus. - Initiate Rybelsus 3mg  with instructions to take on an empty stomach and eat 30 minutes later - Monitor blood glucose levels - Order labs to assess current glycemic control  Pruritus and venus stasis changes Severe leg itching leading to scratching, burning, and occasional bleeding. Current use of Cetaphil provides some relief. Possible association with diabetes. Discussed using Eucerin cream or Crisco (cheaper option) for skin hydration to alleviate symptoms. Explained importance of applying cream twice daily, especially after washing, to maintain skin moisture and  reduce itching. - Recommend Eucerin cream or Crisco for skin hydration - Apply twice daily, especially after washing - Monitor for improvement and reassess if symptoms persist  General Health Maintenance Discussed importance of diet and exercise in managing weight and overall health. Patient interested in weight loss programs but concerned about medications like Ozempic and Wegovy due to potential side effects and insurance coverage issues. Explained Rybelsus as a non-injection alternative that may be better tolerated. - Encourage adherence to a healthy diet and regular exercise - Provide support and resources for weight management   Follow-up - Schedule follow-up appointment in 3-4 weeks - Ensure privacy  of medical records as requested         I have personally spent 4 minutes total time today in preparation, patient care, and documentation for this visit, including the following: review of clinical lab tests; review of medical tests/procedures/services.    She was informed of the importance of frequent follow up visits to maximize her success with intensive lifestyle modifications for her multiple health conditions.    Quillian Quince, MD

## 2023-08-23 LAB — CMP14+EGFR
ALT: 10 [IU]/L (ref 0–32)
AST: 11 [IU]/L (ref 0–40)
Albumin: 3.8 g/dL — ABNORMAL LOW (ref 3.9–4.9)
Alkaline Phosphatase: 160 [IU]/L — ABNORMAL HIGH (ref 44–121)
BUN/Creatinine Ratio: 14 (ref 12–28)
BUN: 14 mg/dL (ref 8–27)
Bilirubin Total: 0.7 mg/dL (ref 0.0–1.2)
CO2: 23 mmol/L (ref 20–29)
Calcium: 9.4 mg/dL (ref 8.7–10.3)
Chloride: 105 mmol/L (ref 96–106)
Creatinine, Ser: 1.01 mg/dL — ABNORMAL HIGH (ref 0.57–1.00)
Globulin, Total: 2.8 g/dL (ref 1.5–4.5)
Glucose: 210 mg/dL — ABNORMAL HIGH (ref 70–99)
Potassium: 4.4 mmol/L (ref 3.5–5.2)
Sodium: 142 mmol/L (ref 134–144)
Total Protein: 6.6 g/dL (ref 6.0–8.5)
eGFR: 60 mL/min/{1.73_m2} (ref >59)

## 2023-08-23 LAB — HEMOGLOBIN A1C
Est. average glucose Bld gHb Est-mCnc: 200 mg/dL
Hgb A1c MFr Bld: 8.6 % — ABNORMAL HIGH (ref 4.8–5.6)

## 2023-08-23 LAB — VITAMIN D 25 HYDROXY (VIT D DEFICIENCY, FRACTURES): Vit D, 25-Hydroxy: 34.8 ng/mL (ref 30.0–100.0)

## 2023-08-23 LAB — BRAIN NATRIURETIC PEPTIDE: BNP: 42.8 pg/mL (ref 0.0–100.0)

## 2023-09-12 ENCOUNTER — Encounter: Payer: Self-pay | Admitting: Podiatry

## 2023-09-12 ENCOUNTER — Other Ambulatory Visit: Payer: Self-pay

## 2023-09-12 ENCOUNTER — Ambulatory Visit (INDEPENDENT_AMBULATORY_CARE_PROVIDER_SITE_OTHER): Payer: PPO | Admitting: Podiatry

## 2023-09-12 DIAGNOSIS — E1165 Type 2 diabetes mellitus with hyperglycemia: Secondary | ICD-10-CM | POA: Diagnosis not present

## 2023-09-12 DIAGNOSIS — M216X1 Other acquired deformities of right foot: Secondary | ICD-10-CM | POA: Diagnosis not present

## 2023-09-12 DIAGNOSIS — Q828 Other specified congenital malformations of skin: Secondary | ICD-10-CM | POA: Diagnosis not present

## 2023-09-12 DIAGNOSIS — B351 Tinea unguium: Secondary | ICD-10-CM

## 2023-09-12 DIAGNOSIS — M79675 Pain in left toe(s): Secondary | ICD-10-CM | POA: Diagnosis not present

## 2023-09-12 DIAGNOSIS — M79674 Pain in right toe(s): Secondary | ICD-10-CM

## 2023-09-12 DIAGNOSIS — M216X2 Other acquired deformities of left foot: Secondary | ICD-10-CM

## 2023-09-12 MED ORDER — ATORVASTATIN CALCIUM 40 MG PO TABS: 40.0000 mg | ORAL_TABLET | Freq: Every day | ORAL | 3 refills | Status: DC

## 2023-09-12 NOTE — Progress Notes (Signed)
This patient returns to my office for at risk foot care.  This patient requires this care by a professional since this patient will be at risk due to having diabetes.  Patient has painful corn fifth toe right foot and painful callus on the outside of both feet  B/L.  This patient is unable to cut nails herself since the patient cannot reach her nails.These nails are painful walking and wearing shoes.  This patient presents for at risk foot care today.  General Appearance  Alert, conversant and in no acute stress.  Vascular  Defeered due to wearing compression socks.  Patient has not been seen for 9 months.  Neurologic  Senn-Weinstein monofilament wire test diminished  bilaterally. Muscle power within normal limits bilaterally.  Nails Thick disfigured discolored nails with subungual debris  from hallux to fifth toes bilaterally. No evidence of bacterial infection or drainage bilaterally.  Orthopedic  No limitations of motion  feet .  No crepitus or effusions noted.  No bony pathology or digital deformities noted.  Adducto varus fifth digit  Right foot.  Plantar flexed fifth metatarsal  B/L.  Skin  normotropic skin with no porokeratosis noted bilaterally.  No signs of infections or ulcers noted. Porokeratosis sub 5th  B/L.  Corn 5th digit right foot.    Onychomycosis  Pain in right toes  Pain in left toes  Porokeratosis B/L  Corn fifth toe right foot.  Consent was obtained for treatment procedures.   Mechanical debridement of nails 1-5  bilaterally performed with a nail nipper.  Filed with dremel without incident. Debridement of callus  Both feet with # 15 blade and dremel tool.    Return office visit   3 months                   Told patient to return for periodic foot care and evaluation due to potential at risk complications.   Gardiner Barefoot DPM

## 2023-09-20 ENCOUNTER — Other Ambulatory Visit: Payer: Self-pay | Admitting: Physician Assistant

## 2023-09-20 DIAGNOSIS — R600 Localized edema: Secondary | ICD-10-CM

## 2023-09-20 DIAGNOSIS — Z794 Long term (current) use of insulin: Secondary | ICD-10-CM

## 2023-09-20 DIAGNOSIS — I5020 Unspecified systolic (congestive) heart failure: Secondary | ICD-10-CM

## 2023-09-20 DIAGNOSIS — I5023 Acute on chronic systolic (congestive) heart failure: Secondary | ICD-10-CM

## 2023-09-20 DIAGNOSIS — Z79899 Other long term (current) drug therapy: Secondary | ICD-10-CM

## 2023-09-26 ENCOUNTER — Ambulatory Visit (HOSPITAL_COMMUNITY)
Admission: EM | Admit: 2023-09-26 | Discharge: 2023-09-26 | Disposition: A | Discharge status: Home / Self Care | Payer: PPO | Attending: Family Medicine | Admitting: Family Medicine

## 2023-09-26 ENCOUNTER — Encounter (HOSPITAL_COMMUNITY): Payer: Self-pay

## 2023-09-26 DIAGNOSIS — I83003 Varicose veins of unspecified lower extremity with ulcer of ankle: Secondary | ICD-10-CM | POA: Diagnosis not present

## 2023-09-26 DIAGNOSIS — L03119 Cellulitis of unspecified part of limb: Secondary | ICD-10-CM

## 2023-09-26 DIAGNOSIS — L97301 Non-pressure chronic ulcer of unspecified ankle limited to breakdown of skin: Secondary | ICD-10-CM

## 2023-09-26 MED ORDER — AMOXICILLIN-POT CLAVULANATE 875-125 MG PO TABS: 1.0000 | ORAL_TABLET | Freq: Two times a day (BID) | ORAL | 0 refills | Status: AC

## 2023-09-26 NOTE — ED Provider Notes (Signed)
[facility]    CSN: [id number] Arrival date & time: 09/26/23  1722      History   Chief Complaint Chief Complaint  Patient presents with   Leg Pain    HPI Whitney [name] is a 67 y.o. female.    Leg Pain Here for bilateral low lower leg pain.  It is stinging and sometimes feels like a bee sting to her.  This began bothering her in the last 2 days.  No fever or chills She has had chronic trouble with some edema in her lower legs and is had trouble with open wounds previously on her legs but that had healed up. She relates she was up on her feet more cooking for Christmas in the last few days and wonders if that contributed. Her sugars have been running in the upper 100s       Past Medical History:  Diagnosis Date   Arthritis    left knee,right hip   Back pain    Cardiac arrest (HCC)    CHF (congestive heart failure) (HCC)    Constipation    Depression    Diabetes mellitus    GERD (gastroesophageal reflux disease)    Glaucoma    Glaucoma    H/O cardiac arrest 11/2011   PEA   Heart murmur    Hyperlipidemia    Hypertension    Joint pain    Myocardial infarction [facility]) 2013   Palpitations    Sleep apnea    Bring machine,mask and tubing   Swelling     Patient Active Problem List   Diagnosis Date Noted   (HFpEF) heart failure with preserved ejection fraction (HCC) 07/22/2023   Iron deficiency anemia secondary to inadequate dietary iron intake 10/30/2022   BMI 50.0-59.9, adult (HCC) 10/30/2022   Obesity, Beginning BMI 59.14 10/30/2022   Dehydration 09/12/2022   Allergic rhinitis due to pollen 03/02/2022   Atherosclerotic heart disease of native coronary artery without angina pectoris 03/02/2022   Chronic venous stasis dermatitis 03/02/2022   Decreased estrogen level 03/02/2022   Long term (current) use of insulin (HCC) 03/02/2022   Neuropathy 03/02/2022   Peripheral venous insufficiency 03/02/2022   Varicose veins of lower extremity with  inflammation 03/02/2022   Plantar flexed metatarsal bone of left foot 06/26/2021   Plantar flexed metatarsal bone of right foot 06/26/2021   Diabetes mellitus (HCC) 06/20/2021   Vitamin D deficiency 05/15/2021   Detrusor muscle hypertonia 11/04/2019   Disorder of mitral valve 11/04/2019   Hypertension associated with type 2 diabetes mellitus (HCC) 11/04/2019   Gastro-esophageal reflux disease without esophagitis 11/04/2019   Hay fever 11/04/2019   Pure hypercholesterolemia 11/04/2019   Type 2 diabetes mellitus with hyperglycemia (HCC) 11/04/2019   Hypokalemia 06/25/2019   History of cardiomyopathy 06/25/2019   Pain due to onychomycosis of toenails of both feet 03/18/2019   Porokeratosis 03/18/2019   Acute right lumbar radiculopathy 07/25/2017   At risk for adverse drug event 07/16/2017   Humerus fracture 07/12/2017   Morbid obesity due to excess calories (HCC) 12/14/2015   Chronic systolic heart failure (HCC) 12/14/2015   Chest pain 12/14/2015   Angina decubitus (HCC) 12/14/2015   Class 3 severe obesity with serious comorbidity and body mass index (BMI) of 50.0 to 59.9 in adult (HCC) 01/06/2013   Arthritis of knee, degenerative 01/06/2013   Osteoarthritis of both knees 01/06/2013   OSA (obstructive sleep apnea) 11/30/2011   Cardiac arrest (HCC) 11/13/2011   Diabetes mellitus type 2, uncontrolled  11/13/2011    Past Surgical History:  Procedure Laterality Date   BACK SURGERY     COLONOSCOPY N/A 09/12/2016   Procedure: COLONOSCOPY;  Surgeon: Vida Rigger, MD;  Location: [facility];  Service: Endoscopy;  Laterality: N/A;   CYSTOSCOPY W/ URETERAL STENT PLACEMENT Left 01/04/2013   Procedure: CYSTOSCOPY WITH RETROGRADE PYELOGRAM/URETERAL STENT PLACEMENT;  Surgeon: Valetta Fuller, MD;  Location: [facility];  Service: Urology;  Laterality: Left;   CYSTOSCOPY WITH RETROGRADE PYELOGRAM, URETEROSCOPY AND STENT PLACEMENT Left 01/26/2013   Procedure: CYSTOSCOPY, JJ STENT REMOVAL, LEFT URETEROSCOPY,  ;  Surgeon: Valetta Fuller, MD;  Location: [facility];  Service: Urology;  Laterality: Left;  CYSTO, JJ STENT REMOVAL, LEFT URETEROSCOPY    FOOT SURGERY     HERNIA REPAIR     HOLMIUM LASER APPLICATION Left 01/26/2013   Procedure: HOLMIUM LASER LITHOTRIPSY ;  Surgeon: Valetta Fuller, MD;  Location: [facility];  Service: Urology;  Laterality: Left;   KNEE SURGERY     left   PARTIAL HYSTERECTOMY      OB History     Gravida  0   Para  0   Term  0   Preterm  0   AB  0   Living  0      SAB  0   IAB  0   Ectopic  0   Multiple  0   Live Births  0            Home Medications    Prior to Admission medications   Medication Sig Start Date End Date Taking? Authorizing Provider  amLODipine (NORVASC) 5 MG tablet Take 1 tablet (5 mg total) by mouth daily. 08/19/23  Yes Nahser, Deloris Ping, MD  amoxicillin-clavulanate (AUGMENTIN) 875-125 MG tablet Take 1 tablet by mouth 2 (two) times daily for 7 days. 09/26/23 10/03/23 Yes Zenia Resides, MD  aspirin EC 81 MG tablet Take 1 tablet (81 mg total) by mouth daily. Swallow whole. 06/23/20  Yes Meriam Sprague, MD  atorvastatin (LIPITOR) 40 MG tablet Take 1 tablet (40 mg total) by mouth daily. 09/12/23  Yes Quintella Reichert, MD  BD INSULIN SYRINGE U/F 31G X 5/16" 0.5 ML MISC as directed.  03/02/19  Yes [provider]  blood glucose meter kit and supplies KIT Dispense based on patient and insurance preference. Use up to four times daily as directed. (FOR ICD-9 250.00, 250.01). 09/02/17  Yes Medina-Vargas, Monina C, NP  furosemide (LASIX) 40 MG tablet TAKE 1 TABLET BY MOUTH EVERY DAY 09/20/23  Yes Nahser, Deloris Ping, MD  gabapentin (NEURONTIN) 300 MG capsule Take 300 mg by mouth See admin instructions. Take 300 mg by mouth two times a day and an additional 300 mg once daily as needed for nerve pain 03/11/19  Yes [provider]  Lancets (ONETOUCH DELICA PLUS LANCET33G) MISC USE TO TEST YOUR BLOOD SUGAR 2 TIMES A DAY 11/23/19  Yes  [provider]  lidocaine (LIDODERM) 5 % Place 1 patch onto the skin daily. Remove & Discard patch within 12 hours or as directed by MD 12/07/22  Yes Raspet, Erin K, PA-C  lisinopril (ZESTRIL) 40 MG tablet Take 1 tablet (40 mg total) by mouth daily. 03/06/23  Yes Meriam Sprague, MD  loratadine (CLARITIN) 10 MG tablet Take 10 mg by mouth daily. 12/07/19  Yes [provider]  metoprolol succinate (TOPROL-XL) 100 MG 24 hr tablet Take 1 tablet (100 mg total) by mouth daily before breakfast. Take  with or immediately following a meal. 08/29/17  Yes Medina-Vargas, Monina C, NP  mometasone (ELOCON) 0.1 % ointment PLEASE SEE ATTACHED FOR DETAILED DIRECTIONS 03/20/23  Yes [provider]  NOVOLIN N 100 UNIT/ML injection Inject 35 Units into the skin 2 (two) times daily after a meal. 35 units in the morning and 15 units at night time. 08/08/18  Yes [provider]  ONETOUCH ULTRA test strip 2 (two) times daily. 01/14/20  Yes [provider]  oxybutynin (DITROPAN) 5 MG tablet Take 1 tablet (5 mg total) by mouth daily. 08/29/17  Yes Medina-Vargas, Monina C, NP  pantoprazole (PROTONIX) 40 MG tablet Take 1 tablet (40 mg total) by mouth daily. 08/29/17  Yes Medina-Vargas, Monina C, NP  sodium chloride (OCEAN) 0.65 % SOLN nasal spray Place 2 sprays into both nostrils in the morning and at bedtime. 10/31/22  Yes [name], Cornelious Bryant, MD  spironolactone (ALDACTONE) 25 MG tablet Take 1 tablet (25 mg total) by mouth daily. 08/05/23  Yes Nahser, Deloris Ping, MD  traMADol (ULTRAM) 50 MG tablet Take 2 tablets (100 mg total) by mouth every 8 (eight) hours as needed for severe pain. 08/29/17  Yes Medina-Vargas, Monina C, NP  Vitamin D, Ergocalciferol, (DRISDOL) 1.25 MG (50000 UNIT) CAPS capsule Take 1 capsule (50,000 Units total) by mouth every 7 (seven) days. 08/22/23  Yes Wilder Glade, MD    Family History Family History  Problem Relation Age of Onset   Obesity Mother    Kidney  disease Mother    Heart disease Mother    Hyperlipidemia Mother    Hypertension Mother    Diabetes Mother    Asthma Mother    Anxiety disorder Father    Depression Father    Heart disease Father    Hyperlipidemia Father    Hypertension Father    Diabetes Father     Social History Social History   Tobacco Use   Smoking status: Never   Smokeless tobacco: Never  Vaping Use   Vaping status: Never Used  Substance Use Topics   Alcohol use: No   Drug use: No     Allergies   Contrast media [iodinated contrast media], Empagliflozin, and Metformin   Review of Systems Review of Systems   Physical Exam Triage Vital Signs ED Triage Vitals  Encounter Vitals Group     BP 09/26/23 1828 (!) 151/90     Systolic BP Percentile --      Diastolic BP Percentile --      Pulse Rate 09/26/23 1828 73     Resp 09/26/23 1828 18     Temp 09/26/23 1828 97.9 F (36.6 C)     Temp Source 09/26/23 1828 Oral     SpO2 09/26/23 1828 95 %     Weight 09/26/23 1828 (!) 303 lb (137.4 kg)     Height 09/26/23 1828 5\' 2"  (1.575 m)     Head Circumference --      Peak Flow --      Pain Score 09/26/23 1827 8     Pain Loc --      Pain Education --      Exclude from Growth Chart --    No data found.  Updated Vital Signs BP (!) 151/90 (BP Location: Right Wrist)   Pulse 73   Temp 97.9 F (36.6 C) (Oral)   Resp 18   Ht 5\' 2"  (1.575 m)   Wt (!) 137.4 kg   SpO2 95%   BMI 55.42  kg/m   Visual Acuity Right Eye Distance:   Left Eye Distance:   Bilateral Distance:    Right Eye Near:   Left Eye Near:    Bilateral Near:     Physical Exam Vitals reviewed.  Constitutional:      General: She is not in acute distress.    Appearance: She is not toxic-appearing.  HENT:     Mouth/Throat:     Mouth: Mucous membranes are moist.  Eyes:     Extraocular Movements: Extraocular movements intact.     Pupils: Pupils are equal, round, and reactive to light.  Cardiovascular:     Rate and Rhythm: Normal  rate and regular rhythm.     Heart sounds: No murmur heard. Pulmonary:     Effort: Pulmonary effort is normal.     Breath sounds: Normal breath sounds.  Musculoskeletal:     Comments: There is swelling of both lower legs, right more than left.  On both legs also there is some confluent erythema and rash but is more extensive on the right lower leg than on the left.  Both legs also have some open ulcerations now all along the erythema but it is more pronounced on the right.  No purulent drainage noted on exam.  Skin:    Coloration: Skin is not jaundiced or pale.  Neurological:     General: No focal deficit present.     Mental Status: She is alert and oriented to person, place, and time.  Psychiatric:        Behavior: Behavior normal.      UC Treatments / Results  Labs (all labs ordered are listed, but only abnormal results are displayed) Labs Reviewed - No data to display  EKG   Radiology No results found.  Procedures Procedures (including critical care time)  Medications Ordered in UC Medications - No data to display  Initial Impression / Assessment and Plan / UC Course  I have reviewed the triage vital signs and the nursing notes.  Pertinent labs & imaging results that were available during my care of the patient were reviewed by me and considered in my medical decision making (see chart for details).     Staff and I applied nonstick dressings and Coban to both legs where the open areas were.  Wound care is explained  I sent Augmentin in as I think she has a cellulitis now.  She is given contact information for wound care and she will follow-up with her primary care Final Clinical Impressions(s) / UC Diagnoses   Final diagnoses:  Cellulitis of lower extremity, unspecified laterality  Venous stasis ulcer of ankle limited to breakdown of skin, unspecified laterality, unspecified whether varicose veins present [facility])     Discharge Instructions      Take  amoxicillin-clavulanate 875 mg--1 tab twice daily with food for 7 days  Please followup with your primary care.  Also please call the wound care clinic  Change your bandages once a day.  Go to the emergency room if you begin having high fevers     ED Prescriptions     Medication Sig Dispense Auth. Provider   amoxicillin-clavulanate (AUGMENTIN) 875-125 MG tablet Take 1 tablet by mouth 2 (two) times daily for 7 days. 14 tablet Ewel Lona, Janace Aris, MD      PDMP not reviewed this encounter.   Zenia Resides, MD 09/26/23 2037

## 2023-09-26 NOTE — Discharge Instructions (Signed)
Take amoxicillin-clavulanate 875 mg--1 tab twice daily with food for 7 days  Please followup with your primary care.  Also please call the wound care clinic  Change your bandages once a day.  Go to the emergency room if you begin having high fevers

## 2023-09-26 NOTE — ED Triage Notes (Signed)
Bilateral Leg pain but worse in the right leg X2 days. Patient having numbness in the hands and sharp pain in the upper chest and head. Having stinging in the legs,like a bee sting.   Patient has tried Gabapentin, aspirin, corn starch powder topical, with little relief.

## 2023-09-30 ENCOUNTER — Telehealth (INDEPENDENT_AMBULATORY_CARE_PROVIDER_SITE_OTHER): Payer: Self-pay | Admitting: Family Medicine

## 2023-09-30 ENCOUNTER — Ambulatory Visit (INDEPENDENT_AMBULATORY_CARE_PROVIDER_SITE_OTHER): Payer: PPO | Admitting: Family Medicine

## 2023-09-30 NOTE — Telephone Encounter (Signed)
12/30 Pt never received medication for diabetes at the pharmacy. [facility]

## 2023-10-01 DIAGNOSIS — R600 Localized edema: Secondary | ICD-10-CM | POA: Diagnosis not present

## 2023-10-01 DIAGNOSIS — E261 Secondary hyperaldosteronism: Secondary | ICD-10-CM | POA: Diagnosis not present

## 2023-10-01 DIAGNOSIS — E1165 Type 2 diabetes mellitus with hyperglycemia: Secondary | ICD-10-CM | POA: Diagnosis not present

## 2023-10-01 DIAGNOSIS — I5022 Chronic systolic (congestive) heart failure: Secondary | ICD-10-CM | POA: Diagnosis not present

## 2023-10-01 DIAGNOSIS — E1159 Type 2 diabetes mellitus with other circulatory complications: Secondary | ICD-10-CM | POA: Diagnosis not present

## 2023-10-01 DIAGNOSIS — I872 Venous insufficiency (chronic) (peripheral): Secondary | ICD-10-CM | POA: Diagnosis not present

## 2023-10-17 ENCOUNTER — Ambulatory Visit (INDEPENDENT_AMBULATORY_CARE_PROVIDER_SITE_OTHER): Payer: PPO | Admitting: Family Medicine

## 2023-10-27 ENCOUNTER — Encounter (HOSPITAL_COMMUNITY): Payer: Self-pay | Admitting: Emergency Medicine

## 2023-10-27 ENCOUNTER — Other Ambulatory Visit: Payer: Self-pay

## 2023-10-27 ENCOUNTER — Ambulatory Visit (HOSPITAL_COMMUNITY)
Admission: EM | Admit: 2023-10-27 | Discharge: 2023-10-27 | Disposition: A | Discharge status: Home / Self Care | Payer: PPO | Attending: Family Medicine | Admitting: Family Medicine

## 2023-10-27 DIAGNOSIS — N3001 Acute cystitis with hematuria: Secondary | ICD-10-CM | POA: Diagnosis not present

## 2023-10-27 DIAGNOSIS — M5442 Lumbago with sciatica, left side: Secondary | ICD-10-CM | POA: Diagnosis not present

## 2023-10-27 LAB — POCT URINALYSIS DIP (MANUAL ENTRY)
Bilirubin, UA: NEGATIVE
Glucose, UA: NEGATIVE mg/dL
Ketones, POC UA: NEGATIVE mg/dL
Nitrite, UA: NEGATIVE
Protein Ur, POC: >=300 mg/dL — AB
Spec Grav, UA: 1.025 (ref 1.010–1.025)
Urobilinogen, UA: 0.2 U/dL
pH, UA: 5.5 (ref 5.0–8.0)

## 2023-10-27 MED ORDER — KETOROLAC TROMETHAMINE 30 MG/ML IJ SOLN
INTRAMUSCULAR | Status: AC
  Filled 2023-10-27: qty 1

## 2023-10-27 MED ORDER — KETOROLAC TROMETHAMINE 30 MG/ML IJ SOLN
30.0000 mg | Freq: Once | INTRAMUSCULAR | Status: AC
  Administered 2023-10-27: 30 mg via INTRAMUSCULAR

## 2023-10-27 MED ORDER — PREDNISONE 20 MG PO TABS: 40.0000 mg | ORAL_TABLET | Freq: Every day | ORAL | 0 refills | Status: AC

## 2023-10-27 MED ORDER — CYCLOBENZAPRINE HCL 5 MG PO TABS: 5.0000 mg | ORAL_TABLET | Freq: Three times a day (TID) | ORAL | 0 refills | Status: DC | PRN

## 2023-10-27 MED ORDER — DEXAMETHASONE SODIUM PHOSPHATE 10 MG/ML IJ SOLN
INTRAMUSCULAR | Status: AC
  Filled 2023-10-27: qty 1

## 2023-10-27 MED ORDER — NITROFURANTOIN MONOHYD MACRO 100 MG PO CAPS: 100.0000 mg | ORAL_CAPSULE | Freq: Two times a day (BID) | ORAL | 0 refills | Status: DC

## 2023-10-27 MED ORDER — DEXAMETHASONE SODIUM PHOSPHATE 10 MG/ML IJ SOLN
10.0000 mg | Freq: Once | INTRAMUSCULAR | Status: AC
  Administered 2023-10-27: 10 mg via INTRAMUSCULAR

## 2023-10-27 NOTE — ED Triage Notes (Signed)
Pain started last night took gabapentin, tramadol and aspiring.  However continued to have pain all night and has continued today.  Patient reports aching pain.  Pain is in left lower back, around left side of abdomen to lower left abdomen and radiating down left leg to her foot.

## 2023-10-27 NOTE — Discharge Instructions (Addendum)
Abdominal pain with left lower back that radiates down the left leg. This appears to be from 2 different issues. The urine does appear to show a urinary tract infection and we will treat this with antibiotics. The back pain that radiates down the leg appears to be consistent with sciatica which will treat with medication here and at home. We will treat as follows:   Decadron injection given today. This is a steroid to help with inflammation and pain. Toradol injection given today. This is a medication to help with pain. This is not a narcotic.   Macrobid 100 mg twice daily for 5 days, this is an antibiotic for an urinary tract infection. Prednisone 40 mg (2 tablets) daily for 3 days. Take this in the morning. Start on 1/27. Monitor your blood sugar closely while on the steroids as they can cause it to go up.  Flexeril 5 mg every 8 hours as needed for muscle spasms.  Use caution as this medication can cause drowsiness.  May use heat or ice on the area for comfort Light stretching to help with the pain May need to consider follow up with an orthopedist if the symptoms do not improve.  Return to urgent care or PCP if symptoms worsen or fail to resolve.

## 2023-10-27 NOTE — ED Provider Notes (Signed)
[facility]    CSN: [id number] Arrival date & time: 10/27/23  1137      History   Chief Complaint No chief complaint on file.   HPI Whitney [name] is a 68 y.o. female.   68 year old female who presents urgent care with complaints of abdominal pain in the left side and lower abdomen along with left back pain with radiation down the leg.  This started a few days ago but last night it was severe.  She was having trouble getting up and down due to the pain.  She did not sleep last night due to pain.  She has had a history of back problems and has had surgery on her back in the past.  She has not noticed any significant dysuria or hematuria.  She does have diabetes.  She recently was treated for cellulitis in the lower extremities.  She currently denies fevers, chills, bowel or bladder incontinence, lower extremities giving out, numbness or tingling.     Past Medical History:  Diagnosis Date   Arthritis    left knee,right hip   Back pain    Cardiac arrest (HCC)    CHF (congestive heart failure) (HCC)    Constipation    Depression    Diabetes mellitus    GERD (gastroesophageal reflux disease)    Glaucoma    Glaucoma    H/O cardiac arrest 11/2011   PEA   Heart murmur    Hyperlipidemia    Hypertension    Joint pain    Myocardial infarction [facility]) 2013   Palpitations    Sleep apnea    Bring machine,mask and tubing   Swelling     Patient Active Problem List   Diagnosis Date Noted   (HFpEF) heart failure with preserved ejection fraction (HCC) 07/22/2023   Iron deficiency anemia secondary to inadequate dietary iron intake 10/30/2022   BMI 50.0-59.9, adult (HCC) 10/30/2022   Obesity, Beginning BMI 59.14 10/30/2022   Dehydration 09/12/2022   Allergic rhinitis due to pollen 03/02/2022   Atherosclerotic heart disease of native coronary artery without angina pectoris 03/02/2022   Chronic venous stasis dermatitis 03/02/2022   Decreased estrogen level 03/02/2022    Long term (current) use of insulin (HCC) 03/02/2022   Neuropathy 03/02/2022   Peripheral venous insufficiency 03/02/2022   Varicose veins of lower extremity with inflammation 03/02/2022   Plantar flexed metatarsal bone of left foot 06/26/2021   Plantar flexed metatarsal bone of right foot 06/26/2021   Diabetes mellitus (HCC) 06/20/2021   Vitamin D deficiency 05/15/2021   Detrusor muscle hypertonia 11/04/2019   Disorder of mitral valve 11/04/2019   Hypertension associated with type 2 diabetes mellitus (HCC) 11/04/2019   Gastro-esophageal reflux disease without esophagitis 11/04/2019   Hay fever 11/04/2019   Pure hypercholesterolemia 11/04/2019   Type 2 diabetes mellitus with hyperglycemia (HCC) 11/04/2019   Hypokalemia 06/25/2019   History of cardiomyopathy 06/25/2019   Pain due to onychomycosis of toenails of both feet 03/18/2019   Porokeratosis 03/18/2019   Acute right lumbar radiculopathy 07/25/2017   At risk for adverse drug event 07/16/2017   Humerus fracture 07/12/2017   Morbid obesity due to excess calories (HCC) 12/14/2015   Chronic systolic heart failure (HCC) 12/14/2015   Chest pain 12/14/2015   Angina decubitus (HCC) 12/14/2015   Class 3 severe obesity with serious comorbidity and body mass index (BMI) of 50.0 to 59.9 in adult (HCC) 01/06/2013   Arthritis of knee, degenerative 01/06/2013   Osteoarthritis of both knees  01/06/2013   OSA (obstructive sleep apnea) 11/30/2011   Cardiac arrest (HCC) 11/13/2011   Diabetes mellitus type 2, uncontrolled 11/13/2011    Past Surgical History:  Procedure Laterality Date   BACK SURGERY     COLONOSCOPY N/A 09/12/2016   Procedure: COLONOSCOPY;  Surgeon: Vida Rigger, MD;  Location: [facility];  Service: Endoscopy;  Laterality: N/A;   CYSTOSCOPY W/ URETERAL STENT PLACEMENT Left 01/04/2013   Procedure: CYSTOSCOPY WITH RETROGRADE PYELOGRAM/URETERAL STENT PLACEMENT;  Surgeon: Valetta Fuller, MD;  Location: [facility];  Service: Urology;   Laterality: Left;   CYSTOSCOPY WITH RETROGRADE PYELOGRAM, URETEROSCOPY AND STENT PLACEMENT Left 01/26/2013   Procedure: CYSTOSCOPY, JJ STENT REMOVAL, LEFT URETEROSCOPY, ;  Surgeon: Valetta Fuller, MD;  Location: [facility];  Service: Urology;  Laterality: Left;  CYSTO, JJ STENT REMOVAL, LEFT URETEROSCOPY    FOOT SURGERY     HERNIA REPAIR     HOLMIUM LASER APPLICATION Left 01/26/2013   Procedure: HOLMIUM LASER LITHOTRIPSY ;  Surgeon: Valetta Fuller, MD;  Location: [facility];  Service: Urology;  Laterality: Left;   KNEE SURGERY     left   PARTIAL HYSTERECTOMY      OB History     Gravida  0   Para  0   Term  0   Preterm  0   AB  0   Living  0      SAB  0   IAB  0   Ectopic  0   Multiple  0   Live Births  0            Home Medications    Prior to Admission medications   Medication Sig Start Date End Date Taking? Authorizing Provider  amLODipine (NORVASC) 5 MG tablet Take 1 tablet (5 mg total) by mouth daily. 08/19/23   Nahser, Deloris Ping, MD  aspirin EC 81 MG tablet Take 1 tablet (81 mg total) by mouth daily. Swallow whole. 06/23/20   Meriam Sprague, MD  atorvastatin (LIPITOR) 40 MG tablet Take 1 tablet (40 mg total) by mouth daily. 09/12/23   Quintella Reichert, MD  BD INSULIN SYRINGE U/F 31G X 5/16" 0.5 ML MISC as directed.  03/02/19   [provider]  blood glucose meter kit and supplies KIT Dispense based on patient and insurance preference. Use up to four times daily as directed. (FOR ICD-9 250.00, 250.01). 09/02/17   Medina-Vargas, Monina C, NP  cyclobenzaprine (FLEXERIL) 5 MG tablet Take 1 tablet (5 mg total) by mouth every 8 (eight) hours as needed for muscle spasms. 10/27/23  Yes Dayln Tugwell A, PA-C  furosemide (LASIX) 40 MG tablet TAKE 1 TABLET BY MOUTH EVERY DAY 09/20/23   Nahser, Deloris Ping, MD  gabapentin (NEURONTIN) 300 MG capsule Take 300 mg by mouth See admin instructions. Take 300 mg by mouth two times a day and an additional 300 mg once daily as  needed for nerve pain 03/11/19   [provider]  Lancets (ONETOUCH DELICA PLUS LANCET33G) MISC USE TO TEST YOUR BLOOD SUGAR 2 TIMES A DAY 11/23/19   [provider]  lidocaine (LIDODERM) 5 % Place 1 patch onto the skin daily. Remove & Discard patch within 12 hours or as directed by MD 12/07/22   Raspet, Denny Peon K, PA-C  lisinopril (ZESTRIL) 40 MG tablet Take 1 tablet (40 mg total) by mouth daily. 03/06/23   Meriam Sprague, MD  loratadine (CLARITIN) 10 MG tablet Take 10 mg by mouth daily. 12/07/19  [provider]  metoprolol succinate (TOPROL-XL) 100 MG 24 hr tablet Take 1 tablet (100 mg total) by mouth daily before breakfast. Take with or immediately following a meal. 08/29/17   Medina-Vargas, Monina C, NP  mometasone (ELOCON) 0.1 % ointment PLEASE SEE ATTACHED FOR DETAILED DIRECTIONS 03/20/23   [provider]  nitrofurantoin, macrocrystal-monohydrate, (MACROBID) 100 MG capsule Take 1 capsule (100 mg total) by mouth 2 (two) times daily. 10/27/23  Yes Mccrae Speciale A, PA-C  NOVOLIN N 100 UNIT/ML injection Inject 35 Units into the skin 2 (two) times daily after a meal. 35 units in the morning and 15 units at night time. 08/08/18   [provider]  [facility] ULTRA test strip 2 (two) times daily. 01/14/20   [provider]  oxybutynin (DITROPAN) 5 MG tablet Take 1 tablet (5 mg total) by mouth daily. 08/29/17   Medina-Vargas, Monina C, NP  pantoprazole (PROTONIX) 40 MG tablet Take 1 tablet (40 mg total) by mouth daily. 08/29/17   Medina-Vargas, Monina C, NP  predniSONE (DELTASONE) 20 MG tablet Take 2 tablets (40 mg total) by mouth daily with breakfast for 3 days. 10/27/23 10/30/23 Yes Cristela Stalder A, PA-C  sodium chloride (OCEAN) 0.65 % SOLN nasal spray Place 2 sprays into both nostrils in the morning and at bedtime. 10/31/22   Quintella Reichert, MD  spironolactone (ALDACTONE) 25 MG tablet Take 1 tablet (25 mg total) by mouth daily. 08/05/23   Nahser, Deloris Ping, MD  traMADol (ULTRAM) 50 MG tablet Take 2 tablets (100 mg total) by mouth every 8 (eight) hours as needed for severe pain. 08/29/17   Medina-Vargas, Monina C, NP  Vitamin D, Ergocalciferol, (DRISDOL) 1.25 MG (50000 UNIT) CAPS capsule Take 1 capsule (50,000 Units total) by mouth every 7 (seven) days. 08/22/23   Wilder Glade, MD    Family History Family History  Problem Relation Age of Onset   Obesity Mother    Kidney disease Mother    Heart disease Mother    Hyperlipidemia Mother    Hypertension Mother    Diabetes Mother    Asthma Mother    Anxiety disorder Father    Depression Father    Heart disease Father    Hyperlipidemia Father    Hypertension Father    Diabetes Father     Social History Social History   Tobacco Use   Smoking status: Never   Smokeless tobacco: Never  Vaping Use   Vaping status: Never Used  Substance Use Topics   Alcohol use: No   Drug use: No     Allergies   Contrast media [iodinated contrast media], Empagliflozin, and Metformin   Review of Systems Review of Systems  Constitutional:  Negative for chills and fever.  HENT:  Negative for ear pain and sore throat.   Eyes:  Negative for pain and visual disturbance.  Respiratory:  Negative for cough and shortness of breath.   Cardiovascular:  Negative for chest pain and palpitations.  Gastrointestinal:  Negative for abdominal pain and vomiting.  Genitourinary:  Negative for dysuria and hematuria.  Musculoskeletal:  Negative for arthralgias and back pain.  Skin:  Negative for color change and rash.  Neurological:  Negative for seizures and syncope.  All other systems reviewed and are negative.    Physical Exam Triage Vital Signs ED Triage Vitals  Encounter Vitals Group     BP 10/27/23 1335 (!) 197/93     Systolic BP Percentile --      Diastolic  BP Percentile --      Pulse Rate 10/27/23 1335 62     Resp 10/27/23 1335 (!) 32     Temp 10/27/23 1335 98 F (36.7 C)     Temp Source  10/27/23 1335 Oral     SpO2 10/27/23 1335 98 %     Weight --      Height --      Head Circumference --      Peak Flow --      Pain Score 10/27/23 1337 7     Pain Loc --      Pain Education --      Exclude from Growth Chart --    No data found.  Updated Vital Signs BP (!) 197/93 (BP Location: Right Arm) Comment (BP Location): forearm, regular cuff  Pulse 62   Temp 98 F (36.7 C) (Oral)   Resp (!) 32   SpO2 98%   Visual Acuity Right Eye Distance:   Left Eye Distance:   Bilateral Distance:    Right Eye Near:   Left Eye Near:    Bilateral Near:     Physical Exam Vitals and nursing note reviewed.  Constitutional:      General: She is not in acute distress.    Appearance: She is well-developed.  HENT:     Head: Normocephalic and atraumatic.  Eyes:     Conjunctiva/sclera: Conjunctivae normal.  Cardiovascular:     Rate and Rhythm: Normal rate and regular rhythm.     Heart sounds: No murmur heard. Pulmonary:     Effort: Pulmonary effort is normal. No respiratory distress.     Breath sounds: Normal breath sounds.  Abdominal:     General: Bowel sounds are normal.     Palpations: Abdomen is soft.     Tenderness: There is no abdominal tenderness.    Musculoskeletal:        General: No swelling.     Cervical back: Neck supple.     Lumbar back: Spasms and tenderness present. Decreased range of motion.     Comments: Radiating pain down the left leg  Skin:    General: Skin is warm and dry.     Capillary Refill: Capillary refill takes less than 2 seconds.  Neurological:     Mental Status: She is alert.  Psychiatric:        Mood and Affect: Mood normal.      UC Treatments / Results  Labs (all labs ordered are listed, but only abnormal results are displayed) Labs Reviewed  POCT URINALYSIS DIP (MANUAL ENTRY) - Abnormal; Notable for the following components:      Result Value   Color, UA orange (*)    Blood, UA moderate (*)    Protein Ur, POC >=300 (*)     Leukocytes, UA Trace (*)    All other components within normal limits    EKG   Radiology No results found.  Procedures Procedures (including critical care time)  Medications Ordered in UC Medications  ketorolac (TORADOL) 30 MG/ML injection 30 mg (has no administration in time range)  dexamethasone (DECADRON) injection 10 mg (has no administration in time range)    Initial Impression / Assessment and Plan / UC Course  I have reviewed the triage vital signs and the nursing notes.  Pertinent labs & imaging results that were available during my care of the patient were reviewed by me and considered in my medical decision making (see chart for details).  Acute left-sided low back pain with left-sided sciatica  Acute cystitis with hematuria   Abdominal pain with left lower back that radiates down the left leg. This appears to be from 2 different issues. The urine does appear to show a urinary tract infection and we will treat this with antibiotics. The back pain that radiates down the leg appears to be consistent with sciatica which will treat with medication here and at home. We will treat as follows:   Decadron injection given today. This is a steroid to help with inflammation and pain. Toradol injection given today. This is a medication to help with pain. This is not a narcotic.   Macrobid 100 mg twice daily for 5 days, this is an antibiotic for an urinary tract infection. Prednisone 40 mg (2 tablets) daily for 3 days. Take this in the morning. Start on 1/27. Monitor your blood sugar closely while on the steroids as they can cause it to go up. Flexeril 5 mg every 8 hours as needed for muscle spasms.  Use caution as this medication can cause drowsiness.  May use heat or ice on the area for comfort Light stretching to help with the pain May need to consider follow up with an orthopedist if the symptoms do not improve.  Return to urgent care or PCP if symptoms worsen or fail to  resolve.    Final Clinical Impressions(s) / UC Diagnoses   Final diagnoses:  Acute left-sided low back pain with left-sided sciatica  Acute cystitis with hematuria     Discharge Instructions      Abdominal pain with left lower back that radiates down the left leg. This appears to be from 2 different issues. The urine does appear to show a urinary tract infection and we will treat this with antibiotics. The back pain that radiates down the leg appears to be consistent with sciatica which will treat with medication here and at home. We will treat as follows:   Decadron injection given today. This is a steroid to help with inflammation and pain. Toradol injection given today. This is a medication to help with pain. This is not a narcotic.   Macrobid 100 mg twice daily for 5 days, this is an antibiotic for an urinary tract infection. Prednisone 40 mg (2 tablets) daily for 3 days. Take this in the morning. Start on 1/27 Flexeril 5 mg every 8 hours as needed for muscle spasms.  Use caution as this medication can cause drowsiness.  May use heat or ice on the area for comfort Light stretching to help with the pain May need to consider follow up with an orthopedist if the symptoms do not improve.  Return to urgent care or PCP if symptoms worsen or fail to resolve.     ED Prescriptions     Medication Sig Dispense Auth. Provider   predniSONE (DELTASONE) 20 MG tablet Take 2 tablets (40 mg total) by mouth daily with breakfast for 3 days. 6 tablet Janalyn Higby A, PA-C   cyclobenzaprine (FLEXERIL) 5 MG tablet Take 1 tablet (5 mg total) by mouth every 8 (eight) hours as needed for muscle spasms. 30 tablet Cha Gomillion A, PA-C   nitrofurantoin, macrocrystal-monohydrate, (MACROBID) 100 MG capsule Take 1 capsule (100 mg total) by mouth 2 (two) times daily. 10 capsule Landis Martins, [place]      PDMP not reviewed this encounter.   Landis Martins, [place] 10/27/23 1435

## 2023-11-18 NOTE — Progress Notes (Unsigned)
Cardiology Office Note:    Date:  11/20/2023  ID:  Whitney [name], DOB 09-13-1956, MRN [id number] PCP: Darrow Bussing, MD  [facility] Providers Cardiologist:  Armanda Magic, MD Sleep Medicine:  Armanda Magic, MD       Patient Profile:      Heart failure with improved ejection fraction  Non-ischemic cardiomyopathy  TTE 2/13: EF 30-35 (in setting of resp/cardiac arrest)  TTE 06/2019: EF 50 TTE 06/16/20: EF 50, global HK, mild LVH, Gr 1 DD, normal RVSF, mild LAE, mild AS (mean 9 mmHg, visually appears to be mild AS)  TTE 07/28/21: mild apical HK, EF 55-60, AV sclerosis, mean AV 14 mmHg TTE 12/22/21: EF 50-55, no RWMA, mild LVH, Gr 1 DD, NL RVSF, mild LAE, triival MR, mild AS (mean 15, Vmax 260 cm/s, DI 0.42), RAP 8 Hx of respiratory arrest >> PEA in 2013 Cath 2006: no CAD   Myoview 12/27/15: EF 60, inf-lat scar, no ischemia low risk  Myoview 07/28/21: normal perfusion, low risk EF 61 Hypertension  Hyperlipidemia  Myalgias with higher dose statins Diabetes mellitus, insulin dependent  OSA, CPAP  Morbid obesity  Mild aortic stenosis (Echocardiogram 9/21)         Discussed the use of AI scribe software for clinical note transcription with the patient, who gave verbal consent to proceed. History of Present Illness Whitney [name] is a 68 year old female with heart failure with improved ejection fraction who presents with fatigue and episodes of chest tightness. She was referred by her PCP for evaluation of her EKG findings.  She experiences fatigue and episodes of chest tightness. The fatigue has been present for about three weeks, with rapid onset of tiredness during minimal physical activity. The chest tightness occurs once or twice, primarily when lying down, and lasts only a few seconds. No significant chest pain, shortness of breath, or other associated symptoms accompany these episodes. She experiences pain from her left hip down to her feet, which she attributes to arthritis  or possibly a back issue. No recent weight gain is noted, although her weight was 303 lbs at home and 305 lbs at the clinic. She denies swelling in her legs currently. She was seen in the wound clinic but her previous wounds have healed. Occasional shortness of breath and fatigue are reported.     Review of Systems  Constitutional: Negative for fever.  Respiratory:  Negative for cough and hemoptysis.   Gastrointestinal:  Negative for hematochezia and melena.  Genitourinary:  Negative for hematuria.  -See HPI     Studies Reviewed:        Results LABS Hb: 10.3 g/dL (96/2952) TSH: 8.41 mIU/L (06/2023)  EKG from primary care personally reviewed: Normal sinus rhythm, left axis deviation, LVH, nonspecific ST-T changes (11/07/2023)   Risk Assessment/Calculations:             Physical Exam:   VS:  BP (!) 102/50   Pulse 68   Ht 5\' 1"  (1.549 m)   Wt (!) 305 lb 3.2 oz (138.4 kg)   SpO2 98%   BMI 57.67 kg/m    Wt Readings from Last 3 Encounters:  11/20/23 (!) 305 lb 3.2 oz (138.4 kg)  09/26/23 (!) 303 lb (137.4 kg)  08/22/23 (!) 316 lb (143.3 kg)    Constitutional:      Appearance: Healthy appearance. Not in distress.  Neck:     Vascular: No JVR.  Pulmonary:     Breath sounds: Normal breath  sounds. No wheezing. No rales.  Cardiovascular:     Normal rate. Regular rhythm.     Murmurs: There is a grade 2/6 systolic murmur at the URSB.  Edema:    Peripheral edema present.    Pretibial: bilateral 2+ edema of the pretibial area.    Ankle: bilateral 2+ edema of the ankle. Abdominal:     Palpations: Abdomen is soft.        Assessment and Plan:   Assessment & Plan Precordial chest pain She notes a few episodes of chest discomfort recently.  She also notes fatigue/exercise intolerance.  Overall, she is fairly sedentary and uses a walker.  She has difficulty with her knees.  EKG from primary care reviewed.  No acute changes are noted.  Cardiac catheterization 2006 showed no CAD.   Last nuclear stress test in 2022 was low risk.  Echocardiogram in March 2023 demonstrated EF 50-55 and mild aortic stenosis.  Her symptoms are possibly cardiac.  I have recommended proceeding with follow-up echocardiogram and stress PET. -Arrange 2D echocardiogram -Arrange stress PET -Follow-up 6 months or sooner if testing abnormal.  She knows to contact us if her symptoms worsen. Chronic heart failure with preserved ejection fraction (HCC) EF 50-55% in March 2023, previously as low as 30-35% in 2013. Mild diastolic dysfunction and mild aortic stenosis. History of PEA arrest in 2013.  She notes exercise intolerance.  She has significant lower extremity edema which is likely multifactorial and related to heart failure as well as venous insufficiency. -Arrange 2D echocardiogram as noted -Obtain BMET, BNP today -Continue Lasix 40mg  daily, Lisinopril 40mg  daily, and Spironolactone 25mg  daily. -Increase Lasix if BNP elevated  Hypertension associated with type 2 diabetes mellitus (HCC) Blood pressure controlled. -Continue Amlodipine 5mg  daily, Lasix 40mg  daily, Lisinopril 40mg  daily, and Metoprolol succinate 100mg  daily.     Informed Consent   Shared Decision Making/Informed Consent The risks [chest pain, shortness of breath, cardiac arrhythmias, dizziness, blood pressure fluctuations, myocardial infarction, stroke/transient ischemic attack, nausea, vomiting, allergic reaction, radiation exposure, metallic taste sensation and life-threatening complications (estimated to be 1 in 10,000)], benefits (risk stratification, diagnosing coronary artery disease, treatment guidance) and alternatives of a cardiac PET stress test were discussed in detail with Ms. Ficek and she agrees to proceed.     Dispo:  Return in about 6 months (around 05/19/2024) for Routine Follow Up, w/ Dr. Mayford Knife.  Signed, Tereso Newcomer, PA-C

## 2023-11-19 ENCOUNTER — Encounter (HOSPITAL_BASED_OUTPATIENT_CLINIC_OR_DEPARTMENT_OTHER): Payer: PPO | Admitting: Internal Medicine

## 2023-11-20 ENCOUNTER — Ambulatory Visit: Payer: PPO | Attending: Physician Assistant | Admitting: Physician Assistant

## 2023-11-20 ENCOUNTER — Encounter: Payer: Self-pay | Admitting: Physician Assistant

## 2023-11-20 VITALS — BP 102/50 | HR 68 | Ht 61.0 in | Wt 305.2 lb

## 2023-11-20 DIAGNOSIS — I5032 Chronic diastolic (congestive) heart failure: Secondary | ICD-10-CM

## 2023-11-20 DIAGNOSIS — E1159 Type 2 diabetes mellitus with other circulatory complications: Secondary | ICD-10-CM

## 2023-11-20 DIAGNOSIS — I152 Hypertension secondary to endocrine disorders: Secondary | ICD-10-CM

## 2023-11-20 DIAGNOSIS — R079 Chest pain, unspecified: Secondary | ICD-10-CM | POA: Diagnosis not present

## 2023-11-20 DIAGNOSIS — R072 Precordial pain: Secondary | ICD-10-CM | POA: Diagnosis not present

## 2023-11-20 LAB — BASIC METABOLIC PANEL
BUN/Creatinine Ratio: 20 (ref 12–28)
BUN: 21 mg/dL (ref 8–27)
CO2: 25 mmol/L (ref 20–29)
Calcium: 9.7 mg/dL (ref 8.7–10.3)
Chloride: 105 mmol/L (ref 96–106)
Creatinine, Ser: 1.05 mg/dL — ABNORMAL HIGH (ref 0.57–1.00)
Glucose: 93 mg/dL (ref 70–99)
Potassium: 4 mmol/L (ref 3.5–5.2)
Sodium: 144 mmol/L (ref 134–144)
eGFR: 58 mL/min/{1.73_m2} — ABNORMAL LOW (ref >59)

## 2023-11-20 LAB — PRO B NATRIURETIC PEPTIDE: NT-Pro BNP: 592 pg/mL — ABNORMAL HIGH (ref 0–301)

## 2023-11-20 NOTE — Assessment & Plan Note (Signed)
Blood pressure controlled. -Continue Amlodipine 5mg  daily, Lasix 40mg  daily, Lisinopril 40mg  daily, and Metoprolol succinate 100mg  daily.

## 2023-11-20 NOTE — Assessment & Plan Note (Signed)
EF 50-55% in March 2023, previously as low as 30-35% in 2013. Mild diastolic dysfunction and mild aortic stenosis. History of PEA arrest in 2013.  She notes exercise intolerance.  She has significant lower extremity edema which is likely multifactorial and related to heart failure as well as venous insufficiency. -Arrange 2D echocardiogram as noted -Obtain BMET, BNP today -Continue Lasix 40mg  daily, Lisinopril 40mg  daily, and Spironolactone 25mg  daily. -Increase Lasix if BNP elevated

## 2023-11-20 NOTE — Patient Instructions (Signed)
Medication Instructions:  Your physician recommends that you continue on your current medications as directed. Please refer to the Current Medication list given to you today.  *If you need a refill on your cardiac medications before your next appointment, please call your pharmacy*   Lab Work: TODAY:  BMET & PRO BNP  If you have labs (blood work) drawn today and your tests are completely normal, you will receive your results only by: MyChart Message (if you have MyChart) OR [name] in the mail If you have any lab test that is abnormal or we need to change your treatment, we will call you to review the results.   Testing/Procedures: Your physician has requested that you have an echocardiogram. Echocardiography is a painless test that uses sound waves to create images of your heart. It provides your doctor with information about the size and shape of your heart and how well your heart's chambers and valves are working. This procedure takes approximately one hour. There are no restrictions for this procedure. Please do NOT wear cologne, perfume, aftershave, or lotions (deodorant is allowed). Please arrive 15 minutes prior to your appointment time.  Please note: We ask at that you not bring children with you during ultrasound (echo/ vascular) testing. Due to room size and safety concerns, children are not allowed in the ultrasound rooms during exams. Our front office staff cannot provide observation of children in our lobby area while testing is being conducted. An adult accompanying a patient to their appointment will only be allowed in the ultrasound room at the discretion of the ultrasound technician under special circumstances. We apologize for any inconvenience.       Please report to Radiology at the [facility] Main Entrance 30 minutes early for your test.  [address] [place], [place] [address]                         OR   Please report to Radiology at [facility] Main Entrance, medical mall, 30 mins prior to your test.  [address]  [place], [place]  How to Prepare for Your Cardiac PET/CT Stress Test:  Nothing to eat or drink, except water, 3 hours prior to arrival time.  NO caffeine/decaffeinated products, or chocolate 12 hours prior to arrival. (Please note decaffeinated beverages (teas/coffees) still contain caffeine).  If you have caffeine within 12 hours prior, the test will need to be rescheduled.  Medication instructions: Do not take nitrates (isosorbide mononitrate, Ranexa) the day before or day of test Do not take tamsulosin the day before or morning of test Hold theophylline containing medications for 12 hours. Hold Dipyridamole 48 hours prior to the test.  Diabetic Preparation: If able to eat breakfast prior to 3 hour fasting, you may take all medications, including your insulin. Do not worry if you miss your breakfast dose of insulin - start at your next meal. If you do not eat prior to 3 hour fast-Hold all diabetes (oral and insulin) medications. Patients who wear a continuous glucose monitor MUST remove the device prior to scanning.  You may take your remaining medications with water.  NO perfume, cologne or lotion on chest or abdomen area. FEMALES - Please avoid wearing dresses to this appointment.  Total time is 1 to 2 hours; you may want to bring reading material for the waiting time.  IF YOU THINK YOU MAY BE PREGNANT, OR ARE NURSING PLEASE INFORM  THE TECHNOLOGIST.  In preparation for your appointment, medication and supplies will be purchased.  Appointment availability is limited, so if you need to cancel or reschedule, please call the Radiology Department Scheduler at [phone] 24 hours in advance to avoid a cancellation fee of $100.00  What to Expect When you Arrive:  Once you arrive and check in for your appointment, you will be taken to a preparation room within the Radiology  Department.  A technologist or Nurse will obtain your medical history, verify that you are correctly prepped for the exam, and explain the procedure.  Afterwards, an IV will be started in your arm and electrodes will be placed on your skin for EKG monitoring during the stress portion of the exam. Then you will be escorted to the PET/CT scanner.  There, staff will get you positioned on the scanner and obtain a blood pressure and EKG.  During the exam, you will continue to be connected to the EKG and blood pressure machines.  A small, safe amount of a radioactive tracer will be injected in your IV to obtain a series of pictures of your heart along with an injection of a stress agent.    After your Exam:  It is recommended that you eat a meal and drink a caffeinated beverage to counter act any effects of the stress agent.  Drink plenty of fluids for the remainder of the day and urinate frequently for the first couple of hours after the exam.  Your doctor will inform you of your test results within 7-10 business days.  For more information and frequently asked questions, please visit our website: [email or web address]  For questions about your test or how to prepare for your test, please call: Cardiac Imaging Nurse Navigators Office: [phone]    Follow-Up: At [facility], you and your health needs are our priority.  As part of our continuing mission to provide you with exceptional heart care, we have created designated Provider Care Teams.  These Care Teams include your primary Cardiologist (physician) and Advanced Practice Providers (APPs -  Physician Assistants and Nurse Practitioners) who all work together to provide you with the care you need, when you need it.  We recommend signing up for the patient portal called "MyChart".  Sign up information is provided on this After Visit Summary.  MyChart is used to connect with patients for Virtual Visits (Telemedicine).  Patients  are able to view lab/test results, encounter notes, upcoming appointments, etc.  Non-urgent messages can be sent to your provider as well.   To learn more about what you can do with MyChart, go to [email or web address].    Your next appointment:   6 month(s)  Provider:   Armanda Magic, MD or Tereso Newcomer, PA-C         Other Instructions     1st Floor: - Lobby - Registration  - Pharmacy  - Lab - Cafe  2nd Floor: - PV Lab - Diagnostic Testing (echo, CT, nuclear med)  3rd Floor: - Vacant  4th Floor: - TCTS (cardiothoracic surgery) - AFib Clinic - Structural Heart Clinic - Vascular Surgery  - Vascular Ultrasound  5th Floor: - HeartCare Cardiology (general and EP) - Clinical Pharmacy for coumadin, hypertension, lipid, weight-loss medications, and med management appointments    Valet parking services will be available as well.

## 2023-11-26 SURGERY — Surgical Case
Anesthesia: *Unknown

## 2023-12-02 DIAGNOSIS — M5451 Vertebrogenic low back pain: Secondary | ICD-10-CM | POA: Diagnosis not present

## 2023-12-11 ENCOUNTER — Ambulatory Visit (INDEPENDENT_AMBULATORY_CARE_PROVIDER_SITE_OTHER): Payer: PPO | Admitting: Podiatry

## 2023-12-11 ENCOUNTER — Encounter: Payer: Self-pay | Admitting: Podiatry

## 2023-12-11 VITALS — Ht 61.0 in | Wt 305.0 lb

## 2023-12-11 DIAGNOSIS — M79674 Pain in right toe(s): Secondary | ICD-10-CM

## 2023-12-11 DIAGNOSIS — M79675 Pain in left toe(s): Secondary | ICD-10-CM | POA: Diagnosis not present

## 2023-12-11 DIAGNOSIS — Q828 Other specified congenital malformations of skin: Secondary | ICD-10-CM | POA: Diagnosis not present

## 2023-12-11 DIAGNOSIS — B351 Tinea unguium: Secondary | ICD-10-CM | POA: Diagnosis not present

## 2023-12-11 DIAGNOSIS — E1165 Type 2 diabetes mellitus with hyperglycemia: Secondary | ICD-10-CM | POA: Diagnosis not present

## 2023-12-11 NOTE — Progress Notes (Signed)
 This patient returns to my office for at risk foot care.  This patient requires this care by a professional since this patient will be at risk due to having diabetes.  Patient has painful corn fifth toe right foot and painful callus on the outside of both feet  B/L.  This patient is unable to cut nails herself since the patient cannot reach her nails.These nails are painful walking and wearing shoes.  This patient presents for at risk foot care today.  General Appearance  Alert, conversant and in no acute stress.  Vascular  Defeered due to wearing compression socks.  Patient has not been seen for 9 months.  Neurologic  Senn-Weinstein monofilament wire test diminished  bilaterally. Muscle power within normal limits bilaterally.  Nails Thick disfigured discolored nails with subungual debris  from hallux to fifth toes bilaterally. No evidence of bacterial infection or drainage bilaterally.  Orthopedic  No limitations of motion  feet .  No crepitus or effusions noted.  No bony pathology or digital deformities noted.  Adducto varus fifth digit  Right foot.  Plantar flexed fifth metatarsal  B/L.  Skin  normotropic skin with no porokeratosis noted bilaterally.  No signs of infections or ulcers noted. Porokeratosis sub 5th  B/L.  Corn 5th digit right foot.    Onychomycosis  Pain in right toes  Pain in left toes  Porokeratosis B/L  Corn fifth toe right foot.  Consent was obtained for treatment procedures.   Mechanical debridement of nails 1-5  bilaterally performed with a nail nipper.  Filed with dremel without incident. Debridement of callus  Both feet with # 15 blade and dremel tool.  Patient to be seen for diabetic shoes this morning.   Return office visit   3 months                   Told patient to return for periodic foot care and evaluation due to potential at risk complications.   Helane Gunther DPM

## 2023-12-12 DIAGNOSIS — M5451 Vertebrogenic low back pain: Secondary | ICD-10-CM | POA: Diagnosis not present

## 2023-12-16 ENCOUNTER — Ambulatory Visit (HOSPITAL_COMMUNITY): Payer: PPO | Attending: Physician Assistant

## 2023-12-16 DIAGNOSIS — I5032 Chronic diastolic (congestive) heart failure: Secondary | ICD-10-CM | POA: Insufficient documentation

## 2023-12-16 DIAGNOSIS — E1159 Type 2 diabetes mellitus with other circulatory complications: Secondary | ICD-10-CM | POA: Diagnosis not present

## 2023-12-16 DIAGNOSIS — I152 Hypertension secondary to endocrine disorders: Secondary | ICD-10-CM | POA: Diagnosis not present

## 2023-12-16 DIAGNOSIS — R072 Precordial pain: Secondary | ICD-10-CM | POA: Insufficient documentation

## 2023-12-16 LAB — ECHOCARDIOGRAM COMPLETE
AR max vel: 1.05 cm2
AV Area VTI: 1.15 cm2
AV Area mean vel: 1.11 cm2
AV Mean grad: 13 mmHg
AV Peak grad: 24.6 mmHg
Ao pk vel: 2.48 m/s
Area-P 1/2: 3.05 cm2
S' Lateral: 4.1 cm

## 2023-12-17 ENCOUNTER — Encounter: Payer: Self-pay | Admitting: Physician Assistant

## 2023-12-17 DIAGNOSIS — I350 Nonrheumatic aortic (valve) stenosis: Secondary | ICD-10-CM | POA: Insufficient documentation

## 2023-12-19 DIAGNOSIS — M5451 Vertebrogenic low back pain: Secondary | ICD-10-CM | POA: Diagnosis not present

## 2023-12-25 ENCOUNTER — Telehealth: Payer: Self-pay | Admitting: *Deleted

## 2023-12-25 ENCOUNTER — Other Ambulatory Visit: Payer: Self-pay | Admitting: Physician Assistant

## 2023-12-25 ENCOUNTER — Encounter: Payer: Self-pay | Admitting: *Deleted

## 2023-12-25 DIAGNOSIS — R079 Chest pain, unspecified: Secondary | ICD-10-CM

## 2023-12-25 NOTE — Telephone Encounter (Signed)
 Pt aware that we have changed the test to a Lexiscan.  She is aware we will send her instructions via mychart.    You are BEING scheduled for a Myocardial Perfusion Imaging Study.  Please arrive 15 minutes prior to your appointment time for registration and insurance purposes.  The test will take approximately 3 to 4 hours to complete; you may bring reading material.  If someone comes with you to your appointment, they will need to remain in the main lobby due to limited space in the testing area. **If you are pregnant or breastfeeding, please notify the nuclear lab prior to your appointment**  How to prepare for your Myocardial Perfusion Test: Do not eat or drink 3 hours prior to your test, except you may have water. Do not consume products containing caffeine (regular or decaffeinated) 12 hours prior to your test. (ex: coffee, chocolate, sodas, tea). Do bring a list of your current medications with you.  If not listed below, you may take your medications as normal. Do not take metoprolol (Lopressor, Toprol) for 24 hours prior to the test.  Bring the medication to your appointment as you may be required to take it once the test is complete. Do wear comfortable clothes (no dresses or overalls) and walking shoes, tennis shoes preferred (No heels or open toe shoes are allowed). Do NOT wear cologne, perfume, aftershave, or lotions (deodorant is allowed). If these instructions are not followed, your test will have to be rescheduled.

## 2023-12-25 NOTE — Telephone Encounter (Signed)
-----   Message from Nurse Daisy Blossom sent at 12/25/2023 12:40 PM EDT ----- Will loop in [facility]   Please change from cardiac PET to lexiscan myoview for timing reasons  Thank you Huntley Dec ----- Message ----- From: Quintella Reichert, MD Sent: 12/25/2023   9:02 AM EDT To: Lennie Odor, RN; Beatrice Lecher, PA-C; #  I would recommend switching her to a Steffanie Dunn ----- Message ----- From: Lennie Odor, RN Sent: 12/25/2023   8:57 AM EDT To: Quintella Reichert, MD; Beatrice Lecher, PA-C; #  Delphia Grates and Dr. Mayford Knife,  Our PET sites are booked!  [facility] earliest is 5/1  [facility] earliest is 6/4  I apologize for the inconvenience Thank you, Daisy Blossom ----- Message ----- From: Kennon Rounds Sent: 12/24/2023   5:06 PM EDT To: Lennie Odor, RN; Dorette Grate, RN  Pt has follow up with Dr. Mayford Knife 01/10/24. Stress PET is not until May. Can we see if it can be done prior to her visit with Dr. Mayford Knife?  Thanks!

## 2023-12-26 ENCOUNTER — Telehealth (INDEPENDENT_AMBULATORY_CARE_PROVIDER_SITE_OTHER): Payer: Self-pay | Admitting: Family Medicine

## 2023-12-26 DIAGNOSIS — M5451 Vertebrogenic low back pain: Secondary | ICD-10-CM | POA: Diagnosis not present

## 2023-12-26 NOTE — Telephone Encounter (Signed)
 03/27 pt having a lot of medical issues wanted to pass it on to [facility] as to why she has not been in

## 2023-12-26 NOTE — Telephone Encounter (Signed)
 Left message on patient's voicemail stating I was calling to check on her. I hope she will start feeling better soon and no need to return the call.

## 2023-12-30 DIAGNOSIS — M5451 Vertebrogenic low back pain: Secondary | ICD-10-CM | POA: Diagnosis not present

## 2023-12-30 NOTE — Telephone Encounter (Signed)
 We received this message late due to it not being routed properly. I have contacted the patient and patient returned call (addressed in separate encounter).  No further action needed

## 2023-12-31 ENCOUNTER — Encounter (HOSPITAL_COMMUNITY): Payer: Self-pay

## 2024-01-01 ENCOUNTER — Telehealth: Payer: Self-pay

## 2024-01-01 NOTE — Telephone Encounter (Signed)
 Left vm to schedule diabetic shoe pick up

## 2024-01-02 DIAGNOSIS — M5451 Vertebrogenic low back pain: Secondary | ICD-10-CM | POA: Diagnosis not present

## 2024-01-03 ENCOUNTER — Telehealth (HOSPITAL_COMMUNITY): Payer: Self-pay | Admitting: *Deleted

## 2024-01-03 NOTE — Telephone Encounter (Signed)
 Left message on voicemail per DPR in reference to upcoming appointment scheduled on 01/08/2024 at 8:15 with detailed instructions given per Myocardial Perfusion Study Information Sheet for the test. LM to arrive 15 minutes early, and that it is imperative to arrive on time for appointment to keep from having the test rescheduled. If you need to cancel or reschedule your appointment, please call the office within 24 hours of your appointment. Failure to do so may result in a cancellation of your appointment, and a $50 no show fee. Phone number given for call back for any questions.

## 2024-01-07 ENCOUNTER — Ambulatory Visit (HOSPITAL_COMMUNITY)

## 2024-01-07 ENCOUNTER — Telehealth: Payer: Self-pay

## 2024-01-07 NOTE — Telephone Encounter (Signed)
 HTA APPROVAL 01/01/24-03/31/24 HTA AUTH # [id number]

## 2024-01-08 ENCOUNTER — Ambulatory Visit (HOSPITAL_COMMUNITY): Attending: Physician Assistant

## 2024-01-08 ENCOUNTER — Encounter (HOSPITAL_COMMUNITY): Payer: Self-pay | Admitting: Family Medicine

## 2024-01-08 ENCOUNTER — Ambulatory Visit (HOSPITAL_COMMUNITY)

## 2024-01-08 DIAGNOSIS — R079 Chest pain, unspecified: Secondary | ICD-10-CM | POA: Insufficient documentation

## 2024-01-08 MED ORDER — TECHNETIUM TC 99M TETROFOSMIN IV KIT
31.0000 | PACK | Freq: Once | INTRAVENOUS | Status: AC | PRN
  Administered 2024-01-08: 31 via INTRAVENOUS

## 2024-01-08 MED ORDER — REGADENOSON 0.4 MG/5ML IV SOLN
0.4000 mg | Freq: Once | INTRAVENOUS | Status: AC
Start: 2024-01-08 — End: 2024-01-08
  Administered 2024-01-08: 0.4 mg via INTRAVENOUS

## 2024-01-09 ENCOUNTER — Ambulatory Visit (HOSPITAL_COMMUNITY)

## 2024-01-09 ENCOUNTER — Encounter: Payer: Self-pay | Admitting: Physician Assistant

## 2024-01-09 LAB — MYOCARDIAL PERFUSION IMAGING
LV dias vol: 120 mL (ref 46–106)
LV sys vol: 44 mL
Nuc Stress EF: 63 %
Peak HR: 83 {beats}/min
Rest HR: 60 {beats}/min
Rest Nuclear Isotope Dose: 32.6 mCi
SDS: 1
SRS: 0
SSS: 1
ST Depression (mm): 0 mm
Stress Nuclear Isotope Dose: 31 mCi
TID: 0.75

## 2024-01-09 MED ORDER — TECHNETIUM TC 99M TETROFOSMIN IV KIT
32.6000 | PACK | Freq: Once | INTRAVENOUS | Status: AC | PRN
  Administered 2024-01-09: 32.6 via INTRAVENOUS

## 2024-01-10 ENCOUNTER — Encounter: Payer: Self-pay | Admitting: Cardiology

## 2024-01-10 ENCOUNTER — Ambulatory Visit: Payer: PPO | Attending: Cardiology | Admitting: Cardiology

## 2024-01-10 VITALS — BP 130/67 | HR 68 | Resp 16 | Ht 61.0 in | Wt 306.0 lb

## 2024-01-10 DIAGNOSIS — G4733 Obstructive sleep apnea (adult) (pediatric): Secondary | ICD-10-CM | POA: Diagnosis not present

## 2024-01-10 DIAGNOSIS — I10 Essential (primary) hypertension: Secondary | ICD-10-CM | POA: Diagnosis not present

## 2024-01-10 NOTE — Progress Notes (Addendum)
 Cardiology Office Note:    Date:  08/02/2020   ID:  JENAVIVE LAMBOY, DOB 08-08-1956, MRN [id number] The patient was identified using 2 identifiers. Date:  01/10/2024   ID:  MARYBELLE GIRALDO, DOB 07-Oct-1955, MRN [id number]  PCP:  Darrow Bussing, MD  Cardiologist:  Armanda Magic, MD    Referring MD: Darrow Bussing, MD   Chief Complaint  Patient presents with   Sleep Apnea   Hypertension    History of Present Illness:    EDOM SCHMUHL is a 68 y.o. female with a hx of OSA on CPAP, depression, DM, GERD, HTN and OSA. She underwent sleep study in 2013 showing mild OSA with an AHI of 9.7/hr and underwent CPAP titration to 7cm H2O.  Unfortunately her PAP broke in 2018 and her DME had moved out of town and she never called to get another device.    She underwent split night sleep study showing severe OSA with an AHI of 44.8/hr and O2 sats as low as 82%.  She underwent CPAP titration to 15cm H2O.    She is doing well with her PAP device and thinks that she has gotten used to it.  She tolerates the mask and feels the pressure is adequate.  Since going on PAP she feels rested in the am and has no significant daytime sleepiness.  She denies any significant mouth or nasal dryness.  She has problems with allergies resulting in sneezing and sinus drainage with the pollen.  She does not think that he snores.    Past Medical History:  Diagnosis Date   Arthritis    left knee,right hip   Back pain    Cardiac arrest (HCC)    CHF (congestive heart failure) (HCC)    Constipation    Depression    Diabetes mellitus    GERD (gastroesophageal reflux disease)    Glaucoma    Glaucoma    H/O cardiac arrest 11/2011   PEA   Heart murmur    History of nuclear stress test    Lexiscan MPI 01/09/24: no ischemia or infarction, EF 63; low risk   Hyperlipidemia    Hypertension    Joint pain    Myocardial infarction [facility]) 2013   Palpitations    Sleep apnea    Bring machine,mask and tubing   Swelling     Past  Surgical History:  Procedure Laterality Date   BACK SURGERY     COLONOSCOPY N/A 09/12/2016   Procedure: COLONOSCOPY;  Surgeon: Vida Rigger, MD;  Location: [facility];  Service: Endoscopy;  Laterality: N/A;   CYSTOSCOPY W/ URETERAL STENT PLACEMENT Left 01/04/2013   Procedure: CYSTOSCOPY WITH RETROGRADE PYELOGRAM/URETERAL STENT PLACEMENT;  Surgeon: Valetta Fuller, MD;  Location: [facility];  Service: Urology;  Laterality: Left;   CYSTOSCOPY WITH RETROGRADE PYELOGRAM, URETEROSCOPY AND STENT PLACEMENT Left 01/26/2013   Procedure: CYSTOSCOPY, JJ STENT REMOVAL, LEFT URETEROSCOPY, ;  Surgeon: Valetta Fuller, MD;  Location: [facility];  Service: Urology;  Laterality: Left;  CYSTO, JJ STENT REMOVAL, LEFT URETEROSCOPY    FOOT SURGERY     HERNIA REPAIR     HOLMIUM LASER APPLICATION Left 01/26/2013   Procedure: HOLMIUM LASER LITHOTRIPSY ;  Surgeon: Valetta Fuller, MD;  Location: [facility];  Service: Urology;  Laterality: Left;   KNEE SURGERY     left   PARTIAL HYSTERECTOMY      Current Medications: Current Meds  Medication Sig   amLODipine (NORVASC) 5 MG tablet Take 1 tablet (5  mg total) by mouth daily.   aspirin EC 81 MG tablet Take 1 tablet (81 mg total) by mouth daily. Swallow whole.   atorvastatin (LIPITOR) 40 MG tablet Take 1 tablet (40 mg total) by mouth daily.   BD INSULIN SYRINGE U/F 31G X 5/16" 0.5 ML MISC as directed.    blood glucose meter kit and supplies KIT Dispense based on patient and insurance preference. Use up to four times daily as directed. (FOR ICD-9 250.00, 250.01).   cyclobenzaprine (FLEXERIL) 5 MG tablet Take 1 tablet (5 mg total) by mouth every 8 (eight) hours as needed for muscle spasms.   furosemide (LASIX) 40 MG tablet TAKE 1 TABLET BY MOUTH EVERY DAY   gabapentin (NEURONTIN) 300 MG capsule Take 300 mg by mouth See admin instructions. Take 300 mg by mouth two times a day and an additional 300 mg once daily as needed for nerve pain   Lancets (ONETOUCH DELICA PLUS LANCET33G) MISC USE  TO TEST YOUR BLOOD SUGAR 2 TIMES A DAY   lisinopril (ZESTRIL) 40 MG tablet Take 1 tablet (40 mg total) by mouth daily.   loratadine (CLARITIN) 10 MG tablet Take 10 mg by mouth daily.   metoprolol succinate (TOPROL-XL) 100 MG 24 hr tablet Take 1 tablet (100 mg total) by mouth daily before breakfast. Take with or immediately following a meal.   mometasone (ELOCON) 0.1 % ointment PLEASE SEE ATTACHED FOR DETAILED DIRECTIONS   NOVOLIN N 100 UNIT/ML injection Inject 35 Units into the skin 2 (two) times daily after a meal. 35 units in the morning and 15 units at night time.   ONETOUCH ULTRA test strip 2 (two) times daily.   oxybutynin (DITROPAN) 5 MG tablet Take 1 tablet (5 mg total) by mouth daily.   pantoprazole (PROTONIX) 40 MG tablet Take 1 tablet (40 mg total) by mouth daily.   spironolactone (ALDACTONE) 25 MG tablet Take 1 tablet (25 mg total) by mouth daily.   traMADol (ULTRAM) 50 MG tablet Take 2 tablets (100 mg total) by mouth every 8 (eight) hours as needed for severe pain.   Vitamin D, Ergocalciferol, (DRISDOL) 1.25 MG (50000 UNIT) CAPS capsule Take 1 capsule (50,000 Units total) by mouth every 7 (seven) days.     Allergies:   Contrast media [iodinated contrast media], Empagliflozin, and Metformin   Social History   Socioeconomic History   Marital status: Divorced    Spouse name: Not on file   Number of children: Not on file   Years of education: Not on file   Highest education level: Not on file  Occupational History   Occupation: Retired  Tobacco Use   Smoking status: Never   Smokeless tobacco: Never  Vaping Use   Vaping status: Never Used  Substance and Sexual Activity   Alcohol use: No   Drug use: No   Sexual activity: Never  Other Topics Concern   Not on file  Social History Narrative   Not on file   Social Drivers of Health   Financial Resource Strain: Not on file  Food Insecurity: No Food Insecurity (07/03/2019)   Hunger Vital Sign    Worried About Running Out  of Food in the Last Year: Never true    Ran Out of Food in the Last Year: Never true  Transportation Needs: No Transportation Needs (07/03/2019)   PRAPARE - Administrator, Civil Service (Medical): No    Lack of Transportation (Non-Medical): No  Physical Activity: Not on file  Stress:  Not on file  Social Connections: Not on file     Family History: The patient's family history includes Anxiety disorder in her father; Asthma in her mother; Depression in her father; Diabetes in her father and mother; Heart disease in her father and mother; Hyperlipidemia in her father and mother; Hypertension in her father and mother; Kidney disease in her mother; Obesity in her mother.  ROS:   Please see the history of present illness.    ROS  All other systems reviewed and negative.   EKGs/Labs/Other Studies Reviewed:    The following studies were reviewed today: PAP compliance download   Recent Labs: 05/07/2023: Hemoglobin 10.3; Platelets 301 08/22/2023: ALT 10; BNP 42.8 11/20/2023: BUN 21; Creatinine, Ser 1.05; NT-Pro BNP 592; Potassium 4.0; Sodium 144   Recent Lipid Panel    Component Value Date/Time   CHOL 184 03/29/2021 0000   TRIG 127 03/29/2021 0000   HDL 52 03/29/2021 0000   LDLCALC 109 03/29/2021 0000     Physical Exam:    VS:  BP 130/67 (BP Location: Left Wrist, Patient Position: Sitting, Cuff Size: Large)   Pulse 68   Resp 16   Ht 5\' 1"  (1.549 m)   Wt (!) 306 lb (138.8 kg)   SpO2 98%   BMI 57.82 kg/m     Wt Readings from Last 3 Encounters:  01/10/24 (!) 306 lb (138.8 kg)  01/08/24 (!) 305 lb (138.3 kg)  12/11/23 (!) 305 lb (138.3 kg)    GEN: Well nourished, well developed in no acute distress HEENT: Normal NECK: No JVD; No carotid bruits LYMPHATICS: No lymphadenopathy CARDIAC:RRR, no rubs, gallops 2/6 SM at RUSB RESPIRATORY:  Clear to auscultation without rales, wheezing or rhonchi  ABDOMEN: Soft, non-tender, non-distended MUSCULOSKELETAL:  trace edema  with chronic venous stasis changes with brawny edema; No deformity  SKIN: Warm and dry NEUROLOGIC:  Alert and oriented x 3 PSYCHIATRIC:  Normal affect  ASSESSMENT:    1. OSA (obstructive sleep apnea)   2. Essential hypertension     PLAN:     In order of problems listed above:  #OSA - The patient is tolerating PAP therapy well without any problems. The PAP download performed by his DME was personally reviewed and interpreted by me today and showed an AHI of 0.2 /hr on 16 cm H2O with 53% compliance in using more than 4 hours nightly.  The patient has been using and benefiting from PAP use and will continue to benefit from therapy.  -I encouraged her to be more compliant with her device -encouraged her to use nasal saline spray 2 sprays each nostril twice daily -start Flonase 1 spray  BID  #HTN -BP controlled on exam today -Continue prescription drug managed with amlodipine 5 mg daily, lisinopril 40 mg daily, Toprol XL 100 mg daily and spironolactone 25 mg daily with as needed refills -I have personally reviewed and interpreted outside labs performed by patient's PCP which showed serum creatinine 1.05 and potassium 4 on 11/20/2023  Followup with me in 6 months  Medication Adjustments/Labs and Tests Ordered: Current medicines are reviewed at length with the patient today.  Concerns regarding medicines are outlined above.  No orders of the defined types were placed in this encounter.  No orders of the defined types were placed in this encounter.   Signed, Armanda Magic, MD  01/10/2024 8:58 AM    [facility]

## 2024-01-10 NOTE — Patient Instructions (Signed)
 Medication Instructions:  Saline nasal spray.Marland Kitchen 2 sprays in each nostril twice a day Flonase nasal spray... 1 spray in each nostril every morning  *If you need a refill on your cardiac medications before your next appointment, please call your pharmacy*  Lab Work:  If you have labs (blood work) drawn today and your tests are completely normal, you will receive your results only by: MyChart Message (if you have MyChart) OR [name] in the mail If you have any lab test that is abnormal or we need to change your treatment, we will call you to review the results.  Testing/Procedures:   Follow-Up: At [facility], you and your health needs are our priority.  As part of our continuing mission to provide you with exceptional heart care, our providers are all part of one team.  This team includes your primary Cardiologist (physician) and Advanced Practice Providers or APPs (Physician Assistants and Nurse Practitioners) who all work together to provide you with the care you need, when you need it.  Your next appointment:  September 2025 We recommend signing up for the patient portal called "MyChart".  Sign up information is provided on this After Visit Summary.  MyChart is used to connect with patients for Virtual Visits (Telemedicine).  Patients are able to view lab/test results, encounter notes, upcoming appointments, etc.  Non-urgent messages can be sent to your provider as well.   To learn more about what you can do with MyChart, go to [email or web address].   Other Instructions       1st Floor: - Lobby - Registration  - Pharmacy  - Lab - Cafe  2nd Floor: - PV Lab - Diagnostic Testing (echo, CT, nuclear med)  3rd Floor: - Vacant  4th Floor: - TCTS (cardiothoracic surgery) - AFib Clinic - Structural Heart Clinic - Vascular Surgery  - Vascular Ultrasound  5th Floor: - HeartCare Cardiology (general and EP) - Clinical Pharmacy for coumadin, hypertension, lipid,  weight-loss medications, and med management appointments    Valet parking services will be available as well.

## 2024-01-27 DIAGNOSIS — I872 Venous insufficiency (chronic) (peripheral): Secondary | ICD-10-CM | POA: Diagnosis not present

## 2024-01-27 DIAGNOSIS — E7800 Pure hypercholesterolemia, unspecified: Secondary | ICD-10-CM | POA: Diagnosis not present

## 2024-01-27 DIAGNOSIS — I5022 Chronic systolic (congestive) heart failure: Secondary | ICD-10-CM | POA: Diagnosis not present

## 2024-01-27 DIAGNOSIS — E1159 Type 2 diabetes mellitus with other circulatory complications: Secondary | ICD-10-CM | POA: Diagnosis not present

## 2024-01-27 DIAGNOSIS — R600 Localized edema: Secondary | ICD-10-CM | POA: Diagnosis not present

## 2024-02-02 ENCOUNTER — Emergency Department (HOSPITAL_COMMUNITY)

## 2024-02-02 ENCOUNTER — Other Ambulatory Visit: Payer: Self-pay

## 2024-02-02 ENCOUNTER — Emergency Department (HOSPITAL_COMMUNITY)
Admission: EM | Admit: 2024-02-02 | Discharge: 2024-02-02 | Disposition: A | Discharge status: Home / Self Care | Attending: Emergency Medicine | Admitting: Emergency Medicine

## 2024-02-02 DIAGNOSIS — E669 Obesity, unspecified: Secondary | ICD-10-CM | POA: Diagnosis not present

## 2024-02-02 DIAGNOSIS — I517 Cardiomegaly: Secondary | ICD-10-CM | POA: Diagnosis not present

## 2024-02-02 DIAGNOSIS — R079 Chest pain, unspecified: Secondary | ICD-10-CM | POA: Diagnosis not present

## 2024-02-02 DIAGNOSIS — L0390 Cellulitis, unspecified: Secondary | ICD-10-CM | POA: Diagnosis not present

## 2024-02-02 DIAGNOSIS — I10 Essential (primary) hypertension: Secondary | ICD-10-CM | POA: Diagnosis not present

## 2024-02-02 DIAGNOSIS — Z7982 Long term (current) use of aspirin: Secondary | ICD-10-CM | POA: Diagnosis not present

## 2024-02-02 DIAGNOSIS — M7989 Other specified soft tissue disorders: Secondary | ICD-10-CM | POA: Diagnosis present

## 2024-02-02 DIAGNOSIS — R140 Abdominal distension (gaseous): Secondary | ICD-10-CM | POA: Diagnosis not present

## 2024-02-02 DIAGNOSIS — R600 Localized edema: Secondary | ICD-10-CM | POA: Insufficient documentation

## 2024-02-02 LAB — COMPREHENSIVE METABOLIC PANEL WITH GFR
ALT: 11 U/L (ref 0–44)
AST: 15 U/L (ref 15–41)
Albumin: 3.3 g/dL — ABNORMAL LOW (ref 3.5–5.0)
Alkaline Phosphatase: 107 U/L (ref 38–126)
Anion gap: 7 (ref 5–15)
BUN: 13 mg/dL (ref 8–23)
CO2: 28 mmol/L (ref 22–32)
Calcium: 9.2 mg/dL (ref 8.9–10.3)
Chloride: 107 mmol/L (ref 98–111)
Creatinine, Ser: 0.77 mg/dL (ref 0.44–1.00)
GFR, Estimated: >60 mL/min (ref >60)
Glucose, Bld: 247 mg/dL — ABNORMAL HIGH (ref 70–99)
Potassium: 3.9 mmol/L (ref 3.5–5.1)
Sodium: 142 mmol/L (ref 135–145)
Total Bilirubin: 1.3 mg/dL — ABNORMAL HIGH (ref 0.0–1.2)
Total Protein: 7.2 g/dL (ref 6.5–8.1)

## 2024-02-02 LAB — CBC WITH DIFFERENTIAL/PLATELET
Abs Immature Granulocytes: 0.05 10*3/uL (ref 0.00–0.07)
Basophils Absolute: 0 10*3/uL (ref 0.0–0.1)
Basophils Relative: 0 %
Eosinophils Absolute: 0.1 10*3/uL (ref 0.0–0.5)
Eosinophils Relative: 1 %
HCT: 33.2 % — ABNORMAL LOW (ref 36.0–46.0)
Hemoglobin: 10 g/dL — ABNORMAL LOW (ref 12.0–15.0)
Immature Granulocytes: 1 %
Lymphocytes Relative: 14 %
Lymphs Abs: 1.2 10*3/uL (ref 0.7–4.0)
MCH: 25.8 pg — ABNORMAL LOW (ref 26.0–34.0)
MCHC: 30.1 g/dL (ref 30.0–36.0)
MCV: 85.8 fL (ref 80.0–100.0)
Monocytes Absolute: 0.6 10*3/uL (ref 0.1–1.0)
Monocytes Relative: 6 %
Neutro Abs: 6.9 10*3/uL (ref 1.7–7.7)
Neutrophils Relative %: 78 %
Platelets: 268 10*3/uL (ref 150–400)
RBC: 3.87 MIL/uL (ref 3.87–5.11)
RDW: 14.9 % (ref 11.5–15.5)
WBC: 8.9 10*3/uL (ref 4.0–10.5)
nRBC: 0 % (ref 0.0–0.2)

## 2024-02-02 LAB — BRAIN NATRIURETIC PEPTIDE: B Natriuretic Peptide: 90.9 pg/mL (ref 0.0–100.0)

## 2024-02-02 LAB — TROPONIN I (HIGH SENSITIVITY): Troponin I (High Sensitivity): 14 ng/L (ref <18)

## 2024-02-02 MED ORDER — FUROSEMIDE 10 MG/ML IJ SOLN
40.0000 mg | Freq: Once | INTRAMUSCULAR | Status: AC
  Administered 2024-02-02: 40 mg via INTRAVENOUS
  Filled 2024-02-02: qty 4

## 2024-02-02 MED ORDER — MORPHINE SULFATE (PF) 4 MG/ML IV SOLN
4.0000 mg | Freq: Once | INTRAVENOUS | Status: AC
  Administered 2024-02-02: 4 mg via INTRAVENOUS
  Filled 2024-02-02: qty 1

## 2024-02-02 NOTE — ED Notes (Signed)
PTAR called Paperwork printed.

## 2024-02-02 NOTE — ED Provider Notes (Signed)
 [place] EMERGENCY DEPARTMENT AT [facility] Provider Note   CSN: [id number] Arrival date & time: 02/02/24  4098     History  Chief Complaint  Patient presents with   Leg Swelling    Whitney [name] is a 68 y.o. female.  68 year old female who presents with increased lower extremity edema.  Patient states she missed her dose of Lasix  due to taken a car trip.  Also notes a fluttering in her chest which has been there for several hours.  Denies any associated dyspnea or diaphoresis.  No radiation of this discomfort.  No pleuritic pain.  Denies any distal numbness or tingling to her feet.                Home Medications Prior to Admission medications   Medication Sig Start Date End Date Taking? Authorizing Provider  amLODipine  (NORVASC ) 5 MG tablet Take 1 tablet (5 mg total) by mouth daily. 08/19/23   Nahser, Lela Purple, MD  aspirin  EC 81 MG tablet Take 1 tablet (81 mg total) by mouth daily. Swallow whole. 06/23/20   Sonny Dust, MD  atorvastatin  (LIPITOR) 40 MG tablet Take 1 tablet (40 mg total) by mouth daily. 09/12/23   Jacqueline Matsu, MD  BD INSULIN  SYRINGE U/F 31G X 5/16" 0.5 ML MISC as directed.  03/02/19   [provider]  blood glucose meter kit and supplies KIT Dispense based on patient and insurance preference. Use up to four times daily as directed. (FOR ICD-9 250.00, 250.01). 09/02/17   Medina-Vargas, Monina C, NP  cyclobenzaprine  (FLEXERIL ) 5 MG tablet Take 1 tablet (5 mg total) by mouth every 8 (eight) hours as needed for muscle spasms. 10/27/23   White, Elizabeth A, PA-C  furosemide  (LASIX ) 40 MG tablet TAKE 1 TABLET BY MOUTH EVERY DAY 09/20/23   Nahser, Lela Purple, MD  gabapentin  (NEURONTIN ) 300 MG capsule Take 300 mg by mouth See admin instructions. Take 300 mg by mouth two times a day and an additional 300 mg once daily as needed for nerve pain 03/11/19   [provider]  Lancets (ONETOUCH DELICA PLUS LANCET33G) MISC USE TO TEST  YOUR BLOOD SUGAR 2 TIMES A DAY 11/23/19   [provider]  lisinopril  (ZESTRIL ) 40 MG tablet Take 1 tablet (40 mg total) by mouth daily. 03/06/23   Sonny Dust, MD  loratadine (CLARITIN) 10 MG tablet Take 10 mg by mouth daily. 12/07/19   [provider]  metoprolol  succinate (TOPROL -XL) 100 MG 24 hr tablet Take 1 tablet (100 mg total) by mouth daily before breakfast. Take with or immediately following a meal. 08/29/17   Medina-Vargas, Monina C, NP  mometasone (ELOCON) 0.1 % ointment PLEASE SEE ATTACHED FOR DETAILED DIRECTIONS 03/20/23   [provider]  NOVOLIN N 100 UNIT/ML injection Inject 35 Units into the skin 2 (two) times daily after a meal. 35 units in the morning and 15 units at night time. 08/08/18   [provider]  [facility] ULTRA test strip 2 (two) times daily. 01/14/20   [provider]  oxybutynin  (DITROPAN ) 5 MG tablet Take 1 tablet (5 mg total) by mouth daily. 08/29/17   Medina-Vargas, Monina C, NP  pantoprazole  (PROTONIX ) 40 MG tablet Take 1 tablet (40 mg total) by mouth daily. 08/29/17   Medina-Vargas, Monina C, NP  spironolactone  (ALDACTONE ) 25 MG tablet Take 1 tablet (25 mg total) by mouth daily. 08/05/23   Nahser, Lela Purple, MD  traMADol  (ULTRAM ) 50 MG tablet Take  2 tablets (100 mg total) by mouth every 8 (eight) hours as needed for severe pain. 08/29/17   Medina-Vargas, Monina C, NP  Vitamin D , Ergocalciferol , (DRISDOL ) 1.25 MG (50000 UNIT) CAPS capsule Take 1 capsule (50,000 Units total) by mouth every 7 (seven) days. 08/22/23   Jasmine Mesi D, MD      Allergies    Contrast media [iodinated contrast media], Empagliflozin, and Metformin    Review of Systems   Review of Systems  All other systems reviewed and are negative.   Physical Exam Updated Vital Signs BP (!) 149/126   Pulse 60   Temp 98.2 F (36.8 C) (Oral)   Resp 13   Ht 1.549 m (5\' 1" )   Wt (!) 138.3 kg   SpO2 100%   BMI 57.63 kg/m  Physical Exam Vitals and  nursing note reviewed.  Constitutional:      General: She is not in acute distress.    Appearance: Normal appearance. She is well-developed. She is not toxic-appearing.  HENT:     Head: Normocephalic and atraumatic.  Eyes:     General: Lids are normal.     Conjunctiva/sclera: Conjunctivae normal.     Pupils: Pupils are equal, round, and reactive to light.  Neck:     Thyroid : No thyroid  mass.     Trachea: No tracheal deviation.  Cardiovascular:     Rate and Rhythm: Normal rate and regular rhythm.     Heart sounds: Normal heart sounds. No murmur heard.    No gallop.  Pulmonary:     Effort: Pulmonary effort is normal. No respiratory distress.     Breath sounds: Normal breath sounds. No stridor. No decreased breath sounds, wheezing, rhonchi or rales.  Abdominal:     General: There is no distension.     Palpations: Abdomen is soft.     Tenderness: There is no abdominal tenderness. There is no rebound.  Musculoskeletal:        General: No tenderness. Normal range of motion.     Cervical back: Normal range of motion and neck supple.     Comments: 3+ lower extremity pitting edema  Skin:    General: Skin is warm and dry.     Findings: No abrasion or rash.  Neurological:     Mental Status: She is alert and oriented to person, place, and time. Mental status is at baseline.     GCS: GCS eye subscore is 4. GCS verbal subscore is 5. GCS motor subscore is 6.     Cranial Nerves: No cranial nerve deficit.     Sensory: No sensory deficit.     Motor: Motor function is intact.  Psychiatric:        Attention and Perception: Attention normal.        Speech: Speech normal.        Behavior: Behavior normal.     ED Results / Procedures / Treatments   Labs (all labs ordered are listed, but only abnormal results are displayed) Labs Reviewed  BRAIN NATRIURETIC PEPTIDE  CBC WITH DIFFERENTIAL/PLATELET  COMPREHENSIVE METABOLIC PANEL WITH GFR  TROPONIN I (HIGH SENSITIVITY)     EKG None  Radiology No results found.  Procedures Procedures    Medications Ordered in ED Medications  furosemide  (LASIX ) injection 40 mg (has no administration in time range)  morphine  (PF) 4 MG/ML injection 4 mg (has no administration in time range)    ED Course/ Medical Decision Making/ A&P  Medical Decision Making Amount and/or Complexity of Data Reviewed Labs: ordered. Radiology: ordered. ECG/medicine tests: ordered.  Risk Prescription drug management.   Patient's chest x-ray shows no evidence of CHF.  Does have evidence of pedal edema.  Patient given Lasix  and get response to this.  No concern for DVT.  BNP normal.  Troponin negative.  Patient struck to take her Lasix  as directed.        Final Clinical Impression(s) / ED Diagnoses Final diagnoses:  None    Rx / DC Orders ED Discharge Orders     None         Lind Repine, MD 02/02/24 [phone]

## 2024-02-02 NOTE — Care Management (Signed)
 Transition of Care [facility]) - Emergency Department Mini Assessment   Patient Details  Name: Whitney [name] MRN: [id number] Date of Birth: [birth year]  [facility] CM/SW Contact:    Ronni Colace, RN Phone Number: 02/02/2024, 11:14 AM   Clinical Narrative:  Consult placed for DME, Patient has a walker under 70 years old, however its brakes are not working correctly. She obtained it at [facility] supply. She will have to call them to ask about fixing it. She states she does need some help around the home, told her that cleaning services etc are not covered by insurance. She was doing better after OP PT, however now she Is having more trouble. Inquired with Dr Leighton Punches regarding possible Home Health services. She would want an agency that works with her insurance  ED Mini Assessment: What brought you to the Emergency Department? : Swelling  Barriers to Discharge:  Otho Blitz not working  needs better brakes)        Interventions which prevented an admission or readmission: DME Provided, Home Health Consult or Services    Patient Contact and Communications        ,                 Admission diagnosis:  Cellulitis Patient Active Problem List   Diagnosis Date Noted   Aortic stenosis 12/17/2023   (HFpEF) heart failure with preserved ejection fraction (HCC) 07/22/2023   Iron deficiency anemia secondary to inadequate dietary iron intake 10/30/2022   BMI 50.0-59.9, adult (HCC) 10/30/2022   Obesity, Beginning BMI 59.14 10/30/2022   Dehydration 09/12/2022   Allergic rhinitis due to pollen 03/02/2022   Atherosclerotic heart disease of native coronary artery without angina pectoris 03/02/2022   Chronic venous stasis dermatitis 03/02/2022   Decreased estrogen level 03/02/2022   Long term (current) use of insulin  (HCC) 03/02/2022   Neuropathy 03/02/2022   Peripheral venous insufficiency 03/02/2022   Varicose veins of lower extremity with inflammation  03/02/2022   Plantar flexed metatarsal bone of left foot 06/26/2021   Plantar flexed metatarsal bone of right foot 06/26/2021   Diabetes mellitus (HCC) 06/20/2021   Vitamin D  deficiency 05/15/2021   Detrusor muscle hypertonia 11/04/2019   Disorder of mitral valve 11/04/2019   Hypertension associated with type 2 diabetes mellitus (HCC) 11/04/2019   Gastro-esophageal reflux disease without esophagitis 11/04/2019   Hay fever 11/04/2019   Pure hypercholesterolemia 11/04/2019   Type 2 diabetes mellitus with hyperglycemia (HCC) 11/04/2019   Hypokalemia 06/25/2019   History of cardiomyopathy 06/25/2019   Pain due to onychomycosis of toenails of both feet 03/18/2019   Porokeratosis 03/18/2019   Acute right lumbar radiculopathy 07/25/2017   At risk for adverse drug event 07/16/2017   Humerus fracture 07/12/2017   Morbid obesity due to excess calories (HCC) 12/14/2015   Chronic systolic heart failure (HCC) 12/14/2015   Chest pain 12/14/2015   Angina decubitus (HCC) 12/14/2015   Class 3 severe obesity with serious comorbidity and body mass index (BMI) of 50.0 to 59.9 in adult 01/06/2013   Arthritis of knee, degenerative 01/06/2013   Osteoarthritis of both knees 01/06/2013   OSA (obstructive sleep apnea) 11/30/2011   Cardiac arrest (HCC) 11/13/2011   Diabetes mellitus type 2, uncontrolled 11/13/2011   PCP:  Lanae Pinal, MD Pharmacy:   [id number] [place], [place] - [address] AT [facility] [address] [place] [address] Phone: [phone] Fax: [phone]

## 2024-02-02 NOTE — Discharge Instructions (Signed)
 Take your Lasix  as directed and follow-up with your doctor as needed

## 2024-02-02 NOTE — ED Triage Notes (Signed)
 Pt BIB [facility] from home. Pt c/o leg swelling that started last night. Pt reports being unable to walk due to swelling. Pt reports being on a long car ride yesterday.  Hx Diabetic type 1 18RR 164 palpated 98% RA 64HR 275 CBG

## 2024-02-05 ENCOUNTER — Other Ambulatory Visit (HOSPITAL_COMMUNITY): Payer: PPO

## 2024-02-06 DIAGNOSIS — I872 Venous insufficiency (chronic) (peripheral): Secondary | ICD-10-CM | POA: Diagnosis not present

## 2024-02-06 DIAGNOSIS — E1165 Type 2 diabetes mellitus with hyperglycemia: Secondary | ICD-10-CM | POA: Diagnosis not present

## 2024-02-06 DIAGNOSIS — I10 Essential (primary) hypertension: Secondary | ICD-10-CM | POA: Diagnosis not present

## 2024-02-13 DIAGNOSIS — I872 Venous insufficiency (chronic) (peripheral): Secondary | ICD-10-CM | POA: Diagnosis not present

## 2024-02-13 DIAGNOSIS — I10 Essential (primary) hypertension: Secondary | ICD-10-CM | POA: Diagnosis not present

## 2024-02-20 DIAGNOSIS — I872 Venous insufficiency (chronic) (peripheral): Secondary | ICD-10-CM | POA: Diagnosis not present

## 2024-02-20 DIAGNOSIS — I10 Essential (primary) hypertension: Secondary | ICD-10-CM | POA: Diagnosis not present

## 2024-02-21 ENCOUNTER — Other Ambulatory Visit

## 2024-03-13 ENCOUNTER — Ambulatory Visit: Admitting: Podiatry

## 2024-03-20 ENCOUNTER — Ambulatory Visit: Admitting: Podiatry

## 2024-04-20 DIAGNOSIS — M79672 Pain in left foot: Secondary | ICD-10-CM | POA: Diagnosis not present

## 2024-04-20 DIAGNOSIS — L089 Local infection of the skin and subcutaneous tissue, unspecified: Secondary | ICD-10-CM | POA: Diagnosis not present

## 2024-04-20 DIAGNOSIS — R600 Localized edema: Secondary | ICD-10-CM | POA: Diagnosis not present

## 2024-04-20 DIAGNOSIS — I10 Essential (primary) hypertension: Secondary | ICD-10-CM | POA: Diagnosis not present

## 2024-04-20 DIAGNOSIS — I872 Venous insufficiency (chronic) (peripheral): Secondary | ICD-10-CM | POA: Diagnosis not present

## 2024-04-21 ENCOUNTER — Other Ambulatory Visit (HOSPITAL_COMMUNITY): Payer: Self-pay | Admitting: Family Medicine

## 2024-04-21 ENCOUNTER — Ambulatory Visit (HOSPITAL_COMMUNITY)
Admission: RE | Admit: 2024-04-21 | Discharge: 2024-04-21 | Disposition: A | Discharge status: Home / Self Care | Source: Ambulatory Visit | Attending: Vascular Surgery | Admitting: Vascular Surgery

## 2024-04-21 DIAGNOSIS — R609 Edema, unspecified: Secondary | ICD-10-CM | POA: Insufficient documentation

## 2024-04-23 ENCOUNTER — Ambulatory Visit

## 2024-04-23 DIAGNOSIS — M2141 Flat foot [pes planus] (acquired), right foot: Secondary | ICD-10-CM

## 2024-04-23 DIAGNOSIS — E1165 Type 2 diabetes mellitus with hyperglycemia: Secondary | ICD-10-CM

## 2024-04-23 NOTE — Progress Notes (Signed)
 Patient was here to try on shoes but we can not dispense  Prior auth expired 7/1 & [employer] ppw expired 6/14  Patient was called couple times to get in and could not get a hold of her  Shoes fit well provide overall support and protection to BIL feet  Inserts 3pr custom made provide support and protection to plantar surface of BIL feet  New ppw sent to Dr and Prior shara request sent to HTA once both are recvd then patient can pu shoes No appt necessary will need to sign ppw attached to bag  Shoes are in cupboard   Lolita Schultze CPed, CFo, CFm

## 2024-04-27 DIAGNOSIS — I5032 Chronic diastolic (congestive) heart failure: Secondary | ICD-10-CM | POA: Diagnosis not present

## 2024-04-28 ENCOUNTER — Telehealth: Payer: Self-pay

## 2024-04-28 NOTE — Telephone Encounter (Signed)
 Ppw rcvd expires 07/25/24  Faxed to [facility] 04/28/24

## 2024-04-29 ENCOUNTER — Ambulatory Visit: Admitting: Podiatry

## 2024-04-29 ENCOUNTER — Ambulatory Visit (INDEPENDENT_AMBULATORY_CARE_PROVIDER_SITE_OTHER): Admitting: Podiatry

## 2024-04-29 DIAGNOSIS — M7751 Other enthesopathy of right foot: Secondary | ICD-10-CM

## 2024-04-29 DIAGNOSIS — E1165 Type 2 diabetes mellitus with hyperglycemia: Secondary | ICD-10-CM

## 2024-04-29 DIAGNOSIS — M7752 Other enthesopathy of left foot: Secondary | ICD-10-CM

## 2024-04-29 DIAGNOSIS — M216X2 Other acquired deformities of left foot: Secondary | ICD-10-CM

## 2024-04-29 DIAGNOSIS — M216X1 Other acquired deformities of right foot: Secondary | ICD-10-CM

## 2024-04-29 DIAGNOSIS — M2142 Flat foot [pes planus] (acquired), left foot: Secondary | ICD-10-CM | POA: Diagnosis not present

## 2024-04-29 DIAGNOSIS — M2141 Flat foot [pes planus] (acquired), right foot: Secondary | ICD-10-CM

## 2024-04-29 NOTE — Progress Notes (Signed)
 Patient recvd shoes today no need to try on  Adding charges today  New auth and ppw recvd from DTD  Whitney [name] CPed, CFo, CFm

## 2024-04-29 NOTE — Progress Notes (Signed)
 Subjective:  Patient ID: Whitney [name], female    DOB: 09-07-1956,  MRN: [id number]  Chief Complaint  Patient presents with   Nail Problem    68 y.o. female presents with the above complaint. Patient presents with bilateral ankle pain that has been there for quite some time is progressive gotten worse worse with ambulation or shoe pressure Bilateral ankle capsulitis injection. She would like to discuss treatment options for that.  Hurts with ambulation pain scale 7 out of 10 dull aching nature   Review of Systems: Negative except as noted in the HPI. Denies N/V/F/Ch.  Past Medical History:  Diagnosis Date   Arthritis    left knee,right hip   Back pain    Cardiac arrest (HCC)    CHF (congestive heart failure) (HCC)    Constipation    Depression    Diabetes mellitus    GERD (gastroesophageal reflux disease)    Glaucoma    Glaucoma    H/O cardiac arrest 11/2011   PEA   Heart murmur    History of nuclear stress test    Lexiscan  MPI 01/09/24: no ischemia or infarction, EF 63; low risk   Hyperlipidemia    Hypertension    Joint pain    Myocardial infarction [facility]) 2013   Palpitations    Sleep apnea    Bring machine,mask and tubing   Swelling     Current Outpatient Medications:    amLODipine  (NORVASC ) 5 MG tablet, Take 1 tablet (5 mg total) by mouth daily., Disp: 90 tablet, Rfl: 3   aspirin  EC 81 MG tablet, Take 1 tablet (81 mg total) by mouth daily. Swallow whole., Disp: 90 tablet, Rfl: 3   atorvastatin  (LIPITOR) 40 MG tablet, Take 1 tablet (40 mg total) by mouth daily., Disp: 90 tablet, Rfl: 3   BD INSULIN  SYRINGE U/F 31G X 5/16 0.5 ML MISC, as directed. , Disp: , Rfl:    blood glucose meter kit and supplies KIT, Dispense based on patient and insurance preference. Use up to four times daily as directed. (FOR ICD-9 250.00, 250.01)., Disp: 1 each, Rfl: 0   cyclobenzaprine  (FLEXERIL ) 5 MG tablet, Take 1 tablet (5 mg total) by mouth every 8 (eight) hours as needed for muscle  spasms., Disp: 30 tablet, Rfl: 0   furosemide  (LASIX ) 40 MG tablet, TAKE 1 TABLET BY MOUTH EVERY DAY, Disp: 90 tablet, Rfl: 3   gabapentin  (NEURONTIN ) 300 MG capsule, Take 300 mg by mouth See admin instructions. Take 300 mg by mouth two times a day and an additional 300 mg once daily as needed for nerve pain, Disp: , Rfl:    Lancets (ONETOUCH DELICA PLUS LANCET33G) MISC, USE TO TEST YOUR BLOOD SUGAR 2 TIMES A DAY, Disp: , Rfl:    lisinopril  (ZESTRIL ) 40 MG tablet, Take 1 tablet (40 mg total) by mouth daily., Disp: 90 tablet, Rfl: 1   loratadine (CLARITIN) 10 MG tablet, Take 10 mg by mouth daily., Disp: , Rfl:    metoprolol  succinate (TOPROL -XL) 100 MG 24 hr tablet, Take 1 tablet (100 mg total) by mouth daily before breakfast. Take with or immediately following a meal., Disp: 30 tablet, Rfl: 0   mometasone (ELOCON) 0.1 % ointment, PLEASE SEE ATTACHED FOR DETAILED DIRECTIONS, Disp: , Rfl:    NOVOLIN N 100 UNIT/ML injection, Inject 35 Units into the skin 2 (two) times daily after a meal. 35 units in the morning and 15 units at night time., Disp: , Rfl: 4  ONETOUCH ULTRA test strip, 2 (two) times daily., Disp: , Rfl:    oxybutynin  (DITROPAN ) 5 MG tablet, Take 1 tablet (5 mg total) by mouth daily., Disp: 30 tablet, Rfl: 0   pantoprazole  (PROTONIX ) 40 MG tablet, Take 1 tablet (40 mg total) by mouth daily., Disp: 30 tablet, Rfl: 0   spironolactone  (ALDACTONE ) 25 MG tablet, Take 1 tablet (25 mg total) by mouth daily., Disp: 90 tablet, Rfl: 3   traMADol  (ULTRAM ) 50 MG tablet, Take 2 tablets (100 mg total) by mouth every 8 (eight) hours as needed for severe pain., Disp: 30 tablet, Rfl: 0   Vitamin D , Ergocalciferol , (DRISDOL ) 1.25 MG (50000 UNIT) CAPS capsule, Take 1 capsule (50,000 Units total) by mouth every 7 (seven) days., Disp: 4 capsule, Rfl: 0  Social History   Tobacco Use  Smoking Status Never  Smokeless Tobacco Never    Allergies  Allergen Reactions   Contrast Media [Iodinated Contrast  Media] Anaphylaxis   Empagliflozin Other (See Comments)   Metformin Other (See Comments)   Objective:  There were no vitals filed for this visit. There is no height or weight on file to calculate BMI. Constitutional Well developed. Well nourished.  Vascular Dorsalis pedis pulses palpable bilaterally. Posterior tibial pulses palpable bilaterally. Capillary refill normal to all digits.  No cyanosis or clubbing noted. Pedal hair growth normal.  Neurologic Normal speech. Oriented to person, place, and time. Epicritic sensation to light touch grossly present bilaterally.  Dermatologic Nails well groomed and normal in appearance. No open wounds. No skin lesions.  Orthopedic: Bilateral ankle pain noted pain with range of motion of the ankle joint no deep intra-articular pain noted no crepitus noted pain no pain at the Achilles tendon peroneal tendon ATFL ligament noted.   Radiographs: None Assessment:   1. Uncontrolled type 2 diabetes mellitus with hyperglycemia (HCC)   2. Pes planus of both feet   3. Plantar flexed metatarsal bone of left foot   4. Plantar flexed metatarsal bone of right foot   5. Capsulitis of ankle, right   6. Capsulitis of ankle, left    Plan:  Patient was evaluated and treated and all questions answered.  Bilateral ankle capsulitis - All questions and concerns were discussed with the patient in extensive detail given the amount of pain that she experiences she would benefit from a steroid injection to help decrease acute inflammatory component associated pain.  Patient agrees with plan like to proceed with steroid injection.  Hammertoe bilateral with underlying diabetes - Given the presence of hammertoe patient will benefit from diabetic shoes.  Diabetic shoes were already completed and were dispensed they were fitting well no acute complication.  No follow-ups on file.    Diabetic shoes were dispensed

## 2024-05-06 ENCOUNTER — Telehealth: Payer: Self-pay

## 2024-05-06 DIAGNOSIS — E1165 Type 2 diabetes mellitus with hyperglycemia: Secondary | ICD-10-CM

## 2024-05-06 DIAGNOSIS — M2142 Flat foot [pes planus] (acquired), left foot: Secondary | ICD-10-CM | POA: Diagnosis not present

## 2024-05-06 DIAGNOSIS — M2141 Flat foot [pes planus] (acquired), right foot: Secondary | ICD-10-CM | POA: Diagnosis not present

## 2024-05-06 NOTE — Telephone Encounter (Signed)
 [facility] PA received and approved  Auth# [id number]  04/23/24-07/22/24  Indexed in media

## 2024-05-12 DIAGNOSIS — I5022 Chronic systolic (congestive) heart failure: Secondary | ICD-10-CM | POA: Diagnosis not present

## 2024-05-12 DIAGNOSIS — E7800 Pure hypercholesterolemia, unspecified: Secondary | ICD-10-CM | POA: Diagnosis not present

## 2024-05-12 DIAGNOSIS — R600 Localized edema: Secondary | ICD-10-CM | POA: Diagnosis not present

## 2024-05-12 DIAGNOSIS — E1159 Type 2 diabetes mellitus with other circulatory complications: Secondary | ICD-10-CM | POA: Diagnosis not present

## 2024-05-12 DIAGNOSIS — I872 Venous insufficiency (chronic) (peripheral): Secondary | ICD-10-CM | POA: Diagnosis not present

## 2024-05-26 NOTE — Progress Notes (Signed)
 Patient picked up shoes 05/06/24  Whitney [name] CPed, CFo, CFm

## 2024-05-29 DIAGNOSIS — E119 Type 2 diabetes mellitus without complications: Secondary | ICD-10-CM | POA: Diagnosis not present

## 2024-06-15 DIAGNOSIS — R600 Localized edema: Secondary | ICD-10-CM | POA: Diagnosis not present

## 2024-06-15 DIAGNOSIS — I5022 Chronic systolic (congestive) heart failure: Secondary | ICD-10-CM | POA: Diagnosis not present

## 2024-06-15 DIAGNOSIS — I872 Venous insufficiency (chronic) (peripheral): Secondary | ICD-10-CM | POA: Diagnosis not present

## 2024-06-15 DIAGNOSIS — E1159 Type 2 diabetes mellitus with other circulatory complications: Secondary | ICD-10-CM | POA: Diagnosis not present

## 2024-06-15 DIAGNOSIS — Z23 Encounter for immunization: Secondary | ICD-10-CM | POA: Diagnosis not present

## 2024-06-30 DIAGNOSIS — E2839 Other primary ovarian failure: Secondary | ICD-10-CM | POA: Diagnosis not present

## 2024-06-30 DIAGNOSIS — I5022 Chronic systolic (congestive) heart failure: Secondary | ICD-10-CM | POA: Diagnosis not present

## 2024-06-30 DIAGNOSIS — Z23 Encounter for immunization: Secondary | ICD-10-CM | POA: Diagnosis not present

## 2024-06-30 DIAGNOSIS — Z0000 Encounter for general adult medical examination without abnormal findings: Secondary | ICD-10-CM | POA: Diagnosis not present

## 2024-06-30 DIAGNOSIS — I872 Venous insufficiency (chronic) (peripheral): Secondary | ICD-10-CM | POA: Diagnosis not present

## 2024-06-30 DIAGNOSIS — E1159 Type 2 diabetes mellitus with other circulatory complications: Secondary | ICD-10-CM | POA: Diagnosis not present

## 2024-06-30 DIAGNOSIS — N1831 Chronic kidney disease, stage 3a: Secondary | ICD-10-CM | POA: Diagnosis not present

## 2024-06-30 DIAGNOSIS — E7800 Pure hypercholesterolemia, unspecified: Secondary | ICD-10-CM | POA: Diagnosis not present

## 2024-06-30 DIAGNOSIS — R600 Localized edema: Secondary | ICD-10-CM | POA: Diagnosis not present

## 2024-06-30 DIAGNOSIS — D649 Anemia, unspecified: Secondary | ICD-10-CM | POA: Diagnosis not present

## 2024-06-30 DIAGNOSIS — Z1331 Encounter for screening for depression: Secondary | ICD-10-CM | POA: Diagnosis not present

## 2024-07-01 ENCOUNTER — Other Ambulatory Visit (HOSPITAL_BASED_OUTPATIENT_CLINIC_OR_DEPARTMENT_OTHER): Payer: Self-pay | Admitting: Family Medicine

## 2024-07-01 DIAGNOSIS — E2839 Other primary ovarian failure: Secondary | ICD-10-CM

## 2024-07-17 DIAGNOSIS — I5022 Chronic systolic (congestive) heart failure: Secondary | ICD-10-CM | POA: Diagnosis not present

## 2024-07-17 DIAGNOSIS — R600 Localized edema: Secondary | ICD-10-CM | POA: Diagnosis not present

## 2024-07-17 DIAGNOSIS — I872 Venous insufficiency (chronic) (peripheral): Secondary | ICD-10-CM | POA: Diagnosis not present

## 2024-08-15 ENCOUNTER — Other Ambulatory Visit: Payer: Self-pay | Admitting: Cardiology

## 2024-10-08 NOTE — Progress Notes (Signed)
 "     Cardiology Office Note Date:  10/09/2024  ID:  Whitney [name], DOB 1956-03-25, MRN [id number] PCP:  Regino Slater, MD  Cardiologist:  Joelle VEAR Ren Donley, MD  Chief Complaint  Patient presents with   Cardiomyopathy     Problems HFimprovedEF (Non-ischemic) TTE 2/13: 30-35% in setting of cardiac arrest TTE 3/23: 50-55%, mild LVH, mild AS Lexiscan  4/25: low risk TTE 3/25: 45-50%, GH, mod LVH, mild AS PEA 2/2 respiratory arrest 2013 Statin myopathy IDDM On trulicity No empagliflozin due to recurrent yeast infections OSA on CPAP Morbid obesity M: AE5, ASA81, AN40, FE40, LL40, XL100, Insulin , SE25  Visits  2/25: CP --> TTE, SPECT stress test, BNP 592 1/26: LP, HA1C, AE to 10, FE to 40 in AM and 20 in PM   Discussed the use of AI scribe software for clinical note transcription with the patient, who gave verbal consent to proceed.  History of Present Illness   Whitney [name] is a 69 year old female with hypertension and diabetes who presents with leg swelling and pain. She reports chronic leg swelling with pain that has been present for a long time. The legs sometimes weep fluid and become sore. She wears two pairs of pantyhose for support but is unable to manage medical compression stockings because she lives alone. She sometimes skips her diuretic on Sundays due to church. She had chest pain in the past that has resolved. She still occasionally feels chest tightness but not severe enough for her to seek emergency care. She denies significant shortness of breath at rest but has mild exertional dyspnea with activities like cleaning or walking. She notes that her blood pressure is sometimes high, which she attributes to leg pain and anxiety during visits. She has a home blood pressure cuff but needs her sisters help to use it. She uses Trulicity for diabetes but has been inconsistent with refills. She previously used Jardiance but stopped due to recurrent yeast infections. She lives  alone since her husband passed away, which limits her support for medication management and compression use.      ROS: Please see the history of present illness. All other systems are reviewed and negative.    PHYSICAL EXAM: VS:  BP (!) 160/91   Pulse 69   Ht 5' 1 (1.549 m)   Wt 290 lb (131.5 kg) Comment: patient reported (unable to obtain due to pt being in wheelchair)  SpO2 94%   BMI 54.80 kg/m  , BMI Body mass index is 54.8 kg/m. GEN: Well nourished, well developed, in no acute distress HEENT: normal Neck: no JVD, carotid bruits, or masses Cardiac: RRR; no murmurs, rubs, or gallops,no edema  Respiratory:  CTAB bilaterally, normal work of breathing GI: soft, nontender, nondistended, + BS Extremities: No LE edema Skin: warm and dry, no rash Neuro:  Strength and sensation are intact  Recent Labs: Reviewed  Studies: Reviewed  ASSESSMENT AND PLAN: Whitney [name] is a 69 y.o. female who presents for follow up.     Venous insufficiency with stasis dermatitis Chronic venous insufficiency with stasis dermatitis, leg swelling, and weeping. No open sores. Compression stockings difficult to use. - Increased Lasix  to 40 mg in the morning and 20 mg in the evening. - Prescribed 20 mg Lasix  tablets for evening dose.  Essential hypertension Blood pressure elevated, likely due to pain and anxiety. Current regimen includes amlodipine  and Lasix . Addressed dizziness concerns with increased dosage. - Increased amlodipine  from 5 mg to 10  mg daily. - Instructed to monitor blood pressure at home with family assistance. - Rechecked blood pressure during the visit.  Type 2 diabetes mellitus Managed by primary care. On Trulicity with inconsistent adherence. Previous issues with Jardiance due to recurrent yeast infections. - Ensure consistent use of Trulicity every Wednesday. - Ordered A1c test to assess diabetes control.  Obstructive sleep apnea Non-compliance with CPAP due to need for a new  nozzle.  Chest pain, resolved Previous chest pain resolved. No further testing required. - Ordered cholesterol check to assess cardiovascular risk.      Signed, Joelle VEAR Ren Donley, MD  10/09/2024 2:51 PM    [facility] "

## 2024-10-09 ENCOUNTER — Ambulatory Visit

## 2024-10-09 VITALS — BP 136/61 | HR 69 | Ht 61.0 in | Wt 290.0 lb

## 2024-10-09 DIAGNOSIS — E118 Type 2 diabetes mellitus with unspecified complications: Secondary | ICD-10-CM

## 2024-10-09 DIAGNOSIS — R600 Localized edema: Secondary | ICD-10-CM | POA: Diagnosis not present

## 2024-10-09 DIAGNOSIS — Z794 Long term (current) use of insulin: Secondary | ICD-10-CM

## 2024-10-09 DIAGNOSIS — I152 Hypertension secondary to endocrine disorders: Secondary | ICD-10-CM

## 2024-10-09 DIAGNOSIS — R079 Chest pain, unspecified: Secondary | ICD-10-CM

## 2024-10-09 DIAGNOSIS — I5020 Unspecified systolic (congestive) heart failure: Secondary | ICD-10-CM

## 2024-10-09 DIAGNOSIS — I5032 Chronic diastolic (congestive) heart failure: Secondary | ICD-10-CM

## 2024-10-09 DIAGNOSIS — G4733 Obstructive sleep apnea (adult) (pediatric): Secondary | ICD-10-CM

## 2024-10-09 DIAGNOSIS — I10 Essential (primary) hypertension: Secondary | ICD-10-CM

## 2024-10-09 DIAGNOSIS — E1159 Type 2 diabetes mellitus with other circulatory complications: Secondary | ICD-10-CM | POA: Diagnosis not present

## 2024-10-09 DIAGNOSIS — Z79899 Other long term (current) drug therapy: Secondary | ICD-10-CM | POA: Diagnosis not present

## 2024-10-09 DIAGNOSIS — I5023 Acute on chronic systolic (congestive) heart failure: Secondary | ICD-10-CM | POA: Diagnosis not present

## 2024-10-09 LAB — HEMOGLOBIN A1C
Est. average glucose Bld gHb Est-mCnc: 163 mg/dL
Hgb A1c MFr Bld: 7.3 % — ABNORMAL HIGH (ref 4.8–5.6)

## 2024-10-09 LAB — LIPID PANEL
Chol/HDL Ratio: 2.9 ratio (ref 0.0–4.4)
Cholesterol, Total: 147 mg/dL (ref 100–199)
HDL: 51 mg/dL
LDL Chol Calc (NIH): 80 mg/dL (ref 0–99)
Triglycerides: 85 mg/dL (ref 0–149)
VLDL Cholesterol Cal: 16 mg/dL (ref 5–40)

## 2024-10-09 MED ORDER — FUROSEMIDE 40 MG PO TABS: ORAL_TABLET | ORAL | Status: AC

## 2024-10-09 MED ORDER — AMLODIPINE BESYLATE 10 MG PO TABS: 10.0000 mg | ORAL_TABLET | Freq: Every day | ORAL | 3 refills | Status: AC

## 2024-10-09 MED ORDER — FUROSEMIDE 20 MG PO TABS: ORAL_TABLET | ORAL | 1 refills | Status: AC

## 2024-10-09 NOTE — Patient Instructions (Signed)
 Medication Instructions:  Start Lasix  20mg  every evening. Increase Amlodipine  10mg  daily. *If you need a refill on your cardiac medications before your next appointment, please call your pharmacy*  Lab Work: Today: A1C, Lipid Panel If you have labs (blood work) drawn today and your tests are completely normal, you will receive your results only by: MyChart Message (if you have MyChart) OR [name] in the mail If you have any lab test that is abnormal or we need to change your treatment, we will call you to review the results.  Testing/Procedures: None    Follow-Up: At [facility], you and your health needs are our priority.  As part of our continuing mission to provide you with exceptional heart care, our providers are all part of one team.  This team includes your primary Cardiologist (physician) and Advanced Practice Providers or APPs (Physician Assistants and Nurse Practitioners) who all work together to provide you with the care you need, when you need it.  Your next appointment:   3 months  Provider:   One of our Advanced Practice Providers (APPs): Morse Clause, PA-C  Lamarr Satterfield, NP Miriam Shams, NP  Olivia Pavy, PA-C Josefa Beauvais, NP  Leontine Salen, PA-C Orren Fabry, PA-C  [place], PA-C Ernest Dick, NP  Damien Braver, NP Jon Hails, PA-C  Waddell Donath, PA-C    Dayna Dunn, PA-C  Scott Weaver, PA-C Lum Louis, NP Katlyn West, NP Callie Goodrich, PA-C  Xika Zhao, NP Sheng Haley, PA-C    Kathleen Johnson, PA-C     We recommend signing up for the patient portal called MyChart.  Sign up information is provided on this After Visit Summary.  MyChart is used to connect with patients for Virtual Visits (Telemedicine).  Patients are able to view lab/test results, encounter notes, upcoming appointments, etc.  Non-urgent messages can be sent to your provider as well.   To learn more about what you can do with MyChart, go to [email or web address].    Other Instructions None

## 2024-10-10 ENCOUNTER — Ambulatory Visit: Payer: Self-pay

## 2025-01-07 ENCOUNTER — Ambulatory Visit: Admitting: Cardiology
